# Patient Record
Sex: Male | Born: 1995 | Race: Black or African American | Hispanic: No | Marital: Single | Smoking: Former smoker
Health system: Southern US, Community
[De-identification: ages and names within clinical notes are randomized; demographics above are authoritative.]

## PROBLEM LIST (undated history)

## (undated) DIAGNOSIS — F29 Unspecified psychosis not due to a substance or known physiological condition: Secondary | ICD-10-CM

## (undated) DIAGNOSIS — R569 Unspecified convulsions: Secondary | ICD-10-CM

## (undated) DIAGNOSIS — G43909 Migraine, unspecified, not intractable, without status migrainosus: Secondary | ICD-10-CM

## (undated) DIAGNOSIS — F209 Schizophrenia, unspecified: Secondary | ICD-10-CM

## (undated) HISTORY — PX: OTHER SURGICAL HISTORY: SHX169

## (undated) HISTORY — PX: NO PAST SURGERIES: SHX2092

---

## 2014-07-10 ENCOUNTER — Emergency Department (HOSPITAL_COMMUNITY)
Admission: EM | Admit: 2014-07-10 | Discharge: 2014-07-11 | Disposition: A | Discharge status: Home / Self Care | Payer: Medicaid Other | Attending: Emergency Medicine | Admitting: Emergency Medicine

## 2014-07-10 ENCOUNTER — Encounter (HOSPITAL_COMMUNITY): Payer: Self-pay | Admitting: Emergency Medicine

## 2014-07-10 DIAGNOSIS — R0600 Dyspnea, unspecified: Secondary | ICD-10-CM | POA: Insufficient documentation

## 2014-07-10 DIAGNOSIS — R0602 Shortness of breath: Secondary | ICD-10-CM | POA: Diagnosis present

## 2014-07-10 NOTE — ED Notes (Signed)
Pt states he was smoking weed and now feels anxious. States he has smoked marijuana multiple times before but has never felt this way before. Alert and oriented.

## 2014-07-10 NOTE — ED Notes (Signed)
Bed: [id number] Expected date: 07/10/14 Expected time: 11:06 PM Means of arrival: Ambulance Comments: Feels weird after smoking marijuana

## 2014-07-11 ENCOUNTER — Emergency Department (HOSPITAL_COMMUNITY): Payer: Medicaid Other

## 2014-07-11 NOTE — Discharge Instructions (Signed)
YOUR EXAM AND CHEST X-RAY ARE NORMAL. YOU CAN BE DISCHARGED HOME AND CAN FOLLOW UP WITH YOUR DOCTOR FOR FURTHER OUTPATIENT EVALUATION IF SYMPTOMS PERSIST.

## 2014-07-11 NOTE — ED Provider Notes (Signed)
CSN: [id number]     Arrival date & time 07/10/14  2317 History   First MD Initiated Contact with Patient 07/11/14 0108     Chief Complaint  Patient presents with  . Marijuana Reaction      (Consider location/radiation/quality/duration/timing/severity/associated sxs/prior Treatment) Patient is a 18 y.o. male presenting with shortness of breath. The history is provided by the patient. No language interpreter was used.  Shortness of Breath Associated symptoms: no fever   Associated symptoms comment:  He was smoking marijuana around 6:00 pm and reports feeling SOB afterward. No chest pain, cough or fever. He states marijuana has not caused these symptoms in the past. No vomiting.    History reviewed. No pertinent past medical history. History reviewed. No pertinent past surgical history. No family history on file. History  Substance Use Topics  . Smoking status: Not on file  . Smokeless tobacco: Not on file  . Alcohol Use: Not on file    Review of Systems  Constitutional: Negative for fever and chills.  HENT: Negative.   Respiratory: Positive for shortness of breath.   Cardiovascular: Negative.   Gastrointestinal: Negative.   Musculoskeletal: Negative.   Skin: Negative.   Neurological: Negative.       Allergies  Review of patient's allergies indicates no known allergies.  Home Medications   Prior to Admission medications   Not on File   BP 106/54 mmHg  Pulse 76  Temp(Src) 97.8 F (36.6 C) (Oral)  Resp 18  Ht 5\' 11"  (1.803 m)  Wt 136 lb (61.689 kg)  BMI 18.98 kg/m2  SpO2 100% Physical Exam  Constitutional: He is oriented to person, place, and time. He appears well-developed and well-nourished.  HENT:  Head: Normocephalic.  Neck: Normal range of motion. Neck supple.  Cardiovascular: Normal rate and regular rhythm.   Pulmonary/Chest: Effort normal and breath sounds normal.  Abdominal: Soft. Bowel sounds are normal. There is no tenderness. There is no rebound  and no guarding.  Musculoskeletal: Normal range of motion.  Neurological: He is alert and oriented to person, place, and time.  Skin: Skin is warm and dry. No rash noted.  Psychiatric: He has a normal mood and affect.    ED Course  Procedures (including critical care time) Labs Review Labs Reviewed - No data to display  Imaging Review Dg Chest 2 View  07/11/2014   CLINICAL DATA:  Short of breath after smoking marijuana  EXAM: CHEST  2 VIEW  COMPARISON:  None.  FINDINGS: The heart size and mediastinal contours are within normal limits. Both lungs are clear. The visualized skeletal structures are unremarkable.  IMPRESSION: No active cardiopulmonary disease.   Electronically Signed   By: Signa Kellaylor  Stroud M.D.   On: 07/11/2014 01:57     EKG Interpretation None      MDM   Final diagnoses:  None    1. Dyspnea  The patient is seen approximately 6 hours after onset of symptoms. He reports symptoms have remained the same and are unchanged. The patient had to be woken up to obtain history. His VS are normal, no tachycardia, tachypnea or hypoxia. He is appropriate for discharge.     Arnoldo HookerShari A Sabree Nuon, PA-C 07/11/14 [phone]  Tomasita CrumbleAdeleke Oni, MD 07/11/14 1200

## 2014-07-11 NOTE — ED Notes (Signed)
Patient transported to X-ray 

## 2015-03-06 ENCOUNTER — Emergency Department (HOSPITAL_COMMUNITY)
Admission: EM | Admit: 2015-03-06 | Discharge: 2015-03-06 | Disposition: A | Discharge status: Home / Self Care | Payer: Medicaid Other | Attending: Emergency Medicine | Admitting: Emergency Medicine

## 2015-03-06 ENCOUNTER — Emergency Department (HOSPITAL_COMMUNITY): Admission: EM | Admit: 2015-03-06 | Discharge: 2015-03-06 | Discharge status: Absent without leave | Payer: Self-pay

## 2015-03-06 ENCOUNTER — Encounter (HOSPITAL_COMMUNITY): Payer: Self-pay | Admitting: *Deleted

## 2015-03-06 ENCOUNTER — Emergency Department (HOSPITAL_COMMUNITY): Payer: Medicaid Other

## 2015-03-06 DIAGNOSIS — M79671 Pain in right foot: Secondary | ICD-10-CM | POA: Insufficient documentation

## 2015-03-06 MED ORDER — KETOROLAC TROMETHAMINE 30 MG/ML IJ SOLN
30.0000 mg | Freq: Once | INTRAMUSCULAR | Status: AC
  Administered 2015-03-06: 30 mg via INTRAMUSCULAR
  Filled 2015-03-06: qty 1

## 2015-03-06 MED ORDER — HYDROCODONE-ACETAMINOPHEN 5-325 MG PO TABS: 1.0000 | ORAL_TABLET | Freq: Four times a day (QID) | ORAL | Status: DC | PRN

## 2015-03-06 NOTE — ED Notes (Signed)
Pt reports running into a "jagged" curb with his R foot earlier today.  Reports pain on top of his R foot.  Swelling noted.

## 2015-03-06 NOTE — ED Notes (Signed)
Bed: WTR7 Expected date:  Expected time:  Means of arrival:  Comments: EMS foot pain

## 2015-03-06 NOTE — ED Provider Notes (Signed)
CSN: [id number]     Arrival date & time 03/06/15  0011 History   First MD Initiated Contact with Patient 03/06/15 0015     Chief Complaint  Patient presents with  . Foot Injury     (Consider location/radiation/quality/duration/timing/severity/associated sxs/prior Treatment) HPI Comments: Pt reports running into a "jagged" curb with his R foot earlier today. Reports pain on top of his R foot.he denies falling, hitting his head, loss of consciousness or vomiting. He states his pain is worsened with ambulating or palpation. He has not tried any medications prior to arrival her symptoms. No modifying factors identified. Denies any history of previous injuries or surgeries.   Patient is a 19 y.o. male presenting with lower extremity pain. The history is provided by the patient.  Foot Pain This is a new problem. The current episode started today. The problem occurs constantly. The problem has been unchanged. Associated symptoms include joint swelling and myalgias. Pertinent negatives include no fever. The symptoms are aggravated by walking. He has tried nothing for the symptoms. The treatment provided no relief.    History reviewed. No pertinent past medical history. History reviewed. No pertinent past surgical history. No family history on file. History  Substance Use Topics  . Smoking status: Never Smoker   . Smokeless tobacco: Not on file  . Alcohol Use: No    Review of Systems  Constitutional: Negative for fever.  Musculoskeletal: Positive for myalgias and joint swelling.       + R foot pain  All other systems reviewed and are negative.     Allergies  Review of patient's allergies indicates no known allergies.  Home Medications   Prior to Admission medications   Medication Sig Start Date End Date Taking? Authorizing Provider  HYDROcodone-acetaminophen (NORCO/VICODIN) 5-325 MG per tablet Take 1-2 tablets by mouth every 6 (six) hours as needed. 03/06/15   Jamear Carbonneau,  PA-C   BP 119/64 mmHg  Pulse 68  Temp(Src) 98.7 F (37.1 C) (Oral)  Resp 16  SpO2 100% Physical Exam  Constitutional: He is oriented to person, place, and time. He appears well-developed and well-nourished. No distress.  HENT:  Head: Normocephalic and atraumatic.  Right Ear: External ear normal.  Left Ear: External ear normal.  Nose: Nose normal.  Mouth/Throat: Oropharynx is clear and moist.  Eyes: Conjunctivae are normal.  Neck: Normal range of motion. Neck supple.  No nuchal rigidity.   Cardiovascular: Normal rate, regular rhythm, normal heart sounds and intact distal pulses.   Pulmonary/Chest: Effort normal.  Abdominal: Soft. There is no tenderness.  Musculoskeletal:       Right ankle: Tenderness.       Feet:  + R foot and ankle TTP and swelling. No erythema or warmth. No laceration or deformity noted. Neurovascularly intact distal to injury. Sensation grossly intact.  Neurological: He is alert and oriented to person, place, and time.  Skin: Skin is warm and dry. He is not diaphoretic.  Psychiatric: He has a normal mood and affect.  Nursing note and vitals reviewed.   ED Course  Procedures (including critical care time) Medications  ketorolac (TORADOL) 30 MG/ML injection 30 mg (30 mg Intramuscular Given 03/06/15 0114)    Labs Review Labs Reviewed - No data to display  Imaging Review Dg Foot Complete Right  03/06/2015   CLINICAL DATA:  Lateral pain and swelling, struck foot on concrete beam today.  EXAM: RIGHT FOOT COMPLETE - 3+ VIEW  COMPARISON:  None.  FINDINGS: There is no evidence  of fracture or dislocation. There is no evidence of arthropathy or other focal bone abnormality. Dorsal foot soft tissue swelling without subcutaneous gas or radiopaque foreign bodies.  IMPRESSION: Soft tissue swelling without acute osseous process.   Electronically Signed   By: Awilda Metroourtnay  Bloomer M.D.   On: 03/06/2015 01:22     EKG Interpretation None      MDM   Final diagnoses:    Right foot pain    Filed Vitals:   03/06/15 0013  BP: 119/64  Pulse: 68  Temp: 98.7 F (37.1 C)  Resp: 16   Patient X-Ray  negative for obvious fracture or dislocation. I personally reviewed the imaging and agree with the radiologist. Neurovascularly intact. Normal sensation. No evidence of compartment syndrome. Pain managed in ED. Pt advised to follow up with PCP if symptoms persist for possibility of missed fracture diagnosis. Patient given ace wrap and crutches while in ED, conservative therapy recommended and discussed. Patient will be dc home &  is agreeable with above plan.      Francee PiccoloJennifer Eliot Popper, PA-C 03/06/15 0303  Marisa Severinlga Otter, MD 03/06/15 [phone]

## 2015-03-06 NOTE — Discharge Instructions (Signed)
Please follow up with your primary care physician in 1-2 days. If you do not have one please call the [facility] number listed above. Please take pain medication and/or muscle relaxants as prescribed and as needed for pain. Please do not drive on narcotic pain medication or on muscle relaxants. Please read all discharge instructions and return precautions.  ° ° °Ankle Pain °Ankle pain is a common symptom. The bones, cartilage, tendons, and muscles of the ankle joint perform a lot of work each day. The ankle joint holds your body weight and allows you to move around. Ankle pain can occur on either side or back of 1 or both ankles. Ankle pain may be sharp and burning or dull and aching. There may be tenderness, stiffness, redness, or warmth around the ankle. The pain occurs more often when a person walks or puts pressure on the ankle. °CAUSES  °There are many reasons ankle pain can develop. It is important to work with your caregiver to identify the cause since many conditions can impact the bones, cartilage, muscles, and tendons. Causes for ankle pain include: °· Injury, including a break (fracture), sprain, or strain often due to a fall, sports, or a high-impact activity. °· Swelling (inflammation) of a tendon (tendonitis). °· Achilles tendon rupture. °· Ankle instability after repeated sprains and strains. °· Poor foot alignment. °· Pressure on a nerve (tarsal tunnel syndrome). °· Arthritis in the ankle or the lining of the ankle. °· Crystal formation in the ankle (gout or pseudogout). °DIAGNOSIS  °A diagnosis is based on your medical history, your symptoms, results of your physical exam, and results of diagnostic tests. Diagnostic tests may include X-ray exams or a computerized magnetic scan (magnetic resonance imaging, MRI). °TREATMENT  °Treatment will depend on the cause of your ankle pain and may include: °· Keeping pressure off the ankle and limiting activities. °· Using crutches or other  walking support (a cane or brace). °· Using rest, ice, compression, and elevation. °· Participating in physical therapy or home exercises. °· Wearing shoe inserts or special shoes. °· Losing weight. °· Taking medications to reduce pain or swelling or receiving an injection. °· Undergoing surgery. °HOME CARE INSTRUCTIONS  °· Only take over-the-counter or prescription medicines for pain, discomfort, or fever as directed by your caregiver. °· Put ice on the injured area. °¨ Put ice in a plastic bag. °¨ Place a towel between your skin and the bag. °¨ Leave the ice on for 15-20 minutes at a time, 03-04 times a day. °· Keep your leg raised (elevated) when possible to lessen swelling. °· Avoid activities that cause ankle pain. °· Follow specific exercises as directed by your caregiver. °· Record how often you have ankle pain, the location of the pain, and what it feels like. This information may be helpful to you and your caregiver. °· Ask your caregiver about returning to work or sports and whether you should drive. °· Follow up with your caregiver for further examination, therapy, or testing as directed. °SEEK MEDICAL CARE IF:  °· Pain or swelling continues or worsens beyond 1 week. °· You have an oral temperature above 102° F (38.9° C). °· You are feeling unwell or have chills. °· You are having an increasingly difficult time with walking. °· You have loss of sensation or other new symptoms. °· You have questions or concerns. °MAKE SURE YOU:  °· Understand these instructions. °· Will watch your condition. °· Will get help right away if you   are not doing well or get worse. °Document Released: 01/22/2010 Document Revised: 10/27/2011 Document Reviewed: 01/22/2010 °ExitCare® Patient Information ©2015 [name]. This information is not intended to replace advice given to you by your health care provider. Make sure you discuss any questions you have with your health care provider. ° ° ° °

## 2015-04-10 ENCOUNTER — Emergency Department (HOSPITAL_COMMUNITY)
Admission: EM | Admit: 2015-04-10 | Discharge: 2015-04-10 | Disposition: A | Discharge status: Home / Self Care | Payer: Medicaid Other | Attending: Emergency Medicine | Admitting: Emergency Medicine

## 2015-04-10 ENCOUNTER — Emergency Department (HOSPITAL_COMMUNITY): Payer: Medicaid Other

## 2015-04-10 ENCOUNTER — Encounter (HOSPITAL_COMMUNITY): Payer: Self-pay | Admitting: Emergency Medicine

## 2015-04-10 DIAGNOSIS — F458 Other somatoform disorders: Secondary | ICD-10-CM | POA: Insufficient documentation

## 2015-04-10 HISTORY — DX: Unspecified convulsions: R56.9

## 2015-04-10 MED ORDER — DIAZEPAM 5 MG PO TABS
5.0000 mg | ORAL_TABLET | Freq: Once | ORAL | Status: AC
  Administered 2015-04-10: 5 mg via ORAL
  Filled 2015-04-10: qty 1

## 2015-04-10 MED ORDER — LIDOCAINE VISCOUS 2 % MT SOLN
5.0000 mL | Freq: Once | OROMUCOSAL | Status: AC
  Administered 2015-04-10: 5 mL via OROMUCOSAL
  Filled 2015-04-10: qty 15

## 2015-04-10 NOTE — Discharge Instructions (Signed)
Globus Syndrome  Globus Syndrome is a feeling of a lump or a sensation of something caught in your throat. Eating food or drinking fluids does not seem to get rid of it. Yet it is not noticeable during the actual act of swallowing food or liquids. Usually there is nothing physically wrong. It is troublesome because it is an unpleasant sensation which is sometimes difficult to ignore and at times may seem to worsen. The syndrome is quite common. It is estimated 45% of the population experiences features of the condition at some stage during their lives. The symptoms are usually temporary. The largest group of people who feel the need to seek medical treatment is females between the ages of 30 to 60.   CAUSES   Globus Syndrome appears to be triggered by or aggravated by stress, anxiety and depression.  · Tension related to stress could product abnormal muscle spasms in the esophagus which would account for the sensation of a lump or ball in your throat.  · Frequent swallowing or drying of the throat caused by anxiety or other strong emotions can also produce this uncomfortable sensation in your throat.  · Fear and sadness can be expressed by the body in many ways. For instance, if you had a relative with throat cancer you might become overly concerned about your own health and develop uncomfortable sensations in your throat.  · The reaction to a crisis or a trauma event in your life can take the form of a lump in your throat. It is as if you are indirectly saying you can not handle or "swallow" one more thing.  DIAGNOSIS   Usually your caregiver will know what is wrong by talking to you and examining you.  If the condition persists for several days, more testing may be done to make sure there is not another problem present. This is usually not the case.  TREATMENT   · Reassurance is often the best treatment available. Usually the problem leaves without treatment over several days.  · Sometimes anti-anxiety medications  may be prescribed.  · Counseling or talk therapy can also help with strong underlying emotions.  · Note that in most cases this is not something that keeps coming back and you should not be concerned or worried.  Document Released: 10/25/2003 Document Revised: 10/27/2011 Document Reviewed: 03/23/2008  ExitCare® Patient Information ©2015 [name]. This information is not intended to replace advice given to you by your health care provider. Make sure you discuss any questions you have with your health care provider.

## 2015-04-10 NOTE — ED Notes (Signed)
Bed: WLPT2 Expected date:  Expected time:  Means of arrival:  Comments: 12M Throat pain
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
## 2015-09-23 ENCOUNTER — Encounter (HOSPITAL_COMMUNITY): Payer: Self-pay | Admitting: Emergency Medicine

## 2015-09-23 ENCOUNTER — Emergency Department (HOSPITAL_COMMUNITY): Payer: Self-pay

## 2015-09-23 ENCOUNTER — Emergency Department (HOSPITAL_COMMUNITY)
Admission: EM | Admit: 2015-09-23 | Discharge: 2015-09-25 | Disposition: A | Discharge status: Other Acute Inpatient Hospital | Payer: Federal, State, Local not specified - Other

## 2015-09-23 DIAGNOSIS — R410 Disorientation, unspecified: Secondary | ICD-10-CM | POA: Insufficient documentation

## 2015-09-23 DIAGNOSIS — F22 Delusional disorders: Secondary | ICD-10-CM | POA: Insufficient documentation

## 2015-09-23 DIAGNOSIS — F209 Schizophrenia, unspecified: Secondary | ICD-10-CM

## 2015-09-23 DIAGNOSIS — F29 Unspecified psychosis not due to a substance or known physiological condition: Secondary | ICD-10-CM | POA: Insufficient documentation

## 2015-09-23 LAB — COMPREHENSIVE METABOLIC PANEL
ALT: 18 U/L (ref 17–63)
AST: 30 U/L (ref 15–41)
Albumin: 4.4 g/dL (ref 3.5–5.0)
Alkaline Phosphatase: 77 U/L (ref 38–126)
Anion gap: 9 (ref 5–15)
BUN: 32 mg/dL — AB (ref 6–20)
CHLORIDE: 108 mmol/L (ref 101–111)
CO2: 25 mmol/L (ref 22–32)
CREATININE: 0.94 mg/dL (ref 0.61–1.24)
Calcium: 9.6 mg/dL (ref 8.9–10.3)
GFR calc Af Amer: >60 mL/min (ref >60)
Glucose, Bld: 78 mg/dL (ref 65–99)
POTASSIUM: 4 mmol/L (ref 3.5–5.1)
SODIUM: 142 mmol/L (ref 135–145)
Total Bilirubin: 0.8 mg/dL (ref 0.3–1.2)
Total Protein: 7.8 g/dL (ref 6.5–8.1)

## 2015-09-23 LAB — CBC
HCT: 40.5 % (ref 39.0–52.0)
HEMOGLOBIN: 13.9 g/dL (ref 13.0–17.0)
MCH: 28.8 pg (ref 26.0–34.0)
MCHC: 34.3 g/dL (ref 30.0–36.0)
MCV: 83.9 fL (ref 78.0–100.0)
PLATELETS: 178 10*3/uL (ref 150–400)
RBC: 4.83 MIL/uL (ref 4.22–5.81)
RDW: 13.4 % (ref 11.5–15.5)
WBC: 5.2 10*3/uL (ref 4.0–10.5)

## 2015-09-23 LAB — RAPID URINE DRUG SCREEN, HOSP PERFORMED
AMPHETAMINES: NOT DETECTED
BENZODIAZEPINES: NOT DETECTED
Barbiturates: NOT DETECTED
Cocaine: NOT DETECTED
OPIATES: NOT DETECTED
TETRAHYDROCANNABINOL: NOT DETECTED

## 2015-09-23 LAB — ETHANOL

## 2015-09-23 LAB — ACETAMINOPHEN LEVEL: Acetaminophen (Tylenol), Serum: <10 ug/mL — ABNORMAL LOW (ref 10–30)

## 2015-09-23 LAB — SALICYLATE LEVEL
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
## 2015-09-23 NOTE — ED Notes (Signed)
Pt not able to follow directions.
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
## 2015-09-23 NOTE — ED Notes (Signed)
Pt acting abnormally. Right hand is edematous and erythematous. Pt is disoriented, poor attention span, obsessed with his belt and swollen hand, tells RN that his family member owns record companies. Denies SI/HI/AVH. Denies use of drugs. Repeatedly states "what the heck did they do to me?" Appears fascinated by edematous hand.

## 2015-09-23 NOTE — ED Notes (Signed)
TTS recommendation is inpatient treatment. [name] does not currently have beds, so TTS will seek placement elsewhere for pt

## 2015-09-23 NOTE — ED Notes (Signed)
TTS consult in process. 

## 2015-09-24 DIAGNOSIS — F209 Schizophrenia, unspecified: Secondary | ICD-10-CM | POA: Diagnosis not present

## 2015-09-24 MED ORDER — HALOPERIDOL 5 MG PO TABS
5.0000 mg | ORAL_TABLET | Freq: Two times a day (BID) | ORAL | Status: DC
  Administered 2015-09-24 – 2015-09-25 (×3): 5 mg via ORAL
  Filled 2015-09-24 (×3): qty 1

## 2015-09-24 MED ORDER — BENZTROPINE MESYLATE 1 MG PO TABS
1.0000 mg | ORAL_TABLET | Freq: Two times a day (BID) | ORAL | Status: DC
  Administered 2015-09-24 – 2015-09-25 (×3): 1 mg via ORAL
  Filled 2015-09-24 (×3): qty 1

## 2015-09-24 MED ORDER — OXCARBAZEPINE 300 MG PO TABS
300.0000 mg | ORAL_TABLET | Freq: Two times a day (BID) | ORAL | Status: DC
  Administered 2015-09-24 – 2015-09-25 (×3): 300 mg via ORAL
  Filled 2015-09-24 (×3): qty 1

## 2015-09-24 NOTE — Progress Notes (Signed)
CM spoke with pt who confirms uninsured [employer] resident with no pcp.  CM discussed and provided written information for uninsured accepting pcps, discussed the importance of pcp vs EDP services for f/u care, www.needymeds.org, www.goodrx.com, discounted pharmacies and other [employer] such as [employer] , [employer], affordable care act, financial assistance, uninsured dental services, [place] med assist, DSS and  health department  Reviewed resources for [employer] uninsured accepting pcps like Risk analyst, family medicine at [employer] [address] street, [facility], [facility], local urgent care centers, [facility], [facility] family practice, general medical clinics, family services of the [place], [facility] urgent care plus others, medication resources, [facility] out patient pharmacies and housing Pt voiced understanding and appreciation of resources provided   Provided [employer] contact information Encouraged use of IRC/urban ministries  Pt had not eaten his lunch Cm encouraged him to eat so his tray would not be taken away (limit of time food can remain sitting out) Pt sat up in bed to get his food to eat

## 2015-09-24 NOTE — Progress Notes (Signed)
Entered in d/c instructions Please use the resources provided to you in emergency room by case manager to assist with doctor for follow up These [place] uninsured resources provide possible primary care providers, resources for discounted medications, housing, dental resources, affordable care act information, plus other resources for [employer]  [name]) [address] [place], [place] [address] [phone] by 2 pm Monday-Friday & sign in at the front desk. Come if you need showers, laundry, computer access, job counseling, resume help, referrals for food/clothing, Mental Health assist, housing

## 2015-09-24 NOTE — ED Notes (Signed)
Pt awake, alert & responsive, no distress noted.  Eating dinner at present.  Monitoring for safety, Q 15 min checks in effect.

## 2015-09-24 NOTE — Progress Notes (Signed)
D Pt. Denies SI and HI, with a shake of his head,  Pt. Also denies pain or discomfort. Pt. Appeared puzzled when writer ask if he was hearing any voices or seeing anything. Pt. Did not respond to these questions even when explained and repeated. Pt. Only responded verbally to wanting food.  A Writer offered support and encouragement.  R Pt. Put the food in bed with him, he did not want staff to assist with opening it.  Pt. Has a strong smell of feces on his clothing that we locked up.  Pt.  Went to sleep soon after moving onto the unit.  Will continue to monitor to ensure pt. Safety.
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
## 2015-09-24 NOTE — BH Assessment (Signed)
[name] Assessment Progress Note  The following facilities have been contacted to seek placement for this pt, with results as noted:  Beds available, information sent, decision pending:  [facility]   At capacity:  [facility]   Sintia Mckissic, MA Triage Specialist [phone]     

## 2015-09-25 ENCOUNTER — Encounter (HOSPITAL_COMMUNITY): Payer: Self-pay

## 2015-09-25 ENCOUNTER — Inpatient Hospital Stay (HOSPITAL_COMMUNITY)
Admission: AD | Admit: 2015-09-25 | Discharge: 2015-10-02 | DRG: 885 | Disposition: A | Discharge status: Home / Self Care | Payer: Federal, State, Local not specified - Other | Source: Intra-hospital | Attending: Psychiatry | Admitting: Psychiatry

## 2015-09-25 DIAGNOSIS — R4183 Borderline intellectual functioning: Secondary | ICD-10-CM

## 2015-09-25 DIAGNOSIS — F200 Paranoid schizophrenia: Secondary | ICD-10-CM | POA: Diagnosis present

## 2015-09-25 DIAGNOSIS — F209 Schizophrenia, unspecified: Principal | ICD-10-CM | POA: Diagnosis present

## 2015-09-25 DIAGNOSIS — F411 Generalized anxiety disorder: Secondary | ICD-10-CM | POA: Diagnosis present

## 2015-09-25 DIAGNOSIS — F29 Unspecified psychosis not due to a substance or known physiological condition: Secondary | ICD-10-CM | POA: Diagnosis present

## 2015-09-25 DIAGNOSIS — Z8659 Personal history of other mental and behavioral disorders: Secondary | ICD-10-CM

## 2015-09-25 DIAGNOSIS — G4700 Insomnia, unspecified: Secondary | ICD-10-CM | POA: Diagnosis present

## 2015-09-25 DIAGNOSIS — Z590 Homelessness: Secondary | ICD-10-CM

## 2015-09-25 DIAGNOSIS — F1221 Cannabis dependence, in remission: Secondary | ICD-10-CM | POA: Clinically undetermined

## 2015-09-25 DIAGNOSIS — F4310 Post-traumatic stress disorder, unspecified: Secondary | ICD-10-CM | POA: Diagnosis present

## 2015-09-25 MED ORDER — MAGNESIUM HYDROXIDE 400 MG/5ML PO SUSP: 30.0000 mL | Freq: Every day | ORAL | Status: DC | PRN

## 2015-09-25 MED ORDER — BENZTROPINE MESYLATE 1 MG PO TABS
1.0000 mg | ORAL_TABLET | Freq: Two times a day (BID) | ORAL | Status: DC
  Administered 2015-09-25 – 2015-09-29 (×8): 1 mg via ORAL
  Filled 2015-09-25 (×14): qty 1

## 2015-09-25 MED ORDER — HALOPERIDOL 5 MG PO TABS
5.0000 mg | ORAL_TABLET | Freq: Two times a day (BID) | ORAL | Status: DC
  Administered 2015-09-25 – 2015-09-29 (×8): 5 mg via ORAL
  Filled 2015-09-25 (×14): qty 1

## 2015-09-25 MED ORDER — HYDROXYZINE HCL 25 MG PO TABS: 25.0000 mg | ORAL_TABLET | Freq: Three times a day (TID) | ORAL | Status: DC | PRN

## 2015-09-25 MED ORDER — OXCARBAZEPINE 300 MG PO TABS
300.0000 mg | ORAL_TABLET | Freq: Two times a day (BID) | ORAL | Status: DC
  Administered 2015-09-26 – 2015-10-02 (×13): 300 mg via ORAL
  Filled 2015-09-25 (×8): qty 1
  Filled 2015-09-25 (×2): qty 14
  Filled 2015-09-25 (×9): qty 1

## 2015-09-25 MED ORDER — ACETAMINOPHEN 325 MG PO TABS: 650.0000 mg | ORAL_TABLET | Freq: Four times a day (QID) | ORAL | Status: DC | PRN

## 2015-09-25 MED ORDER — ALUM & MAG HYDROXIDE-SIMETH 200-200-20 MG/5ML PO SUSP
30.0000 mL | ORAL | Status: DC | PRN
Start: 2015-09-25 — End: 2015-10-02

## 2015-09-25 MED ORDER — HYDROXYZINE HCL 25 MG PO TABS
25.0000 mg | ORAL_TABLET | Freq: Three times a day (TID) | ORAL | Status: DC | PRN
  Filled 2015-09-25: qty 10
  Filled 2015-09-25: qty 1

## 2015-09-25 NOTE — ED Notes (Signed)
[name]  Left the unit ambulatory with [employer].  All belongings given to the driver.  Patient is more oriented today and interacting a bit more.  Taking medications as prescribed.  Appetite is good.
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
## 2015-09-25 NOTE — Progress Notes (Signed)
D:Patient in bed on approach.  Patient states he was tired and wanted to sleep.  Patient denies SI/HI and denies AVH during assessment.  Patient states he is feeling a little better than he felt when he came in.  Patient did not engage in conversation with Clinical research associate. A: Staff to monitor Q 15 mins for safety.  Encouragement and support offered. No scheduled medications administered per orders. R: Patient remains safe on the unit.  Patient did not attend group tonight.  Patient not visible on the unit tonight.  Patient had not medications to be administered.

## 2015-09-25 NOTE — BH Assessment (Signed)
[name] Assessment Progress Note  Per Thedore Mins, MD, this pt requires psychiatric hospitalization at this time.  Rosey Bath, RN, [facility] has assigned pt to [facility] Rm 502-1.  Pt has signed Voluntary Admission and Consent for Treatment, as well as Consent to Release Information to no one, and signed forms have been faxed to [facility].  Pt's nurse has been notified, and agrees to send original paperwork along with pt via [place], and to call report to [phone].  Doylene Canning, MA Triage Specialist [phone]

## 2015-09-25 NOTE — Progress Notes (Signed)
Did not attend group 

## 2015-09-25 NOTE — Tx Team (Signed)
Initial Interdisciplinary Treatment Plan   PATIENT STRESSORS: Marital or family conflict Medication change or noncompliance homelessness   PATIENT STRENGTHS: Physical Health   PROBLEM LIST: Problem List/Patient Goals Date to be addressed Date deferred Reason deferred Estimated date of resolution  "I hear voices stating we got you now"      Patient has paranoid thoughts.                                                  DISCHARGE CRITERIA:  Improved stabilization in mood, thinking, and/or behavior Motivation to continue treatment in a less acute level of care  PRELIMINARY DISCHARGE PLAN: Placement in alternative living arrangements  PATIENT/FAMIILY INVOLVEMENT: This treatment plan has been presented to and reviewed with the patient, Collin White, and/or family member The patient and family have been given the opportunity to ask questions and make suggestions.  [name] 09/25/2015, 5:28 PM

## 2015-09-26 ENCOUNTER — Encounter (HOSPITAL_COMMUNITY): Payer: Self-pay | Admitting: Psychiatry

## 2015-09-26 DIAGNOSIS — F29 Unspecified psychosis not due to a substance or known physiological condition: Secondary | ICD-10-CM | POA: Diagnosis not present

## 2015-09-26 DIAGNOSIS — R4183 Borderline intellectual functioning: Secondary | ICD-10-CM | POA: Diagnosis not present

## 2015-09-26 DIAGNOSIS — F1290 Cannabis use, unspecified, uncomplicated: Secondary | ICD-10-CM | POA: Diagnosis not present

## 2015-09-26 DIAGNOSIS — Z8659 Personal history of other mental and behavioral disorders: Secondary | ICD-10-CM

## 2015-09-26 DIAGNOSIS — F209 Schizophrenia, unspecified: Secondary | ICD-10-CM | POA: Diagnosis not present

## 2015-09-26 DIAGNOSIS — F1221 Cannabis dependence, in remission: Secondary | ICD-10-CM | POA: Clinically undetermined
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
## 2015-09-26 NOTE — BHH Suicide Risk Assessment (Signed)
[name] Admission Suicide Risk Assessment   Nursing information obtained from:    Demographic factors:    Current Mental Status:    Loss Factors:    Historical Factors:    Risk Reduction Factors:     Total Time spent with patient: 30 minutes Principal Problem: Psychosis Diagnosis:   Patient Active Problem List   Diagnosis Date Noted  . Cannabis use disorder, moderate, in early remission [F12.90] 09/26/2015  . Psychosis [F29] 09/25/2015   Subjective Data: Please see H&P.   Continued Clinical Symptoms:  Alcohol Use Disorder Identification Test Final Score (AUDIT): 0 The "Alcohol Use Disorders Identification Test", Guidelines for Use in Primary Care, Second Edition.  World Science writer [facility] [employer]). Score between 0-7:  no or low risk or alcohol related problems. Score between 8-15:  moderate risk of alcohol related problems. Score between 16-19:  high risk of alcohol related problems. Score 20 or above:  warrants further diagnostic evaluation for alcohol dependence and treatment.   CLINICAL FACTORS:   Alcohol/Substance Abuse/Dependencies Previous Psychiatric Diagnoses and Treatments   Musculoskeletal: Strength & Muscle Tone: within normal limits Gait & Station: normal Patient leans: N/A  Psychiatric Specialty Exam: Review of Systems  Psychiatric/Behavioral: The patient is nervous/anxious.   All other systems reviewed and are negative.   Blood pressure 113/76, pulse 73, temperature 98.3 F (36.8 C), temperature source Oral, resp. rate 20, height 6' (1.829 m), weight 66.225 kg (146 lb), SpO2 100 %.Body mass index is 19.8 kg/(m^2).                      Please see H&P.                                   COGNITIVE FEATURES THAT CONTRIBUTE TO RISK:  Closed-mindedness, Polarized thinking and Thought constriction (tunnel vision)    SUICIDE RISK:   Mild:  Suicidal ideation of limited frequency, intensity, duration, and specificity.  There are no  identifiable plans, no associated intent, mild dysphoria and related symptoms, good self-control (both objective and subjective assessment), few other risk factors, and identifiable protective factors, including available and accessible social support.  PLAN OF CARE: Please see H&P.   I certify that inpatient services furnished can reasonably be expected to improve the patient's condition.   Irine Heminger, MD 09/26/2015, 11:55 AM

## 2015-09-26 NOTE — BHH Suicide Risk Assessment (Signed)
[name] INPATIENT:  Family/Significant Other Suicide Prevention Education  Suicide Prevention Education:  Patient Refusal for Family/Significant Other Suicide Prevention Education: The patient Collin White has refused to provide written consent for family/significant other to be provided Family/Significant Other Suicide Prevention Education during admission and/or prior to discharge.  Physician notified.  Jonathon Jordan 09/26/2015, 4:43 PM

## 2015-09-26 NOTE — Progress Notes (Signed)
Adult Psychoeducational Group Note  Date:  09/26/2015 Time:  9:12 PM  Group Topic/Focus:  Wrap-Up Group:   The focus of this group is to help patients review their daily goal of treatment and discuss progress on daily workbooks.  Participation Level:  Did Not Attend  Participation Quality:  Did not attend  Affect:  Did not attend  Cognitive:  Did not attend  Insight: None  Engagement in Group:  Did not attend  Modes of Intervention:  Did not attend  Additional Comments:  Patient did not attend wrap up group tonight.   Emric Kowalewski L Hildy Nicholl 09/26/2015, 9:12 PM
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
## 2015-09-26 NOTE — Tx Team (Signed)
Interdisciplinary Treatment Plan Update (Adult)  Date:  09/26/2015 Time Reviewed:  4:36 PM  Progress in Treatment: Attending groups: Yes. Participating in groups: Yes. Taking medication as prescribed:  Yes. Tolerating medication:  Yes. Family/Significant othe contact made:  Pt declined to sign release Patient understands diagnosis:  No, limited insight. Discussing patient identified problems/goals with staff:  Yes, see initial care plan. Medical problems stabilized or resolved:  Yes Denies suicidal/homicidal ideation: Yes. Issues/concerns per patient self-inventory: No. Other:  New problem(s) identified:   Discharge Plan or Barriers: See below   Reason for Continuation of Hospitalization: Delusions  Hallucinations Medication stabilization  Comments:  Patient was admitted due to delusional thinking, believing that people were injecting him with feces. Patient endorses positive auditory hallucinations and hears voices that state "we are going to get you" Patient also reports having moments of rage where he can not control himself and stated that those occurred even in childhood. Patient currently denies SI and HI and is able to contract for safety. Patient received and agreed to unit treatment plan agreement and was pleasant and cooperative on assessment. Patient quickly acclimated to unit.       Cogentin, Haldol, Trileptal trial   Estimated length of stay: 4-5 days  New goal(s):  Review of initial/current patient goals per problem list:  1. Goal(s): Patient will participate in aftercare plan  Met: No   Target date: at discharge  As evidenced by: Patient will participate within aftercare plan AEB aftercare provider and housing plan at discharge being identified. 09/26/15: Pt will follow-up outpt with [name] but housing is still unknown.  Declined to sign up for [facility] program when visited today.   4. Goal(s): Patient will demonstrate decreased signs of  psychosis.  Met: No  Target date:at discharge  As evidenced by: Patient will demonstrate decreased signs of psychosis as evidenced by a reduction in AVH, paranoia, and/or delusions.   09/26/15: Pt  Exhibits paranoia and  disorganized and delusional thinking.     Attendees: Patient:  09/26/2015 4:36 PM  Family:   09/26/2015 4:36 PM  Physician:  Dr. Ursula Alert, MD 09/26/2015 4:36 PM  Nursing:   Manuella Ghazi, RN 09/26/2015 4:36 PM  Case Manager:  Roque Lias, LCSW 09/26/2015 4:36 PM  Counselor:  Matthew Saras, MSW Intern 09/26/2015 4:36 PM  Other:   09/26/2015 4:36 PM  Other:   09/26/2015 4:36 PM  Other:   09/26/2015 4:36 PM  Other:  09/26/2015 4:36 PM  Other:    Other:    Other:    Other:    Other:    Other:      Scribe for Treatment Team:   Georga Kaufmann, MSW Intern 09/26/2015 4:36 PM

## 2015-09-26 NOTE — Progress Notes (Signed)
D. Pt presents with depressed mood, affect blunted. Collin White was not forthcoming of thoughts today with writer, giving minimal one word answers but he states his '' mood is ok '' and denies any SI/HI/A/V Hallucinations. He denied any acute concerns for writer to address with treatment team . He has been isolative to room with minimal interaction of staff or peers. A. Medications given as ordered. R. Pt is safe, in no acute distress at this time. Remains on q 15 minutes safety chk as ordered.

## 2015-09-26 NOTE — Progress Notes (Signed)
Patient ID: Collin White, male   DOB: 1995/12/30, 20 y.o.   MRN: [id number] D: Client in bed most of the shift, but up for snacks. Client reports "doing good, everything good" A: Writer provided emotional support, encouraged client to report any concerns and to attend group. Staff will monitor q59min for safety. R: Client is safe on the unit, did not attend group.

## 2015-09-26 NOTE — Plan of Care (Signed)
Problem: Ineffective individual coping Goal: LTG: Patient will report a decrease in negative feelings Outcome: Progressing Pt interacting some on coping , reports mood is improving.  Comments:  Pt denies any thoughts of harming self.

## 2015-09-26 NOTE — [facility] Group Notes (Signed)
[name] LCSW Group Therapy  09/26/2015 1:52 PM  Type of Therapy: Group Therapy  Participation Level: Active  Participation Quality: Attentive  Affect: Flat  Cognitive: Oriented  Insight: Limited  Engagement in Therapy: Engaged  Modes of Intervention: Discussion and Socialization  Summary of Progress/Problems: Onalee Hua from [employer] was here to tell his story of recovery and play his guitar. Pt nodded off to sleep a couple times but stayed for the entire group. Pt's comments were helpful to the toic and appropriate.   Vito Backers. Beverely Pace 09/26/2015 1:52 PM

## 2015-09-27 DIAGNOSIS — F29 Unspecified psychosis not due to a substance or known physiological condition: Secondary | ICD-10-CM | POA: Diagnosis not present

## 2015-09-27 DIAGNOSIS — F1290 Cannabis use, unspecified, uncomplicated: Secondary | ICD-10-CM | POA: Diagnosis not present

## 2015-09-27 DIAGNOSIS — R4183 Borderline intellectual functioning: Secondary | ICD-10-CM | POA: Diagnosis not present

## 2015-09-27 LAB — URINALYSIS, ROUTINE W REFLEX MICROSCOPIC
BILIRUBIN URINE: NEGATIVE
GLUCOSE, UA: NEGATIVE mg/dL
Hgb urine dipstick: NEGATIVE
KETONES UR: NEGATIVE mg/dL
LEUKOCYTES UA: NEGATIVE
NITRITE: NEGATIVE
PH: 5.5 (ref 5.0–8.0)
PROTEIN: NEGATIVE mg/dL
Specific Gravity, Urine: 1.034 — ABNORMAL HIGH (ref 1.005–1.030)

## 2015-09-27 LAB — TSH: TSH: 1.538 u[IU]/mL (ref 0.350–4.500)

## 2015-09-27 LAB — URINE MICROSCOPIC-ADD ON
RBC / HPF: NONE SEEN RBC/hpf (ref 0–5)
WBC, UA: NONE SEEN WBC/hpf (ref 0–5)

## 2015-09-27 LAB — LIPID PANEL
Cholesterol: 162 mg/dL (ref 0–200)
HDL: 60 mg/dL (ref >40)
LDL CALC: 76 mg/dL (ref 0–99)
TRIGLYCERIDES: 132 mg/dL (ref <150)
Total CHOL/HDL Ratio: 2.7 RATIO
VLDL: 26 mg/dL (ref 0–40)

## 2015-09-27 LAB — VITAMIN B12: Vitamin B-12: 532 pg/mL (ref 180–914)

## 2015-09-27 LAB — RPR: RPR: NONREACTIVE

## 2015-09-27 LAB — FOLATE: FOLATE: 6 ng/mL (ref >5.9)
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
## 2015-09-27 NOTE — Progress Notes (Signed)
DAR NOTE: Patient is calm and pleasant on approach.  Affect and mood is bright.  Tangential in speech.  Denies pain, auditory and visual hallucinations.  Rates depression at 0, hopelessness at 0, and anxiety at 0.  Describes energy level as normal and concentration as good.  Maintained on routine safety checks.  Medications given as prescribed.  Support and encouragement offered as needed.  Attended group and participated.   Patient observed socializing with peers in the dayroom.  Offered no complaint.

## 2015-09-27 NOTE — Progress Notes (Signed)
Patient ID: Collin White, male   DOB: Oct 01, 1995, 20 y.o.   MRN: [id number] D: Client visible on the unit, in dayroom watching TV. Client reports "I'm better now, when I first came in I couldn't talk fully, my hand was swollen (point to right hand), but it's now swollen now" "now I'm fine" A:Writer provided emotional support encouraged client to report any concerns to staff. Client refused medications for sleep. Staff will monitor q3min for safety. R: Client is safe on the unit, attended group.

## 2015-09-27 NOTE — [facility] Group Notes (Signed)
[name] Group Notes:  (Nursing/MHT/Case Management/Adjunct)  Date:  09/27/2015    Time:  0930 Type of Therapy:  Nurse Education  Participation Level:  Active  Participation Quality:  Appropriate and Attentive  Affect:  Appropriate  Cognitive:  Alert and Appropriate  Insight:  Appropriate and Good  Engagement in Group:  Engaged  Modes of Intervention:  Activity, Discussion, Education and Exploration  Summary of Progress/Problems: Topic was on leisure and lifestyle changes. Discussed the importance of choosing a healthy leisure activities. Group encouraged to surround themselves with positive and healthy group/support system when changing to a healthy lifestyle. Patient was receptive and contributed.    Mickie Bail 09/27/2015, 12:11 PM

## 2015-09-27 NOTE — Progress Notes (Signed)
Adult Psychoeducational Group Note  Date:  09/27/2015 Time:  8:35 PM  Group Topic/Focus:  Wrap-Up Group:   The focus of this group is to help patients review their daily goal of treatment and discuss progress on daily workbooks.  Participation Level:  Active  Participation Quality:  Appropriate  Affect:  Appropriate  Cognitive:  Alert  Insight: Appropriate  Engagement in Group:  Engaged  Modes of Intervention:  Discussion  Additional Comments:  Patient stated having a good day. Patient stated his hand is not swollen anymore. Patient stated something positive that happened today is "I am balanced now".  Monai Hindes L Avalyn Molino 09/27/2015, 8:35 PM
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
## 2015-09-28 ENCOUNTER — Inpatient Hospital Stay (HOSPITAL_COMMUNITY): Payer: Federal, State, Local not specified - Other

## 2015-09-28 DIAGNOSIS — R4183 Borderline intellectual functioning: Secondary | ICD-10-CM | POA: Diagnosis not present

## 2015-09-28 DIAGNOSIS — F29 Unspecified psychosis not due to a substance or known physiological condition: Secondary | ICD-10-CM | POA: Diagnosis not present

## 2015-09-28 DIAGNOSIS — F1290 Cannabis use, unspecified, uncomplicated: Secondary | ICD-10-CM | POA: Diagnosis not present

## 2015-09-28 LAB — HEMOGLOBIN A1C
Hgb A1c MFr Bld: 5.6 % (ref 4.8–5.6)
Mean Plasma Glucose: 114 mg/dL

## 2015-09-28 LAB — PROLACTIN: PROLACTIN: 32.6 ng/mL — AB (ref 4.0–15.2)

## 2015-09-28 NOTE — [facility] Group Notes (Signed)
[name] LCSW Group Therapy  09/28/2015  1:05 PM  Type of Therapy:  Group therapy  Participation Level:  Active  Participation Quality:  Attentive  Affect:  Flat  Cognitive:  Oriented  Insight:  Limited  Engagement in Therapy:  Limited  Modes of Intervention:  Discussion, Socialization  Summary of Progress/Problems:  Chaplain was here to lead a group on themes of hope and courage."Hope is taking a step with your right foot, and then your left."  Continued to expand on this theme, which was impossible to follow.  When asked what his hope is nbo, he replied that he wants to see his family in [place].  When asked who that was, he said "my lady."  Unable to name any other family members, even when prompted about examples like brothers, sisters, aunts, uncles. Daryel Gerald B 09/28/2015 1:25 PM

## 2015-09-28 NOTE — Progress Notes (Signed)
Adult Psychoeducational Group Note  Date:  09/28/2015 Time:  9:00 PM  Group Topic/Focus:  Wrap-Up Group:   The focus of this group is to help patients review their daily goal of treatment and discuss progress on daily workbooks.  Participation Level:  Active  Participation Quality:  Appropriate  Affect:  Appropriate  Cognitive:  Alert  Insight: Appropriate  Engagement in Group:  Engaged  Modes of Intervention:  Discussion  Additional Comments:  Patient goal for today was to focus on his discharge plan. On a scale between 1-10, (1=worst, 10=best) patient rated his day a 10.   Collin White 09/28/2015, 9:00 PM

## 2015-09-28 NOTE — [facility] Group Notes (Signed)
[facility] LCSW Aftercare Discharge Planning Group Note   09/28/2015 10:03 AM  Participation Quality:  Engaged  Mood/Affect:  Appropriate  Depression Rating:    Anxiety Rating:    Thoughts of Suicide:  No Will you contract for safety?   NA  Current AVH:  NA  Plan for Discharge/Comments:  Denies all symptoms today.  Not agitating for discharge.  Unable to identify any specific discharge plan.  Transportation Means:   Supports:  Daryel Gerald B

## 2015-09-28 NOTE — Progress Notes (Signed)
DAR NOTE: Patient remained calm and pleasant on approach.  Denies pain, auditory and visual hallucinations.  Rates depression at 0 hopelessness at 0, and anxiety at 0.  Describes energy level as normal and concentration as good.  Maintained on routine safety checks.  Medications given as prescribed.  Support and encouragement offered as needed.  Attended group and participated.  Patient observed socializing with peers in the dayroom.  Offered no complaint.  Minimal interaction with staff and peers.
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
## 2015-09-29 DIAGNOSIS — F209 Schizophrenia, unspecified: Principal | ICD-10-CM

## 2015-09-29 DIAGNOSIS — F200 Paranoid schizophrenia: Secondary | ICD-10-CM | POA: Diagnosis present

## 2015-09-29 MED ORDER — HALOPERIDOL 5 MG PO TABS
5.0000 mg | ORAL_TABLET | Freq: Every day | ORAL | Status: DC
  Administered 2015-09-30 – 2015-10-02 (×3): 5 mg via ORAL
  Filled 2015-09-29 (×4): qty 1
  Filled 2015-09-29: qty 21

## 2015-09-29 MED ORDER — BENZTROPINE MESYLATE 1 MG PO TABS
1.0000 mg | ORAL_TABLET | Freq: Every day | ORAL | Status: DC
  Administered 2015-09-29 – 2015-10-01 (×3): 1 mg via ORAL
  Filled 2015-09-29 (×5): qty 1

## 2015-09-29 MED ORDER — BENZTROPINE MESYLATE 1 MG PO TABS
1.0000 mg | ORAL_TABLET | Freq: Every day | ORAL | Status: DC
  Administered 2015-09-30 – 2015-10-02 (×3): 1 mg via ORAL
  Filled 2015-09-29: qty 14
  Filled 2015-09-29 (×4): qty 1

## 2015-09-29 MED ORDER — HALOPERIDOL 5 MG PO TABS
10.0000 mg | ORAL_TABLET | Freq: Every day | ORAL | Status: DC
  Administered 2015-09-29 – 2015-10-01 (×3): 10 mg via ORAL
  Filled 2015-09-29 (×5): qty 2

## 2015-09-29 NOTE — Progress Notes (Signed)
DAR NOTE: Patient remained isolative to his room.  Mood and affect is pleasant.  Denies pain, auditory and visual hallucinations.  Rates depression at 0, hopelessness at 0, and anxiety at 0.  Maintained on routine safety checks.  Medications given as prescribed.  Support and encouragement offered as needed.  Attended group and participated.   Offered no complaint.

## 2015-09-29 NOTE — Progress Notes (Signed)
   D: Pt informed the writer that he's "ready to be discharged". When asked about his plans after discharge pt stated, "to go on vacation". When asked where he plans to live after vacation, pt stated, "my family will join me while I'm on vacation".  Pt sometimes smiled inappropriately during the conversation. Pt has no questions or concerns.    A:  Support and encouragement was offered. 15 min checks continued for safety.  R: Pt remains safe.

## 2015-09-29 NOTE — [facility] Group Notes (Signed)
[name] Group Notes:  (Nursing/MHT/Case Management/Adjunct)  Date:  09/29/2015  Time:  11:12 AM  Type of Therapy:  Nurse Education  Participation Level:  Active  Participation Quality:  Appropriate and Attentive  Affect:  Appropriate  Cognitive:  Alert and Appropriate  Insight:  Appropriate, Good and Improving  Engagement in Group:  Engaged and Improving  Modes of Intervention:  Activity, Discussion, Education and Support  Summary of Progress/Problems: Topic was on healthy coping skills. Discussed the importance of surrounding oneself with a good support system. Support systems are there to motivate, encourage, and assist with learning new coping skills that leads to a healthy lifestyle. Patient was attentive and receptive.    Mickie Bail 09/29/2015, 11:12 AM

## 2015-09-29 NOTE — Progress Notes (Signed)
Writer spoke with patient 1:1 after he woke up. He sat up briefly in the dayroom and watched tv and ate snack but returned to his room after taking his medications. He voiced no complaints and denies si/hi/a/v hallucinations. Support given and safety maintained on unit with 15 min checks.
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
## 2015-09-29 NOTE — [facility] Group Notes (Signed)
[name] Group Notes:  (Clinical Social Work)  09/29/2015  11:15-12:00PM  Summary of Progress/Problems:   Today's process group involved patients discussing their feelings related to being hospitalized, as well as how they can use their present feelings to create a plan for out how to stay out of the hospital in the future.  A variety of coping skills were discussed in more depth, including use of a pillbox and following up with a psychiatrist and therapist. The patient expressed his primary feeling about being hospitalized is that he needs recovery.  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  He participated little, and hummed in a falsetto voice through much of group, stating he was writing a song.  Type of Therapy:  Group Therapy - Process  Participation Level:  Minimal  Participation Quality:  Intrusive, Sharing and Distracting  Affect:  Flat  Cognitive:  Disorganized and Delusional  Insight:  Poor  Engagement in Therapy:  Poor  Modes of Intervention:  Exploration, Discussion  Ambrose Mantle, LCSW 09/29/2015, 12:44 PM

## 2015-09-30 DIAGNOSIS — F209 Schizophrenia, unspecified: Secondary | ICD-10-CM | POA: Diagnosis not present

## 2015-09-30 NOTE — Progress Notes (Signed)
Adult Psychoeducational Group Note  Date:  09/29/2015 Time:  7:41 PM  Group Topic/Focus:  Wrap-Up Group:   The focus of this group is to help patients review their daily goal of treatment and discuss progress on daily workbooks.  Participation Level:  Did Not Attend  Additional Comments:  Pt was asleep at the time of group.  Addy Mcmannis C 09/30/2015, 7:41 PM 

## 2015-09-30 NOTE — Plan of Care (Signed)
Problem: Ineffective individual coping Goal: STG: Patient will remain free from self harm Outcome: Progressing Patient has remained free from self harm.     

## 2015-09-30 NOTE — [facility] Group Notes (Signed)
[name] Group Notes:  (Clinical Social Work)  09/30/2015  [name] Group Notes:  (Clinical Social Work)  09/30/2015  11:00AM-12:00PM  Summary of Progress/Problems:  The main focus of today's process group was to listen to a variety of genres of music and to identify that different types of music provoke different responses.  The patient then was able to identify personally what was soothing for them, as well as energizing.  The patient expressed understanding of concepts, as well as knowledge of how each type of music affected him and how this can be used at home as a wellness/recovery tool.  He enjoyed the group, felt good before group and felt good at the end, so it did not improve his mood, but he did enjoy it.  Type of Therapy:  Music Therapy   Participation Level:  Active  Participation Quality:  Attentive and Sharing  Affect:  Blunted  Cognitive:  Oriented  Insight:  Engaged  Engagement in Therapy:  Engaged  Modes of Intervention:   Activity, Exploration  Ambrose Mantle, LCSW 09/30/2015
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
## 2015-09-30 NOTE — Progress Notes (Signed)
Adult Psychoeducational Group Note  Date:  09/30/2015 Time:  8:53 PM  Group Topic/Focus:  Wrap-Up Group:   The focus of this group is to help patients review their daily goal of treatment and discuss progress on daily workbooks.  Participation Level:  Active  Participation Quality:  Attentive  Affect:  Appropriate  Cognitive:  Appropriate  Insight: Appropriate  Engagement in Group:  Engaged  Modes of Intervention:  Discussion  Additional Comments:  Pt stated his goal for today was to relax because that is what he does on Sundays. Pt stated his goal for tomorrow is to leave.  Caswell Corwin 09/30/2015, 8:53 PM

## 2015-09-30 NOTE — Progress Notes (Signed)
D) Pt has been attending the groups. When groups or meals are over Pt goes to his room and lies back on his bed and sleeps. Affect is pleasant, but does not start conversations with others. Is withdrawn and seclusive. States "I feel better" and is not verbalizing delusional thinking. Pt rates his depression, hopelessness and anxiety all at a 0. Denies SI and HI A) Pt given support and encouraged to stay out in the milieu with his peers. Provided with a brief 1:1. R) Pt denies SI and HI.

## 2015-10-01 DIAGNOSIS — F200 Paranoid schizophrenia: Secondary | ICD-10-CM | POA: Diagnosis not present
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
## 2015-10-01 NOTE — Progress Notes (Signed)
Patient has been up and active on the unit, attended group this evening and has voiced no complaints. He has been up in the dayroom more but not interacting with peers. He reports that he is hoping to discharge on tomorrow.  Patient currently denies having pain, -si/hi/a/v hall. Support and encouragement offered, safety maintained on unit, will continue to monitor.

## 2015-10-01 NOTE — Tx Team (Signed)
Interdisciplinary Treatment Plan Update (Adult)  Date:  10/01/2015 Time Reviewed:  8:50 AM  Progress in Treatment: Attending groups: Yes. Participating in groups: Yes. Taking medication as prescribed:  Yes. Tolerating medication:  Yes. Family/Significant othe contact made:  Pt declined to sign release Patient understands diagnosis:  No, limited insight. Discussing patient identified problems/goals with staff:  Yes, see initial care plan. Medical problems stabilized or resolved:  Yes Denies suicidal/homicidal ideation: Yes. Issues/concerns per patient self-inventory: No. Other:  New problem(s) identified:   Discharge Plan or Barriers: See below   Reason for Continuation of Hospitalization: Delusions  Hallucinations Medication stabilization  Comments:  Patient was admitted due to delusional thinking, believing that people were injecting him with feces. Patient endorses positive auditory hallucinations and hears voices that state "we are going to get you" Patient also reports having moments of rage where he can not control himself and stated that those occurred even in childhood. Patient currently denies SI and HI and is able to contract for safety. Patient received and agreed to unit treatment plan agreement and was pleasant and cooperative on assessment. Patient quickly acclimated to unit.       Cogentin, Haldol, Trileptal trial   Estimated length of stay: Likely d/c tomorrow  New goal(s):  Review of initial/current patient goals per problem list:  1. Goal(s): Patient will participate in aftercare plan  Met: Yes  Target date: at discharge  As evidenced by: Patient will participate within aftercare plan AEB aftercare provider and housing plan at discharge being identified. 09/26/15: Pt will follow-up outpt with [name] but housing is still unknown.  Declined to sign up for [facility] program when visited today. 10/01/15:  States he will stay with a friend, follow up  [name]   4. Goal(s): Patient will demonstrate decreased signs of psychosis.  Met: No  Target date:at discharge  As evidenced by: Patient will demonstrate decreased signs of psychosis as evidenced by a reduction in AVH, paranoia, and/or delusions.   09/26/15: Pt  Exhibits paranoia and  disorganized and delusional thinking. 10/01/15:  No signs nor symptoms of psychosis today     Attendees: Patient:  10/01/2015 8:50 AM  Family:   10/01/2015 8:50 AM  Physician:  Dr. Ursula Alert, MD 10/01/2015 8:50 AM  Nursing:   Manuella Ghazi, RN 10/01/2015 8:50 AM  Case Manager:  Roque Lias, LCSW 10/01/2015 8:50 AM  Counselor:  Matthew Saras, MSW Intern 10/01/2015 8:50 AM  Other:   10/01/2015 8:50 AM  Other:   10/01/2015 8:50 AM  Other:   10/01/2015 8:50 AM  Other:  10/01/2015 8:50 AM  Other:    Other:    Other:    Other:    Other:    Other:      Scribe for Treatment Team:   Georga Kaufmann, MSW Intern 10/01/2015 8:50 AM

## 2015-10-01 NOTE — [facility] Group Notes (Signed)
[name] LCSW Aftercare Discharge Planning Group Note   10/01/2015 9:49 AM  Participation Quality:  Minimal  Mood/Affect:  Appropriate  Depression Rating:  0  Anxiety Rating:  0  Thoughts of Suicide:  No Will you contract for safety?   NA  Current AVH:  denies  Plan for Discharge/Comments:  "I was hoping to go today, but I'm OK with waiting until tomorrow.  Can I get a bus pass when I go?"  Transportation Means:   Supports:  [place], [place] B

## 2015-10-01 NOTE — Progress Notes (Signed)
DAR Note: Collin White has been visible on the unit.  Attending groups.  Minimal interaction with peers.  He denies any SI/HI or A/V hallucinations.  He denies any pain or discomfort and appears to be in no physical distress.  He states that he feels like he is ready to leave and he will stay a few days with a friend.  He completed his self inventory and reports that his depression, hopelessness and anxiety are 0/10.   He did not state what his goal is today and was unable to verbalize as well.  Encouraged continued participation in group and unit activities.  Q 15 minute checks maintained for safety.  We will continue to monitor the progress towards his goals.

## 2015-10-01 NOTE — Progress Notes (Signed)
Patient ID: Collin White, male   DOB: 02/23/1996, 20 y.o.   MRN: [id number] D: Patient in room sleeping on approach. Pt mood and affect appeared depressed and sad. Pt denies SI/HI/AVH and pain. Cooperative with assessment, denies any needs or concern.  A: Met with pt 1:1. Medications administered as prescribed. Support and encouragement provided to attend groups and engage in milieu. Pt encouraged to discuss feelings and come to staff with any question..  R: Patient remains safe and complaint with medications.

## 2015-10-01 NOTE — [facility] Group Notes (Signed)
[name] LCSW Group Therapy  10/01/2015 1:15 pm  Type of Therapy: Process Group Therapy  Participation Level:  Active  Participation Quality:  Appropriate  Affect:  Flat  Cognitive:  Oriented  Insight:  Improving  Engagement in Group:  Limited  Engagement in Therapy:  Limited  Modes of Intervention:  Activity, Clarification, Education, Problem-solving and Support  Summary of Progress/Problems: Today's group addressed the issue of overcoming obstacles.  Patients were asked to identify their biggest obstacle post d/c that stands in the way of their on-going success, and then problem solve as to how to manage this.  Stayed the entire time, asleep for some of it.  When asked, stated his biggest obstacle is his decision making process.  Unable to give any examples.  Also talked about how he is learning patience, that was one his mother's admonitions to him, and sometimes "God holds my tongue," and later he realizes it was a good thing.  Vague.  Ida Rogue 10/01/2015   3:14 PM

## 2015-10-01 NOTE — Plan of Care (Signed)
Problem: Alteration in thought process Goal: LTG-Patient has not harmed self or others in at least 2 days Outcome: Progressing Patient has not harmed self or others in 2 days.     

## 2015-10-02 ENCOUNTER — Encounter (HOSPITAL_COMMUNITY): Payer: Self-pay | Admitting: Emergency Medicine

## 2015-10-02 DIAGNOSIS — F1290 Cannabis use, unspecified, uncomplicated: Secondary | ICD-10-CM | POA: Diagnosis not present

## 2015-10-02 DIAGNOSIS — Z8659 Personal history of other mental and behavioral disorders: Secondary | ICD-10-CM

## 2015-10-02 DIAGNOSIS — F29 Unspecified psychosis not due to a substance or known physiological condition: Secondary | ICD-10-CM | POA: Insufficient documentation

## 2015-10-02 DIAGNOSIS — F200 Paranoid schizophrenia: Secondary | ICD-10-CM | POA: Diagnosis not present

## 2015-10-02 DIAGNOSIS — F209 Schizophrenia, unspecified: Secondary | ICD-10-CM | POA: Diagnosis not present

## 2015-10-02 DIAGNOSIS — R4183 Borderline intellectual functioning: Secondary | ICD-10-CM | POA: Diagnosis not present

## 2015-10-02 MED ORDER — HYDROXYZINE HCL 25 MG PO TABS: 25.0000 mg | ORAL_TABLET | Freq: Three times a day (TID) | ORAL | Status: DC | PRN

## 2015-10-02 MED ORDER — HALOPERIDOL 5 MG PO TABS: ORAL_TABLET | ORAL | Status: DC

## 2015-10-02 MED ORDER — BENZTROPINE MESYLATE 1 MG PO TABS: 1.0000 mg | ORAL_TABLET | Freq: Two times a day (BID) | ORAL | Status: DC

## 2015-10-02 MED ORDER — OXCARBAZEPINE 300 MG PO TABS: 300.0000 mg | ORAL_TABLET | Freq: Two times a day (BID) | ORAL | Status: DC

## 2015-10-02 NOTE — Progress Notes (Signed)
Collin White has been pleasant and cooperative.  Attending groups.  Interacting with staff.  He took his medication without difficulty.  HE completed his self inventory and reports that his depression, hopelessness and anxiety are 0/10.  Pt. D/C from the unit to lobby with bus ticket.  Pleasant and cooperative.  He voiced no SI/HI or A/V halluciations.  Pt. Denies any pain or discomfort.  D/C instructions and medications reviewed with pt.  Pt. verbalized understanding of medications and d/c instructions.   All belongings (from locker 51- 3-hoodies, phone charger, belt, black shoes and camo coat ) returned to pt. Q 15 min checks maintained until discharge.  Pt. Left the unit in no apparent distress.

## 2015-10-02 NOTE — BHH Suicide Risk Assessment (Signed)
[facility] Discharge Suicide Risk Assessment   Principal Problem: Schizophrenia [name]) Discharge Diagnoses:  Patient Active Problem List   Diagnosis Date Noted  . 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 Schizophrenia (HCC) [F20.9] 09/29/2015  . Cannabis use disorder, moderate, in early remission [F12.90] 09/26/2015  . History of posttraumatic stress disorder (PTSD) [Z86.59] 09/26/2015    Total Time spent with patient: 30 minutes  Musculoskeletal: Strength & Muscle Tone: within normal limits Gait & Station: normal Patient leans: N/A  Psychiatric Specialty Exam: Review of Systems  Psychiatric/Behavioral: Negative for depression, suicidal ideas and hallucinations. The patient is not nervous/anxious.   All other systems reviewed and are negative.   Blood pressure 109/61, pulse 99, temperature 98.7 F (37.1 C), temperature source Oral, resp. rate 16, height 6' (1.829 m), weight 66.225 kg (146 lb), SpO2 100 %.Body mass index is 19.8 kg/(m^2).  General Appearance: Casual  Eye Contact::  Fair  Speech:  Clear and Coherent409  Volume:  Normal  Mood:  Euthymic  Affect:  Appropriate  Thought Process:  Coherent  Orientation:  Full (Time, Place, and Person)  Thought Content:  WDL  Suicidal Thoughts:  No  Homicidal Thoughts:  No  Memory:  Immediate;   Fair Recent;   Fair Remote;   Fair  Judgement:  Fair  Insight:  Fair  Psychomotor Activity:  Normal  Concentration:  Fair  Recall:  [employer] of Knowledge:Fair  Language: Fair  Akathisia:  No  Handed:  Right  AIMS (if indicated):     Assets:  Desire for Improvement  Sleep:  Number of Hours: 6.5  Cognition: WNL  ADL's:  Intact   Mental Status Per Nursing Assessment::   On Admission:     Demographic Factors:  Male  Loss Factors: NA  Historical Factors: Impulsivity  Risk Reduction Factors:   Positive coping skills or problem solving skills  Continued Clinical Symptoms:  Previous Psychiatric Diagnoses and  Treatments  Cognitive Features That Contribute To Risk:  None    Suicide Risk:  Minimal: No identifiable suicidal ideation.  Patients presenting with no risk factors but with morbid ruminations; may be classified as minimal risk based on the severity of the depressive symptoms  Follow-up Information    Go to [facility].   Specialty:  Behavioral Health   Why:  Walk in apt Mon-Fri 8a-3p. Please arrive as early as possible to make sure that you are able to be seen.   Contact information:   [address] [place] [address] [phone]       Plan Of Care/Follow-up recommendations:  Activity:  no restrictions Diet:  regular Tests:  as needed Other:  follow up with aftercare  Jamiel Goncalves, MD 10/02/2015, 9:38 AM

## 2015-10-02 NOTE — Plan of Care (Signed)
Problem: Alteration in thought process Goal: STG-Patient does not respond to command hallucinations Outcome: Progressing Pt denies AVH and command hallucination

## 2015-10-02 NOTE — BHH Group Notes (Signed)
[name] Group Notes:  (Nursing/MHT/Case Management/Adjunct)  Date:  10/02/2015  Time:  9:30am  Type of Therapy:  Nurse Education  Participation Level:  Minimal  Participation Quality:  Attentive  Affect:  Blunted  Cognitive:  Alert and Lacking  Insight:  Limited  Engagement in Group:  Developing/Improving  Modes of Intervention:  Discussion and Education  Summary of Progress/Problems:  Group topic was Recovery.  Discussed what recovery means to group members and the importance of goal setting.  Discussed sleep hygiene.  He was attentive and answered one question during group time.  Later in the group he was noted to be dozing off.    [name] 10/02/2015, 10:27 AM

## 2015-10-02 NOTE — Discharge Summary (Signed)
Physician Discharge Summary Note  Patient:  Collin White is an 20 y.o., male MRN:  [id number] DOB:  01-31-1996 Patient phone:  [phone] (home)  Patient address:   [place] [address],  Total Time spent with patient: 30 minutes  Date of Admission:  09/25/2015 Date of Discharge: 10/02/2015  Reason for Admission:  Delusions, paranoia  Principal Problem: Schizophrenia [facility]) Discharge Diagnoses: Patient Active Problem List   Diagnosis Date Noted  . 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 Psychoses [F29]   . Schizophrenia (HCC) [F20.9] 09/29/2015  . Cannabis use disorder, moderate, in early remission [F12.90] 09/26/2015  . History of posttraumatic stress disorder (PTSD) [Z86.59] 09/26/2015    Past Psychiatric History:  See above noted  Past Medical History:  Past Medical History  Diagnosis Date  . Seizures [facility])     Past Surgical History  Procedure Laterality Date  . No past surgeries  patient stated he had facial reconstruction surgery as a child    Family History:  Family History  Problem Relation Age of Onset  . Mental illness Neg Hx    Family Psychiatric  History:  Denied Social History:  History  Alcohol Use No     History  Drug Use No    Social History   Social History  . Marital Status: Single    Spouse Name: N/A  . Number of Children: N/A  . Years of Education: N/A   Social History Main Topics  . Smoking status: Never Smoker   . Smokeless tobacco: None  . Alcohol Use: No  . Drug Use: No  . Sexual Activity: Yes    Birth Control/ Protection: Condom   Other Topics Concern  . None   Social History Narrative    Hospital Course:  Collin White is a 20 y.o. AA male , unemployed , single , homeless , who has a hx of PTSD , who presented unaccompanied to [facility] ED reporting an injured finger.   Collin White was admitted for Schizophrenia [facility]) and crisis management.  He was treated with the following medications:   Haldol 5 mg po daily and 10 mg po qhs for psychosis, Cogentin 1 mg po bid for EPS, Trileptal 300 mg po bid for mood sx.   Collin White was discharged with current medication and was instructed on how to take medications as prescribed; (details listed below under Medication List).  Medical problems were identified and treated as needed.  Home medications were restarted as appropriate. His labs were reviewed.  Prolactin level was slightly elevated and discussed with patient that this needed to be monitored by outpatient provider.    Improvement was monitored by observation and Collin White daily report of symptom reduction.  Emotional and mental status was monitored by daily self-inventory reports completed by Collin White and clinical staff.         Collin White was evaluated by the treatment team for stability and plans for continued recovery upon discharge.  Collin White motivation was an integral factor for scheduling further treatment.  Employment, transportation, bed availability, health status, family support, and any pending legal issues were also considered during his hospital stay.  He was offered further treatment options upon discharge including but not limited to Residential, Intensive Outpatient, and Outpatient treatment.  Collin White will follow up with the services as listed below under Follow Up Information.     Upon completion of this admission the [facility] was both mentally and medically stable for discharge denying suicidal/homicidal ideation,  auditory/visual/tactile hallucinations, delusional thoughts and paranoia.     Physical Findings: AIMS: Facial and Oral Movements Muscles of Facial Expression: None, normal Lips and Perioral Area: None, normal Jaw: None, normal Tongue: None, normal,Extremity Movements Upper (arms, wrists, hands, fingers): None, normal Lower (legs, knees, ankles, toes): None, normal, Trunk Movements Neck, shoulders, hips: None, normal, Overall  Severity Severity of abnormal movements (highest score from questions above): None, normal Incapacitation due to abnormal movements: None, normal Patient's awareness of abnormal movements (rate only patient's report): No Awareness, Dental Status Current problems with teeth and/or dentures?: No Does patient usually wear dentures?: No  CIWA:    COWS:     Musculoskeletal: Strength & Muscle Tone: within normal limits Gait & Station: normal Patient leans: N/A  Psychiatric Specialty Exam:  SEE MD SRA Review of Systems  All other systems reviewed and are negative.   Blood pressure 109/61, pulse 99, temperature 98.7 F (37.1 C), temperature source Oral, resp. rate 16, height 6' (1.829 m), weight 66.225 kg (146 lb), SpO2 100 %.Body mass index is 19.8 kg/(m^2).  Have you used any form of tobacco in the last 30 days? (Cigarettes, Smokeless Tobacco, Cigars, and/or Pipes): No  Has this patient used any form of tobacco in the last 30 days? (Cigarettes, Smokeless Tobacco, Cigars, and/or Pipes) Yes, NA  Metabolic Disorder Labs:  Lab Results  Component Value Date   HGBA1C 5.6 09/27/2015   MPG 114 09/27/2015   Lab Results  Component Value Date   PROLACTIN 32.6* 09/27/2015   Lab Results  Component Value Date   CHOL 162 09/27/2015   TRIG 132 09/27/2015   HDL 60 09/27/2015   CHOLHDL 2.7 09/27/2015   VLDL 26 09/27/2015   LDLCALC 76 09/27/2015    See Psychiatric Specialty Exam and Suicide Risk Assessment completed by Attending Physician prior to discharge.  Discharge destination:  Home  Is patient on multiple antipsychotic therapies at discharge:  No   Has Patient had three or more failed trials of antipsychotic monotherapy by history:  No  Recommended Plan for Multiple Antipsychotic Therapies: NA     Medication List    TAKE these medications      Indication   benztropine 1 MG tablet  Commonly known as:  COGENTIN  Take 1 tablet (1 mg total) by mouth 2 (two) times daily.    Indication:  Extrapyramidal Reaction caused by Medications     haloperidol 5 MG tablet  Commonly known as:  HALDOL  Take on tablet (5 mg) in the morning.  Take 2 tabs (10 mg) at bedtime.   Indication:  Psychosis     hydrOXYzine 25 MG tablet  Commonly known as:  ATARAX/VISTARIL  Take 1 tablet (25 mg total) by mouth 3 (three) times daily as needed for anxiety.   Indication:  Anxiety Neurosis     Oxcarbazepine 300 MG tablet  Commonly known as:  TRILEPTAL  Take 1 tablet (300 mg total) by mouth 2 (two) times daily.   Indication:  mood stabilization           Follow-up Information    Go to [name].   Specialty:  Behavioral Health   Why:  Walk in apt Mon-Fri 8a-3p. Please arrive as early as possible to make sure that you are able to be seen.   [address] [place] [address] [phone]       Follow-up recommendations:  Activity:  as tol Diet:  as tol  Comments:  1.  Take all your medications as prescribed.              2.  Report any adverse side effects to outpatient provider.                       3.  Patient instructed to not use alcohol or illegal drugs while on prescription medicines.            4.  In the event of worsening symptoms, instructed patient to call 911, the crisis hotline or go to nearest emergency room for evaluation of symptoms.  Signed: Lindwood Qua, NP [facility] 10/02/2015, 9:57 AM

## 2015-10-02 NOTE — Progress Notes (Signed)
  [facility] Adult Case Management Discharge Plan :  Will you be returning to the same living situation after discharge:  Yes,  friend's house At discharge, do you have transportation home?: Yes,  bus pass Do you have the ability to pay for your medications: Yes,  mental health  Release of information consent forms completed and in the chart;  Patient's signature needed at discharge.  Patient to Follow up at: Follow-up Information    Go to [facility].   Specialty:  Behavioral Health   Why:  Walk in apt Mon-Fri 8a-3p. Please arrive as early as possible to make sure that you are able to be seen.   Contact information:   [address] [place] [address] [phone]       Next level of care provider has access to [name] Link:no  Safety Planning and Suicide Prevention discussed: Yes,  yes  Have you used any form of tobacco in the last 30 days? (Cigarettes, Smokeless Tobacco, Cigars, and/or Pipes): No  Has patient been referred to the Quitline?: N/A patient is not a smoker  Patient has been referred for addiction treatment: N/A  Ida Rogue 10/02/2015, 9:16 AM

## 2015-12-10 ENCOUNTER — Emergency Department (HOSPITAL_COMMUNITY)
Admission: EM | Admit: 2015-12-10 | Discharge: 2015-12-11 | Disposition: A | Discharge status: Home / Self Care | Payer: Self-pay | Attending: Emergency Medicine | Admitting: Emergency Medicine

## 2015-12-10 ENCOUNTER — Encounter (HOSPITAL_COMMUNITY): Payer: Self-pay | Admitting: Emergency Medicine

## 2015-12-10 DIAGNOSIS — F17290 Nicotine dependence, other tobacco product, uncomplicated: Secondary | ICD-10-CM | POA: Insufficient documentation

## 2015-12-10 DIAGNOSIS — G40909 Epilepsy, unspecified, not intractable, without status epilepticus: Secondary | ICD-10-CM | POA: Insufficient documentation

## 2015-12-10 DIAGNOSIS — Z79899 Other long term (current) drug therapy: Secondary | ICD-10-CM | POA: Insufficient documentation

## 2015-12-10 DIAGNOSIS — K649 Unspecified hemorrhoids: Secondary | ICD-10-CM

## 2015-12-10 DIAGNOSIS — K644 Residual hemorrhoidal skin tags: Secondary | ICD-10-CM | POA: Insufficient documentation

## 2015-12-10 LAB — BASIC METABOLIC PANEL
Anion gap: 8 (ref 5–15)
BUN: 22 mg/dL — AB (ref 6–20)
CALCIUM: 9.8 mg/dL (ref 8.9–10.3)
CHLORIDE: 108 mmol/L (ref 101–111)
CO2: 29 mmol/L (ref 22–32)
CREATININE: 1.08 mg/dL (ref 0.61–1.24)
GFR calc non Af Amer: >60 mL/min (ref >60)
Glucose, Bld: 126 mg/dL — ABNORMAL HIGH (ref 65–99)
Potassium: 4 mmol/L (ref 3.5–5.1)
SODIUM: 145 mmol/L (ref 135–145)

## 2015-12-10 LAB — POC OCCULT BLOOD, ED: FECAL OCCULT BLD: POSITIVE — AB

## 2015-12-10 LAB — CBC WITH DIFFERENTIAL/PLATELET
BASOS PCT: 0 %
Basophils Absolute: 0 10*3/uL (ref 0.0–0.1)
EOS ABS: 0.1 10*3/uL (ref 0.0–0.7)
EOS PCT: 1 %
HCT: 40.4 % (ref 39.0–52.0)
HEMOGLOBIN: 13.6 g/dL (ref 13.0–17.0)
LYMPHS ABS: 2.1 10*3/uL (ref 0.7–4.0)
Lymphocytes Relative: 32 %
MCH: 28.5 pg (ref 26.0–34.0)
MCHC: 33.7 g/dL (ref 30.0–36.0)
MCV: 84.7 fL (ref 78.0–100.0)
MONO ABS: 0.6 10*3/uL (ref 0.1–1.0)
MONOS PCT: 8 %
NEUTROS PCT: 59 %
Neutro Abs: 3.9 10*3/uL (ref 1.7–7.7)
PLATELETS: 182 10*3/uL (ref 150–400)
RBC: 4.77 MIL/uL (ref 4.22–5.81)
RDW: 13 % (ref 11.5–15.5)
WBC: 6.7 10*3/uL (ref 4.0–10.5)

## 2015-12-10 LAB — URINALYSIS, ROUTINE W REFLEX MICROSCOPIC
BILIRUBIN URINE: NEGATIVE
Glucose, UA: NEGATIVE mg/dL
HGB URINE DIPSTICK: NEGATIVE
KETONES UR: NEGATIVE mg/dL
Leukocytes, UA: NEGATIVE
NITRITE: NEGATIVE
Protein, ur: NEGATIVE mg/dL
SPECIFIC GRAVITY, URINE: 1.03 (ref 1.005–1.030)
pH: 7.5 (ref 5.0–8.0)
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
## 2015-12-11 MED ORDER — DOCUSATE SODIUM 100 MG PO CAPS
100.0000 mg | ORAL_CAPSULE | Freq: Two times a day (BID) | ORAL | Status: DC
Start: 2015-12-11 — End: 2015-12-13

## 2015-12-11 NOTE — ED Notes (Signed)
Pt refused to leave until the minor abrasions on his feet were wrapped. RN instructed pt that he must wear socks with his shoes in order to prevent them from blistering especially considering moisture from rain introduced. RN placed a dry dressing to pts feet bilaterally and provided pt w/ socks. Pt verbally indicated he understands.
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
## 2015-12-11 NOTE — Discharge Instructions (Signed)
Hemorrhoids °Hemorrhoids are swollen veins around the rectum or anus. There are two types of hemorrhoids:  °1. Internal hemorrhoids. These occur in the veins just inside the rectum. They may poke through to the outside and become irritated and painful. °2. External hemorrhoids. These occur in the veins outside the anus and can be felt as a painful swelling or hard lump near the anus. °CAUSES °· Pregnancy.   °· Obesity.   °· Constipation or diarrhea.   °· Straining to have a bowel movement.   °· Sitting for long periods on the toilet. °· Heavy lifting or other activity that caused you to strain. °· Anal intercourse. °SYMPTOMS  °· Pain.   °· Anal itching or irritation.   °· Rectal bleeding.   °· Fecal leakage.   °· Anal swelling.   °· One or more lumps around the anus.   °DIAGNOSIS  °Your caregiver may be able to diagnose hemorrhoids by visual examination. Other examinations or tests that may be performed include:  °· Examination of the rectal area with a gloved hand (digital rectal exam).   °· Examination of anal canal using a small tube (scope).   °· A blood test if you have lost a significant amount of blood. °· A test to look inside the colon (sigmoidoscopy or colonoscopy). °TREATMENT °Most hemorrhoids can be treated at home. However, if symptoms do not seem to be getting better or if you have a lot of rectal bleeding, your caregiver may perform a procedure to help make the hemorrhoids get smaller or remove them completely. Possible treatments include:  °· Placing a rubber band at the base of the hemorrhoid to cut off the circulation (rubber band ligation).   °· Injecting a chemical to shrink the hemorrhoid (sclerotherapy).   °· Using a tool to burn the hemorrhoid (infrared light therapy).   °· Surgically removing the hemorrhoid (hemorrhoidectomy).   °· Stapling the hemorrhoid to block blood flow to the tissue (hemorrhoid stapling).   °HOME CARE INSTRUCTIONS  °· Eat foods with fiber, such as whole grains, beans,  nuts, fruits, and vegetables. Ask your doctor about taking products with added fiber in them (fiber supplements). °· Increase fluid intake. Drink enough water and fluids to keep your urine clear or pale yellow.   °· Exercise regularly.   °· Go to the bathroom when you have the urge to have a bowel movement. Do not wait.   °· Avoid straining to have bowel movements.   °· Keep the anal area dry and clean. Use wet toilet paper or moist towelettes after a bowel movement.   °· Medicated creams and suppositories may be used or applied as directed.   °· Only take over-the-counter or prescription medicines as directed by your caregiver.   °· Take warm sitz baths for 15-20 minutes, 3-4 times a day to ease pain and discomfort.   °· Place ice packs on the hemorrhoids if they are tender and swollen. Using ice packs between sitz baths may be helpful.   °¨ Put ice in a plastic bag.   °¨ Place a towel between your skin and the bag.   °¨ Leave the ice on for 15-20 minutes, 3-4 times a day.   °· Do not use a donut-shaped pillow or sit on the toilet for long periods. This increases blood pooling and pain.   °SEEK MEDICAL CARE IF: °· You have increasing pain and swelling that is not controlled by treatment or medicine. °· You have uncontrolled bleeding. °· You have difficulty or you are unable to have a bowel movement. °· You have pain or inflammation outside the area of the hemorrhoids. °MAKE SURE YOU: °· Understand these instructions. °·   Will watch your condition.  Will get help right away if you are not doing well or get worse.   This information is not intended to replace advice given to you by your health care provider. Make sure you discuss any questions you have with your health care provider.   Document Released: 08/01/2000 Document Revised: 07/21/2012 Document Reviewed: 06/08/2012 Elsevier Interactive Patient Education 2016 Elsevier Inc.  High-Fiber Diet Fiber, also called dietary fiber, is a type of carbohydrate  found in fruits, vegetables, whole grains, and beans. A high-fiber diet can have many health benefits. Your health care provider may recommend a high-fiber diet to help: 3. Prevent constipation. Fiber can make your bowel movements more regular. 4. Lower your cholesterol. 5. Relieve hemorrhoids, uncomplicated diverticulosis, or irritable bowel syndrome. 6. Prevent overeating as part of a weight-loss plan. 7. Prevent heart disease, type 2 diabetes, and certain cancers. WHAT IS MY PLAN? The recommended daily intake of fiber includes:  38 grams for men under age 22.  30 grams for men over age 22.  25 grams for women under age 22.  21 grams for women over age 22. You can get the recommended daily intake of dietary fiber by eating a variety of fruits, vegetables, grains, and beans. Your health care provider may also recommend a fiber supplement if it is not possible to get enough fiber through your diet. WHAT DO I NEED TO KNOW ABOUT A HIGH-FIBER DIET?  Fiber supplements have not been widely studied for their effectiveness, so it is better to get fiber through food sources.  Always check the fiber content on thenutrition facts label of any prepackaged food. Look for foods that contain at least 5 grams of fiber per serving.  Ask your dietitian if you have questions about specific foods that are related to your condition, especially if those foods are not listed in the following section.  Increase your daily fiber consumption gradually. Increasing your intake of dietary fiber too quickly may cause bloating, cramping, or gas.  Drink plenty of water. Water helps you to digest fiber. WHAT FOODS CAN I EAT? Grains Whole-grain breads. Multigrain cereal. Oats and oatmeal. Brown rice. Barley. Bulgur wheat. Millet. Bran muffins. Popcorn. Rye wafer crackers. Vegetables Sweet potatoes. Spinach. Kale. Artichokes. Cabbage. Broccoli. Green peas. Carrots. Squash. Fruits Berries. Pears. Apples. Oranges.  Avocados. Prunes and raisins. Dried figs. Meats and Other Protein Sources Navy, kidney, pinto, and soy beans. Split peas. Lentils. Nuts and seeds. Dairy Fiber-fortified yogurt. Beverages Fiber-fortified soy milk. Fiber-fortified orange juice. Other Fiber bars. The items listed above may not be a complete list of recommended foods or beverages. Contact your dietitian for more options. WHAT FOODS ARE NOT RECOMMENDED? Grains White bread. Pasta made with refined flour. White rice. Vegetables Fried potatoes. Canned vegetables. Well-cooked vegetables.  Fruits Fruit juice. Cooked, strained fruit. Meats and Other Protein Sources Fatty cuts of meat. Fried Environmental education officerpoultry or fried fish. Dairy Milk. Yogurt. Cream cheese. Sour cream. Beverages Soft drinks. Other Cakes and pastries. Butter and oils. The items listed above may not be a complete list of foods and beverages to avoid. Contact your dietitian for more information. WHAT ARE SOME TIPS FOR INCLUDING HIGH-FIBER FOODS IN MY DIET?  Eat a wide variety of high-fiber foods.  Make sure that half of all grains consumed each day are whole grains.  Replace breads and cereals made from refined flour or white flour with whole-grain breads and cereals.  Replace white rice with brown rice, bulgur wheat, or millet.  Start  the day with a breakfast that is high in fiber, such as a cereal that contains at least 5 grams of fiber per serving.  Use beans in place of meat in soups, salads, or pasta.  Eat high-fiber snacks, such as berries, raw vegetables, nuts, or popcorn.   This information is not intended to replace advice given to you by your health care provider. Make sure you discuss any questions you have with your health care provider.   Document Released: 08/04/2005 Document Revised: 08/25/2014 Document Reviewed: 01/17/2014 Elsevier Interactive Patient Education 2016 Elsevier Inc.  Disposable Sitz Bath A disposable sitz bath is a plastic basin  that fits over the toilet. A bag is hung above the toilet and is connected to a tube that opens into the disposable sitz bath. The bag is filled with warm water that can flow into the basin through the tube.  HOW TO USE A DISPOSABLE SITZ BATH 8. Close the clamp on the tubing before filling the bag with water. This is to prevent leakage. 9. Fill the sitz bath basin and the plastic bag with warm water. 10. Place the filled basin on the toilet with the seat raised. Make sure the overflow opening is facing toward the back of the toilet. 11. Hang the filled plastic bag overhead on a hook or towel rack close to the toilet. When the bag is unclamped, a steady stream of water will flow from the bag, through the tubing, and into the basin. 12. Attach the tubing to the opening on the basin. 13. Sit on the basin positioned on the toilet seat and release the clamp. This will allow warm water to flush the area around your genitals and anus (perineum). 14. Remain sitting on the basin for approximately 15 to 20 minutes. 15. Stand up and pat the perineum area dry. If needed, apply clean bandages (dressings) to the affected area. 16. Tip the basin into the toilet to remove any remaining water and flush the toilet. 17. Wash the basin with warm water and soap. Let it dry in the sink. 18. Store the basin and tubing in a clean, dry area. 19. Wash your hands with soap and water. SEEK MEDICAL CARE IF: You get worse instead of better. Stop the sitz baths if you get worse. MAKE SURE YOU:  Understand these instructions.  Will watch your condition.  Will get help right away if you are not doing well or get worse.   This information is not intended to replace advice given to you by your health care provider. Make sure you discuss any questions you have with your health care provider.   Document Released: 02/03/2012 Document Revised: 04/28/2012 Document Reviewed: 02/03/2012 Elsevier Interactive Patient Education [id number]  Elsevier Inc.

## 2015-12-13 ENCOUNTER — Emergency Department (HOSPITAL_COMMUNITY)
Admission: EM | Admit: 2015-12-13 | Discharge: 2015-12-13 | Disposition: A | Discharge status: Home / Self Care | Payer: Self-pay | Attending: Emergency Medicine | Admitting: Emergency Medicine

## 2015-12-13 ENCOUNTER — Encounter (HOSPITAL_COMMUNITY): Payer: Self-pay | Admitting: Emergency Medicine

## 2015-12-13 ENCOUNTER — Encounter (HOSPITAL_COMMUNITY): Payer: Self-pay

## 2015-12-13 ENCOUNTER — Emergency Department (HOSPITAL_COMMUNITY)
Admission: EM | Admit: 2015-12-13 | Discharge: 2015-12-14 | Disposition: A | Discharge status: Home / Self Care | Payer: Self-pay | Attending: Emergency Medicine | Admitting: Emergency Medicine

## 2015-12-13 DIAGNOSIS — F17210 Nicotine dependence, cigarettes, uncomplicated: Secondary | ICD-10-CM | POA: Insufficient documentation

## 2015-12-13 DIAGNOSIS — M5410 Radiculopathy, site unspecified: Secondary | ICD-10-CM | POA: Insufficient documentation

## 2015-12-13 DIAGNOSIS — R1012 Left upper quadrant pain: Secondary | ICD-10-CM | POA: Insufficient documentation

## 2015-12-13 DIAGNOSIS — K6289 Other specified diseases of anus and rectum: Secondary | ICD-10-CM | POA: Insufficient documentation

## 2015-12-13 DIAGNOSIS — R1032 Left lower quadrant pain: Secondary | ICD-10-CM | POA: Insufficient documentation

## 2015-12-13 DIAGNOSIS — Z79899 Other long term (current) drug therapy: Secondary | ICD-10-CM | POA: Insufficient documentation

## 2015-12-13 DIAGNOSIS — F17290 Nicotine dependence, other tobacco product, uncomplicated: Secondary | ICD-10-CM | POA: Insufficient documentation

## 2015-12-13 DIAGNOSIS — Z791 Long term (current) use of non-steroidal anti-inflammatories (NSAID): Secondary | ICD-10-CM | POA: Insufficient documentation

## 2015-12-13 DIAGNOSIS — G40909 Epilepsy, unspecified, not intractable, without status epilepticus: Secondary | ICD-10-CM | POA: Insufficient documentation

## 2015-12-13 MED ORDER — LIDOCAINE 5 % EX OINT: 1.0000 "application " | TOPICAL_OINTMENT | CUTANEOUS | Status: DC | PRN

## 2015-12-13 MED ORDER — DOCUSATE SODIUM 100 MG PO CAPS: 100.0000 mg | ORAL_CAPSULE | Freq: Two times a day (BID) | ORAL | Status: DC

## 2015-12-13 NOTE — ED Provider Notes (Signed)
CSN: [id number]     Arrival date & time 12/13/15  2051 History  By signing my name below, I, [facility], attest that this documentation has been prepared under the direction and in the presence of Derwood Kaplan, MD. Electronically Signed: Randell White, ED Scribe. 12/14/2015. 12:51 AM.   Chief Complaint  White presents with  . Generalized Body Aches   The history is provided by the White. No language interpreter was used.   HPI Comments: Collin White is a 20 y.o. male who presents to the Emergency Department complaining of constant, mild left leg numbness onset 5 hours ago. He reports left hip pain, left buttock, and lower back pain since this evening, and LUQ abdominal pain since arrival. Pain worse with ambulation. Denies hx of chronic conditions. Per White, he was seen last night for rectal pain and 3 days ago where he had "fluid replacement". Denies illicit drug use. Denies testicular pain, bladder/bowel incontinence. Pt has associated numbness and shooting pain in his LLE, but he denies any focal weakness, urinary incontinence, urinary retention, bowel incontinence, saddle anesthesia.    Past Medical History  Diagnosis Date  . Seizures [facility])    Past Surgical History  Procedure Laterality Date  . 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 Facial reconstructive surgery    . Abd surgery s/p traumatic event     No family history on file. Social History  Substance Use Topics  . Smoking status: Current Every Day Smoker    Types: Cigars  . Smokeless tobacco: None  . Alcohol Use: No    Review of Systems A complete 10 system review of systems was obtained and all systems are negative except as noted in the HPI and PMH.   Allergies  Review of White's allergies indicates no known allergies.  Home Medications   Prior to Admission medications   Medication Sig Start Date End Date Taking? Authorizing Provider  docusate sodium (COLACE) 100 MG capsule Take 1 capsule (100  mg total) by mouth every 12 (twelve) hours. 12/13/15   Derwood Kaplan, MD  lidocaine (XYLOCAINE) 5 % ointment Apply 1 application topically as needed. 12/13/15   Tadeo Besecker, MD   BP 107/81 mmHg  Pulse 71  Temp(Src) 97.5 F (36.4 C) (Oral)  Resp 16  SpO2 100% Physical Exam  Constitutional: He is oriented to person, place, and time. He appears well-developed and well-nourished.  HENT:  Head: Normocephalic and atraumatic.  Eyes: EOM are normal.  Neck: Normal range of motion.  Cardiovascular: Normal rate, regular rhythm, normal heart sounds and intact distal pulses.   Pulmonary/Chest: Effort normal and breath sounds normal. No respiratory distress.  Abdominal: Soft. He exhibits no distension. There is tenderness.  LLQ tenderness.  Genitourinary:  Rectal exam performed yesterday.  Musculoskeletal: Normal range of motion.  No step-offs. No TTP of the spine except for the sacral region. Pt also has pain extending down to coccyx. Pt able to ambulate but complains of pain to left lower abdomen. Positive straight leg raise on the right side. Positive straight leg raise on the left side.  Neurological: He is alert and oriented to person, place, and time.  2+ patellar reflexes bilaterally. Pt able to discriminate between sharp and dull. However he has subjective numbness on the LLE.  Skin: Skin is warm and dry.  Psychiatric: He has a normal mood and affect. Judgment normal.  Nursing note and vitals reviewed.   ED Course  Procedures   DIAGNOSTIC STUDIES: Oxygen Saturation is  100% on RA, normal by my interpretation.    COORDINATION OF CARE: 11:20 PM Will order labs and lower back x-ray. Discussed treatment plan with pt at bedside and pt agreed to plan.   Labs Review Labs Reviewed  CBC WITH DIFFERENTIAL/PLATELET  COMPREHENSIVE METABOLIC PANEL  URINALYSIS, ROUTINE W REFLEX MICROSCOPIC (NOT AT [facility])    Imaging Review Dg Lumbar Spine Complete  12/14/2015  CLINICAL DATA:  Sciatica  like symptoms with focal lower spine tenderness. No injury. EXAM: LUMBAR SPINE - COMPLETE 4+ VIEW COMPARISON:  None. FINDINGS: There is no evidence of lumbar spine fracture. Alignment is normal. Intervertebral disc spaces are maintained. IMPRESSION: Negative. Electronically Signed   By: Marnee SpringJonathon  Watts M.D.   On: 12/14/2015 00:29   I have personally reviewed and evaluated these images and lab results as part of my medical decision-making.   MDM   Final diagnoses:  Radicular pain    I personally performed the services described in this documentation, which was scribed in my presence. The recorded information has been reviewed and is accurate.   Pt comes in w/ back pain, generalized aches and weakness. VSS and WNL. He was seen yday, and few nights ago for rectal pain. No hemorrhoids seen by me yday, and pt did have an intact rectal tone. Today he is coming in with a new complaint - LLE numbness and pain - shooting down from the L hip/gluteal region. Pt was not the greatest of historian yday, or today, but he has no red flags for spinal cord compression or conus. Pt ambulated, reflexes checked and sensory exam done  -with the latter revealing some subjective numbness on the L side. No tick bites, no recent diarrhea, no ascending weakness/paralysis, no fevers, no uti like symptoms. Pt also on exam had some LLQ tenderness, but the tenderness was not peritoneal, no imaging indicated.  I also think there are some underlying social issues. Pt informed me that he lives with a friend, but also reports that he is hungry and hasnt eaten since 2 pm to RN. We will get basic labs, Xrays for screening purposes. I can't imagine young male of his mody habitus with sciatica this early, so xrays to ensure there is no structural issues.    Derwood KaplanAnkit Juniper Snyders, MD 12/14/15 [phone]

## 2015-12-13 NOTE — ED Provider Notes (Signed)
CSN: [id number]     Arrival date & time 12/13/15  0015 History  By signing my name below, I, Bethel Born, attest that this documentation has been prepared under the direction and in the presence of Derwood Kaplan, MD. Electronically Signed: Bethel Born, ED Scribe. 12/13/2015. 2:03 AM   Chief Complaint  Patient presents with  . Rectal Pain   The history is provided by the patient. No language interpreter was used.   Collin White is a 20 y.o. male who presents to the Emergency Department complaining of constant, 10/10 in severity, rectal pain and discharge with onset approximately 5 hours ago while walking. Pt states that he can feel and smell fluid leaking from his rectum.  He denies any rectal trauma or history of hemorrhoids. He has had no rectal penetration. Pt denies bloody stools and diarrhea.   Past Medical History  Diagnosis Date  . Seizures [facility])    Past Surgical History  Procedure Laterality Date  . 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 Facial reconstructive surgery    . Abd surgery s/p traumatic event     History reviewed. No pertinent family history. Social History  Substance Use Topics  . Smoking status: Current Every Day Smoker    Types: Cigars  . Smokeless tobacco: None  . Alcohol Use: No    Review of Systems  10 Systems reviewed and all are negative for acute change except as noted in the HPI.  Allergies  Review of patient's allergies indicates no known allergies.  Home Medications   Prior to Admission medications   Medication Sig Start Date End Date Taking? Authorizing Provider  docusate sodium (COLACE) 100 MG capsule Take 1 capsule (100 mg total) by mouth every 12 (twelve) hours. 12/13/15   Derwood Kaplan, MD  lidocaine (XYLOCAINE) 5 % ointment Apply 1 application topically as needed. 12/13/15   Derwood Kaplan, MD  naproxen (NAPROSYN) 500 MG tablet Take 1 tablet (500 mg total) by mouth 2 (two) times daily. 12/14/15   Goldman Birchall, MD   BP 104/54 mmHg   Pulse 63  Temp(Src) 98.4 F (36.9 C) (Oral)  Resp 19  SpO2 100% Physical Exam  Constitutional: He is oriented to person, place, and time. He appears well-developed and well-nourished.  HENT:  Head: Normocephalic.  Eyes: EOM are normal.  Neck: Normal range of motion.  Pulmonary/Chest: Effort normal.  Abdominal: He exhibits no distension.  Genitourinary:  External exam: no anal fissure. No hemmorhoids. no signs of trauma  Normal prostate size. No signs of abscess.   Musculoskeletal: Normal range of motion.  Neurological: He is alert and oriented to person, place, and time.  Psychiatric: He has a normal mood and affect.  Nursing note and vitals reviewed.   ED Course  Procedures (including critical care time) DIAGNOSTIC STUDIES: Oxygen Saturation is 100% on RA,  normal by my interpretation.    COORDINATION OF CARE: 1:45 AM Discussed treatment plan with pt at bedside and pt agreed to plan.  Labs Review Labs Reviewed - No data to display  Imaging Review Dg Lumbar Spine Complete  12/14/2015  CLINICAL DATA:  Sciatica like symptoms with focal lower spine tenderness. No injury. EXAM: LUMBAR SPINE - COMPLETE 4+ VIEW COMPARISON:  None. FINDINGS: There is no evidence of lumbar spine fracture. Alignment is normal. Intervertebral disc spaces are maintained. IMPRESSION: Negative. Electronically Signed   By: Marnee Spring M.D.   On: 12/14/2015 00:29      EKG Interpretation None  MDM   Final diagnoses:  Anal or rectal pain    Rectal pain - recently diagnosed as hemorrhoid. No clear evidence of hemorrhoids - and prostate exam + rectal exam reveals no concerns for prostatitis or abscess. Will give topical lido.   Derwood KaplanAnkit Christyl Osentoski, MD 12/14/15 1930

## 2015-12-13 NOTE — Discharge Instructions (Signed)
Your rectal exam is normal. Apply the ointment to see if you can get relief. Stool softeners prescribed.

## 2015-12-13 NOTE — ED Notes (Signed)
Patient c/o generalized body aches and pains.  Patient states that was in the hospital on Monday due to loss of blood from the rectum.  Patient states that was admitted on Monday and had fluid replacement.  Patient states that since he has been home has had body aches and pains "all over"  And he does not "feel right".  Denies bleeding from rectum since his D/C.  Patient states he feels "week and shaky".  Breathing even and unlabored, ambulatory to triage room.  NAD at this time.

## 2015-12-13 NOTE — ED Notes (Signed)
Per EMS pt is c/o rectal pain and that he has some "fluid" leaking from his rectal area  Denies any rectal bleeding  Pt states he is constipated and has not had a BM since Monday  Pt was seen here on Monday for same  Pt also states he has not eaten today since 2pm and is hungry

## 2015-12-14 ENCOUNTER — Emergency Department (HOSPITAL_COMMUNITY): Payer: Self-pay

## 2015-12-14 LAB — COMPREHENSIVE METABOLIC PANEL
ALBUMIN: 4.2 g/dL (ref 3.5–5.0)
ALT: 17 U/L (ref 17–63)
ANION GAP: 8 (ref 5–15)
AST: 26 U/L (ref 15–41)
Alkaline Phosphatase: 83 U/L (ref 38–126)
BILIRUBIN TOTAL: 0.7 mg/dL (ref 0.3–1.2)
BUN: 27 mg/dL — AB (ref 6–20)
CHLORIDE: 108 mmol/L (ref 101–111)
CO2: 27 mmol/L (ref 22–32)
Calcium: 9.4 mg/dL (ref 8.9–10.3)
Creatinine, Ser: 1.02 mg/dL (ref 0.61–1.24)
GFR calc Af Amer: >60 mL/min (ref >60)
GFR calc non Af Amer: >60 mL/min (ref >60)
GLUCOSE: 82 mg/dL (ref 65–99)
POTASSIUM: 3.9 mmol/L (ref 3.5–5.1)
SODIUM: 143 mmol/L (ref 135–145)
Total Protein: 7.6 g/dL (ref 6.5–8.1)

## 2015-12-14 LAB — CBC WITH DIFFERENTIAL/PLATELET
BASOS ABS: 0 10*3/uL (ref 0.0–0.1)
BASOS PCT: 0 %
EOS ABS: 0.1 10*3/uL (ref 0.0–0.7)
Eosinophils Relative: 2 %
HEMATOCRIT: 39.8 % (ref 39.0–52.0)
Hemoglobin: 13.6 g/dL (ref 13.0–17.0)
Lymphocytes Relative: 37 %
Lymphs Abs: 2.4 10*3/uL (ref 0.7–4.0)
MCH: 28.3 pg (ref 26.0–34.0)
MCHC: 34.2 g/dL (ref 30.0–36.0)
MCV: 82.9 fL (ref 78.0–100.0)
MONO ABS: 0.5 10*3/uL (ref 0.1–1.0)
Monocytes Relative: 8 %
NEUTROS ABS: 3.5 10*3/uL (ref 1.7–7.7)
Neutrophils Relative %: 53 %
PLATELETS: 193 10*3/uL (ref 150–400)
RBC: 4.8 MIL/uL (ref 4.22–5.81)
RDW: 12.9 % (ref 11.5–15.5)
WBC: 6.6 10*3/uL (ref 4.0–10.5)

## 2015-12-14 LAB — URINALYSIS, ROUTINE W REFLEX MICROSCOPIC
GLUCOSE, UA: NEGATIVE mg/dL
HGB URINE DIPSTICK: NEGATIVE
Ketones, ur: NEGATIVE mg/dL
LEUKOCYTES UA: NEGATIVE
Nitrite: NEGATIVE
PH: 5.5 (ref 5.0–8.0)
PROTEIN: NEGATIVE mg/dL
SPECIFIC GRAVITY, URINE: 1.036 — AB (ref 1.005–1.030)

## 2015-12-14 MED ORDER — NAPROXEN 500 MG PO TABS: 500.0000 mg | ORAL_TABLET | Freq: Two times a day (BID) | ORAL | Status: DC

## 2015-12-14 MED ORDER — NAPROXEN 500 MG PO TABS
500.0000 mg | ORAL_TABLET | Freq: Once | ORAL | Status: AC
  Administered 2015-12-14: 500 mg via ORAL
  Filled 2015-12-14: qty 1

## 2015-12-14 NOTE — Discharge Instructions (Signed)
All the results in the ER are normal, labs and imaging. We are not sure what is causing your symptoms. Appears to be radicular pain. The workup in the ER is not complete, and is limited to screening for life threatening and emergent conditions only, so please see a primary care doctor for further evaluation.   Lumbosacral Radiculopathy Lumbosacral radiculopathy is a condition that involves the spinal nerves and nerve roots in the low back and bottom of the spine. The condition develops when these nerves and nerve roots move out of place or become inflamed and cause symptoms. CAUSES This condition may be caused by:  Pressure from a disk that bulges out of place (herniated disk). A disk is a plate of cartilage that separates bones in the spine.  Disk degeneration.  A narrowing of the bones of the lower back (spinal stenosis).  A tumor.  An infection.  An injury that places sudden pressure on the disks that cushion the bones of your lower spine. RISK FACTORS This condition is more likely to develop in:  Males aged 30-50 years.  Females aged 50-60 years.  People who lift improperly.  People who are overweight or live a sedentary lifestyle.  People who smoke.  People who perform repetitive activities that strain the spine. SYMPTOMS Symptoms of this condition include:  Pain that goes down from the back into the legs (sciatica). This is the most common symptom. The pain may be worse with sitting, coughing, or sneezing.  Pain and numbness in the arms and legs.  Muscle weakness.  Tingling.  Loss of bladder control or bowel control. DIAGNOSIS This condition is diagnosed with a physical exam and medical history. If the pain is lasting, you may have tests, such as:  MRI scan.  X-ray.  CT scan.  Myelogram.  Nerve conduction study. TREATMENT This condition is often treated with:  Hot packs and ice applied to affected areas.  Stretches to improve  flexibility.  Exercises to strengthen back muscles.  Physical therapy.  Pain medicine.  A steroid injection in the spine. In some cases, no treatment is needed. If the condition is long-lasting (chronic), or if symptoms are severe, treatment may involve surgery or lifestyle changes, such as following a weight loss plan. HOME CARE INSTRUCTIONS Medicines  Take medicines only as directed by your health care provider.  Do not drive or operate heavy machinery while taking pain medicine. Injury Care  Apply a heat pack to the injured area as directed by your health care provider.  Apply ice to the affected area:  Put ice in a plastic bag.  Place a towel between your skin and the bag.  Leave the ice on for 20-30 minutes, every 2 hours while you are awake or as needed. Or, leave the ice on for as long as directed by your health care provider. Other Instructions  If you were shown how to do any exercises or stretches, do them as directed by your health care provider.  If your health care provider prescribed a diet or exercise program, follow it as directed.  Keep all follow-up visits as directed by your health care provider. This is important. SEEK MEDICAL CARE IF:  Your pain does not improve over time even when taking pain medicines. SEEK IMMEDIATE MEDICAL CARE IF:  Your develop severe pain.  Your pain suddenly gets worse.  You develop increasing weakness in your legs.  You lose the ability to control your bladder or bowel.  You have difficulty walking or  balancing.  You have a fever.   This information is not intended to replace advice given to you by your health care provider. Make sure you discuss any questions you have with your health care provider.   Document Released: 08/04/2005 Document Revised: 12/19/2014 Document Reviewed: 07/31/2014 Elsevier Interactive Patient Education [employer]2016 Elsevier Inc.

## 2016-01-18 ENCOUNTER — Encounter (HOSPITAL_COMMUNITY): Payer: Self-pay | Admitting: *Deleted

## 2016-01-18 ENCOUNTER — Emergency Department (HOSPITAL_COMMUNITY)
Admission: EM | Admit: 2016-01-18 | Discharge: 2016-01-18 | Disposition: A | Discharge status: Home / Self Care | Payer: Self-pay | Attending: Emergency Medicine | Admitting: Emergency Medicine

## 2016-01-18 DIAGNOSIS — F17210 Nicotine dependence, cigarettes, uncomplicated: Secondary | ICD-10-CM | POA: Insufficient documentation

## 2016-01-18 DIAGNOSIS — M25512 Pain in left shoulder: Secondary | ICD-10-CM | POA: Insufficient documentation

## 2016-01-18 DIAGNOSIS — M25522 Pain in left elbow: Secondary | ICD-10-CM | POA: Insufficient documentation

## 2016-01-18 DIAGNOSIS — M79602 Pain in left arm: Secondary | ICD-10-CM

## 2016-01-18 DIAGNOSIS — Z791 Long term (current) use of non-steroidal anti-inflammatories (NSAID): Secondary | ICD-10-CM | POA: Insufficient documentation

## 2016-01-18 DIAGNOSIS — Z79899 Other long term (current) drug therapy: Secondary | ICD-10-CM | POA: Insufficient documentation

## 2016-01-18 DIAGNOSIS — M79662 Pain in left lower leg: Secondary | ICD-10-CM | POA: Insufficient documentation

## 2016-01-18 NOTE — ED Notes (Signed)
Bed: WA12 Expected date:  Expected time:  Means of arrival:  Comments: EMS 

## 2016-01-18 NOTE — ED Notes (Signed)
Per [facility], pt was in a stairwell of some apts., security called for EMS.  Pt c/o left arm & left leg pain.
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
## 2016-01-24 ENCOUNTER — Encounter (HOSPITAL_COMMUNITY): Payer: Self-pay | Admitting: Emergency Medicine

## 2016-01-24 ENCOUNTER — Emergency Department (HOSPITAL_COMMUNITY)
Admission: EM | Admit: 2016-01-24 | Discharge: 2016-01-25 | Disposition: A | Discharge status: Home / Self Care | Payer: Medicaid Other | Attending: Emergency Medicine | Admitting: Emergency Medicine

## 2016-01-24 DIAGNOSIS — Z79899 Other long term (current) drug therapy: Secondary | ICD-10-CM | POA: Insufficient documentation

## 2016-01-24 DIAGNOSIS — M79604 Pain in right leg: Secondary | ICD-10-CM | POA: Insufficient documentation

## 2016-01-24 DIAGNOSIS — M79605 Pain in left leg: Secondary | ICD-10-CM | POA: Insufficient documentation

## 2016-01-24 LAB — CBC WITH DIFFERENTIAL/PLATELET
BASOS ABS: 0 10*3/uL (ref 0.0–0.1)
Basophils Relative: 0 %
EOS PCT: 1 %
Eosinophils Absolute: 0.1 10*3/uL (ref 0.0–0.7)
HCT: 38.9 % — ABNORMAL LOW (ref 39.0–52.0)
Hemoglobin: 13.6 g/dL (ref 13.0–17.0)
LYMPHS PCT: 31 %
Lymphs Abs: 2 10*3/uL (ref 0.7–4.0)
MCH: 28.8 pg (ref 26.0–34.0)
MCHC: 35 g/dL (ref 30.0–36.0)
MCV: 82.2 fL (ref 78.0–100.0)
MONO ABS: 0.5 10*3/uL (ref 0.1–1.0)
MONOS PCT: 8 %
Neutro Abs: 3.9 10*3/uL (ref 1.7–7.7)
Neutrophils Relative %: 60 %
Platelets: 175 10*3/uL (ref 150–400)
RBC: 4.73 MIL/uL (ref 4.22–5.81)
RDW: 13.1 % (ref 11.5–15.5)
WBC: 6.4 10*3/uL (ref 4.0–10.5)

## 2016-01-24 MED ORDER — SODIUM CHLORIDE 0.9 % IV BOLUS (SEPSIS)
1000.0000 mL | Freq: Once | INTRAVENOUS | Status: AC
  Administered 2016-01-24: 1000 mL via INTRAVENOUS

## 2016-01-24 NOTE — ED Notes (Signed)
Per EMS, pt from home, c/o bilateral leg pain. [facility], pt normally seen in this state there. R foot appears to be bandaged, pt does a lot of walking. Pt sts pain feels like leg cramps. Pt sts he has not been drinking much water or staying hydrated. A&Ox4 and ambulatory with pain.

## 2016-01-24 NOTE — ED Notes (Signed)
Pt c/o sudden onset bilateral leg pain x 1 hour. Pt has hx of leg pain. Pt has hx of homelessness and does a lot of walking. Pt also c/o dehydration and sts he hasn't had any water to drink in "awhile." Pt c/o numbness but then c/o 13/10 pain while sitting, worsening to palpation and movement. A&Ox4. Pt seems to be a poor historian.

## 2016-01-24 NOTE — ED Provider Notes (Signed)
CSN: [id number]     Arrival date & time 01/24/16  1858 History   By signing my name below, I, Suzan SlickAshley N. Elon SpannerLeger, attest that this documentation has been prepared under the direction and in the presence of Laurence Spatesachel Morgan Sandro Burgo, MD.  Electronically Signed: Suzan SlickAshley N. Elon SpannerLeger, ED Scribe. 01/24/2016. 11:44 PM.   Chief Complaint  Patient presents with  . Leg Pain   The history is provided by the patient. No language interpreter was used.    HPI Comments: Collin White is a 20 y.o. male without any pertinent past medical history who presents to the Emergency Department complaining of constant, sudden onset bilateral leg pain that radiates up to thighs onset 1 hour prior to arrival. Pt states he is currently homeless and does a lot of walking.  No prior history or FH of blood clots. He admits he has not been drinking as much and feels he is may be dehydrated. No recent fever, chills, nausea, or vomiting. No other complaints other than blister on his left heel.  PCP: No PCP Per Patient    Past Medical History  Diagnosis Date  . Seizures [facility](HCC)    Past Surgical History  Procedure Laterality Date  . No past surgeries  patient stated he had facial reconstruction surgery as a child    Family History  Problem Relation Age of Onset  . Mental illness Neg Hx    Social History  Substance Use Topics  . Smoking status: Never Smoker   . Smokeless tobacco: None  . Alcohol Use: No    Review of Systems   A complete 10 system review of systems was obtained and all systems are negative except as noted in the HPI and PMH.     Allergies  Review of patient's allergies indicates no known allergies.  Home Medications   Prior to Admission medications   Medication Sig Start Date End Date Taking? Authorizing Provider  benztropine (COGENTIN) 1 MG tablet Take 1 tablet (1 mg total) by mouth 2 (two) times daily. Patient not taking: Reported on 01/24/2016 10/02/15   Adonis BrookSheila Agustin, NP  haloperidol (HALDOL) 5 MG  tablet Take on tablet (5 mg) in the morning.  Take 2 tabs (10 mg) at bedtime. Patient not taking: Reported on 01/24/2016 10/02/15   Adonis BrookSheila Agustin, NP  hydrOXYzine (ATARAX/VISTARIL) 25 MG tablet Take 1 tablet (25 mg total) by mouth 3 (three) times daily as needed for anxiety. Patient not taking: Reported on 01/24/2016 10/02/15   Adonis BrookSheila Agustin, NP  Oxcarbazepine (TRILEPTAL) 300 MG tablet Take 1 tablet (300 mg total) by mouth 2 (two) times daily. Patient not taking: Reported on 01/24/2016 10/02/15   Adonis BrookSheila Agustin, NP   Triage Vitals: BP 94/58 mmHg  Pulse 68  Temp(Src) 98.4 F (36.9 C) (Oral)  Resp 15  SpO2 96%    Physical Exam  Constitutional: He is oriented to person, place, and time. He appears well-developed and well-nourished. No distress.  Shivering but NAD  HENT:  Head: Normocephalic and atraumatic.  Moist mucous membranes  Eyes: Pupils are equal, round, and reactive to light.  conjunctival injection   Neck: Neck supple.  Cardiovascular: Normal rate, regular rhythm, normal heart sounds and intact distal pulses.   No murmur heard. Pulmonary/Chest: Effort normal and breath sounds normal.  Abdominal: Soft. Bowel sounds are normal. He exhibits no distension. There is no tenderness.  Musculoskeletal: He exhibits no edema or tenderness.  No focal swelling or skin changes on bilateral legs  Neurological: He is alert  and oriented to person, place, and time.  Fluent speech Normal sensation BLE  Skin: Skin is warm and dry.  Small ruptured blister on R heel  Psychiatric:  Disheveled  Avoids eye contact   Nursing note and vitals reviewed.   ED Course  Procedures (including critical care time)  DIAGNOSTIC STUDIES: Oxygen Saturation is 100% on RA, Normal by my interpretation.    COORDINATION OF CARE: 11:39 PM- Will order blood work. Will give fluids. Discussed treatment plan with pt at bedside and pt agreed to plan.     Labs Review Labs Reviewed  COMPREHENSIVE METABOLIC PANEL -  Abnormal; Notable for the following:    ALT 13 (*)    All other components within normal limits  CBC WITH DIFFERENTIAL/PLATELET - Abnormal; Notable for the following:    HCT 38.9 (*)    All other components within normal limits  ETHANOL    Imaging Review No results found. I have personally reviewed and evaluated these lab results as part of my medical decision-making.   EKG Interpretation None     Medications  sodium chloride 0.9 % bolus 1,000 mL (0 mLs Intravenous Stopped 01/25/16 0056)  ketorolac (TORADOL) 30 MG/ML injection 30 mg (30 mg Intravenous Given 01/25/16 0015)    MDM   Final diagnoses:  Bilateral leg pain   Patient with bilateral leg pain that feels like cramps. Patient admits to not drinking water and frequent ambulation as he is homeless. On exam, he was disheveled but no acute distress. Vital signs unremarkable. No skin changes, swelling, joint tenderness, or weakness of extremities on exam. Normal sensation bilaterally. Patient ambulatory in the ED. Obtained above lab work to evaluate for dehydration or evidence of infection. Labwork unremarkable. Gave the patient an IV fluid bolus and Toradol. On reexamination, he was resting comfortably. I see no evidence of infection, no back pain or leg weakness to suggest cauda equina. Instructed on supportive care and return precautions. patient discharged in satisfactory condition.  I personally performed the services described in this documentation, which was scribed in my presence. The recorded information has been reviewed and is accurate.   Laurence Spates, MD 01/25/16 [phone]

## 2016-01-25 LAB — COMPREHENSIVE METABOLIC PANEL
ALBUMIN: 4.2 g/dL (ref 3.5–5.0)
ALT: 13 U/L — ABNORMAL LOW (ref 17–63)
AST: 23 U/L (ref 15–41)
Alkaline Phosphatase: 78 U/L (ref 38–126)
Anion gap: 6 (ref 5–15)
BUN: 18 mg/dL (ref 6–20)
CO2: 27 mmol/L (ref 22–32)
Calcium: 9.3 mg/dL (ref 8.9–10.3)
Chloride: 109 mmol/L (ref 101–111)
Creatinine, Ser: 1.08 mg/dL (ref 0.61–1.24)
GFR calc non Af Amer: >60 mL/min (ref >60)
GLUCOSE: 88 mg/dL (ref 65–99)
POTASSIUM: 3.7 mmol/L (ref 3.5–5.1)
SODIUM: 142 mmol/L (ref 135–145)
TOTAL PROTEIN: 7.2 g/dL (ref 6.5–8.1)
Total Bilirubin: 0.9 mg/dL (ref 0.3–1.2)

## 2016-01-25 LAB — ETHANOL: Alcohol, Ethyl (B): <5 mg/dL (ref <5)

## 2016-01-25 MED ORDER — KETOROLAC TROMETHAMINE 30 MG/ML IJ SOLN
30.0000 mg | Freq: Once | INTRAMUSCULAR | Status: AC
  Administered 2016-01-25: 30 mg via INTRAVENOUS
  Filled 2016-01-25: qty 1

## 2016-01-25 NOTE — ED Notes (Signed)
Pt ambulated with shuffling gait and pain reported

## 2016-01-25 NOTE — Discharge Instructions (Signed)

## 2016-02-15 ENCOUNTER — Encounter (HOSPITAL_COMMUNITY): Payer: Self-pay | Admitting: Emergency Medicine

## 2016-02-15 ENCOUNTER — Emergency Department (HOSPITAL_COMMUNITY)
Admission: EM | Admit: 2016-02-15 | Discharge: 2016-02-15 | Disposition: A | Discharge status: Home / Self Care | Payer: Medicaid Other | Attending: Emergency Medicine | Admitting: Emergency Medicine

## 2016-02-15 DIAGNOSIS — G243 Spasmodic torticollis: Secondary | ICD-10-CM | POA: Insufficient documentation

## 2016-02-15 MED ORDER — NAPROXEN 375 MG PO TABS: 375.0000 mg | ORAL_TABLET | Freq: Two times a day (BID) | ORAL | Status: DC

## 2016-02-15 MED ORDER — CYCLOBENZAPRINE HCL 5 MG PO TABS
5.0000 mg | ORAL_TABLET | Freq: Two times a day (BID) | ORAL | Status: DC | PRN
Start: 2016-02-15 — End: 2017-04-27

## 2016-02-15 NOTE — ED Notes (Signed)
Patient found lying supine at apartment complex, c/o chronic nack pain and left lateral hand pain. A&O x4, ambulatory with steady gait.

## 2016-02-15 NOTE — ED Notes (Signed)
Bed: WTR5 Expected date:  Expected time:  Means of arrival:  Comments: Pt in room 

## 2016-02-15 NOTE — Discharge Instructions (Signed)
Acute Torticollis Torticollis is a condition in which the muscles of the neck tighten (contract) abnormally. It may be difficult to turn your neck. CAUSES This condition may be caused by:  Sleeping in an awkward position (common).  Extending or twisting the neck muscles beyond their normal position.  Infection. In some cases, the cause may not be known. SYMPTOMS Symptoms of this condition include:  An unnatural position of the head.  Neck pain.  A limited ability to move the neck.  Twisting of the neck to one side. DIAGNOSIS This condition is diagnosed with a physical exam.  TREATMENT Treatment for this condition involves trying to relax the neck muscles. It may include:  Medicines or shots.  Physical therapy.  Surgery. This may be done in severe cases. HOME CARE INSTRUCTIONS  Take medicines only as directed by your health care provider.  Do stretching exercises and massage your neck as directed by your health care provider.  Keep all follow-up visits as directed by your health care provider. This is important. SEEK MEDICAL CARE IF:  You develop a fever. SEEK IMMEDIATE MEDICAL CARE IF:  You develop difficulty breathing.  You develop noisy breathing (stridor).  You start drooling.  You have trouble swallowing or have pain with swallowing.  You develop numbness or weakness in your hands or feet.  You have changes in your speech, understanding, or vision.  Your pain gets worse.   This information is not intended to replace advice given to you by your health care provider. Make sure you discuss any questions you have with your health care provider.   Document Released: 08/01/2000 Document Revised: 12/19/2014 Document Reviewed: 07/31/2014 Elsevier Interactive Patient Education [employer]2016 Elsevier Inc.
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
## 2016-02-18 ENCOUNTER — Encounter (HOSPITAL_COMMUNITY): Payer: Self-pay | Admitting: Emergency Medicine

## 2016-02-27 ENCOUNTER — Emergency Department (HOSPITAL_COMMUNITY): Payer: Self-pay

## 2016-02-27 ENCOUNTER — Emergency Department (HOSPITAL_COMMUNITY)
Admission: EM | Admit: 2016-02-27 | Discharge: 2016-02-27 | Disposition: A | Discharge status: Home / Self Care | Payer: Self-pay | Attending: Emergency Medicine | Admitting: Emergency Medicine

## 2016-02-27 ENCOUNTER — Encounter (HOSPITAL_COMMUNITY): Payer: Self-pay | Admitting: *Deleted

## 2016-02-27 DIAGNOSIS — Y999 Unspecified external cause status: Secondary | ICD-10-CM | POA: Insufficient documentation

## 2016-02-27 DIAGNOSIS — Y9301 Activity, walking, marching and hiking: Secondary | ICD-10-CM | POA: Insufficient documentation

## 2016-02-27 DIAGNOSIS — M25521 Pain in right elbow: Secondary | ICD-10-CM | POA: Insufficient documentation

## 2016-02-27 DIAGNOSIS — Y929 Unspecified place or not applicable: Secondary | ICD-10-CM | POA: Insufficient documentation

## 2016-02-27 DIAGNOSIS — W19XXXA Unspecified fall, initial encounter: Secondary | ICD-10-CM

## 2016-02-27 DIAGNOSIS — Z79899 Other long term (current) drug therapy: Secondary | ICD-10-CM | POA: Insufficient documentation

## 2016-02-27 DIAGNOSIS — Z8669 Personal history of other diseases of the nervous system and sense organs: Secondary | ICD-10-CM | POA: Insufficient documentation

## 2016-02-27 DIAGNOSIS — W108XXA Fall (on) (from) other stairs and steps, initial encounter: Secondary | ICD-10-CM | POA: Insufficient documentation

## 2016-02-27 NOTE — Discharge Instructions (Signed)
May take tylenol or motrin as needed for pain. [facility]-- this is a free clinic. Return here for new concerns.

## 2016-02-27 NOTE — ED Notes (Signed)
Bed: IO96WA26 Expected date:  Expected time:  Means of arrival:  Comments: EMS 20 yo M elbow pain

## 2016-02-27 NOTE — ED Notes (Addendum)
Pt s/p a fall this am and landed on his right elbow,.Positive radial pulse. No deformity noted. Pt stated he was hungry and thirst. Pt give na sandwich and a gatorade to drink.
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
## 2016-03-06 ENCOUNTER — Encounter (HOSPITAL_COMMUNITY): Payer: Self-pay | Admitting: *Deleted

## 2016-03-06 ENCOUNTER — Emergency Department (HOSPITAL_COMMUNITY): Payer: Self-pay

## 2016-03-06 ENCOUNTER — Emergency Department (HOSPITAL_COMMUNITY)
Admission: EM | Admit: 2016-03-06 | Discharge: 2016-03-06 | Disposition: A | Discharge status: Home / Self Care | Payer: Self-pay | Attending: Emergency Medicine | Admitting: Emergency Medicine

## 2016-03-06 DIAGNOSIS — J0110 Acute frontal sinusitis, unspecified: Secondary | ICD-10-CM | POA: Insufficient documentation

## 2016-03-06 MED ORDER — FLUORESCEIN SODIUM 1 MG OP STRP
1.0000 | ORAL_STRIP | Freq: Once | OPHTHALMIC | Status: AC
  Administered 2016-03-06: 1 via OPHTHALMIC
  Filled 2016-03-06: qty 1

## 2016-03-06 MED ORDER — IBUPROFEN 600 MG PO TABS: 600.0000 mg | ORAL_TABLET | Freq: Four times a day (QID) | ORAL | Status: DC | PRN

## 2016-03-06 MED ORDER — TETRACAINE HCL 0.5 % OP SOLN
2.0000 [drp] | Freq: Once | OPHTHALMIC | Status: AC
  Administered 2016-03-06: 2 [drp] via OPHTHALMIC
  Filled 2016-03-06: qty 4

## 2016-03-06 MED ORDER — AMOXICILLIN 500 MG PO CAPS: 500.0000 mg | ORAL_CAPSULE | Freq: Three times a day (TID) | ORAL | Status: DC

## 2016-03-06 MED ORDER — KETOROLAC TROMETHAMINE 60 MG/2ML IM SOLN
60.0000 mg | Freq: Once | INTRAMUSCULAR | Status: AC
  Administered 2016-03-06: 60 mg via INTRAMUSCULAR
  Filled 2016-03-06: qty 2

## 2016-03-06 NOTE — ED Notes (Signed)
Pt was given new tennis shoes as the ones he came with were worn through and had 2 large holes in the bottom
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
## 2016-03-06 NOTE — Discharge Instructions (Signed)
Ibuprofen for pain. Amoxil for infection. Take until all gone. Follow up with family doctor. Return if worsening.    Sinusitis, Adult Sinusitis is redness, soreness, and inflammation of the paranasal sinuses. Paranasal sinuses are air pockets within the bones of your face. They are located beneath your eyes, in the middle of your forehead, and above your eyes. In healthy paranasal sinuses, mucus is able to drain out, and air is able to circulate through them by way of your nose. However, when your paranasal sinuses are inflamed, mucus and air can become trapped. This can allow bacteria and other germs to grow and cause infection. Sinusitis can develop quickly and last only a short time (acute) or continue over a long period (chronic). Sinusitis that lasts for more than 12 weeks is considered chronic. CAUSES Causes of sinusitis include:  Allergies.  Structural abnormalities, such as displacement of the cartilage that separates your nostrils (deviated septum), which can decrease the air flow through your nose and sinuses and affect sinus drainage.  Functional abnormalities, such as when the small hairs (cilia) that line your sinuses and help remove mucus do not work properly or are not present. SIGNS AND SYMPTOMS Symptoms of acute and chronic sinusitis are the same. The primary symptoms are pain and pressure around the affected sinuses. Other symptoms include:  Upper toothache.  Earache.  Headache.  Bad breath.  Decreased sense of smell and taste.  A cough, which worsens when you are lying flat.  Fatigue.  Fever.  Thick drainage from your nose, which often is green and may contain pus (purulent).  Swelling and warmth over the affected sinuses. DIAGNOSIS Your health care provider will perform a physical exam. During your exam, your health care provider may perform any of the following to help determine if you have acute sinusitis or chronic sinusitis:  Look in your nose for signs  of abnormal growths in your nostrils (nasal polyps).  Tap over the affected sinus to check for signs of infection.  View the inside of your sinuses using an imaging device that has a light attached (endoscope). If your health care provider suspects that you have chronic sinusitis, one or more of the following tests may be recommended:  Allergy tests.  Nasal culture. A sample of mucus is taken from your nose, sent to a lab, and screened for bacteria.  Nasal cytology. A sample of mucus is taken from your nose and examined by your health care provider to determine if your sinusitis is related to an allergy. TREATMENT Most cases of acute sinusitis are related to a viral infection and will resolve on their own within 10 days. Sometimes, medicines are prescribed to help relieve symptoms of both acute and chronic sinusitis. These may include pain medicines, decongestants, nasal steroid sprays, or saline sprays. However, for sinusitis related to a bacterial infection, your health care provider will prescribe antibiotic medicines. These are medicines that will help kill the bacteria causing the infection. Rarely, sinusitis is caused by a fungal infection. In these cases, your health care provider will prescribe antifungal medicine. For some cases of chronic sinusitis, surgery is needed. Generally, these are cases in which sinusitis recurs more than 3 times per year, despite other treatments. HOME CARE INSTRUCTIONS  Drink plenty of water. Water helps thin the mucus so your sinuses can drain more easily.  Use a humidifier.  Inhale steam 3-4 times a day (for example, sit in the bathroom with the shower running).  Apply a warm, moist washcloth to  your face 3-4 times a day, or as directed by your health care provider.  Use saline nasal sprays to help moisten and clean your sinuses.  Take medicines only as directed by your health care provider.  If you were prescribed either an antibiotic or  antifungal medicine, finish it all even if you start to feel better. SEEK IMMEDIATE MEDICAL CARE IF:  You have increasing pain or severe headaches.  You have nausea, vomiting, or drowsiness.  You have swelling around your face.  You have vision problems.  You have a stiff neck.  You have difficulty breathing.   This information is not intended to replace advice given to you by your health care provider. Make sure you discuss any questions you have with your health care provider.   Document Released: 08/04/2005 Document Revised: 08/25/2014 Document Reviewed: 08/19/2011 Elsevier Interactive Patient Education [employer]2016 Elsevier Inc.

## 2016-03-06 NOTE — Progress Notes (Signed)
ED CM consulted by ED RN with concern for pt needing shoes  Cm assisted with getting contact numbers for [facility] chaplin services  CM spoke with pt who [place].  CM discussed and provided written information to assist pt with determining choice for uninsured accepting pcps, discussed the importance of pcp vs EDP services for f/u care, www.needymeds.org, www.goodrx.com, discounted pharmacies and other [place] resources such as [facility] , [facility], affordable care act, financial assistance, uninsured dental services, [place] med assist, DSS and  health department  Reviewed resources for [place] uninsured accepting pcps like [facility], [facility] medicine [facility] [address], [facility], [facility], local urgent care centers, [facility], [facility], general medical clinics, family services of the [place], [facility] urgent care plus others, medication resources, [facility] out patient pharmacies and housing Pt voiced understanding and appreciation of resources provided   Provided [facility] contact information Pt agreed to a referral Cm completed referral Pt to be contact by [facility] clinical liason

## 2016-03-06 NOTE — ED Provider Notes (Signed)
CSN: [id number]     Arrival date & time 03/06/16  1340 History   First MD Initiated Contact with Patient 03/06/16 1357     Chief Complaint  Patient presents with  . Headache     (Consider location/radiation/quality/duration/timing/severity/associated sxs/prior Treatment) HPI Collin White is a 20 y.o. male with history of seizures and facial reconstruction, presents to emergency department complaining of right-sided headache. Pain around right eye, right temple, reports associated rhinorrhea and watering of the eye. Denies eye injury. Denies blurred vision. No photophobi.  Denies head injuries. He has not tried any medications. No history of similar pain in the past. Reports history of facial surgeries, feels like may be related.   Past Medical History  Diagnosis Date  . Seizures [facility])    Past Surgical History  Procedure Laterality Date  . 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 Facial reconstructive surgery    . Abd surgery s/p traumatic event    . No past surgeries  patient stated he had facial reconstruction surgery as a child    Family History  Problem Relation Age of Onset  . Mental illness Neg Hx    Social History  Substance Use Topics  . Smoking status: Never Smoker   . Smokeless tobacco: None  . Alcohol Use: No    Review of Systems  Constitutional: Negative for fever and chills.  HENT: Positive for congestion and sinus pressure.   Eyes: Positive for photophobia, pain and redness.  Respiratory: Negative for cough, chest tightness and shortness of breath.   Cardiovascular: Negative for chest pain, palpitations and leg swelling.  Genitourinary: Negative for dysuria, urgency, frequency and hematuria.  Musculoskeletal: Negative for myalgias, arthralgias, neck pain and neck stiffness.  Skin: Negative for rash.  Allergic/Immunologic: Negative for immunocompromised state.  Neurological: Positive for headaches. Negative for dizziness, weakness, light-headedness and numbness.       Allergies  Review of patient's allergies indicates no known allergies.  Home Medications   Prior to Admission medications   Medication Sig Start Date End Date Taking? Authorizing Provider  benztropine (COGENTIN) 1 MG tablet Take 1 tablet (1 mg total) by mouth 2 (two) times daily. Patient not taking: Reported on 01/24/2016 10/02/15   Adonis Brook, NP  cyclobenzaprine (FLEXERIL) 5 MG tablet Take 1 tablet (5 mg total) by mouth 2 (two) times daily as needed for muscle spasms. Patient not taking: Reported on 03/06/2016 02/15/16   Arthor Captain, PA-C  docusate sodium (COLACE) 100 MG capsule Take 1 capsule (100 mg total) by mouth every 12 (twelve) hours. Patient not taking: Reported on 03/06/2016 12/13/15   Derwood Kaplan, MD  haloperidol (HALDOL) 5 MG tablet Take on tablet (5 mg) in the morning.  Take 2 tabs (10 mg) at bedtime. Patient not taking: Reported on 01/24/2016 10/02/15   Adonis Brook, NP  hydrOXYzine (ATARAX/VISTARIL) 25 MG tablet Take 1 tablet (25 mg total) by mouth 3 (three) times daily as needed for anxiety. Patient not taking: Reported on 01/24/2016 10/02/15   Adonis Brook, NP  lidocaine (XYLOCAINE) 5 % ointment Apply 1 application topically as needed. Patient not taking: Reported on 03/06/2016 12/13/15   Derwood Kaplan, MD  naproxen (NAPROSYN) 375 MG tablet Take 1 tablet (375 mg total) by mouth 2 (two) times daily. Patient not taking: Reported on 03/06/2016 02/15/16   Arthor Captain, PA-C  naproxen (NAPROSYN) 500 MG tablet Take 1 tablet (500 mg total) by mouth 2 (two) times daily. Patient not taking: Reported on 03/06/2016 12/14/15  Derwood KaplanAnkit Nanavati, MD  Oxcarbazepine (TRILEPTAL) 300 MG tablet Take 1 tablet (300 mg total) by mouth 2 (two) times daily. Patient not taking: Reported on 01/24/2016 10/02/15   Adonis BrookSheila Agustin, NP   BP 110/66 mmHg  Pulse 60  Temp(Src) 99 F (37.2 C) (Oral)  Resp 16  SpO2 98% Physical Exam  Constitutional: He is oriented to person, place, and time. He  appears well-developed and well-nourished. No distress.  Deshelved.   HENT:  Head: Normocephalic and atraumatic.  Right Ear: Tympanic membrane, external ear and ear canal normal.  Left Ear: Tympanic membrane, external ear and ear canal normal.  Nose: Right sinus exhibits maxillary sinus tenderness and frontal sinus tenderness. Left sinus exhibits no maxillary sinus tenderness and no frontal sinus tenderness.  Eyes: EOM are normal. Pupils are equal, round, and reactive to light. Right eye exhibits discharge. Left eye exhibits no discharge.  Mild swelling to the right eyelid with no tenderness. Conjunctivae injected. Tearing. Bilateral nasal mucosal edema with rhinorrhea.  Neck: Normal range of motion. Neck supple.  Cardiovascular: Normal rate, regular rhythm and normal heart sounds.   Pulmonary/Chest: Effort normal and breath sounds normal. No respiratory distress. He has no wheezes. He has no rales.  Abdominal: Soft. Bowel sounds are normal. He exhibits no distension. There is no tenderness. There is no rebound.  Musculoskeletal: He exhibits no edema.  Neurological: He is alert and oriented to person, place, and time.  5/5 and equal upper and lower extremity strength bilaterally. Equal grip strength bilaterally. Normal finger to nose and heel to shin. No pronator drift. Patellar reflexes 2+   Skin: Skin is warm and dry.  Nursing note and vitals reviewed.   ED Course  Procedures (including critical care time) Labs Review Labs Reviewed - No data to display  Imaging Review Ct Head Wo Contrast  03/06/2016  CLINICAL DATA:  Persistent headaches over the past few days. EXAM: CT HEAD WITHOUT CONTRAST TECHNIQUE: Contiguous axial images were obtained from the base of the skull through the vertex without intravenous contrast. COMPARISON:  None. FINDINGS: All The ventricles are normal in size and configuration. No extra-axial fluid collections are identified. The gray-white differentiation is normal.  No CT findings for acute intracranial process such as hemorrhage or infarction. No mass lesions. The brainstem and cerebellum are grossly normal. The bony structures are intact. The paranasal sinuses and mastoid air cells are clear except for mucoperiosteal thickening involving the right frontal sinus. The globes are intact. IMPRESSION: 1. No acute intracranial findings or mass lesion. 2. Mucoperiosteal thickening noted in the right frontal sinus. Electronically Signed   By: Rudie MeyerP.  Gallerani M.D.   On: 03/06/2016 15:58   I have personally reviewed and evaluated these images and lab results as part of my medical decision-making.   EKG Interpretation None      MDM   Final diagnoses:  Acute frontal sinusitis, recurrence not specified   Patient emergency department with right facial and headache pain. Patient does have some conjunctival injection and tenderness over sinuses. Initially considered cluster headache given rhinorrhea, lacrimation, right conjunctival injection and right-sided headache. Placed on high flow oxygen and given Toradol for pain. Patient actually states he felt much better. CT of the head obtained and showed right frontal sinus mucosal and periosteal thickening. This is most consistent with sinusitis. I will place him on antibiotic. Will have him follow-up with a family doctor. At this time normal vital signs, no acute distress, was able to eat and drink and department.  Filed Vitals:   03/06/16 1348  BP: 110/66  Pulse: 60  Temp: 99 F (37.2 C)  TempSrc: Oral  Resp: 16  SpO2: 98%       Jaynie Crumble, PA-C 03/06/16 1632   Jacalyn Lefevre, MD 03/11/16 1429

## 2017-03-02 ENCOUNTER — Encounter (HOSPITAL_COMMUNITY): Payer: Self-pay

## 2017-03-02 ENCOUNTER — Emergency Department (HOSPITAL_COMMUNITY)
Admission: EM | Admit: 2017-03-02 | Discharge: 2017-03-03 | Disposition: A | Discharge status: Home / Self Care | Payer: Medicaid Other | Attending: Emergency Medicine | Admitting: Emergency Medicine

## 2017-03-02 DIAGNOSIS — R109 Unspecified abdominal pain: Secondary | ICD-10-CM | POA: Insufficient documentation

## 2017-03-02 DIAGNOSIS — R197 Diarrhea, unspecified: Secondary | ICD-10-CM | POA: Insufficient documentation

## 2017-03-02 DIAGNOSIS — R0789 Other chest pain: Secondary | ICD-10-CM | POA: Insufficient documentation

## 2017-03-02 DIAGNOSIS — R5383 Other fatigue: Secondary | ICD-10-CM | POA: Insufficient documentation

## 2017-03-02 DIAGNOSIS — R110 Nausea: Secondary | ICD-10-CM | POA: Insufficient documentation

## 2017-03-02 DIAGNOSIS — F79 Unspecified intellectual disabilities: Secondary | ICD-10-CM | POA: Insufficient documentation

## 2017-03-02 LAB — COMPREHENSIVE METABOLIC PANEL
ALK PHOS: 125 U/L (ref 38–126)
ALT: 15 U/L — ABNORMAL LOW (ref 17–63)
ANION GAP: 9 (ref 5–15)
AST: 28 U/L (ref 15–41)
Albumin: 4.8 g/dL (ref 3.5–5.0)
BUN: 16 mg/dL (ref 6–20)
CALCIUM: 9.8 mg/dL (ref 8.9–10.3)
CO2: 26 mmol/L (ref 22–32)
Chloride: 105 mmol/L (ref 101–111)
Creatinine, Ser: 1.07 mg/dL (ref 0.61–1.24)
GFR calc Af Amer: >60 mL/min (ref >60)
GFR calc non Af Amer: >60 mL/min (ref >60)
Glucose, Bld: 91 mg/dL (ref 65–99)
POTASSIUM: 4.4 mmol/L (ref 3.5–5.1)
SODIUM: 140 mmol/L (ref 135–145)
Total Bilirubin: 0.6 mg/dL (ref 0.3–1.2)
Total Protein: 8.6 g/dL — ABNORMAL HIGH (ref 6.5–8.1)

## 2017-03-02 LAB — CBC WITH DIFFERENTIAL/PLATELET
BASOS PCT: 0 %
Basophils Absolute: 0 10*3/uL (ref 0.0–0.1)
EOS ABS: 0.1 10*3/uL (ref 0.0–0.7)
EOS PCT: 1 %
HCT: 44.8 % (ref 39.0–52.0)
Hemoglobin: 15.6 g/dL (ref 13.0–17.0)
LYMPHS ABS: 2.6 10*3/uL (ref 0.7–4.0)
Lymphocytes Relative: 35 %
MCH: 29.2 pg (ref 26.0–34.0)
MCHC: 34.8 g/dL (ref 30.0–36.0)
MCV: 83.7 fL (ref 78.0–100.0)
MONO ABS: 0.4 10*3/uL (ref 0.1–1.0)
Monocytes Relative: 6 %
Neutro Abs: 4.4 10*3/uL (ref 1.7–7.7)
Neutrophils Relative %: 58 %
Platelets: 206 10*3/uL (ref 150–400)
RBC: 5.35 MIL/uL (ref 4.22–5.81)
RDW: 13.1 % (ref 11.5–15.5)
WBC: 7.5 10*3/uL (ref 4.0–10.5)

## 2017-03-02 NOTE — ED Provider Notes (Signed)
[facility]-EMERGENCY DEPT Provider Note   CSN: [id number] Arrival date & time: 03/02/17  1731     History   Chief Complaint Chief Complaint  Patient presents with  . Fatigue    HPI Collin White is a 21 y.o. male who presents with feeling nauseous, abdominal pain, and diarrhea for over one week.  He reports intermittent chest and abdominal pain.  He reports that he was walking down town and wasn't feeling well so he went to a gas station, bought some peanuts but still wasn't feeling well so he called [employer].  He reports that his right eye is twitching, but Reports that that is normal for him and unchanged.  He initially reports that he is having multiple "healthy" poops and denies constipation. When asked to clarify what healthy poops were he says that they have been soft and loose, he is unsure for how long or how many episodes he has had. Patient is a poor historian.  He also reports that sometimes when he is talking he feels like he gets air stuck in his throat and has to stop talking.  He reports the right side of his abdomen felt nauseous but the left side feels ok. He denies nausea currently.  He reports that he lives with his cousin. He reports that he does not take any medications anymore. When asked to clarify he says that he used to take medications, but was unsure what they were for. Review shows that he has diagnoses of borderline intellectual functioning, psychoses, and schizophrenia, and seizures. Chart review shows that last reported seizure was in 2007.  HPI  Past Medical History:  Diagnosis Date  . Seizures [facility])     Patient Active Problem List   Diagnosis Date Noted  . 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 Psychoses   . Schizophrenia (HCC) 09/29/2015  . Cannabis use disorder, moderate, in early remission (HCC) 09/26/2015  . History of posttraumatic stress disorder (PTSD) 09/26/2015    Past Surgical History:  Procedure Laterality Date  . abd surgery s/p  traumatic event    . facial reconstructive surgery    . NO PAST SURGERIES  patient stated he had facial reconstruction surgery as a child   . tumor removed from head          Home Medications    Prior to Admission medications   Medication Sig Start Date End Date Taking? Authorizing Provider  amoxicillin (AMOXIL) 500 MG capsule Take 1 capsule (500 mg total) by mouth 3 (three) times daily. 03/06/16   Kirichenko, Tatyana, PA-C  benztropine (COGENTIN) 1 MG tablet Take 1 tablet (1 mg total) by mouth 2 (two) times daily. Patient not taking: Reported on 01/24/2016 10/02/15   Adonis Brook, NP  cyclobenzaprine (FLEXERIL) 5 MG tablet Take 1 tablet (5 mg total) by mouth 2 (two) times daily as needed for muscle spasms. Patient not taking: Reported on 03/06/2016 02/15/16   Arthor Captain, PA-C  docusate sodium (COLACE) 100 MG capsule Take 1 capsule (100 mg total) by mouth every 12 (twelve) hours. Patient not taking: Reported on 03/06/2016 12/13/15   Derwood Kaplan, MD  haloperidol (HALDOL) 5 MG tablet Take on tablet (5 mg) in the morning.  Take 2 tabs (10 mg) at bedtime. Patient not taking: Reported on 01/24/2016 10/02/15   Adonis Brook, NP  hydrOXYzine (ATARAX/VISTARIL) 25 MG tablet Take 1 tablet (25 mg total) by mouth 3 (three) times daily as needed for anxiety. Patient not taking: Reported on 01/24/2016 10/02/15   Dominga Ferry,  Velna Hatchet, NP  ibuprofen (ADVIL,MOTRIN) 600 MG tablet Take 1 tablet (600 mg total) by mouth every 6 (six) hours as needed. 03/06/16   Kirichenko, Tatyana, PA-C  lidocaine (XYLOCAINE) 5 % ointment Apply 1 application topically as needed. Patient not taking: Reported on 03/06/2016 12/13/15   Derwood Kaplan, MD  naproxen (NAPROSYN) 375 MG tablet Take 1 tablet (375 mg total) by mouth 2 (two) times daily. Patient not taking: Reported on 03/06/2016 02/15/16   Arthor Captain, PA-C  naproxen (NAPROSYN) 500 MG tablet Take 1 tablet (500 mg total) by mouth 2 (two) times daily. Patient not taking:  Reported on 03/06/2016 12/14/15   Derwood Kaplan, MD  Oxcarbazepine (TRILEPTAL) 300 MG tablet Take 1 tablet (300 mg total) by mouth 2 (two) times daily. Patient not taking: Reported on 01/24/2016 10/02/15   Adonis Brook, NP    Family History Family History  Problem Relation Age of Onset  . Mental illness Neg Hx     Social History Social History  Substance Use Topics  . Smoking status: Never Smoker  . Smokeless tobacco: Not on file  . Alcohol use No     Allergies   Patient has no known allergies.   Review of Systems Review of Systems  Constitutional: Positive for fatigue. Negative for chills and fever.  HENT: Negative for ear pain and sore throat ("When I talk to much I get air trapped in my throat").   Eyes: Negative for pain and visual disturbance.  Respiratory: Negative for cough and shortness of breath.   Cardiovascular: Negative for chest pain and palpitations.  Gastrointestinal: Positive for diarrhea. Negative for abdominal distention, abdominal pain, nausea and vomiting.  Genitourinary: Negative for dysuria.  Musculoskeletal: Negative for arthralgias and back pain.  Skin: Negative for color change and rash.  Neurological: Negative for dizziness, seizures, syncope, light-headedness and headaches.  All other systems reviewed and are negative.   Physical Exam Updated Vital Signs BP 123/86 (BP Location: Right Arm)   Pulse (!) 55   Temp 98.2 F (36.8 C) (Oral)   Resp 16   SpO2 98%   Physical Exam  Constitutional: He is oriented to person, place, and time. Vital signs are normal. He appears well-developed and well-nourished. He does not have a sickly appearance.  HENT:  Head: Atraumatic.  Right Ear: Tympanic membrane, external ear and ear canal normal.  Left Ear: Tympanic membrane, external ear and ear canal normal.  Nose: Nose normal.  Mouth/Throat: Uvula is midline, oropharynx is clear and moist and mucous membranes are normal. Mucous membranes are not dry. No  uvula swelling. No oropharyngeal exudate.  Eyes: Pupils are equal, round, and reactive to light. Conjunctivae and EOM are normal. No scleral icterus. Right eye exhibits no nystagmus. Left eye exhibits no nystagmus.  No eye twitching observed to bilateral eyes.  Neck: Normal range of motion. Neck supple.  Cardiovascular: Normal rate and regular rhythm.   No murmur heard. Pulmonary/Chest: Effort normal and breath sounds normal. No stridor. No respiratory distress.  Abdominal: Soft. He exhibits no distension. There is no tenderness. There is no guarding.  Lymphadenopathy:    He has no cervical adenopathy.  Neurological: He is alert and oriented to person, place, and time. He has normal strength. No sensory deficit. He exhibits normal muscle tone. Coordination and gait normal. GCS eye subscore is 4. GCS verbal subscore is 5. GCS motor subscore is 6.  Skin: Skin is warm and dry. He is not diaphoretic.  Psychiatric: His mood appears not anxious. His  affect is blunt. His affect is not inappropriate. His speech is delayed. He is slowed and withdrawn. He is not actively hallucinating. He expresses no homicidal and no suicidal ideation.  Nursing note and vitals reviewed.    ED Treatments / Results  Labs (all labs ordered are listed, but only abnormal results are displayed) Labs Reviewed  COMPREHENSIVE METABOLIC PANEL - Abnormal; Notable for the following:       Result Value   Total Protein 8.6 (*)    ALT 15 (*)    All other components within normal limits  CBC WITH DIFFERENTIAL/PLATELET    EKG  EKG Interpretation None       Radiology No results found.  Procedures Procedures (including critical care time)  Medications Ordered in ED Medications - No data to display   Initial Impression / Assessment and Plan / ED Course  I have reviewed the triage vital signs and the nursing notes.  Pertinent labs & imaging results that were available during my care of the patient were reviewed by  me and considered in my medical decision making (see chart for details).    Kerrin MoMalcolm Reichenberger presents with multiple vague complaints, however he primarily reports that while he was walking around downtown this afternoon he felt tired. Baseline labs were obtained based on reported diarrhea. Electrolytes within normal limits, no elevated white counts.  Abdomen is soft, nontender, nondistended without focal findings.  Physical exam does not show any obvious weakness or neuro deficits. 5 out of 5 strength in all 4 extremities.  Based on patient's unclear history, patient was discussed with Dr. Criss AlvineGoldston who agreed with checking electrolytes and providing instructions on oral rehydration.    Patient denies SI or HI, is not interacting with internal stimuli, is not floridly psychotic. All laboratory findings were explained to the patient, who was given the option to ask questions, all of which were answered to the best of my abilities. Patient was given strict return precautions and stated his understanding and was able to repeat them back to me.     At this time there does not appear to be any evidence of an acute emergency medical condition and the patient appears stable for discharge with appropriate outpatient follow up.Diagnosis was discussed with patient who verbalizes understanding and is agreeable to discharge. Pt case discussed with Dr. Criss AlvineGoldston who agrees with my plan.    Final Clinical Impressions(s) / ED Diagnoses   Final diagnoses:  Fatigue, unspecified type    New Prescriptions Discharge Medication List as of 03/02/2017 11:57 PM       Cristina GongHammond, Graci Hulce W, PA-C 03/03/17 [id number]    Pricilla LovelessGoldston, Scott, MD 03/03/17 [id number]

## 2017-03-02 NOTE — ED Triage Notes (Addendum)
Pt was BIB [facility]. He was walking downtown and is reporting feeling tired and nauseous since 2 pm. Denies pain or vomiting. Also states that his R eye is twitching. A&Ox4. Ambulatory.

## 2017-03-02 NOTE — Discharge Instructions (Signed)
Please obtain a primary care provider and follow up with them regarding your symptoms.

## 2017-03-10 ENCOUNTER — Emergency Department (HOSPITAL_COMMUNITY)
Admission: EM | Admit: 2017-03-10 | Discharge: 2017-03-11 | Disposition: A | Discharge status: Home / Self Care | Payer: Medicaid Other | Attending: Emergency Medicine | Admitting: Emergency Medicine

## 2017-03-10 ENCOUNTER — Encounter (HOSPITAL_COMMUNITY): Payer: Self-pay

## 2017-03-10 DIAGNOSIS — K2900 Acute gastritis without bleeding: Secondary | ICD-10-CM

## 2017-03-10 DIAGNOSIS — R4183 Borderline intellectual functioning: Secondary | ICD-10-CM | POA: Insufficient documentation

## 2017-03-10 DIAGNOSIS — Z79899 Other long term (current) drug therapy: Secondary | ICD-10-CM | POA: Insufficient documentation

## 2017-03-10 NOTE — ED Triage Notes (Signed)
Pt complains of abdominal pain, diarrhea and feeling like he's starving himself for over one week

## 2017-03-11 LAB — URINALYSIS, ROUTINE W REFLEX MICROSCOPIC
Glucose, UA: NEGATIVE mg/dL
HGB URINE DIPSTICK: NEGATIVE
Ketones, ur: 5 mg/dL — AB
Leukocytes, UA: NEGATIVE
NITRITE: NEGATIVE
Protein, ur: NEGATIVE mg/dL
SPECIFIC GRAVITY, URINE: 1.035 — AB (ref 1.005–1.030)
pH: 5 (ref 5.0–8.0)

## 2017-03-11 LAB — CBC
HCT: 40.1 % (ref 39.0–52.0)
Hemoglobin: 14 g/dL (ref 13.0–17.0)
MCH: 28.9 pg (ref 26.0–34.0)
MCHC: 34.9 g/dL (ref 30.0–36.0)
MCV: 82.7 fL (ref 78.0–100.0)
Platelets: 178 10*3/uL (ref 150–400)
RBC: 4.85 MIL/uL (ref 4.22–5.81)
RDW: 13.1 % (ref 11.5–15.5)
WBC: 6.4 10*3/uL (ref 4.0–10.5)

## 2017-03-11 LAB — COMPREHENSIVE METABOLIC PANEL
ALT: 12 U/L — AB (ref 17–63)
AST: 22 U/L (ref 15–41)
Albumin: 4.2 g/dL (ref 3.5–5.0)
Alkaline Phosphatase: 108 U/L (ref 38–126)
Anion gap: 6 (ref 5–15)
BUN: 19 mg/dL (ref 6–20)
CHLORIDE: 109 mmol/L (ref 101–111)
CO2: 26 mmol/L (ref 22–32)
CREATININE: 1.24 mg/dL (ref 0.61–1.24)
Calcium: 9.3 mg/dL (ref 8.9–10.3)
GFR calc non Af Amer: >60 mL/min (ref >60)
Glucose, Bld: 95 mg/dL (ref 65–99)
Potassium: 4.1 mmol/L (ref 3.5–5.1)
SODIUM: 141 mmol/L (ref 135–145)
Total Bilirubin: 0.6 mg/dL (ref 0.3–1.2)
Total Protein: 7.7 g/dL (ref 6.5–8.1)

## 2017-03-11 LAB — LIPASE, BLOOD: Lipase: 41 U/L (ref 11–51)

## 2017-03-11 MED ORDER — SODIUM CHLORIDE 0.9 % IV BOLUS (SEPSIS)
1000.0000 mL | Freq: Once | INTRAVENOUS | Status: AC
  Administered 2017-03-11: 1000 mL via INTRAVENOUS

## 2017-03-11 MED ORDER — KETOROLAC TROMETHAMINE 30 MG/ML IJ SOLN
30.0000 mg | Freq: Once | INTRAMUSCULAR | Status: AC
  Administered 2017-03-11: 30 mg via INTRAVENOUS
  Filled 2017-03-11: qty 1

## 2017-03-11 MED ORDER — ONDANSETRON HCL 4 MG PO TABS: 4.0000 mg | ORAL_TABLET | Freq: Four times a day (QID) | ORAL | 0 refills | Status: DC

## 2017-03-11 MED ORDER — GI COCKTAIL ~~LOC~~
30.0000 mL | Freq: Once | ORAL | Status: AC
  Administered 2017-03-11: 30 mL via ORAL
  Filled 2017-03-11: qty 30

## 2017-03-11 MED ORDER — DICYCLOMINE HCL 20 MG PO TABS
20.0000 mg | ORAL_TABLET | Freq: Two times a day (BID) | ORAL | 0 refills | Status: DC
Start: 2017-03-11 — End: 2017-04-27

## 2017-03-11 NOTE — ED Notes (Signed)
Bed: WA19 Expected date:  Expected time:  Means of arrival:  Comments: 1-8 

## 2017-03-11 NOTE — Discharge Instructions (Signed)
Please read and follow all provided instructions.  Your diagnoses today include:  1. Acute gastritis without hemorrhage, unspecified gastritis type     Tests performed today include: Blood counts and electrolytes Blood tests to check liver and kidney function Blood tests to check pancreas function Urine test to look for infection and pregnancy (in women) Vital signs. See below for your results today.   Medications prescribed:   Take any prescribed medications only as directed.  Home care instructions:  Follow any educational materials contained in this packet.  Follow-up instructions: Please follow-up with your primary care provider in the next 2 days for further evaluation of your symptoms.    Return instructions:  SEEK IMMEDIATE MEDICAL ATTENTION IF: The pain does not go away or becomes severe  A temperature above 101F develops  Repeated vomiting occurs (multiple episodes)  The pain becomes localized to portions of the abdomen. The right side could possibly be appendicitis. In an adult, the left lower portion of the abdomen could be colitis or diverticulitis.  Blood is being passed in stools or vomit (bright red or black tarry stools)  You develop chest pain, difficulty breathing, dizziness or fainting, or become confused, poorly responsive, or inconsolable (young children) If you have any other emergent concerns regarding your health  Additional Information: Abdominal (belly) pain can be caused by many things. Your caregiver performed an examination and possibly ordered blood/urine tests and imaging (CT scan, x-rays, ultrasound). Many cases can be observed and treated at home after initial evaluation in the emergency department. Even though you are being discharged home, abdominal pain can be unpredictable. Therefore, you need a repeated exam if your pain does not resolve, returns, or worsens. Most patients with abdominal pain don't have to be admitted to the hospital or have  surgery, but serious problems like appendicitis and gallbladder attacks can start out as nonspecific pain. Many abdominal conditions cannot be diagnosed in one visit, so follow-up evaluations are very important.  Your vital signs today were: BP 130/82 (BP Location: Left Arm)    Pulse 63    Temp 97.8 F (36.6 C) (Oral)    Resp 18    SpO2 100%  If your blood pressure (bp) was elevated above 135/85 this visit, please have this repeated by your doctor within one month. --------------

## 2017-03-11 NOTE — ED Provider Notes (Signed)
[facility]-EMERGENCY DEPT Provider Note   CSN: [id number] Arrival date & time: 03/10/17  2005     History   Chief Complaint Chief Complaint  Patient presents with  . Abdominal Pain    HPI Kahne Helfand is a 21 y.o. male.  HPI  21 y.o. male, presents to the Emergency Department today due to abdominal pain x 1 week. Notes intermittent pain at first in LUQ. Notes pain worsened this morning and feels like cramping sensation. Rates pain 8/10. Able to tolerate PO. Notes nausea without emesis. Notes diarrhea x 1 today. No CP/SOB. Notes fatigue. No sick contacts. No fevers. No headaches.Noted hx same x 1 week ago and seen in ED with unremarkable lab work. Denies dysuria.  No other symptoms noted.   Past Medical History:  Diagnosis Date  . Seizures [facility])     Patient Active Problem List   Diagnosis Date Noted  . 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 Psychoses   . Schizophrenia (HCC) 09/29/2015  . Cannabis use disorder, moderate, in early remission (HCC) 09/26/2015  . History of posttraumatic stress disorder (PTSD) 09/26/2015    Past Surgical History:  Procedure Laterality Date  . abd surgery s/p traumatic event    . facial reconstructive surgery    . NO PAST SURGERIES  patient stated he had facial reconstruction surgery as a child   . tumor removed from head          Home Medications    Prior to Admission medications   Medication Sig Start Date End Date Taking? Authorizing Provider  amoxicillin (AMOXIL) 500 MG capsule Take 1 capsule (500 mg total) by mouth 3 (three) times daily. Patient not taking: Reported on 03/11/2017 03/06/16   Jaynie Crumble, PA-C  benztropine (COGENTIN) 1 MG tablet Take 1 tablet (1 mg total) by mouth 2 (two) times daily. Patient not taking: Reported on 01/24/2016 10/02/15   Adonis Brook, NP  cyclobenzaprine (FLEXERIL) 5 MG tablet Take 1 tablet (5 mg total) by mouth 2 (two) times daily as needed for muscle spasms. Patient not taking:  Reported on 03/06/2016 02/15/16   Arthor Captain, PA-C  docusate sodium (COLACE) 100 MG capsule Take 1 capsule (100 mg total) by mouth every 12 (twelve) hours. Patient not taking: Reported on 03/06/2016 12/13/15   Derwood Kaplan, MD  haloperidol (HALDOL) 5 MG tablet Take on tablet (5 mg) in the morning.  Take 2 tabs (10 mg) at bedtime. Patient not taking: Reported on 01/24/2016 10/02/15   Adonis Brook, NP  hydrOXYzine (ATARAX/VISTARIL) 25 MG tablet Take 1 tablet (25 mg total) by mouth 3 (three) times daily as needed for anxiety. Patient not taking: Reported on 01/24/2016 10/02/15   Adonis Brook, NP  ibuprofen (ADVIL,MOTRIN) 600 MG tablet Take 1 tablet (600 mg total) by mouth every 6 (six) hours as needed. Patient not taking: Reported on 03/11/2017 03/06/16   Jaynie Crumble, PA-C  lidocaine (XYLOCAINE) 5 % ointment Apply 1 application topically as needed. Patient not taking: Reported on 03/06/2016 12/13/15   Derwood Kaplan, MD  naproxen (NAPROSYN) 375 MG tablet Take 1 tablet (375 mg total) by mouth 2 (two) times daily. Patient not taking: Reported on 03/06/2016 02/15/16   Arthor Captain, PA-C  Oxcarbazepine (TRILEPTAL) 300 MG tablet Take 1 tablet (300 mg total) by mouth 2 (two) times daily. Patient not taking: Reported on 01/24/2016 10/02/15   Adonis Brook, NP    Family History Family History  Problem Relation Age of Onset  . Mental illness Neg Hx  Social History Social History  Substance Use Topics  . Smoking status: Never Smoker  . Smokeless tobacco: Never Used  . Alcohol use No     Allergies   Patient has no known allergies.   Review of Systems Review of Systems ROS reviewed and all are negative for acute change except as noted in the HPI.  Physical Exam Updated Vital Signs BP 137/78 (BP Location: Left Arm)   Pulse 67   Temp 97.8 F (36.6 C) (Oral)   Resp 18   SpO2 100%   Physical Exam  Constitutional: He is oriented to person, place, and time. Vital signs are  normal. He appears well-developed and well-nourished.  NAD  HENT:  Head: Normocephalic and atraumatic.  Right Ear: Hearing normal.  Left Ear: Hearing normal.  Eyes: Pupils are equal, round, and reactive to light. Conjunctivae and EOM are normal.  Neck: Normal range of motion. Neck supple.  Cardiovascular: Normal rate, regular rhythm, normal heart sounds and intact distal pulses.   Pulmonary/Chest: Effort normal and breath sounds normal.  Abdominal: Soft. Normal appearance and bowel sounds are normal. There is tenderness in the left upper quadrant. There is no rigidity, no rebound, no guarding, no CVA tenderness, no tenderness at McBurney's point and negative Murphy's sign.  Musculoskeletal: Normal range of motion.  Neurological: He is alert and oriented to person, place, and time.  Skin: Skin is warm and dry.  Psychiatric: He has a normal mood and affect. His speech is normal and behavior is normal. Thought content normal.  Nursing note and vitals reviewed.  ED Treatments / Results  Labs (all labs ordered are listed, but only abnormal results are displayed) Labs Reviewed  COMPREHENSIVE METABOLIC PANEL - Abnormal; Notable for the following:       Result Value   ALT 12 (*)    All other components within normal limits  URINALYSIS, ROUTINE W REFLEX MICROSCOPIC - Abnormal; Notable for the following:    Specific Gravity, Urine 1.035 (*)    Bilirubin Urine SMALL (*)    Ketones, ur 5 (*)    All other components within normal limits  CBC  LIPASE, BLOOD    EKG  EKG Interpretation None       Radiology No results found.  Procedures Procedures (including critical care time)  Medications Ordered in ED Medications - No data to display   Initial Impression / Assessment and Plan / ED Course  I have reviewed the triage vital signs and the nursing notes.  Pertinent labs & imaging results that were available during my care of the patient were reviewed by me and considered in my  medical decision making (see chart for details).  Final Clinical Impressions(s) / ED Diagnoses  {I have reviewed and evaluated the relevant laboratory values.   {I have reviewed the relevant previous healthcare records.  {I obtained HPI from historian.   ED Course:  Assessment: Patient is a 21 y.o. male presents to the Emergency Department today due to abdominal pain x 1 week. Notes intermittent pain at first in LUQ. Notes pain worsened this morning and feels like cramping sensation. Rates pain 8/10. Able to tolerate PO. Notes nausea without emesis. Notes diarrhea x 1 today. No CP/SOB. Notes fatigue. No sick contacts. No fevers. No headaches.Noted hx same x 1 week ago and seen in ED with unremarkable lab work. Denies dysuria. On exam, nontoxic, nonseptic appearing, in no apparent distress. Patient's pain and other symptoms adequately managed in emergency department.  Fluid bolus given.  Labs and vitals reviewed.  Given GI cocktail with improvement. Patient does not meet the SIRS or Sepsis criteria.  On repeat exam patient does not have a surgical abdomen and there are no peritoneal signs.  No indication of appendicitis, bowel obstruction, bowel perforation, cholecystitis, diverticulitis. Patient discharged home with symptomatic treatment and given strict instructions for follow-up with their primary care physician.  I have also discussed reasons to return immediately to the ER.  Patient expresses understanding and agrees with plan  Disposition/Plan:  DC Home Additional Verbal discharge instructions given and discussed with patient.  Pt Instructed to f/u with PCP in the next week for evaluation and treatment of symptoms. Return precautions given Pt acknowledges and agrees with plan  Supervising Physician Palumbo, April, MD  Final diagnoses:  Acute gastritis without hemorrhage, unspecified gastritis type    New Prescriptions New Prescriptions   No medications on file     Audry PiliMohr, Wheeler Incorvaia,  Cordelia Poche-C 03/11/17 0415    Palumbo, April, MD 03/11/17 [phone]

## 2017-03-11 NOTE — ED Notes (Signed)
Gave urinal to patient to encourage urine sample.

## 2017-03-19 ENCOUNTER — Emergency Department (HOSPITAL_COMMUNITY)
Admission: EM | Admit: 2017-03-19 | Discharge: 2017-03-19 | Disposition: A | Discharge status: Home / Self Care | Payer: Medicaid Other | Attending: Physician Assistant | Admitting: Physician Assistant

## 2017-03-19 ENCOUNTER — Encounter (HOSPITAL_COMMUNITY): Payer: Self-pay | Admitting: Emergency Medicine

## 2017-03-19 DIAGNOSIS — M79601 Pain in right arm: Secondary | ICD-10-CM

## 2017-03-19 DIAGNOSIS — M79605 Pain in left leg: Secondary | ICD-10-CM | POA: Insufficient documentation

## 2017-03-19 DIAGNOSIS — F17210 Nicotine dependence, cigarettes, uncomplicated: Secondary | ICD-10-CM | POA: Insufficient documentation

## 2017-03-19 DIAGNOSIS — M79604 Pain in right leg: Secondary | ICD-10-CM

## 2017-03-19 LAB — I-STAT CHEM 8, ED
BUN: 20 mg/dL (ref 6–20)
CALCIUM ION: 1.19 mmol/L (ref 1.15–1.40)
CREATININE: 1.1 mg/dL (ref 0.61–1.24)
Chloride: 104 mmol/L (ref 101–111)
Glucose, Bld: 111 mg/dL — ABNORMAL HIGH (ref 65–99)
HEMATOCRIT: 43 % (ref 39.0–52.0)
HEMOGLOBIN: 14.6 g/dL (ref 13.0–17.0)
Potassium: 3.9 mmol/L (ref 3.5–5.1)
SODIUM: 144 mmol/L (ref 135–145)
TCO2: 27 mmol/L (ref 0–100)

## 2017-03-19 MED ORDER — ACETAMINOPHEN 325 MG PO TABS
650.0000 mg | ORAL_TABLET | Freq: Once | ORAL | Status: AC
  Administered 2017-03-19: 650 mg via ORAL
  Filled 2017-03-19: qty 2

## 2017-03-19 MED ORDER — ACETAMINOPHEN 500 MG PO TABS: 500.0000 mg | ORAL_TABLET | Freq: Four times a day (QID) | ORAL | 0 refills | Status: DC | PRN

## 2017-03-19 NOTE — ED Provider Notes (Signed)
[facility]-EMERGENCY DEPT Provider Note   CSN: [id number] Arrival date & time: 03/19/17  1709  By signing my name below, I, Deland Pretty, attest that this documentation has been prepared under the direction and in the presence of Everlene Farrier, PA-C. Electronically Signed: Deland Pretty, ED Scribe. 03/19/17. 5:59 PM.  History   Chief Complaint Chief Complaint  Patient presents with  . Arm Pain  . Leg Pain  . Rectal Pain   The history is provided by the patient. No language interpreter was used.   HPI Comments: Collin White is a 21 y.o. male with intellectual disability who presents to the Emergency Department complaining of "sharp, needle pain" right arm, and right leg pain that began today. He also complains of "throbbing" right buttock pain that also began today. Prior to the onset of his symptoms the pt was walking. The pt reports that pain is exacerbated with movement. He denies recent fall and injury. His last BM occurred yesterday and was normal.  He denies injury, falls, trouble walking, rashes, chest pain, difficulty urinating, dysuria, abdominal pain, nausea, vomiting, and fever.  Past Medical History:  Diagnosis Date  . Seizures [name])     Patient Active Problem List   Diagnosis Date Noted  . 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 Psychoses   . Schizophrenia (HCC) 09/29/2015  . Cannabis use disorder, moderate, in early remission (HCC) 09/26/2015  . History of posttraumatic stress disorder (PTSD) 09/26/2015    Past Surgical History:  Procedure Laterality Date  . abd surgery s/p traumatic event    . facial reconstructive surgery    . NO PAST SURGERIES  patient stated he had facial reconstruction surgery as a child   . tumor removed from head          Home Medications    Prior to Admission medications   Medication Sig Start Date End Date Taking? Authorizing Provider  acetaminophen (TYLENOL) 500 MG tablet Take 1 tablet (500 mg total) by mouth every  6 (six) hours as needed. 03/19/17   Everlene Farrier, PA-C  amoxicillin (AMOXIL) 500 MG capsule Take 1 capsule (500 mg total) by mouth 3 (three) times daily. Patient not taking: Reported on 03/11/2017 03/06/16   Jaynie Crumble, PA-C  benztropine (COGENTIN) 1 MG tablet Take 1 tablet (1 mg total) by mouth 2 (two) times daily. Patient not taking: Reported on 01/24/2016 10/02/15   Adonis Brook, NP  cyclobenzaprine (FLEXERIL) 5 MG tablet Take 1 tablet (5 mg total) by mouth 2 (two) times daily as needed for muscle spasms. Patient not taking: Reported on 03/06/2016 02/15/16   Arthor Captain, PA-C  dicyclomine (BENTYL) 20 MG tablet Take 1 tablet (20 mg total) by mouth 2 (two) times daily. 03/11/17   Audry Pili, PA-C  docusate sodium (COLACE) 100 MG capsule Take 1 capsule (100 mg total) by mouth every 12 (twelve) hours. Patient not taking: Reported on 03/06/2016 12/13/15   Derwood Kaplan, MD  haloperidol (HALDOL) 5 MG tablet Take on tablet (5 mg) in the morning.  Take 2 tabs (10 mg) at bedtime. Patient not taking: Reported on 01/24/2016 10/02/15   Adonis Brook, NP  hydrOXYzine (ATARAX/VISTARIL) 25 MG tablet Take 1 tablet (25 mg total) by mouth 3 (three) times daily as needed for anxiety. Patient not taking: Reported on 01/24/2016 10/02/15   Adonis Brook, NP  lidocaine (XYLOCAINE) 5 % ointment Apply 1 application topically as needed. Patient not taking: Reported on 03/06/2016 12/13/15   Derwood Kaplan, MD  ondansetron (ZOFRAN) 4 MG tablet  Take 1 tablet (4 mg total) by mouth every 6 (six) hours. 03/11/17   Audry PiliMohr, Tyler, PA-C  Oxcarbazepine (TRILEPTAL) 300 MG tablet Take 1 tablet (300 mg total) by mouth 2 (two) times daily. Patient not taking: Reported on 01/24/2016 10/02/15   Adonis BrookAgustin, Sheila, NP    Family History Family History  Problem Relation Age of Onset  . Mental illness Neg Hx     Social History Social History  Substance Use Topics  . Smoking status: Current Every Day Smoker    Types: Cigarettes    . Smokeless tobacco: Never Used  . Alcohol use No     Allergies   Patient has no known allergies.   Review of Systems Review of Systems  Constitutional: Negative for chills and fever.  HENT: Negative for congestion and sore throat.   Eyes: Negative for visual disturbance.  Respiratory: Negative for cough and shortness of breath.   Cardiovascular: Negative for chest pain.  Gastrointestinal: Negative for abdominal pain, blood in stool, constipation, diarrhea, nausea, rectal pain and vomiting.  Genitourinary: Negative for difficulty urinating, dysuria, frequency, hematuria and urgency.  Musculoskeletal: Positive for arthralgias and myalgias. Negative for back pain and neck pain.  Skin: Negative for rash.  Neurological: Negative for dizziness, syncope, weakness, light-headedness, numbness and headaches.     Physical Exam Updated Vital Signs BP 123/86 (BP Location: Left Arm)   Pulse 74   Temp 98.2 F (36.8 C) (Oral)   Resp 18   Ht 6' (1.829 m)   Wt 64.9 kg (143 lb)   SpO2 99%   BMI 19.39 kg/m   Physical Exam  Constitutional: He is oriented to person, place, and time. He appears well-developed and well-nourished. No distress.  Nontoxic appearing.  HENT:  Head: Normocephalic and atraumatic.  Right Ear: External ear normal.  Left Ear: External ear normal.  Mouth/Throat: Oropharynx is clear and moist.  Eyes: Pupils are equal, round, and reactive to light. Conjunctivae are normal. Right eye exhibits no discharge. Left eye exhibits no discharge.  Neck: Normal range of motion. Neck supple.  Cardiovascular: Normal rate, regular rhythm, normal heart sounds and intact distal pulses.   Pulmonary/Chest: Effort normal and breath sounds normal. No respiratory distress. He has no wheezes. He has no rales.  Abdominal: Soft. Bowel sounds are normal. He exhibits no mass. There is no tenderness. There is no guarding.  Genitourinary:  Genitourinary Comments: GU exam is unremarkable.  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  Musculoskeletal: Normal range of motion. He exhibits no edema, tenderness or deformity.  No extremity edema or tenderness to palpation. He has good range of motion of his extremities without difficulty. Note tenderness noted. No overlying skin changes. No rashes. No joint edema or warmth. Ambulates with normal gait.  Lymphadenopathy:    He has no cervical adenopathy.  Neurological: He is alert and oriented to person, place, and time. No sensory deficit. He exhibits normal muscle tone. Coordination normal.  Normal gait.  Skin: Skin is warm and dry. Capillary refill takes less than 2 seconds. No rash noted. He is not diaphoretic. No erythema. No pallor.  Psychiatric: He has a normal mood and affect. His behavior is normal.  Nursing note and vitals reviewed.    ED Treatments / Results   DIAGNOSTIC STUDIES: Oxygen Saturation is 99% on RA, normal by my interpretation.   COORDINATION OF CARE: 5:32 PM-Discussed next steps with pt. Pt verbalized understanding and is agreeable with the plan.   Labs (all labs ordered are listed, but  only abnormal results are displayed) Labs Reviewed  I-STAT CHEM 8, ED - Abnormal; Notable for the following:       Result Value   Glucose, Bld 111 (*)    All other components within normal limits    EKG  EKG Interpretation None       Radiology No results found.  Procedures Procedures (including critical care time)  Medications Ordered in ED Medications  acetaminophen (TYLENOL) tablet 650 mg (650 mg Oral Given 03/19/17 1745)     Initial Impression / Assessment and Plan / ED Course  I have reviewed the triage vital signs and the nursing notes.  Pertinent labs & imaging results that were available during my care of the patient were reviewed by me and considered in my medical decision making (see chart for details).     This is a 21 y.o. male with intellectual disability who presents to the Emergency Department  complaining of "sharp, needle pain" right arm, and right leg pain that began today. He also complains of "throbbing" right buttock pain that also began today. Prior to the onset of his symptoms the pt was walking. The pt reports that pain is exacerbated with movement. He denies recent fall and injury. His last BM occurred yesterday and was normal.  He denies injury, falls, trouble walking, rashes, chest pain, difficulty urinating, dysuria, abdominal pain, nausea, vomiting, and fever. On exam the patient is afebrile nontoxic appearing. His mucous membranes are moist. His abdomen is soft and nontender to palpation. No tenderness noted to his extremities. No rashes or overlying skin changes. He ambulance with normal gait. I-STAT Chem-8 was obtained which showed unremarkable labs. Normal kidney function. Patient denies any cramping like pain.  We'll discharge with close follow-up by primary care. Return precautions discussed. I advised the patient to follow-up with their primary care provider this week. I advised the patient to return to the emergency department with new or worsening symptoms or new concerns. The patient verbalized understanding and agreement with plan.     Final Clinical Impressions(s) / ED Diagnoses   Final diagnoses:  Pain of right upper extremity  Pain of right lower extremity    New Prescriptions New Prescriptions   ACETAMINOPHEN (TYLENOL) 500 MG TABLET    Take 1 tablet (500 mg total) by mouth every 6 (six) hours as needed.   I personally performed the services described in this documentation, which was scribed in my presence. The recorded information has been reviewed and is accurate.       Everlene FarrierDansie, Brain Honeycutt, PA-C 03/19/17 1801    [place]ackuen, Cindee Saltourteney Lyn, MD 03/20/17 0000

## 2017-03-19 NOTE — ED Triage Notes (Addendum)
Patient c/o right buttock, right arm and leg pain that started today while walking in the rain. Patient reports that pain is worse with movement. Patient denies any fall or injury to cause the pain. Patient has full ROM of all extremities.

## 2017-04-02 ENCOUNTER — Encounter (HOSPITAL_COMMUNITY): Payer: Self-pay

## 2017-04-02 DIAGNOSIS — R05 Cough: Secondary | ICD-10-CM | POA: Insufficient documentation

## 2017-04-02 DIAGNOSIS — F17210 Nicotine dependence, cigarettes, uncomplicated: Secondary | ICD-10-CM | POA: Insufficient documentation

## 2017-04-02 DIAGNOSIS — B9789 Other viral agents as the cause of diseases classified elsewhere: Secondary | ICD-10-CM | POA: Insufficient documentation

## 2017-04-02 DIAGNOSIS — Z79899 Other long term (current) drug therapy: Secondary | ICD-10-CM | POA: Insufficient documentation

## 2017-04-02 DIAGNOSIS — J029 Acute pharyngitis, unspecified: Secondary | ICD-10-CM | POA: Insufficient documentation

## 2017-04-02 LAB — RAPID STREP SCREEN (MED CTR MEBANE ONLY): Streptococcus, Group A Screen (Direct): NEGATIVE

## 2017-04-02 NOTE — ED Triage Notes (Signed)
Pt c/o 10/10 sore throat pain x2 weeks. Pt denies cough or nasal drainage. Pt in no acute respiratory distress speaking in complete sentences.

## 2017-04-03 ENCOUNTER — Emergency Department (HOSPITAL_COMMUNITY)
Admission: EM | Admit: 2017-04-03 | Discharge: 2017-04-03 | Disposition: A | Discharge status: Home / Self Care | Payer: Medicaid Other | Attending: Emergency Medicine | Admitting: Emergency Medicine

## 2017-04-03 DIAGNOSIS — J029 Acute pharyngitis, unspecified: Secondary | ICD-10-CM

## 2017-04-03 NOTE — Discharge Instructions (Signed)
Take tylenol 2 pills 4 times a day and motrin 4 pills 3 times a day.  Drink plenty of fluids.  Return for worsening shortness of breath, headache, confusion. Follow up with your family doctor.   
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
## 2017-04-05 LAB — CULTURE, GROUP A STREP (THRC)

## 2017-04-11 ENCOUNTER — Emergency Department (HOSPITAL_COMMUNITY)
Admission: EM | Admit: 2017-04-11 | Discharge: 2017-04-11 | Discharge status: Against Medical Advice | Payer: Medicaid Other

## 2017-04-11 NOTE — ED Notes (Signed)
Called for triage x2

## 2017-04-12 ENCOUNTER — Encounter (HOSPITAL_COMMUNITY): Payer: Self-pay | Admitting: Emergency Medicine

## 2017-04-12 ENCOUNTER — Emergency Department (HOSPITAL_COMMUNITY)
Admission: EM | Admit: 2017-04-12 | Discharge: 2017-04-12 | Disposition: A | Discharge status: Home / Self Care | Payer: Medicaid Other | Attending: Emergency Medicine | Admitting: Emergency Medicine

## 2017-04-12 DIAGNOSIS — M79672 Pain in left foot: Secondary | ICD-10-CM | POA: Insufficient documentation

## 2017-04-12 DIAGNOSIS — F17210 Nicotine dependence, cigarettes, uncomplicated: Secondary | ICD-10-CM | POA: Insufficient documentation

## 2017-04-12 DIAGNOSIS — M7742 Metatarsalgia, left foot: Secondary | ICD-10-CM | POA: Insufficient documentation

## 2017-04-12 DIAGNOSIS — G8929 Other chronic pain: Secondary | ICD-10-CM | POA: Insufficient documentation

## 2017-04-12 DIAGNOSIS — Z79899 Other long term (current) drug therapy: Secondary | ICD-10-CM | POA: Insufficient documentation

## 2017-04-12 MED ORDER — ACETAMINOPHEN 325 MG PO TABS
650.0000 mg | ORAL_TABLET | Freq: Once | ORAL | Status: AC
  Administered 2017-04-12: 650 mg via ORAL
  Filled 2017-04-12: qty 2

## 2017-04-12 NOTE — ED Notes (Signed)
Bed: WTR7 Expected date:  Expected time:  Means of arrival:  Comments: 
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
## 2017-04-12 NOTE — ED Triage Notes (Addendum)
Patient c/o neck pain that started while walking x4 days ago. Reports pain worsens with movement. Ambulatory to triage. Seen for same today.

## 2017-04-12 NOTE — Discharge Instructions (Signed)
Try to get more supportive shoes that aren't worn down. Soak your feet in a hot tub about 2-3 times per day. Alternate between tylenol and motrin pain relief. Follow up with the [facility] in 1 week for recheck of symptoms and to establish medical care. Return to the ER for changes or worsening symptoms.
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
## 2017-04-13 ENCOUNTER — Emergency Department (HOSPITAL_COMMUNITY)
Admission: EM | Admit: 2017-04-13 | Discharge: 2017-04-13 | Discharge status: Against Medical Advice | Payer: Medicaid Other

## 2017-04-14 ENCOUNTER — Encounter (HOSPITAL_COMMUNITY): Payer: Self-pay | Admitting: Emergency Medicine

## 2017-04-14 DIAGNOSIS — J029 Acute pharyngitis, unspecified: Secondary | ICD-10-CM | POA: Insufficient documentation

## 2017-04-14 DIAGNOSIS — R112 Nausea with vomiting, unspecified: Secondary | ICD-10-CM | POA: Insufficient documentation

## 2017-04-14 DIAGNOSIS — F17210 Nicotine dependence, cigarettes, uncomplicated: Secondary | ICD-10-CM | POA: Insufficient documentation

## 2017-04-14 DIAGNOSIS — R51 Headache: Secondary | ICD-10-CM | POA: Insufficient documentation

## 2017-04-14 DIAGNOSIS — R300 Dysuria: Secondary | ICD-10-CM | POA: Insufficient documentation

## 2017-04-14 DIAGNOSIS — R440 Auditory hallucinations: Secondary | ICD-10-CM | POA: Insufficient documentation
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
## 2017-04-15 ENCOUNTER — Emergency Department (HOSPITAL_COMMUNITY)
Admission: EM | Admit: 2017-04-15 | Discharge: 2017-04-15 | Disposition: A | Discharge status: Home / Self Care | Payer: Medicaid Other | Attending: Emergency Medicine | Admitting: Emergency Medicine

## 2017-04-15 DIAGNOSIS — R519 Headache, unspecified: Secondary | ICD-10-CM

## 2017-04-15 DIAGNOSIS — R51 Headache: Secondary | ICD-10-CM

## 2017-04-15 DIAGNOSIS — R300 Dysuria: Secondary | ICD-10-CM

## 2017-04-15 DIAGNOSIS — J029 Acute pharyngitis, unspecified: Secondary | ICD-10-CM

## 2017-04-15 LAB — URINALYSIS, ROUTINE W REFLEX MICROSCOPIC
Bilirubin Urine: NEGATIVE
GLUCOSE, UA: NEGATIVE mg/dL
HGB URINE DIPSTICK: NEGATIVE
KETONES UR: NEGATIVE mg/dL
Leukocytes, UA: NEGATIVE
NITRITE: NEGATIVE
PH: 5 (ref 5.0–8.0)
Protein, ur: NEGATIVE mg/dL
SPECIFIC GRAVITY, URINE: 1.03 (ref 1.005–1.030)

## 2017-04-15 MED ORDER — ONDANSETRON 4 MG PO TBDP
4.0000 mg | ORAL_TABLET | Freq: Once | ORAL | Status: AC
  Administered 2017-04-15: 4 mg via ORAL
  Filled 2017-04-15: qty 1

## 2017-04-15 MED ORDER — ACETAMINOPHEN 500 MG PO TABS
1000.0000 mg | ORAL_TABLET | Freq: Once | ORAL | Status: AC
  Administered 2017-04-15: 1000 mg via ORAL
  Filled 2017-04-15: qty 2

## 2017-04-15 NOTE — ED Provider Notes (Signed)
TIME SEEN: 4:17 AM  CHIEF COMPLAINT: Multiple complaints  HPI: Patient is a 21 year old male with history of seizures, possible developmental delay, homelessness who presents to the emergency department with multiple complaints. He states he feels like his head is "swollen" since eating a smoker's bar today. He also states he has felt nauseated since he somewhat also vomited. He has had sore throat which she has complained about during several emergency department visits and has had recent negative strep testing. Also reported a cough with triage but does not report this to me. He complains of lower abdominal pain which is where he states his "kidneys are". He complains of dysuria and penile pain but no penile discharge, testicular pain or swelling. He states currently he is not homeless. He reports he has been drinking alcohol. No drug use. No SI, HI. He reports having auditory hallucinations and hearing someone "knocking". He denies any command hallucinations.  ROS: See HPI Constitutional: no fever  Eyes: no drainage  ENT: no runny nose   Cardiovascular:  no chest pain  Resp: no SOB  GI: no vomiting GU: no dysuria Integumentary: no rash  Allergy: no hives  Musculoskeletal: no leg swelling  Neurological: no slurred speech ROS otherwise negative  PAST MEDICAL HISTORY/PAST SURGICAL HISTORY:  Past Medical History:  Diagnosis Date  . Seizures (HCC)     MEDICATIONS:  Prior to Admission medications   Medication Sig Start Date End Date Taking? Authorizing Provider  acetaminophen (TYLENOL) 500 MG tablet Take 1 tablet (500 mg total) by mouth every 6 (six) hours as needed. 03/19/17   Everlene Farrier, PA-C  amoxicillin (AMOXIL) 500 MG capsule Take 1 capsule (500 mg total) by mouth 3 (three) times daily. Patient not taking: Reported on 03/11/2017 03/06/16   Jaynie Crumble, PA-C  benztropine (COGENTIN) 1 MG tablet Take 1 tablet (1 mg total) by mouth 2 (two) times daily. Patient not taking:  Reported on 01/24/2016 10/02/15   Adonis Brook, NP  cyclobenzaprine (FLEXERIL) 5 MG tablet Take 1 tablet (5 mg total) by mouth 2 (two) times daily as needed for muscle spasms. Patient not taking: Reported on 03/06/2016 02/15/16   Arthor Captain, PA-C  dicyclomine (BENTYL) 20 MG tablet Take 1 tablet (20 mg total) by mouth 2 (two) times daily. 03/11/17   Audry Pili, PA-C  docusate sodium (COLACE) 100 MG capsule Take 1 capsule (100 mg total) by mouth every 12 (twelve) hours. Patient not taking: Reported on 03/06/2016 12/13/15   Derwood Kaplan, MD  haloperidol (HALDOL) 5 MG tablet Take on tablet (5 mg) in the morning.  Take 2 tabs (10 mg) at bedtime. Patient not taking: Reported on 01/24/2016 10/02/15   Adonis Brook, NP  hydrOXYzine (ATARAX/VISTARIL) 25 MG tablet Take 1 tablet (25 mg total) by mouth 3 (three) times daily as needed for anxiety. Patient not taking: Reported on 01/24/2016 10/02/15   Adonis Brook, NP  lidocaine (XYLOCAINE) 5 % ointment Apply 1 application topically as needed. Patient not taking: Reported on 03/06/2016 12/13/15   Derwood Kaplan, MD  ondansetron (ZOFRAN) 4 MG tablet Take 1 tablet (4 mg total) by mouth every 6 (six) hours. 03/11/17   Audry Pili, PA-C  Oxcarbazepine (TRILEPTAL) 300 MG tablet Take 1 tablet (300 mg total) by mouth 2 (two) times daily. Patient not taking: Reported on 01/24/2016 10/02/15   Adonis Brook, NP    ALLERGIES:  No Known Allergies  SOCIAL HISTORY:  Social History  Substance Use Topics  . Smoking status: Current Every Day Smoker  Types: Cigarettes  . Smokeless tobacco: Never Used  . Alcohol use Yes     Comment: social    FAMILY HISTORY: Family History  Problem Relation Age of Onset  . Hypertension Other   . Mental illness Neg Hx     EXAM: BP 114/80 (BP Location: Left Arm)   Pulse 72   Temp 97.9 F (36.6 C) (Oral)   Resp 14   SpO2 100%  CONSTITUTIONAL: Alert and oriented and responds appropriately to questions. Chronically  ill-appearing, afebrile, nontoxic HEAD: Normocephalic EYES: Conjunctivae clear, pupils appear equal, EOMI ENT: normal nose; moist mucous membranes; No pharyngeal erythema or petechiae, no tonsillar hypertrophy or exudate, no uvular deviation, no unilateral swelling, no trismus or drooling, no muffled voice, normal phonation, no stridor, no dental caries present, no drainable dental abscess noted, no Ludwig's angina, tongue sits flat in the bottom of the mouth, no angioedema, no facial erythema or warmth, no facial swelling; no pain with movement of the neck. NECK: Supple, no meningismus, no nuchal rigidity, no LAD  CARD: RRR; S1 and S2 appreciated; no murmurs, no clicks, no rubs, no gallops RESP: Normal chest excursion without splinting or tachypnea; breath sounds clear and equal bilaterally; no wheezes, no rhonchi, no rales, no hypoxia or respiratory distress, speaking full sentences ABD/GI: Normal bowel sounds; non-distended; soft, non-tender, no rebound, no guarding, no peritoneal signs, no hepatosplenomegaly GU:  Normal external genitalia, circumcised male, normal penile shaft, no blood or discharge at the urethral meatus, no testicular masses or tenderness on exam, no scrotal masses or swelling, no hernias appreciated, 2+ femoral pulses bilaterally; no perineal erythema, warmth, subcutaneous air or crepitus; no high riding testicle, normal bilateral cremasteric reflex.  Chaperone present for exam. BACK:  The back appears normal and is non-tender to palpation, there is no CVA tenderness EXT: Normal ROM in all joints; non-tender to palpation; no edema; normal capillary refill; no cyanosis, no calf tenderness or swelling    SKIN: Normal color for age and race; warm; no rash NEURO: Moves all extremities equally, reports normal sensation diffusely, has a normal gait, normal speech PSYCH: Patient has a bizarre affect to report auditory hallucinations but denies SI, HI or command hallucinations. He  appears disheveled and has poor hygiene.  MEDICAL DECISION MAKING: Patient here with multiple complaints. His exam is extremely benign. I suspect a lot of this is secondary to his borderline intellectual functioning. I do not see any sign of pharyngitis, peritonsillar abscess, deep space neck infection. Doubt pneumonia or meningitis. Abdominal exam is benign. Doubt kidney stone, kidney infection, appendicitis, colitis, diverticulitis, bowel obstruction, pancreatitis, cholecystitis. He has had recent normal labs. His urine shows no sign of infection and we have sent a urine gonorrhea and chlamydia but denies any recent sexual activity. He has no current psychiatric emergent issues present. He states he is not homeless at this time and is staying with friends. He does not appear clinically intoxicated. I do not feel he needs further emergent workup. I feel he is safe to be discharged home.  At this time, I do not feel there is any life-threatening condition present. I have reviewed and discussed all results (EKG, imaging, lab, urine as appropriate) and exam findings with patient/family. I have reviewed nursing notes and appropriate previous records.  I feel the patient is safe to be discharged home without further emergent workup and can continue workup as an outpatient as needed. Discussed usual and customary return precautions. Patient/family verbalize understanding and are comfortable with this plan.  Outpatient follow-up has been provided if needed. All questions have been answered.      Ward, Layla Maw, DO 04/15/17 [phone]

## 2017-04-15 NOTE — ED Notes (Signed)
Called  No response from lobby 

## 2017-04-15 NOTE — Discharge Instructions (Signed)
You may alternate Tylenol 1000 mg every 6 hours as needed for pain and Ibuprofen 800 mg every 8 hours as needed for pain.  Please take Ibuprofen with food. ° ° ° °To find a primary care or specialty doctor please call [phone] or [phone] to access "[facility] Find a Doctor Service." ° °You may also go on the [facility] website at [email or web address] ° °There are also multiple [facility], [facility],  and [facility] practices throughout the [place] that are frequently accepting new patients. You may find a clinic that is close to your home and contact them. ° °[facility] -  °[address] °[place] [address] °[phone] ° ° °[facility] -  °[address] ° [place] [address] °[phone] ° ° °[facility] - °[address]  °[place]  [address] °[phone] ° ° °

## 2017-04-16 LAB — GC/CHLAMYDIA PROBE AMP (~~LOC~~) NOT AT ARMC
CHLAMYDIA, DNA PROBE: NEGATIVE
Neisseria Gonorrhea: NEGATIVE

## 2017-04-19 ENCOUNTER — Emergency Department (HOSPITAL_COMMUNITY)
Admission: EM | Admit: 2017-04-19 | Discharge: 2017-04-19 | Disposition: A | Discharge status: Home / Self Care | Payer: Self-pay | Attending: Emergency Medicine | Admitting: Emergency Medicine

## 2017-04-19 ENCOUNTER — Encounter (HOSPITAL_COMMUNITY): Payer: Self-pay | Admitting: Nurse Practitioner

## 2017-04-19 DIAGNOSIS — Z79899 Other long term (current) drug therapy: Secondary | ICD-10-CM | POA: Insufficient documentation

## 2017-04-19 DIAGNOSIS — R5381 Other malaise: Secondary | ICD-10-CM | POA: Insufficient documentation

## 2017-04-19 DIAGNOSIS — F17210 Nicotine dependence, cigarettes, uncomplicated: Secondary | ICD-10-CM | POA: Insufficient documentation

## 2017-04-19 NOTE — Discharge Instructions (Signed)
Make sure you are getting plenty of rest, and drinking a lot of fluids.  Follow-up with a primary care doctor for ongoing management of any concerns which you might have.  You can use the resource guide to help you find a doctor.

## 2017-04-19 NOTE — ED Triage Notes (Signed)
Pt is c/o back spasm, tremors, nausea, feeling sick and sleepy.

## 2017-04-19 NOTE — ED Provider Notes (Signed)
[facility]-EMERGENCY DEPT Provider Note   CSN: [id number] Arrival date & time: 04/19/17  0115     History   Chief Complaint Chief Complaint  Patient presents with  . Multiple Complains    HPI Collin White is a 21 y.o. male.  He is here for evaluation of "spasms," and trouble breathing.  He is unsure when these problems started.  He has had multiple recent ED evaluations for multiple problems.  No significant pathology has been found on the recent visits.  He is variably stated to be homeless, and having a place to live.  He denies other concurrent problems.  There are no other known modifying factors.   HPI  Past Medical History:  Diagnosis Date  . Seizures [name](HCC)     Patient Active Problem List   Diagnosis Date Noted  . 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 Psychoses   . Schizophrenia (HCC) 09/29/2015  . Cannabis use disorder, moderate, in early remission (HCC) 09/26/2015  . History of posttraumatic stress disorder (PTSD) 09/26/2015    Past Surgical History:  Procedure Laterality Date  . abd surgery s/p traumatic event    . facial reconstructive surgery    . NO PAST SURGERIES  patient stated he had facial reconstruction surgery as a child   . tumor removed from head          Home Medications    Prior to Admission medications   Medication Sig Start Date End Date Taking? Authorizing Provider  acetaminophen (TYLENOL) 500 MG tablet Take 1 tablet (500 mg total) by mouth every 6 (six) hours as needed. 03/19/17   Everlene Farrieransie, William, PA-C  amoxicillin (AMOXIL) 500 MG capsule Take 1 capsule (500 mg total) by mouth 3 (three) times daily. Patient not taking: Reported on 03/11/2017 03/06/16   Jaynie CrumbleKirichenko, Tatyana, PA-C  benztropine (COGENTIN) 1 MG tablet Take 1 tablet (1 mg total) by mouth 2 (two) times daily. Patient not taking: Reported on 01/24/2016 10/02/15   Adonis BrookAgustin, Sheila, NP  cyclobenzaprine (FLEXERIL) 5 MG tablet Take 1 tablet (5 mg total) by mouth 2 (two) times  daily as needed for muscle spasms. Patient not taking: Reported on 03/06/2016 02/15/16   Arthor CaptainHarris, Abigail, PA-C  dicyclomine (BENTYL) 20 MG tablet Take 1 tablet (20 mg total) by mouth 2 (two) times daily. 03/11/17   Audry PiliMohr, Tyler, PA-C  docusate sodium (COLACE) 100 MG capsule Take 1 capsule (100 mg total) by mouth every 12 (twelve) hours. Patient not taking: Reported on 03/06/2016 12/13/15   Derwood KaplanNanavati, Ankit, MD  haloperidol (HALDOL) 5 MG tablet Take on tablet (5 mg) in the morning.  Take 2 tabs (10 mg) at bedtime. Patient not taking: Reported on 01/24/2016 10/02/15   Adonis BrookAgustin, Sheila, NP  hydrOXYzine (ATARAX/VISTARIL) 25 MG tablet Take 1 tablet (25 mg total) by mouth 3 (three) times daily as needed for anxiety. Patient not taking: Reported on 01/24/2016 10/02/15   Adonis BrookAgustin, Sheila, NP  lidocaine (XYLOCAINE) 5 % ointment Apply 1 application topically as needed. Patient not taking: Reported on 03/06/2016 12/13/15   Derwood KaplanNanavati, Ankit, MD  ondansetron (ZOFRAN) 4 MG tablet Take 1 tablet (4 mg total) by mouth every 6 (six) hours. 03/11/17   Audry PiliMohr, Tyler, PA-C  Oxcarbazepine (TRILEPTAL) 300 MG tablet Take 1 tablet (300 mg total) by mouth 2 (two) times daily. Patient not taking: Reported on 01/24/2016 10/02/15   Adonis BrookAgustin, Sheila, NP    Family History Family History  Problem Relation Age of Onset  . Hypertension Other   . Mental illness Neg  Hx     Social History Social History  Substance Use Topics  . Smoking status: Current Every Day Smoker    Types: Cigarettes  . Smokeless tobacco: Never Used  . Alcohol use Yes     Comment: social     Allergies   Patient has no known allergies.   Review of Systems Review of Systems  All other systems reviewed and are negative.    Physical Exam Updated Vital Signs BP (!) 133/91 (BP Location: Left Arm)   Pulse 66   Temp 97.9 F (36.6 C) (Oral)   Resp 16   Ht 6' (1.829 m)   Wt 51.3 kg (113 lb)   SpO2 100%   BMI 15.33 kg/m   Physical Exam  Constitutional: He is  oriented to person, place, and time. He appears well-developed.  Disheveled, undernourished  HENT:  Head: Normocephalic and atraumatic.  Right Ear: External ear normal.  Left Ear: External ear normal.  Patient demonstrates his "spasms," is a forward movement of his neck and head, every 1-2 seconds.  This movement started after I walked into the room, following visualizing him through the window on the door before I entered.  Eyes: Pupils are equal, round, and reactive to light. Conjunctivae and EOM are normal.  Neck: Normal range of motion and phonation normal. Neck supple.  Cardiovascular: Normal rate, regular rhythm and normal heart sounds.   Pulmonary/Chest: Effort normal and breath sounds normal. He exhibits no bony tenderness.  Abdominal: Soft. There is no tenderness.  Musculoskeletal: Normal range of motion.  Neurological: He is alert and oriented to person, place, and time. No cranial nerve deficit or sensory deficit. He exhibits normal muscle tone. Coordination normal.  Skin: Skin is warm, dry and intact.  Psychiatric:  Anxious.  He has rhythmic motor activity, stops when he is distracted with request to do assist with examination.  Nursing note and vitals reviewed.    ED Treatments / Results  Labs (all labs ordered are listed, but only abnormal results are displayed) Labs Reviewed - No data to display  EKG  EKG Interpretation None       Radiology No results found.  Procedures Procedures (including critical care time)  Medications Ordered in ED Medications - No data to display   Initial Impression / Assessment and Plan / ED Course  I have reviewed the triage vital signs and the nursing notes.  Pertinent labs & imaging results that were available during my care of the patient were reviewed by me and considered in my medical decision making (see chart for details).      Patient Vitals for the past 24 hrs:  BP Temp Temp src Pulse Resp SpO2 Height Weight    04/19/17 0515 (!) 133/91 - - 66 16 100 % - -  04/19/17 0418 129/84 97.9 F (36.6 C) Oral 60 18 96 % - -  04/19/17 0129 (!) 130/95 97.6 F (36.4 C) Oral 60 18 100 % 6' (1.829 m) 51.3 kg (113 lb)    5:44 AM Reevaluation with update and discussion. After initial assessment and treatment, an updated evaluation reveals no change in clinical status.  Findings discussed with the patient and all questions answered. Tianna Baus L      Final Clinical Impressions(s) / ED Diagnoses   Final diagnoses:  Malaise   Nonspecific complaints, with reassuring evaluation, clinically.  Recent multiple evaluations including lab work, without significant abnormal findings.  There is no indication today for acute unstable medical conditions.  Nursing  Notes Reviewed/ Care Coordinated Applicable Imaging Reviewed Interpretation of Laboratory Data incorporated into ED treatment  The patient appears reasonably screened and/or stabilized for discharge and I doubt any other medical condition or other [facility] requiring further screening, evaluation, or treatment in the ED at this time prior to discharge.  Plan: Home Medications-OTC, as needed; Home Treatments-rest; return here if the recommended treatment, does not improve the symptoms; Recommended follow up-PCP, as needed   New Prescriptions New Prescriptions   No medications on file     Mancel Bale, MD 04/19/17 [phone]

## 2017-04-21 ENCOUNTER — Emergency Department (HOSPITAL_COMMUNITY): Payer: Self-pay

## 2017-04-21 ENCOUNTER — Encounter (HOSPITAL_COMMUNITY): Payer: Self-pay | Admitting: Emergency Medicine

## 2017-04-21 ENCOUNTER — Emergency Department (HOSPITAL_COMMUNITY)
Admission: EM | Admit: 2017-04-21 | Discharge: 2017-04-21 | Disposition: A | Discharge status: Home / Self Care | Payer: Self-pay | Attending: Emergency Medicine | Admitting: Emergency Medicine

## 2017-04-21 DIAGNOSIS — F17210 Nicotine dependence, cigarettes, uncomplicated: Secondary | ICD-10-CM | POA: Insufficient documentation

## 2017-04-21 DIAGNOSIS — Y929 Unspecified place or not applicable: Secondary | ICD-10-CM | POA: Insufficient documentation

## 2017-04-21 DIAGNOSIS — S62101A Fracture of unspecified carpal bone, right wrist, initial encounter for closed fracture: Secondary | ICD-10-CM | POA: Insufficient documentation

## 2017-04-21 DIAGNOSIS — Z79899 Other long term (current) drug therapy: Secondary | ICD-10-CM | POA: Insufficient documentation

## 2017-04-21 DIAGNOSIS — Y939 Activity, unspecified: Secondary | ICD-10-CM | POA: Insufficient documentation

## 2017-04-21 DIAGNOSIS — Y999 Unspecified external cause status: Secondary | ICD-10-CM | POA: Insufficient documentation

## 2017-04-21 DIAGNOSIS — W2209XA Striking against other stationary object, initial encounter: Secondary | ICD-10-CM | POA: Insufficient documentation

## 2017-04-21 MED ORDER — IBUPROFEN 800 MG PO TABS
800.0000 mg | ORAL_TABLET | Freq: Once | ORAL | Status: AC
  Administered 2017-04-21: 800 mg via ORAL
  Filled 2017-04-21: qty 1

## 2017-04-21 MED ORDER — IBUPROFEN 800 MG PO TABS: 800.0000 mg | ORAL_TABLET | Freq: Three times a day (TID) | ORAL | 0 refills | Status: DC

## 2017-04-21 NOTE — ED Notes (Signed)
Secretary reports paging ortho tech 4 times, without answer. [facility] and [facility] ortho, no response.

## 2017-04-21 NOTE — Progress Notes (Signed)
Orthopedic Tech Progress Note Patient Details:  Collin White 07/10/1994 [id number]  Ortho Devices Type of Ortho Device: Arm sling, Volar splint Ortho Device/Splint Location: rue Ortho Device/Splint Interventions: Ordered, Application, Adjustment   Trinna PostMartinez, Shavone Nevers J 04/21/2017, 4:55 AM
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
## 2017-04-21 NOTE — Discharge Instructions (Signed)
As discussed, follow the rice protocol provided and take ibuprofen as needed for pain and swelling. Follow up with Dr. Merlyn LotKuzma in Office.  Return if swelling worsens, redness, numbness tightness in your arm or other new concerning symptoms in the meantime.

## 2017-04-21 NOTE — ED Notes (Signed)
Secretary notified to page ortho tech 
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
## 2017-04-24 ENCOUNTER — Emergency Department (HOSPITAL_COMMUNITY)
Admission: EM | Admit: 2017-04-24 | Discharge: 2017-04-24 | Disposition: A | Discharge status: Home / Self Care | Payer: Self-pay | Attending: Emergency Medicine | Admitting: Emergency Medicine

## 2017-04-24 DIAGNOSIS — Z5321 Procedure and treatment not carried out due to patient leaving prior to being seen by health care provider: Secondary | ICD-10-CM | POA: Insufficient documentation
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
## 2017-04-24 NOTE — ED Notes (Signed)
Pt has been sleeping in lobby all day, when pt woke up he decided to check in to be seen.

## 2017-04-26 DIAGNOSIS — Y929 Unspecified place or not applicable: Secondary | ICD-10-CM | POA: Insufficient documentation

## 2017-04-26 DIAGNOSIS — F17210 Nicotine dependence, cigarettes, uncomplicated: Secondary | ICD-10-CM | POA: Insufficient documentation

## 2017-04-26 DIAGNOSIS — W19XXXA Unspecified fall, initial encounter: Secondary | ICD-10-CM | POA: Insufficient documentation

## 2017-04-26 DIAGNOSIS — Y999 Unspecified external cause status: Secondary | ICD-10-CM | POA: Insufficient documentation

## 2017-04-26 DIAGNOSIS — S60211A Contusion of right wrist, initial encounter: Secondary | ICD-10-CM | POA: Insufficient documentation

## 2017-04-26 DIAGNOSIS — Y9301 Activity, walking, marching and hiking: Secondary | ICD-10-CM | POA: Insufficient documentation

## 2017-04-27 ENCOUNTER — Emergency Department (HOSPITAL_COMMUNITY)
Admission: EM | Admit: 2017-04-27 | Discharge: 2017-04-27 | Disposition: A | Discharge status: Home / Self Care | Payer: Self-pay | Attending: Emergency Medicine | Admitting: Emergency Medicine

## 2017-04-27 ENCOUNTER — Encounter (HOSPITAL_COMMUNITY): Payer: Self-pay | Admitting: *Deleted

## 2017-04-27 DIAGNOSIS — W19XXXA Unspecified fall, initial encounter: Secondary | ICD-10-CM | POA: Insufficient documentation

## 2017-04-27 DIAGNOSIS — R0602 Shortness of breath: Secondary | ICD-10-CM | POA: Insufficient documentation

## 2017-04-27 DIAGNOSIS — Z590 Homelessness: Secondary | ICD-10-CM | POA: Insufficient documentation

## 2017-04-27 DIAGNOSIS — J029 Acute pharyngitis, unspecified: Secondary | ICD-10-CM | POA: Insufficient documentation

## 2017-04-27 DIAGNOSIS — F17210 Nicotine dependence, cigarettes, uncomplicated: Secondary | ICD-10-CM | POA: Insufficient documentation

## 2017-04-27 DIAGNOSIS — Y939 Activity, unspecified: Secondary | ICD-10-CM | POA: Insufficient documentation

## 2017-04-27 DIAGNOSIS — Y929 Unspecified place or not applicable: Secondary | ICD-10-CM | POA: Insufficient documentation

## 2017-04-27 DIAGNOSIS — S60211A Contusion of right wrist, initial encounter: Secondary | ICD-10-CM

## 2017-04-27 DIAGNOSIS — S62144A Nondisplaced fracture of body of hamate [unciform] bone, right wrist, initial encounter for closed fracture: Secondary | ICD-10-CM | POA: Insufficient documentation

## 2017-04-27 DIAGNOSIS — Y999 Unspecified external cause status: Secondary | ICD-10-CM | POA: Insufficient documentation

## 2017-04-27 NOTE — Discharge Instructions (Signed)
Please see the hand surgeon as requested.
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
## 2017-04-27 NOTE — Progress Notes (Signed)
Orthopedic Tech Progress Note Patient Details:  Collin White 07/10/1994 [id number]  Ortho Devices Type of Ortho Device: Velcro wrist splint Ortho Device/Splint Location: rue Ortho Device/Splint Interventions: Ordered, Adjustment, Application   Trinna PostMartinez, Tyreesha Maharaj J 04/27/2017, 6:22 AM
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
## 2017-04-28 ENCOUNTER — Emergency Department (HOSPITAL_COMMUNITY)
Admission: EM | Admit: 2017-04-28 | Discharge: 2017-04-28 | Disposition: A | Discharge status: Home / Self Care | Payer: Self-pay | Attending: Emergency Medicine | Admitting: Emergency Medicine

## 2017-04-28 ENCOUNTER — Encounter (HOSPITAL_COMMUNITY): Payer: Self-pay

## 2017-04-28 ENCOUNTER — Other Ambulatory Visit: Payer: Self-pay

## 2017-04-28 DIAGNOSIS — S62144A Nondisplaced fracture of body of hamate [unciform] bone, right wrist, initial encounter for closed fracture: Secondary | ICD-10-CM

## 2017-04-28 DIAGNOSIS — J029 Acute pharyngitis, unspecified: Secondary | ICD-10-CM

## 2017-04-28 MED ORDER — ACETAMINOPHEN 500 MG PO TABS
1000.0000 mg | ORAL_TABLET | Freq: Once | ORAL | Status: AC
  Administered 2017-04-28: 1000 mg via ORAL
  Filled 2017-04-28: qty 2

## 2017-04-28 NOTE — Progress Notes (Signed)
   04/28/17 0900  Clinical Encounter Type  Visited With Health care provider;Patient not available  Visit Type Initial;Psychological support;Spiritual support;ED  Referral From Nurse  Consult/Referral To Chaplain  Spiritual Encounters  Spiritual Needs Other (Comment) (Clothing )  Stress Factors  Patient Stress Factors Other (Comment) (Clothing )   Patient was in shower upon my visit. I provided the patient with clothing for his discharge.   Please, contact Spiritual Care for further assistance.   Chaplain Clint BolderBrittany Dorrell Mitcheltree M.Div.

## 2017-04-28 NOTE — ED Triage Notes (Signed)
Pt states that he has R arm pain. He states that he fell and hurt it yesterday. When asked if he tripped or passed out, he said "both." Pt reports that he is homeless and has very poor hygiene. He has a sling around his neck, but neither arm is in it. He is able to move and use both arms appropriately. He also states, "my tonsils hurt and my arm is making it hard for me to talk and breathe." Pt respirations are even and unlabored. No facial droop or unilateral weakness. Ambulated with a steady gait and A&Ox4.

## 2017-04-28 NOTE — ED Notes (Signed)
Patient taking a shower and clean clothes provided by chaplin. Will do EKG once patient returns to room
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
## 2017-05-02 ENCOUNTER — Other Ambulatory Visit: Payer: Self-pay

## 2017-05-02 ENCOUNTER — Emergency Department (HOSPITAL_COMMUNITY)
Admission: EM | Admit: 2017-05-02 | Discharge: 2017-05-03 | Disposition: A | Discharge status: Home / Self Care | Payer: Self-pay | Attending: Emergency Medicine | Admitting: Emergency Medicine

## 2017-05-02 ENCOUNTER — Emergency Department (HOSPITAL_COMMUNITY): Payer: Self-pay

## 2017-05-02 ENCOUNTER — Encounter (HOSPITAL_COMMUNITY): Payer: Self-pay | Admitting: Emergency Medicine

## 2017-05-02 DIAGNOSIS — R4183 Borderline intellectual functioning: Secondary | ICD-10-CM | POA: Insufficient documentation

## 2017-05-02 DIAGNOSIS — R072 Precordial pain: Secondary | ICD-10-CM

## 2017-05-02 DIAGNOSIS — F17210 Nicotine dependence, cigarettes, uncomplicated: Secondary | ICD-10-CM | POA: Insufficient documentation

## 2017-05-02 DIAGNOSIS — Z79899 Other long term (current) drug therapy: Secondary | ICD-10-CM | POA: Insufficient documentation

## 2017-05-02 DIAGNOSIS — N5082 Scrotal pain: Secondary | ICD-10-CM | POA: Insufficient documentation

## 2017-05-02 LAB — BASIC METABOLIC PANEL
Anion gap: 7 (ref 5–15)
BUN: 20 mg/dL (ref 6–20)
CALCIUM: 9.6 mg/dL (ref 8.9–10.3)
CHLORIDE: 107 mmol/L (ref 101–111)
CO2: 27 mmol/L (ref 22–32)
Creatinine, Ser: 1.05 mg/dL (ref 0.61–1.24)
GFR calc non Af Amer: >60 mL/min (ref >60)
Glucose, Bld: 93 mg/dL (ref 65–99)
Potassium: 4 mmol/L (ref 3.5–5.1)
SODIUM: 141 mmol/L (ref 135–145)

## 2017-05-02 LAB — CBC
HCT: 41.7 % (ref 39.0–52.0)
Hemoglobin: 14.1 g/dL (ref 13.0–17.0)
MCH: 28.5 pg (ref 26.0–34.0)
MCHC: 33.8 g/dL (ref 30.0–36.0)
MCV: 84.2 fL (ref 78.0–100.0)
Platelets: 199 10*3/uL (ref 150–400)
RBC: 4.95 MIL/uL (ref 4.22–5.81)
RDW: 14.2 % (ref 11.5–15.5)
WBC: 7.9 10*3/uL (ref 4.0–10.5)

## 2017-05-02 LAB — POCT I-STAT TROPONIN I: TROPONIN I, POC: 0.01 ng/mL (ref 0.00–0.08)
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
## 2017-05-03 ENCOUNTER — Encounter (HOSPITAL_COMMUNITY): Payer: Self-pay | Admitting: Emergency Medicine

## 2017-05-03 ENCOUNTER — Emergency Department (HOSPITAL_COMMUNITY): Payer: Self-pay

## 2017-05-03 LAB — URINALYSIS, ROUTINE W REFLEX MICROSCOPIC
BACTERIA UA: NONE SEEN
Bilirubin Urine: NEGATIVE
Glucose, UA: NEGATIVE mg/dL
Hgb urine dipstick: NEGATIVE
Ketones, ur: NEGATIVE mg/dL
Nitrite: NEGATIVE
Protein, ur: NEGATIVE mg/dL
RBC / HPF: NONE SEEN RBC/hpf (ref 0–5)
SPECIFIC GRAVITY, URINE: 1.028 (ref 1.005–1.030)
SQUAMOUS EPITHELIAL / LPF: NONE SEEN
pH: 7 (ref 5.0–8.0)

## 2017-05-03 MED ORDER — ACETAMINOPHEN 500 MG PO TABS
1000.0000 mg | ORAL_TABLET | Freq: Once | ORAL | Status: AC
Start: 2017-05-03 — End: 2017-05-03
  Administered 2017-05-03: 1000 mg via ORAL
  Filled 2017-05-03: qty 2
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
## 2017-05-04 DIAGNOSIS — F17210 Nicotine dependence, cigarettes, uncomplicated: Secondary | ICD-10-CM | POA: Insufficient documentation

## 2017-05-04 DIAGNOSIS — I861 Scrotal varices: Secondary | ICD-10-CM | POA: Insufficient documentation

## 2017-05-05 ENCOUNTER — Encounter (HOSPITAL_COMMUNITY): Payer: Self-pay | Admitting: Emergency Medicine

## 2017-05-05 ENCOUNTER — Emergency Department (HOSPITAL_COMMUNITY)
Admission: EM | Admit: 2017-05-05 | Discharge: 2017-05-05 | Disposition: A | Discharge status: Home / Self Care | Payer: Self-pay | Attending: Emergency Medicine | Admitting: Emergency Medicine

## 2017-05-05 DIAGNOSIS — Z5321 Procedure and treatment not carried out due to patient leaving prior to being seen by health care provider: Secondary | ICD-10-CM | POA: Insufficient documentation

## 2017-05-05 DIAGNOSIS — I861 Scrotal varices: Secondary | ICD-10-CM

## 2017-05-05 NOTE — ED Triage Notes (Signed)
Pt states "my balls hurt and it is pushing up making my organs and chest hurt"

## 2017-05-05 NOTE — ED Triage Notes (Addendum)
Pt from home with complaints of testicle pain, right foot pain, and right wrist pain. Pt states pain has been ongoing x 3 weeks. Pt is ambulatory without distress. Pt has brace on wrist that he states has been present x 2 weeks. Pt denies trauma. Pt denies discharge from penis or urinary symptoms.
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
## 2017-05-06 ENCOUNTER — Emergency Department (HOSPITAL_COMMUNITY)
Admission: EM | Admit: 2017-05-06 | Discharge: 2017-05-06 | Discharge status: Against Medical Advice | Payer: Self-pay | Attending: Emergency Medicine | Admitting: Emergency Medicine

## 2017-05-06 NOTE — ED Notes (Signed)
Bed: [facility]23 Expected date:  Expected time:  Means of arrival:  Comments: 

## 2017-05-06 NOTE — ED Notes (Signed)
No answer when called for vitals. 

## 2017-05-06 NOTE — ED Notes (Signed)
No answer when called for room 

## 2017-05-07 ENCOUNTER — Emergency Department (HOSPITAL_COMMUNITY): Admission: EM | Admit: 2017-05-07 | Discharge: 2017-05-07 | Discharge status: Against Medical Advice | Payer: Self-pay

## 2017-05-07 ENCOUNTER — Emergency Department (HOSPITAL_COMMUNITY)
Admission: EM | Admit: 2017-05-07 | Discharge: 2017-05-07 | Disposition: A | Discharge status: Home / Self Care | Payer: Self-pay | Attending: Emergency Medicine | Admitting: Emergency Medicine

## 2017-05-07 ENCOUNTER — Encounter (HOSPITAL_COMMUNITY): Payer: Self-pay | Admitting: Emergency Medicine

## 2017-05-07 DIAGNOSIS — R110 Nausea: Secondary | ICD-10-CM | POA: Insufficient documentation

## 2017-05-07 DIAGNOSIS — Z590 Homelessness: Secondary | ICD-10-CM | POA: Insufficient documentation

## 2017-05-07 DIAGNOSIS — F17210 Nicotine dependence, cigarettes, uncomplicated: Secondary | ICD-10-CM | POA: Insufficient documentation

## 2017-05-07 DIAGNOSIS — Z79899 Other long term (current) drug therapy: Secondary | ICD-10-CM | POA: Insufficient documentation

## 2017-05-07 DIAGNOSIS — R4183 Borderline intellectual functioning: Secondary | ICD-10-CM | POA: Insufficient documentation

## 2017-05-07 DIAGNOSIS — R1084 Generalized abdominal pain: Secondary | ICD-10-CM | POA: Insufficient documentation

## 2017-05-07 MED ORDER — ACETAMINOPHEN 325 MG PO TABS
650.0000 mg | ORAL_TABLET | Freq: Once | ORAL | Status: AC
  Administered 2017-05-07: 650 mg via ORAL
  Filled 2017-05-07: qty 2

## 2017-05-07 MED ORDER — ONDANSETRON 4 MG PO TBDP: 4.0000 mg | ORAL_TABLET | Freq: Three times a day (TID) | ORAL | 0 refills | Status: DC | PRN

## 2017-05-07 MED ORDER — ONDANSETRON 4 MG PO TBDP
4.0000 mg | ORAL_TABLET | Freq: Once | ORAL | Status: AC
  Administered 2017-05-07: 4 mg via ORAL
  Filled 2017-05-07: qty 1

## 2017-05-07 NOTE — Discharge Instructions (Signed)
Return if any problems.

## 2017-05-07 NOTE — ED Triage Notes (Signed)
Pt not in the lobby 

## 2017-05-07 NOTE — ED Triage Notes (Signed)
Pt complaint of nausea onset 2 days ago; noted nausea when eating frappe this morning and couldn't finish chips and salsa this afternoon. Pt denies v/d.

## 2017-05-07 NOTE — ED Provider Notes (Signed)
[facility]-EMERGENCY DEPT Provider Note   CSN: [id number] Arrival date & time: 05/07/17  1622     History   Chief Complaint Chief Complaint  Patient presents with  . Nausea    HPI Collin White is a 21 y.o. male.  The history is provided by the patient. No language interpreter was used.  Abdominal Pain   This is a new problem. The current episode started 6 to 12 hours ago. The problem occurs constantly. The problem has not changed since onset.The pain is moderate. Pertinent negatives include anorexia and fever. Nothing aggravates the symptoms. Nothing relieves the symptoms.  Pt reports he had nausea after drinking a coffee drink.    Past Medical History:  Diagnosis Date  . Seizures [facility])     Patient Active Problem List   Diagnosis Date Noted  . 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 Psychoses   . Schizophrenia (HCC) 09/29/2015  . Cannabis use disorder, moderate, in early remission (HCC) 09/26/2015  . History of posttraumatic stress disorder (PTSD) 09/26/2015    Past Surgical History:  Procedure Laterality Date  . abd surgery s/p traumatic event    . facial reconstructive surgery    . NO PAST SURGERIES  patient stated he had facial reconstruction surgery as a child   . tumor removed from head          Home Medications    Prior to Admission medications   Medication Sig Start Date End Date Taking? Authorizing Provider  benztropine (COGENTIN) 1 MG tablet Take 1 tablet (1 mg total) by mouth 2 (two) times daily. Patient not taking: Reported on 05/05/2017 10/02/15   Adonis Brook, NP  haloperidol (HALDOL) 5 MG tablet Take on tablet (5 mg) in the morning.  Take 2 tabs (10 mg) at bedtime. Patient not taking: Reported on 01/24/2016 10/02/15   Adonis Brook, NP  hydrOXYzine (ATARAX/VISTARIL) 25 MG tablet Take 1 tablet (25 mg total) by mouth 3 (three) times daily as needed for anxiety. Patient not taking: Reported on 01/24/2016 10/02/15   Adonis Brook, NP    ondansetron (ZOFRAN ODT) 4 MG disintegrating tablet Take 1 tablet (4 mg total) by mouth every 8 (eight) hours as needed for nausea or vomiting. 05/07/17   Elson Areas, PA-C  Oxcarbazepine (TRILEPTAL) 300 MG tablet Take 1 tablet (300 mg total) by mouth 2 (two) times daily. Patient not taking: Reported on 01/24/2016 10/02/15   Adonis Brook, NP    Family History Family History  Problem Relation Age of Onset  . Hypertension Other   . Mental illness Neg Hx     Social History Social History  Substance Use Topics  . Smoking status: Current Every Day Smoker    Types: Cigarettes  . Smokeless tobacco: Never Used  . Alcohol use No     Allergies   Patient has no known allergies.   Review of Systems Review of Systems  Constitutional: Negative for fever.  Gastrointestinal: Positive for abdominal pain. Negative for anorexia.  All other systems reviewed and are negative.    Physical Exam Updated Vital Signs BP 121/73 (BP Location: Left Arm)   Pulse 66   Temp 98 F (36.7 C) (Oral)   Resp 18   SpO2 100%   Physical Exam  Constitutional: He appears well-developed and well-nourished.  Eyes: Pupils are equal, round, and reactive to light.  Neck: Normal range of motion.  Cardiovascular: Normal rate.   Pulmonary/Chest: Effort normal.  Abdominal: Soft. There is no tenderness.  Musculoskeletal: Normal range  of motion.  Neurological: He is alert.  Skin: Skin is warm.  Psychiatric: He has a normal mood and affect.  Nursing note and vitals reviewed.    ED Treatments / Results  Labs (all labs ordered are listed, but only abnormal results are displayed) Labs Reviewed - No data to display  EKG  EKG Interpretation None       Radiology No results found.  Procedures Procedures (including critical care time)  Medications Ordered in ED Medications  acetaminophen (TYLENOL) tablet 650 mg (650 mg Oral Given 05/07/17 2008)  ondansetron (ZOFRAN-ODT) disintegrating tablet 4 mg  (4 mg Oral Given 05/07/17 2008)     Initial Impression / Assessment and Plan / ED Course  I have reviewed the triage vital signs and the nursing notes.  Pertinent labs & imaging results that were available during my care of the patient were reviewed by me and considered in my medical decision making (see chart for details).     Pt has normal exam.  Pt given tylenol and zofran.  Pt able to tolerate fluids.  Pt has had recent labs,  No diabetes symptoms,  Normal exam.   Final Clinical Impressions(s) / ED Diagnoses   Final diagnoses:  Nausea  Generalized abdominal pain    New Prescriptions Discharge Medication List as of 05/07/2017  8:05 PM    START taking these medications   Details  ondansetron (ZOFRAN ODT) 4 MG disintegrating tablet Take 1 tablet (4 mg total) by mouth every 8 (eight) hours as needed for nausea or vomiting., Starting Thu 05/07/2017, Print      An After Visit Summary was printed and given to the patient.    Elson Areas, PA-C 05/07/17 2243    Lavera Guise, MD 05/08/17 [phone]

## 2017-05-08 ENCOUNTER — Encounter (HOSPITAL_COMMUNITY): Payer: Self-pay

## 2017-05-08 ENCOUNTER — Emergency Department (HOSPITAL_COMMUNITY)
Admission: EM | Admit: 2017-05-08 | Discharge: 2017-05-08 | Disposition: A | Discharge status: Home / Self Care | Payer: Self-pay | Attending: Emergency Medicine | Admitting: Emergency Medicine

## 2017-05-08 DIAGNOSIS — R110 Nausea: Secondary | ICD-10-CM

## 2017-05-08 LAB — I-STAT CHEM 8, ED
BUN: 18 mg/dL (ref 6–20)
Calcium, Ion: 1.25 mmol/L (ref 1.15–1.40)
Chloride: 105 mmol/L (ref 101–111)
Creatinine, Ser: 1.1 mg/dL (ref 0.61–1.24)
Glucose, Bld: 80 mg/dL (ref 65–99)
HEMATOCRIT: 46 % (ref 39.0–52.0)
HEMOGLOBIN: 15.6 g/dL (ref 13.0–17.0)
Potassium: 3.6 mmol/L (ref 3.5–5.1)
Sodium: 143 mmol/L (ref 135–145)
TCO2: 28 mmol/L (ref 22–32)

## 2017-05-08 MED ORDER — GI COCKTAIL ~~LOC~~
30.0000 mL | Freq: Once | ORAL | Status: AC
  Administered 2017-05-08: 30 mL via ORAL
  Filled 2017-05-08: qty 30

## 2017-05-08 MED ORDER — PROMETHAZINE HCL 25 MG PO TABS: 25.0000 mg | ORAL_TABLET | Freq: Four times a day (QID) | ORAL | 0 refills | Status: DC | PRN

## 2017-05-08 MED ORDER — ONDANSETRON 8 MG PO TBDP
8.0000 mg | ORAL_TABLET | Freq: Once | ORAL | Status: AC
  Administered 2017-05-08: 8 mg via ORAL
  Filled 2017-05-08: qty 1

## 2017-05-08 NOTE — ED Triage Notes (Signed)
Pt states that he's been vomiting all day He has been here several times this week and never leaves the parking lot

## 2017-05-08 NOTE — ED Provider Notes (Signed)
[facility] Provider Note   CSN: [id number] Arrival date & time: 05/07/17  2243     History   Chief Complaint Chief Complaint  Patient presents with  . Emesis    HPI Collin White is a 21 y.o. male.  21 y/o male with hx of seizures and homelessness presents to the ED for evaluation of nausea. Patient seen yesterday for same. He states that he is "throwing up inside my body" because he can "feel it in my throat". No medications taken PTA for symptoms. No modifying factors. No fevers. Patient with hx of 17 ED visits in the last 6 months, 5 of which have been in the past week.      Past Medical History:  Diagnosis Date  . Seizures [facility])     Patient Active Problem List   Diagnosis Date Noted  . 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 Psychoses   . Schizophrenia (HCC) 09/29/2015  . Cannabis use disorder, moderate, in early remission (HCC) 09/26/2015  . History of posttraumatic stress disorder (PTSD) 09/26/2015    Past Surgical History:  Procedure Laterality Date  . abd surgery s/p traumatic event    . facial reconstructive surgery    . NO PAST SURGERIES  patient stated he had facial reconstruction surgery as a child   . tumor removed from head          Home Medications    Prior to Admission medications   Medication Sig Start Date End Date Taking? Authorizing Provider  benztropine (COGENTIN) 1 MG tablet Take 1 tablet (1 mg total) by mouth 2 (two) times daily. Patient not taking: Reported on 05/05/2017 10/02/15   Adonis Brook, NP  haloperidol (HALDOL) 5 MG tablet Take on tablet (5 mg) in the morning.  Take 2 tabs (10 mg) at bedtime. Patient not taking: Reported on 01/24/2016 10/02/15   Adonis Brook, NP  hydrOXYzine (ATARAX/VISTARIL) 25 MG tablet Take 1 tablet (25 mg total) by mouth 3 (three) times daily as needed for anxiety. Patient not taking: Reported on 01/24/2016 10/02/15   Adonis Brook, NP  Oxcarbazepine (TRILEPTAL) 300 MG tablet Take 1  tablet (300 mg total) by mouth 2 (two) times daily. Patient not taking: Reported on 01/24/2016 10/02/15   Adonis Brook, NP  promethazine (PHENERGAN) 25 MG tablet Take 1 tablet (25 mg total) by mouth every 6 (six) hours as needed for nausea or vomiting. 05/08/17   Antony Madura, PA-C    Family History Family History  Problem Relation Age of Onset  . Hypertension Other   . Mental illness Neg Hx     Social History Social History  Substance Use Topics  . Smoking status: Current Every Day Smoker    Types: Cigarettes  . Smokeless tobacco: Never Used  . Alcohol use No     Allergies   Patient has no known allergies.   Review of Systems Review of Systems Ten systems reviewed and are negative for acute change, except as noted in the HPI.    Physical Exam Updated Vital Signs BP (!) 129/97 (BP Location: Left Arm)   Pulse 69   Temp 97.7 F (36.5 C) (Oral)   Resp 18   SpO2 100%   Physical Exam  Constitutional: He is oriented to person, place, and time. He appears well-developed and well-nourished. No distress.  Disheveled and smelly. Nontoxic and in NAD  HENT:  Head: Normocephalic and atraumatic.  Eyes: Conjunctivae and EOM are normal. No scleral icterus.  Neck: Normal range of motion.  Cardiovascular: Normal rate, regular rhythm and intact distal pulses.   Pulmonary/Chest: Effort normal. No respiratory distress.  Respirations even and unlabored  Abdominal: Soft. He exhibits no distension. There is no guarding.  Soft, nondistended abdomen. No peritoneal signs or masses.  Musculoskeletal: Normal range of motion.  Neurological: He is alert and oriented to person, place, and time. He exhibits normal muscle tone. Coordination normal.  Skin: Skin is warm and dry. No rash noted. He is not diaphoretic. No erythema. No pallor.  Psychiatric: He has a normal mood and affect. His behavior is normal.  Nursing note and vitals reviewed.    ED Treatments / Results  Labs (all labs  ordered are listed, but only abnormal results are displayed) Labs Reviewed  I-STAT CHEM 8, ED    EKG  EKG Interpretation None       Radiology No results found.  Procedures Procedures (including critical care time)  Medications Ordered in ED Medications  ondansetron (ZOFRAN-ODT) disintegrating tablet 8 mg (8 mg Oral Given 05/08/17 0436)  gi cocktail (Maalox,Lidocaine,Donnatal) (30 mLs Oral Given 05/08/17 0527)     Initial Impression / Assessment and Plan / ED Course  I have reviewed the triage vital signs and the nursing notes.  Pertinent labs & imaging results that were available during my care of the patient were reviewed by me and considered in my medical decision making (see chart for details).     21 year old male presents to the emergency department for nausea. He presented yesterday for the same complaints. He has been seen 5 times in the last week for various symptoms. He is disheveled and foul smelling, obviously homeless. Patient is nontoxic and sleeping during my initial assessment. Despite his complaints of nausea, he has not had any visible emesis since ED arrival. He has no evidence of acute surgical abdomen on exam. Chemistry panel is reassuring. Plan to discharge on Phenergan. I do not believe further emergent workup is indicated. Return precautions discussed and provided. Patient discharged in stable with referral to [facility].   Final Clinical Impressions(s) / ED Diagnoses   Final diagnoses:  Nausea    New Prescriptions Discharge Medication List as of 05/08/2017  5:20 AM    START taking these medications   Details  promethazine (PHENERGAN) 25 MG tablet Take 1 tablet (25 mg total) by mouth every 6 (six) hours as needed for nausea or vomiting., Starting Fri 05/08/2017, Print         Antony Madura, PA-C 05/08/17 0600    Palumbo, April, MD 05/08/17 [phone]

## 2017-05-10 DIAGNOSIS — Y999 Unspecified external cause status: Secondary | ICD-10-CM | POA: Insufficient documentation

## 2017-05-10 DIAGNOSIS — X58XXXA Exposure to other specified factors, initial encounter: Secondary | ICD-10-CM | POA: Insufficient documentation

## 2017-05-10 DIAGNOSIS — Z79899 Other long term (current) drug therapy: Secondary | ICD-10-CM | POA: Insufficient documentation

## 2017-05-10 DIAGNOSIS — Y939 Activity, unspecified: Secondary | ICD-10-CM | POA: Insufficient documentation

## 2017-05-10 DIAGNOSIS — F17210 Nicotine dependence, cigarettes, uncomplicated: Secondary | ICD-10-CM | POA: Insufficient documentation

## 2017-05-10 DIAGNOSIS — S46912A Strain of unspecified muscle, fascia and tendon at shoulder and upper arm level, left arm, initial encounter: Secondary | ICD-10-CM | POA: Insufficient documentation

## 2017-05-10 DIAGNOSIS — R1033 Periumbilical pain: Secondary | ICD-10-CM | POA: Insufficient documentation

## 2017-05-10 DIAGNOSIS — Y929 Unspecified place or not applicable: Secondary | ICD-10-CM | POA: Insufficient documentation

## 2017-05-11 ENCOUNTER — Encounter (HOSPITAL_COMMUNITY): Payer: Self-pay | Admitting: *Deleted

## 2017-05-11 ENCOUNTER — Emergency Department (HOSPITAL_COMMUNITY)
Admission: EM | Admit: 2017-05-11 | Discharge: 2017-05-11 | Disposition: A | Discharge status: Home / Self Care | Payer: Self-pay | Attending: Emergency Medicine | Admitting: Emergency Medicine

## 2017-05-11 DIAGNOSIS — R1033 Periumbilical pain: Secondary | ICD-10-CM

## 2017-05-11 DIAGNOSIS — T148XXA Other injury of unspecified body region, initial encounter: Secondary | ICD-10-CM

## 2017-05-11 DIAGNOSIS — M25512 Pain in left shoulder: Secondary | ICD-10-CM

## 2017-05-11 LAB — CBC WITH DIFFERENTIAL/PLATELET
BASOS PCT: 0 %
Basophils Absolute: 0 10*3/uL (ref 0.0–0.1)
EOS PCT: 1 %
Eosinophils Absolute: 0 10*3/uL (ref 0.0–0.7)
HCT: 42.9 % (ref 39.0–52.0)
Hemoglobin: 14.5 g/dL (ref 13.0–17.0)
LYMPHS PCT: 25 %
Lymphs Abs: 1.3 10*3/uL (ref 0.7–4.0)
MCH: 28.5 pg (ref 26.0–34.0)
MCHC: 33.8 g/dL (ref 30.0–36.0)
MCV: 84.3 fL (ref 78.0–100.0)
Monocytes Absolute: 0.4 10*3/uL (ref 0.1–1.0)
Monocytes Relative: 7 %
NEUTROS ABS: 3.4 10*3/uL (ref 1.7–7.7)
Neutrophils Relative %: 67 %
PLATELETS: 193 10*3/uL (ref 150–400)
RBC: 5.09 MIL/uL (ref 4.22–5.81)
RDW: 14 % (ref 11.5–15.5)
WBC: 5.1 10*3/uL (ref 4.0–10.5)

## 2017-05-11 LAB — COMPREHENSIVE METABOLIC PANEL
ALBUMIN: 4.5 g/dL (ref 3.5–5.0)
ALT: 13 U/L — AB (ref 17–63)
AST: 21 U/L (ref 15–41)
Alkaline Phosphatase: 99 U/L (ref 38–126)
Anion gap: 5 (ref 5–15)
BUN: 16 mg/dL (ref 6–20)
CHLORIDE: 107 mmol/L (ref 101–111)
CO2: 28 mmol/L (ref 22–32)
CREATININE: 1.03 mg/dL (ref 0.61–1.24)
Calcium: 9.7 mg/dL (ref 8.9–10.3)
GFR calc Af Amer: >60 mL/min (ref >60)
GLUCOSE: 91 mg/dL (ref 65–99)
POTASSIUM: 4 mmol/L (ref 3.5–5.1)
Sodium: 140 mmol/L (ref 135–145)
Total Bilirubin: 0.7 mg/dL (ref 0.3–1.2)
Total Protein: 7.9 g/dL (ref 6.5–8.1)

## 2017-05-11 LAB — LIPASE, BLOOD: LIPASE: 32 U/L (ref 11–51)

## 2017-05-11 NOTE — ED Provider Notes (Addendum)
[facility] DEPT Provider Note   CSN: [id number] Arrival date & time: 05/10/17  2152     History   Chief Complaint Chief Complaint  Patient presents with  . Shoulder Pain    left  . Abdominal Pain    HPI Collin White is a 21 y.o. male.  The history is provided by the patient.  Shoulder Pain   This is a new problem. The current episode started less than 1 hour ago. The problem occurs constantly. The problem has not changed since onset.The pain is present in the left shoulder. Quality: cramping. Pertinent negatives include no numbness, full range of motion, no stiffness, no tingling and no itching. There has been no history of extremity trauma (felt the pain after stretching.).  Abdominal Pain   This is a recurrent problem. The current episode started 6 to 12 hours ago. Episode frequency: intermittent. The problem has not changed since onset.Associated with: notices pain more with walking. Pain location: right sided. The quality of the pain is cramping. The pain is moderate. Pertinent negatives include anorexia, fever, diarrhea, hematochezia, melena, nausea, vomiting and constipation. Nothing aggravates the symptoms. Nothing relieves the symptoms.   Also complains of scrotal burning which he notices with walking.  Past Medical History:  Diagnosis Date  . Seizures [name])     Patient Active Problem List   Diagnosis Date Noted  . 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 Psychoses   . Schizophrenia (HCC) 09/29/2015  . Cannabis use disorder, moderate, in early remission (HCC) 09/26/2015  . History of posttraumatic stress disorder (PTSD) 09/26/2015    Past Surgical History:  Procedure Laterality Date  . abd surgery s/p traumatic event    . facial reconstructive surgery    . NO PAST SURGERIES  patient stated he had facial reconstruction surgery as a child   . tumor removed from head          Home Medications    Prior to Admission medications   Medication  Sig Start Date End Date Taking? Authorizing Provider  ibuprofen (ADVIL,MOTRIN) 200 MG tablet Take 400 mg by mouth every 6 (six) hours as needed for moderate pain.   Yes [provider]  benztropine (COGENTIN) 1 MG tablet Take 1 tablet (1 mg total) by mouth 2 (two) times daily. Patient not taking: Reported on 05/05/2017 10/02/15   Adonis Brook, NP  haloperidol (HALDOL) 5 MG tablet Take on tablet (5 mg) in the morning.  Take 2 tabs (10 mg) at bedtime. Patient not taking: Reported on 01/24/2016 10/02/15   Adonis Brook, NP  hydrOXYzine (ATARAX/VISTARIL) 25 MG tablet Take 1 tablet (25 mg total) by mouth 3 (three) times daily as needed for anxiety. Patient not taking: Reported on 01/24/2016 10/02/15   Adonis Brook, NP  Oxcarbazepine (TRILEPTAL) 300 MG tablet Take 1 tablet (300 mg total) by mouth 2 (two) times daily. Patient not taking: Reported on 01/24/2016 10/02/15   Adonis Brook, NP  promethazine (PHENERGAN) 25 MG tablet Take 1 tablet (25 mg total) by mouth every 6 (six) hours as needed for nausea or vomiting. Patient not taking: Reported on 05/11/2017 05/08/17   Antony Madura, PA-C    Family History Family History  Problem Relation Age of Onset  . Hypertension Other   . Mental illness Neg Hx     Social History Social History  Substance Use Topics  . Smoking status: Current Every Day Smoker    Types: Cigarettes  . Smokeless tobacco: Never Used  . Alcohol use No  Allergies   Patient has no known allergies.   Review of Systems Review of Systems  Constitutional: Negative for fever.  Gastrointestinal: Positive for abdominal pain. Negative for anorexia, constipation, diarrhea, hematochezia, melena, nausea and vomiting.  Musculoskeletal: Negative for stiffness.  Skin: Negative for itching.  Neurological: Negative for tingling and numbness.  All other systems are reviewed and are negative for acute change except as noted in the HPI    Physical Exam Updated Vital  Signs BP (!) 125/99   Pulse 65   Temp 97.7 F (36.5 C) (Oral)   Resp 16   SpO2 100%   Physical Exam  Constitutional: He is oriented to person, place, and time. He appears well-developed and well-nourished. No distress.  Disheveled. Malodorous.  HENT:  Head: Normocephalic and atraumatic.  Nose: Nose normal.  Eyes: Pupils are equal, round, and reactive to light. Conjunctivae and EOM are normal. Right eye exhibits no discharge. Left eye exhibits no discharge. No scleral icterus.  Neck: Normal range of motion. Neck supple.  Cardiovascular: Normal rate and regular rhythm.  Exam reveals no gallop and no friction rub.   No murmur heard. Pulmonary/Chest: Effort normal and breath sounds normal. No stridor. No respiratory distress. He has no rales.  Abdominal: Soft. He exhibits no distension. There is tenderness (mild discomfort) in the periumbilical area. There is no rigidity, no rebound, no guarding and negative Murphy's sign. Hernia confirmed negative in the right inguinal area and confirmed negative in the left inguinal area.  Genitourinary: Testes normal. Cremasteric reflex is present. Right testis shows no mass, no swelling and no tenderness. Cremasteric reflex is not absent on the right side. Left testis shows no mass, no swelling and no tenderness. Cremasteric reflex is not absent on the left side. Circumcised. No phimosis, paraphimosis or penile erythema. No discharge found.  Musculoskeletal: He exhibits no edema.       Cervical back: He exhibits tenderness. He exhibits normal range of motion and no bony tenderness.       Back:  Neurological: He is alert and oriented to person, place, and time.  Skin: Skin is warm and dry. No rash noted. He is not diaphoretic. No erythema.  Psychiatric: He has a normal mood and affect.  Vitals reviewed.    ED Treatments / Results  Labs (all labs ordered are listed, but only abnormal results are displayed) Labs Reviewed  COMPREHENSIVE METABOLIC  PANEL - Abnormal; Notable for the following:       Result Value   ALT 13 (*)    All other components within normal limits  CBC WITH DIFFERENTIAL/PLATELET  LIPASE, BLOOD    EKG  EKG Interpretation None       Radiology No results found.  Procedures Procedures (including critical care time)  Medications Ordered in ED Medications - No data to display   Initial Impression / Assessment and Plan / ED Course  I have reviewed the triage vital signs and the nursing notes.  Pertinent labs & imaging results that were available during my care of the patient were reviewed by me and considered in my medical decision making (see chart for details).     1. Left shoulder pain Consistent with muscle strain. No recent trauma suggesting fracture or dislocation. Doubt cardiopulmonary etiology. No imaging required.  2. Recurrent abdominal pain Patient with periumbilical discomfort. No evidence of peritonitis. Labs grossly reassuring without evidence of leukocytosis, pancreatitis, biliary obstruction. Low suspicion for serious intra-abdominal inflammatory/infectious process. No indication for this imaging at this time.  3. Scrotal burning Seems to be cutaneous in nature, likely secondary to poor hygiene. No evidence of torsion, cellulitis, abscess.  The patient is safe for discharge with strict return precautions.   Final Clinical Impressions(s) / ED Diagnoses   Final diagnoses:  Periumbilical abdominal pain  Acute pain of left shoulder  Muscle strain   Disposition: Discharge  Condition: Good  I have discussed the results, Dx and Tx plan with the patient who expressed understanding and agree(s) with the plan. Discharge instructions discussed at great length. The patient was given strict return precautions who verbalized understanding of the instructions. No further questions at time of discharge.    New Prescriptions   No medications on file    Follow Up: primary care  provider  Schedule an appointment as soon as possible for a visit  As needed      Mileigh Tilley, Amadeo Garnet, MD 05/11/17 1103

## 2017-05-11 NOTE — Discharge Instructions (Signed)

## 2017-05-11 NOTE — ED Notes (Signed)
Pt refuses to get up to come to room from waiting room.

## 2017-05-11 NOTE — ED Notes (Signed)
Pt called x1. No answer from lobby,

## 2017-05-11 NOTE — ED Notes (Signed)
Pt states he is out of all his medications

## 2017-05-11 NOTE — ED Triage Notes (Signed)
Pt reports left shoulder pain, abd pain, right arm pain, and burning in his scrotum area. Pt also reports a headache.  Pt has been seen multiple time recently. Pt hasn't been able to obtain his medications.

## 2017-05-21 ENCOUNTER — Emergency Department (HOSPITAL_COMMUNITY)
Admission: EM | Admit: 2017-05-21 | Discharge: 2017-05-21 | Disposition: A | Discharge status: Home / Self Care | Payer: Self-pay | Attending: Emergency Medicine | Admitting: Emergency Medicine

## 2017-05-21 ENCOUNTER — Encounter (HOSPITAL_COMMUNITY): Payer: Self-pay | Admitting: *Deleted

## 2017-05-21 ENCOUNTER — Encounter (HOSPITAL_COMMUNITY): Payer: Self-pay

## 2017-05-21 DIAGNOSIS — Y929 Unspecified place or not applicable: Secondary | ICD-10-CM | POA: Insufficient documentation

## 2017-05-21 DIAGNOSIS — M79605 Pain in left leg: Secondary | ICD-10-CM | POA: Insufficient documentation

## 2017-05-21 DIAGNOSIS — Z79899 Other long term (current) drug therapy: Secondary | ICD-10-CM | POA: Insufficient documentation

## 2017-05-21 DIAGNOSIS — F17210 Nicotine dependence, cigarettes, uncomplicated: Secondary | ICD-10-CM | POA: Insufficient documentation

## 2017-05-21 DIAGNOSIS — S39012A Strain of muscle, fascia and tendon of lower back, initial encounter: Secondary | ICD-10-CM | POA: Insufficient documentation

## 2017-05-21 DIAGNOSIS — M25511 Pain in right shoulder: Secondary | ICD-10-CM | POA: Insufficient documentation

## 2017-05-21 DIAGNOSIS — Y999 Unspecified external cause status: Secondary | ICD-10-CM | POA: Insufficient documentation

## 2017-05-21 DIAGNOSIS — M542 Cervicalgia: Secondary | ICD-10-CM | POA: Insufficient documentation

## 2017-05-21 DIAGNOSIS — Z590 Homelessness: Secondary | ICD-10-CM | POA: Insufficient documentation

## 2017-05-21 DIAGNOSIS — Y939 Activity, unspecified: Secondary | ICD-10-CM | POA: Insufficient documentation

## 2017-05-21 DIAGNOSIS — W1840XA Slipping, tripping and stumbling without falling, unspecified, initial encounter: Secondary | ICD-10-CM | POA: Insufficient documentation

## 2017-05-21 DIAGNOSIS — M25519 Pain in unspecified shoulder: Secondary | ICD-10-CM

## 2017-05-21 NOTE — ED Triage Notes (Signed)
Left leg pain and right foot burning x 1 day. Denies injury. Ambulatory.

## 2017-05-21 NOTE — ED Triage Notes (Signed)
Pt presents with c/o neck pain, reports that his neck is broken, denies any recent injuries since an injury in July. Pt also c/o right arm pain, has a brace on that arm at this time.

## 2017-05-21 NOTE — ED Provider Notes (Signed)
[facility] DEPT Provider Note   CSN: [id number] Arrival date & time: 05/21/17  0212     History   Chief Complaint Chief Complaint  Patient presents with  . Neck Pain  . Arm Pain    HPI Papa Piercefield is a 21 y.o. male.  HPI Patient with history of seizures, fracture of the hand, right side comes in with chief complaint of neck pain and arm pain. Patient reports that both his neck and arm are broken back in the summer time any headache gets intermittent pain to his right side. Patient has no new numbness, tingling, weakness. Patient has been applying a wrist brace to his right upper extremity and is wondering if he can get a sling for his persistent pain. Patient is taking no medications for his pain.  Past Medical History:  Diagnosis Date  . Seizures [facility])     Patient Active Problem List   Diagnosis Date Noted  . 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 Psychoses (HCC)   . Schizophrenia (HCC) 09/29/2015  . Cannabis use disorder, moderate, in early remission (HCC) 09/26/2015  . History of posttraumatic stress disorder (PTSD) 09/26/2015    Past Surgical History:  Procedure Laterality Date  . abd surgery s/p traumatic event    . facial reconstructive surgery    . NO PAST SURGERIES  patient stated he had facial reconstruction surgery as a child   . tumor removed from head          Home Medications    Prior to Admission medications   Medication Sig Start Date End Date Taking? Authorizing Provider  benztropine (COGENTIN) 1 MG tablet Take 1 tablet (1 mg total) by mouth 2 (two) times daily. Patient not taking: Reported on 05/05/2017 10/02/15   Adonis Brook, NP  haloperidol (HALDOL) 5 MG tablet Take on tablet (5 mg) in the morning.  Take 2 tabs (10 mg) at bedtime. Patient not taking: Reported on 01/24/2016 10/02/15   Adonis Brook, NP  hydrOXYzine (ATARAX/VISTARIL) 25 MG tablet Take 1 tablet (25 mg total) by mouth 3 (three) times daily as needed for  anxiety. Patient not taking: Reported on 01/24/2016 10/02/15   Adonis Brook, NP  ibuprofen (ADVIL,MOTRIN) 200 MG tablet Take 400 mg by mouth every 6 (six) hours as needed for moderate pain.    [provider]  Oxcarbazepine (TRILEPTAL) 300 MG tablet Take 1 tablet (300 mg total) by mouth 2 (two) times daily. Patient not taking: Reported on 01/24/2016 10/02/15   Adonis Brook, NP  promethazine (PHENERGAN) 25 MG tablet Take 1 tablet (25 mg total) by mouth every 6 (six) hours as needed for nausea or vomiting. Patient not taking: Reported on 05/11/2017 05/08/17   Antony Madura, PA-C    Family History Family History  Problem Relation Age of Onset  . Hypertension Other   . Mental illness Neg Hx     Social History Social History  Substance Use Topics  . Smoking status: Current Every Day Smoker    Types: Cigarettes  . Smokeless tobacco: Never Used  . Alcohol use No     Allergies   Patient has no known allergies.   Review of Systems Review of Systems  Constitutional: Negative for activity change.  Musculoskeletal: Positive for arthralgias and myalgias.     Physical Exam Updated Vital Signs BP 118/78   Pulse 76   Temp 98.1 F (36.7 C) (Oral)   Resp 18   SpO2 99%   Physical Exam  Constitutional: He is oriented to  person, place, and time. He appears well-developed.  HENT:  Head: Atraumatic.  Neck: Neck supple.  No midline c-spine tenderness, pt able to turn head to 45 degrees bilaterally without any pain and able to flex neck to the chest and extend without any pain or neurologic symptoms.   Cardiovascular: Normal rate.   Pulmonary/Chest: Effort normal.  Musculoskeletal: He exhibits no edema or tenderness.  Neurological: He is alert and oriented to person, place, and time.  Skin: Skin is warm.  Nursing note and vitals reviewed.    ED Treatments / Results  Labs (all labs ordered are listed, but only abnormal results are displayed) Labs Reviewed - No data to  display  EKG  EKG Interpretation None       Radiology No results found.  Procedures Procedures (including critical care time)  Medications Ordered in ED Medications - No data to display   Initial Impression / Assessment and Plan / ED Course  I have reviewed the triage vital signs and the nursing notes.  Pertinent labs & imaging results that were available during my care of the patient were reviewed by me and considered in my medical decision making (see chart for details).     Patient comes in with nonspecific neck and right arm pain. He is history of fracture of the right hamate. I see no indication for any acute imaging given there is no evidence of edema, deformity, neurologic symptoms.This is patient's 19th visit in the last 6 months for mostly right upper extremity complaint, and previous x-rays have been reviewed.  Final Clinical Impressions(s) / ED Diagnoses   Final diagnoses:  Shoulder pain, unspecified chronicity, unspecified laterality    New Prescriptions Discharge Medication List as of 05/21/2017  5:50 AM       Derwood Kaplan, MD 05/21/17 [id number]

## 2017-05-22 ENCOUNTER — Emergency Department (HOSPITAL_COMMUNITY)
Admission: EM | Admit: 2017-05-22 | Discharge: 2017-05-22 | Disposition: A | Discharge status: Home / Self Care | Payer: Self-pay | Attending: Emergency Medicine | Admitting: Emergency Medicine

## 2017-05-22 DIAGNOSIS — S39012A Strain of muscle, fascia and tendon of lower back, initial encounter: Secondary | ICD-10-CM

## 2017-05-22 MED ORDER — IBUPROFEN 200 MG PO TABS
600.0000 mg | ORAL_TABLET | Freq: Once | ORAL | Status: AC
  Administered 2017-05-22: 600 mg via ORAL
  Filled 2017-05-22: qty 3

## 2017-05-22 NOTE — ED Notes (Signed)
Pt into shower. 

## 2017-05-22 NOTE — ED Notes (Signed)
Pt remains in shower.

## 2017-05-22 NOTE — ED Notes (Signed)
Pt denied needing meds, stated he lived in a boarding house, would walk home after d/c'd because his house was close to the hospital, and his doctor told him he didn't need to take any meds.

## 2017-05-22 NOTE — ED Notes (Signed)
Pt stated "I'm just waiting to get my luxury car then I'm going to be with my family in [place].  I can't have no insurance because I have disability but it's not like disability like everybody else.  It's called DisAbility they put money on a card for someone that has a major disorder, only to age 21, like mainly bone disorders, they can run play, like whatever, they're going to have so many different forms of problems, with this bone here and this bone there that will go away after age 33."  Pt with flight of ideas, tangential.

## 2017-05-22 NOTE — ED Notes (Signed)
Pt stated "I can't have those clothes.  I only wear women's clothes.  I wear women's panties.  Don't you see what's in my bag?"  Pt refused clothes provided by previous RN.

## 2017-05-22 NOTE — ED Notes (Signed)
Patient is not saying clear sentences. Patient has not eaten in several days. Patient has not had his medications in several days. Patient is soiled.

## 2017-05-22 NOTE — ED Provider Notes (Signed)
[facility] Provider Note   CSN: [id number] Arrival date & time: 05/21/17  2217     History   Chief Complaint Chief Complaint  Patient presents with  . Leg Pain  . Medical Clearance    HPI Collin White is a 21 y.o. male.  HPI  21 year old male with a history of schizophrenia, homelessness presents with left-sided back and leg pain. States he was walking less than 24 hours ago and his left leg slipped and stretched out laterally. Since then his been having back pain and pain rating down his leg. He has a hard time walking. He states this happens to him often whenever he injures his back. No weakness/numbness. He has not taken anything for the pain. He and related into the ER. While he was being assessed by the nurse, she thought he was mumbling and was concerned about a psychiatric illness and ordered psychiatric workup. The patient tells me he has not had any hallucinations, suicidal thoughts, or homicidal thoughts. He is not taking his medicines but states he was taken off of this by his psychiatrist.  Past Medical History:  Diagnosis Date  . Seizures [facility])     Patient Active Problem List   Diagnosis Date Noted  . 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 Psychoses (HCC)   . Schizophrenia (HCC) 09/29/2015  . Cannabis use disorder, moderate, in early remission (HCC) 09/26/2015  . History of posttraumatic stress disorder (PTSD) 09/26/2015    Past Surgical History:  Procedure Laterality Date  . abd surgery s/p traumatic event    . facial reconstructive surgery    . NO PAST SURGERIES  patient stated he had facial reconstruction surgery as a child   . tumor removed from head          Home Medications    Prior to Admission medications   Medication Sig Start Date End Date Taking? Authorizing Provider  ibuprofen (ADVIL,MOTRIN) 200 MG tablet Take 400 mg by mouth every 6 (six) hours as needed for moderate pain.   Yes [provider]  benztropine  (COGENTIN) 1 MG tablet Take 1 tablet (1 mg total) by mouth 2 (two) times daily. Patient not taking: Reported on 05/05/2017 10/02/15   Adonis Brook, NP  haloperidol (HALDOL) 5 MG tablet Take on tablet (5 mg) in the morning.  Take 2 tabs (10 mg) at bedtime. Patient not taking: Reported on 01/24/2016 10/02/15   Adonis Brook, NP  hydrOXYzine (ATARAX/VISTARIL) 25 MG tablet Take 1 tablet (25 mg total) by mouth 3 (three) times daily as needed for anxiety. Patient not taking: Reported on 01/24/2016 10/02/15   Adonis Brook, NP  Oxcarbazepine (TRILEPTAL) 300 MG tablet Take 1 tablet (300 mg total) by mouth 2 (two) times daily. Patient not taking: Reported on 01/24/2016 10/02/15   Adonis Brook, NP  promethazine (PHENERGAN) 25 MG tablet Take 1 tablet (25 mg total) by mouth every 6 (six) hours as needed for nausea or vomiting. Patient not taking: Reported on 05/11/2017 05/08/17   Antony Madura, PA-C    Family History Family History  Problem Relation Age of Onset  . Hypertension Other   . Mental illness Neg Hx     Social History Social History  Substance Use Topics  . Smoking status: Current Every Day Smoker    Types: Cigarettes  . Smokeless tobacco: Never Used  . Alcohol use No     Allergies   Patient has no known allergies.   Review of Systems Review of Systems  Musculoskeletal: Positive  for back pain.  Neurological: Negative for weakness and numbness.  Psychiatric/Behavioral: Negative for hallucinations, self-injury and suicidal ideas.  All other systems reviewed and are negative.    Physical Exam Updated Vital Signs BP 131/85 (BP Location: Left Arm)   Pulse 63   Temp 98.2 F (36.8 C) (Oral)   Resp 18   Ht 6' (1.829 m)   Wt 66.2 kg (146 lb)   SpO2 100%   BMI 19.80 kg/m   Physical Exam  Constitutional: He is oriented to person, place, and time. He appears well-developed and well-nourished.  HENT:  Head: Normocephalic and atraumatic.  Right Ear: External ear normal.  Left  Ear: External ear normal.  Nose: Nose normal.  Eyes: Right eye exhibits no discharge. Left eye exhibits no discharge.  Neck: Neck supple.  Cardiovascular: Normal rate and regular rhythm.   Pulses:      Dorsalis pedis pulses are 2+ on the left side.  Pulmonary/Chest: Effort normal.  Abdominal: Soft. There is no tenderness.  Musculoskeletal: He exhibits no edema.       Left hip: He exhibits normal range of motion and no tenderness.       Left knee: He exhibits normal range of motion. No tenderness found.       Left ankle: He exhibits normal range of motion. No tenderness.       Lumbar back: He exhibits tenderness (left lateral). He exhibits no bony tenderness.       Left upper leg: He exhibits no tenderness.       Left lower leg: He exhibits no tenderness.       Left foot: There is normal range of motion and no tenderness.  Neurological: He is alert and oriented to person, place, and time.  5/5 strength in BLE. Normal gross sensation. Ambulates with slight left sided limp.  Skin: Skin is warm and dry.  Psychiatric: His speech is not rapid and/or pressured and not slurred. He is not agitated and not actively hallucinating. He expresses no homicidal and no suicidal ideation. He is attentive.  Nursing note and vitals reviewed.    ED Treatments / Results  Labs (all labs ordered are listed, but only abnormal results are displayed) Labs Reviewed - No data to display  EKG  EKG Interpretation None       Radiology No results found.  Procedures Procedures (including critical care time)  Medications Ordered in ED Medications  ibuprofen (ADVIL,MOTRIN) tablet 600 mg (not administered)     Initial Impression / Assessment and Plan / ED Course  I have reviewed the triage vital signs and the nursing notes.  Pertinent labs & imaging results that were available during my care of the patient were reviewed by me and considered in my medical decision making (see chart for details).      Patient's presentation is c/w acute muscle strain. No acute neuro deficits or focal leg trauma/swelling. Able to ambulate. Treat with nsaids, heat. Nursing staff ordered SW consult based on him being homeless but he states he's currently staying in a shelter. He has chronic psychiatric disease but when I talk to him he answers questions appropriately and does not appear acutely psychotic. No SI/HI. No distress. D/c home, return precautions.  Final Clinical Impressions(s) / ED Diagnoses   Final diagnoses:  Back strain, initial encounter    New Prescriptions New Prescriptions   No medications on file     Pricilla Loveless, MD 05/22/17 [phone]

## 2017-05-22 NOTE — ED Notes (Signed)
Bed: [facility] Expected date:  Expected time:  Means of arrival:  Comments: 

## 2017-05-27 ENCOUNTER — Encounter (HOSPITAL_COMMUNITY): Payer: Self-pay | Admitting: Emergency Medicine

## 2017-05-27 ENCOUNTER — Emergency Department (HOSPITAL_COMMUNITY): Admission: EM | Admit: 2017-05-27 | Discharge: 2017-05-27 | Discharge status: Against Medical Advice | Payer: Self-pay

## 2017-05-27 ENCOUNTER — Emergency Department (HOSPITAL_COMMUNITY)
Admission: EM | Admit: 2017-05-27 | Discharge: 2017-05-27 | Disposition: A | Discharge status: Home / Self Care | Payer: Self-pay | Attending: Emergency Medicine | Admitting: Emergency Medicine

## 2017-05-27 DIAGNOSIS — Y999 Unspecified external cause status: Secondary | ICD-10-CM | POA: Insufficient documentation

## 2017-05-27 DIAGNOSIS — Y929 Unspecified place or not applicable: Secondary | ICD-10-CM | POA: Insufficient documentation

## 2017-05-27 DIAGNOSIS — X58XXXA Exposure to other specified factors, initial encounter: Secondary | ICD-10-CM | POA: Insufficient documentation

## 2017-05-27 DIAGNOSIS — S90421A Blister (nonthermal), right great toe, initial encounter: Secondary | ICD-10-CM | POA: Insufficient documentation

## 2017-05-27 DIAGNOSIS — Y939 Activity, unspecified: Secondary | ICD-10-CM | POA: Insufficient documentation

## 2017-05-27 DIAGNOSIS — S90821A Blister (nonthermal), right foot, initial encounter: Secondary | ICD-10-CM

## 2017-05-27 DIAGNOSIS — F17210 Nicotine dependence, cigarettes, uncomplicated: Secondary | ICD-10-CM | POA: Insufficient documentation

## 2017-05-27 NOTE — Discharge Instructions (Signed)
To find a primary care or specialty doctor please call [phone] or [phone] to access "[facility] Find a Doctor Service." ° °You may also go on the [facility] website at [email or web address] ° °There are also multiple [facility], [facility] practices throughout [place] that are frequently accepting new patients. You may find a clinic that is close to your home and contact them. ° °[facility] -  °[address] °[place] [address] °[phone] ° ° °[facility] -  °[address] °[place] [address] [phone] ° ° °[facility] - °[address]  °[place] [address] °[phone] ° ° °

## 2017-05-27 NOTE — ED Notes (Signed)
Bed: [facility] Expected date:  Expected time:  Means of arrival:  Comments: 

## 2017-05-27 NOTE — ED Notes (Signed)
Patient called x 3 and no answer. 

## 2017-05-27 NOTE — ED Notes (Signed)
Bed: W[facility]6 Expected date:  Expected time:  Means of arrival:  Comments: 

## 2017-05-27 NOTE — ED Provider Notes (Signed)
TIME SEEN: 5:59 AM  CHIEF COMPLAINT: right foot pain  HPI: Patient is a 21 year old male with history of seizures who presents emergency department with a blister to the top of his right foot. He states it hurts when he walks because of this blister. No redness or warmth. No drainage. No fever.  Patient has a bizarre affect. He denies psychiatric history. He denies drug or alcohol use. He denies SI, HI, hallucinations.  ROS: See HPI Constitutional: no fever  Eyes: no drainage  ENT: no runny nose   Cardiovascular:  no chest pain  Resp: no SOB  GI: no vomiting GU: no dysuria Integumentary: no rash  Allergy: no hives  Musculoskeletal: no leg swelling  Neurological: no slurred speech ROS otherwise negative  PAST MEDICAL HISTORY/PAST SURGICAL HISTORY:  Past Medical History:  Diagnosis Date  . Seizures (HCC)     MEDICATIONS:  Prior to Admission medications   Medication Sig Start Date End Date Taking? Authorizing Provider  ibuprofen (ADVIL,MOTRIN) 200 MG tablet Take 400 mg by mouth every 6 (six) hours as needed for moderate pain.   Yes [provider]  benztropine (COGENTIN) 1 MG tablet Take 1 tablet (1 mg total) by mouth 2 (two) times daily. Patient not taking: Reported on 05/05/2017 10/02/15   Adonis Brook, NP  haloperidol (HALDOL) 5 MG tablet Take on tablet (5 mg) in the morning.  Take 2 tabs (10 mg) at bedtime. Patient not taking: Reported on 01/24/2016 10/02/15   Adonis Brook, NP  hydrOXYzine (ATARAX/VISTARIL) 25 MG tablet Take 1 tablet (25 mg total) by mouth 3 (three) times daily as needed for anxiety. Patient not taking: Reported on 01/24/2016 10/02/15   Adonis Brook, NP  Oxcarbazepine (TRILEPTAL) 300 MG tablet Take 1 tablet (300 mg total) by mouth 2 (two) times daily. Patient not taking: Reported on 01/24/2016 10/02/15   Adonis Brook, NP    ALLERGIES:  No Known Allergies  SOCIAL HISTORY:  Social History  Substance Use Topics  . Smoking status: Current Every  Day Smoker    Types: Cigarettes  . Smokeless tobacco: Never Used  . Alcohol use No    FAMILY HISTORY: Family History  Problem Relation Age of Onset  . Hypertension Other   . Mental illness Neg Hx     EXAM: BP 128/90 (BP Location: Right Arm)   Pulse 65   Temp 97.7 F (36.5 C) (Oral)   Resp 18   Ht 6' (1.829 m)   Wt 66.2 kg (146 lb)   SpO2 100%   BMI 19.80 kg/m  CONSTITUTIONAL: Alert and oriented and responds appropriately to questions. Well-appearing; well-nourished HEAD: Normocephalic EYES: Conjunctivae clear, pupils appear equal, EOMI ENT: normal nose; moist mucous membranes NECK: Supple, no meningismus, no nuchal rigidity, no LAD  CARD: RRR; S1 and S2 appreciated; no murmurs, no clicks, no rubs, no gallops RESP: Normal chest excursion without splinting or tachypnea; breath sounds clear and equal bilaterally; no wheezes, no rhonchi, no rales, no hypoxia or respiratory distress, speaking full sentences ABD/GI: Normal bowel sounds; non-distended; soft, non-tender, no rebound, no guarding, no peritoneal signs, no hepatosplenomegaly BACK:  The back appears normal and is non-tender to palpation, there is no CVA tenderness EXT: Normal ROM in all joints; non-tender to palpation; no edema; normal capillary refill; no cyanosis, no calf tenderness or swelling, no bony tenderness or bony deformity, 2+ DP pulses bilaterally    SKIN: Normal color for age and race; warm; no rash; patient has a 1 x 1 cm superficial  blister to the top of the right foot without drainage, surrounding erythema or warmth NEURO: Moves all extremities equally, patient able to ambulate without difficulty PSYCH: bizarre affect. Poor grooming. No SI, HI or hallucinations. Does not appear intoxicated.  MEDICAL DECISION MAKING: Pt has a blister to his right foot. No sign of superimposed infection. He is able to ambulate. No sign of bony injury. Suspect is because of the flip-flops that he is wearing. Have placed a  bandage over this blister. No other rash or lesions on exam. He has bizarre affect but no current psychiatric safety concerns. I feel he is safe to be discharged without further emergent workup. Given list of outpatient primary care providers as patient has been here 22 times the past 6 months for various complaints.  At this time, I do not feel there is any life-threatening condition present. I have reviewed and discussed all results (EKG, imaging, lab, urine as appropriate) and exam findings with patient/family. I have reviewed nursing notes and appropriate previous records.  I feel the patient is safe to be discharged home without further emergent workup and can continue workup as an outpatient as needed. Discussed usual and customary return precautions. Patient/family verbalize understanding and are comfortable with this plan.  Outpatient follow-up has been provided if needed. All questions have been answered.      Lichelle Viets, Layla Maw, DO 05/27/17 [phone]

## 2017-05-27 NOTE — ED Notes (Signed)
No answer when called for triage 

## 2017-05-27 NOTE — ED Triage Notes (Signed)
Patient complaining of right foot pain from a blister that is on his right big toe. Patient states it is making it painful for him to walk.

## 2017-05-29 ENCOUNTER — Emergency Department (HOSPITAL_COMMUNITY)
Admission: EM | Admit: 2017-05-29 | Discharge: 2017-05-29 | Disposition: A | Discharge status: Home / Self Care | Payer: Self-pay | Attending: Emergency Medicine | Admitting: Emergency Medicine

## 2017-05-29 ENCOUNTER — Encounter (HOSPITAL_COMMUNITY): Payer: Self-pay

## 2017-05-29 DIAGNOSIS — F17210 Nicotine dependence, cigarettes, uncomplicated: Secondary | ICD-10-CM | POA: Insufficient documentation

## 2017-05-29 DIAGNOSIS — Y929 Unspecified place or not applicable: Secondary | ICD-10-CM | POA: Insufficient documentation

## 2017-05-29 DIAGNOSIS — S90821A Blister (nonthermal), right foot, initial encounter: Secondary | ICD-10-CM | POA: Insufficient documentation

## 2017-05-29 DIAGNOSIS — Y999 Unspecified external cause status: Secondary | ICD-10-CM | POA: Insufficient documentation

## 2017-05-29 DIAGNOSIS — X58XXXA Exposure to other specified factors, initial encounter: Secondary | ICD-10-CM | POA: Insufficient documentation

## 2017-05-29 DIAGNOSIS — Y939 Activity, unspecified: Secondary | ICD-10-CM | POA: Insufficient documentation

## 2017-05-29 MED ORDER — BACITRACIN ZINC 500 UNIT/GM EX OINT: 1.0000 "application " | TOPICAL_OINTMENT | Freq: Two times a day (BID) | CUTANEOUS | 0 refills | Status: DC

## 2017-05-29 MED ORDER — BACITRACIN ZINC 500 UNIT/GM EX OINT
TOPICAL_OINTMENT | CUTANEOUS | Status: AC
  Filled 2017-05-29: qty 0.9

## 2017-05-29 NOTE — ED Triage Notes (Addendum)
Pt complains of right foot pain due to a blister and swelling on his big toe and he states that the pain is radiating to his groin

## 2017-05-29 NOTE — Discharge Instructions (Signed)
Apply bacitracin to your blister and keep it covered to prevent infection. Try to avoid the use of flip flops as this may be worsening your blister. Take tylenol or ibuprofen for pain. Follow up with a primary care doctor

## 2017-05-29 NOTE — ED Provider Notes (Signed)
[facility]-EMERGENCY DEPT Provider Note   CSN: [id number] Arrival date & time: 05/29/17  9562     History   Chief Complaint Chief Complaint  Patient presents with  . Foot Pain    HPI Collin White is a 21 y.o. male.  21 year old male presents to the emergency department for a persistent blister at the base of the dorsal aspect of his right great toe. He was seen for this 48 hours prior. Patient well known to the ED with 23 visits in the past 6 months. He states that his prior bandage fell off. He denies any known fevers.    The history is provided by the patient. No language interpreter was used.  Foot Pain  This is a new problem. The current episode started 2 days ago. The problem occurs constantly. The problem has not changed since onset.The symptoms are aggravated by walking. Nothing relieves the symptoms. He has tried rest for the symptoms. The treatment provided no relief.    Past Medical History:  Diagnosis Date  . Seizures [facility])     Patient Active Problem List   Diagnosis Date Noted  . 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 Psychoses (HCC)   . Schizophrenia (HCC) 09/29/2015  . Cannabis use disorder, moderate, in early remission (HCC) 09/26/2015  . History of posttraumatic stress disorder (PTSD) 09/26/2015    Past Surgical History:  Procedure Laterality Date  . abd surgery s/p traumatic event    . facial reconstructive surgery    . NO PAST SURGERIES  patient stated he had facial reconstruction surgery as a child   . tumor removed from head          Home Medications    Prior to Admission medications   Medication Sig Start Date End Date Taking? Authorizing Provider  bacitracin ointment Apply 1 application topically 2 (two) times daily. 05/29/17   Antony Madura, PA-C  benztropine (COGENTIN) 1 MG tablet Take 1 tablet (1 mg total) by mouth 2 (two) times daily. Patient not taking: Reported on 05/05/2017 10/02/15   Adonis Brook, NP  haloperidol (HALDOL)  5 MG tablet Take on tablet (5 mg) in the morning.  Take 2 tabs (10 mg) at bedtime. Patient not taking: Reported on 01/24/2016 10/02/15   Adonis Brook, NP  hydrOXYzine (ATARAX/VISTARIL) 25 MG tablet Take 1 tablet (25 mg total) by mouth 3 (three) times daily as needed for anxiety. Patient not taking: Reported on 01/24/2016 10/02/15   Adonis Brook, NP  ibuprofen (ADVIL,MOTRIN) 200 MG tablet Take 400 mg by mouth every 6 (six) hours as needed for moderate pain.    [provider]  Oxcarbazepine (TRILEPTAL) 300 MG tablet Take 1 tablet (300 mg total) by mouth 2 (two) times daily. Patient not taking: Reported on 01/24/2016 10/02/15   Adonis Brook, NP    Family History Family History  Problem Relation Age of Onset  . Hypertension Other   . Mental illness Neg Hx     Social History Social History  Substance Use Topics  . Smoking status: Current Every Day Smoker    Types: Cigarettes  . Smokeless tobacco: Never Used  . Alcohol use No     Allergies   Patient has no known allergies.   Review of Systems Review of Systems Ten systems reviewed and are negative for acute change, except as noted in the HPI.    Physical Exam Updated Vital Signs BP (!) 106/91 (BP Location: Left Arm)   Pulse 75   Temp 97.6 F (36.4 C) (  Oral)   Resp 20   SpO2 100%   Physical Exam  Constitutional: He is oriented to person, place, and time. He appears well-developed and well-nourished. No distress.  Disheveled. Tattered clothing.  HENT:  Head: Normocephalic and atraumatic.  Eyes: Conjunctivae and EOM are normal. No scleral icterus.  Neck: Normal range of motion.  Cardiovascular: Normal rate, regular rhythm and intact distal pulses.   DP pulse 2+ in the RLE  Pulmonary/Chest: Effort normal. No respiratory distress.  Respirations even and unlabored  Musculoskeletal: Normal range of motion.  Neurological: He is alert and oriented to person, place, and time.  Skin: Skin is warm and dry. No rash  noted. He is not diaphoretic. No pallor.  Open blister to the base of the right great toe. Mild serous drainage. No purulence or surrounding induration or heat to touch.  Psychiatric: He has a normal mood and affect. He is slowed.  Calm and cooperative  Nursing note and vitals reviewed.    ED Treatments / Results  Labs (all labs ordered are listed, but only abnormal results are displayed) Labs Reviewed - No data to display  EKG  EKG Interpretation None       Radiology No results found.  Procedures Procedures (including critical care time)  Medications Ordered in ED Medications  bacitracin 500 UNIT/GM ointment (not administered)     Initial Impression / Assessment and Plan / ED Course  I have reviewed the triage vital signs and the nursing notes.  Pertinent labs & imaging results that were available during my care of the patient were reviewed by me and considered in my medical decision making (see chart for details).     21 year old male presents to the emergency department for a persistent blister to his right foot. Wound care completed in the ED. Wound dressed after application of bacitracin. No physical exam findings to suggest secondary infection. Will prescribe bacitracin for outpatient use. Patient given information on local shelters. Return precautions provided. Patient discharged in stable condition with no unaddressed concerns.   Final Clinical Impressions(s) / ED Diagnoses   Final diagnoses:  Blister of right foot, initial encounter    New Prescriptions Discharge Medication List as of 05/29/2017  5:03 AM    START taking these medications   Details  bacitracin ointment Apply 1 application topically 2 (two) times daily., Starting Fri 05/29/2017, Print         Antony Madura, PA-C 05/29/17 1610    Tilden Fossa, MD 06/03/17 [phone]

## 2017-05-31 ENCOUNTER — Encounter (HOSPITAL_COMMUNITY): Payer: Self-pay | Admitting: Emergency Medicine

## 2017-05-31 ENCOUNTER — Emergency Department (HOSPITAL_COMMUNITY)
Admission: EM | Admit: 2017-05-31 | Discharge: 2017-05-31 | Disposition: A | Discharge status: Home / Self Care | Payer: Self-pay | Attending: Emergency Medicine | Admitting: Emergency Medicine

## 2017-05-31 DIAGNOSIS — X58XXXD Exposure to other specified factors, subsequent encounter: Secondary | ICD-10-CM | POA: Insufficient documentation

## 2017-05-31 DIAGNOSIS — S90821D Blister (nonthermal), right foot, subsequent encounter: Secondary | ICD-10-CM | POA: Insufficient documentation

## 2017-05-31 DIAGNOSIS — Z4800 Encounter for change or removal of nonsurgical wound dressing: Secondary | ICD-10-CM | POA: Insufficient documentation

## 2017-05-31 NOTE — ED Notes (Signed)
Pt called  No response from lobby  

## 2017-05-31 NOTE — ED Triage Notes (Signed)
Pt states he has a blister on his right foot  Pt states he was told to return if the blister changed color  States it is now brown  Pt states it is more painful

## 2017-05-31 NOTE — ED Provider Notes (Signed)
[facility]-EMERGENCY DEPT Provider Note   CSN: [id number] Arrival date & time: 05/31/17  0136     History   Chief Complaint Chief Complaint  Patient presents with  . Blister    HPI Collin White is a 21 y.o. male.   HPI   21 year old male presenting for reevaluation of blister to his right foot. Multiple ED visits including 2 days ago for the same complaint. Reports of the blister popped and is oozing. Still has the same bandage on from his last ED visit.  Past Medical History:  Diagnosis Date  . Seizures [facility])     Patient Active Problem List   Diagnosis Date Noted  . 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 Psychoses (HCC)   . Schizophrenia (HCC) 09/29/2015  . Cannabis use disorder, moderate, in early remission (HCC) 09/26/2015  . History of posttraumatic stress disorder (PTSD) 09/26/2015    Past Surgical History:  Procedure Laterality Date  . abd surgery s/p traumatic event    . facial reconstructive surgery    . NO PAST SURGERIES  patient stated he had facial reconstruction surgery as a child   . tumor removed from head          Home Medications    Prior to Admission medications   Medication Sig Start Date End Date Taking? Authorizing Provider  bacitracin ointment Apply 1 application topically 2 (two) times daily. 05/29/17   Antony Madura, PA-C  benztropine (COGENTIN) 1 MG tablet Take 1 tablet (1 mg total) by mouth 2 (two) times daily. Patient not taking: Reported on 05/05/2017 10/02/15   Adonis Brook, NP  haloperidol (HALDOL) 5 MG tablet Take on tablet (5 mg) in the morning.  Take 2 tabs (10 mg) at bedtime. Patient not taking: Reported on 01/24/2016 10/02/15   Adonis Brook, NP  hydrOXYzine (ATARAX/VISTARIL) 25 MG tablet Take 1 tablet (25 mg total) by mouth 3 (three) times daily as needed for anxiety. Patient not taking: Reported on 01/24/2016 10/02/15   Adonis Brook, NP  ibuprofen (ADVIL,MOTRIN) 200 MG tablet Take 400 mg by mouth every 6 (six) hours  as needed for moderate pain.    [provider]  Oxcarbazepine (TRILEPTAL) 300 MG tablet Take 1 tablet (300 mg total) by mouth 2 (two) times daily. Patient not taking: Reported on 01/24/2016 10/02/15   Adonis Brook, NP    Family History Family History  Problem Relation Age of Onset  . Hypertension Other   . Mental illness Neg Hx     Social History Social History  Substance Use Topics  . Smoking status: Current Every Day Smoker    Types: Cigarettes  . Smokeless tobacco: Never Used  . Alcohol use No     Allergies   Patient has no known allergies.   Review of Systems Review of Systems  All systems reviewed and negative, other than as noted in HPI.  Physical Exam Updated Vital Signs BP 123/87 (BP Location: Left Arm)   Pulse 83   Temp 97.7 F (36.5 C) (Oral)   Resp 16   SpO2 98%   Physical Exam  Constitutional: He appears well-developed and well-nourished. No distress.  HENT:  Head: Normocephalic and atraumatic.  Eyes: Conjunctivae are normal. Right eye exhibits no discharge. Left eye exhibits no discharge.  Neck: Neck supple.  Cardiovascular: Normal rate, regular rhythm and normal heart sounds.  Exam reveals no gallop and no friction rub.   No murmur heard. Pulmonary/Chest: Effort normal and breath sounds normal. No respiratory distress.  Abdominal: Soft.  He exhibits no distension. There is no tenderness.  Musculoskeletal: He exhibits no edema or tenderness.  Ruptured blister over the dorsum of the right foot in the first MTP joint. No cement drainage. No erythema. Mild tenderness. Able to actively range the first toe without any apparent difficulty.  Neurological: He is alert.  Skin: Skin is warm and dry.  Psychiatric: He has a normal mood and affect. His behavior is normal. Thought content normal.  Nursing note and vitals reviewed.    ED Treatments / Results  Labs (all labs ordered are listed, but only abnormal results are displayed) Labs Reviewed  - No data to display  EKG  EKG Interpretation None       Radiology No results found.  Procedures Procedures (including critical care time)  Medications Ordered in ED Medications - No data to display   Initial Impression / Assessment and Plan / ED Course  I have reviewed the triage vital signs and the nursing notes.  Pertinent labs & imaging results that were available during my care of the patient were reviewed by me and considered in my medical decision making (see chart for details).     22yM with ruptured blister dorsum of R foot. Still has bandage on from previous visit. No drainage, swelling, erythema, etc to suggest infected. Clean bandage applied. Instructed pt on how to change dressing and to do it daily or if gets wet. Additional supplies provided. General wound care and return precautions discussed.   Final Clinical Impressions(s) / ED Diagnoses   Final diagnoses:  Blister of right foot, subsequent encounter    New Prescriptions New Prescriptions   No medications on file     Raeford Razor, MD 06/03/17 1130

## 2017-06-02 ENCOUNTER — Emergency Department (HOSPITAL_COMMUNITY)
Admission: EM | Admit: 2017-06-02 | Discharge: 2017-06-02 | Disposition: A | Discharge status: Home / Self Care | Payer: Self-pay | Attending: Emergency Medicine | Admitting: Emergency Medicine

## 2017-06-02 ENCOUNTER — Encounter (HOSPITAL_COMMUNITY): Payer: Self-pay | Admitting: Emergency Medicine

## 2017-06-02 DIAGNOSIS — M79673 Pain in unspecified foot: Secondary | ICD-10-CM | POA: Insufficient documentation

## 2017-06-02 DIAGNOSIS — Z79899 Other long term (current) drug therapy: Secondary | ICD-10-CM | POA: Insufficient documentation

## 2017-06-02 DIAGNOSIS — G8929 Other chronic pain: Secondary | ICD-10-CM

## 2017-06-02 DIAGNOSIS — F17210 Nicotine dependence, cigarettes, uncomplicated: Secondary | ICD-10-CM | POA: Insufficient documentation

## 2017-06-02 DIAGNOSIS — R4183 Borderline intellectual functioning: Secondary | ICD-10-CM | POA: Insufficient documentation

## 2017-06-02 MED ORDER — ACETAMINOPHEN 500 MG PO TABS
1000.0000 mg | ORAL_TABLET | Freq: Once | ORAL | Status: AC
  Administered 2017-06-02: 1000 mg via ORAL
  Filled 2017-06-02: qty 2

## 2017-06-02 NOTE — ED Provider Notes (Signed)
[facility]-EMERGENCY DEPT Provider Note   CSN: [id number] Arrival date & time: 06/02/17  2249     History   Chief Complaint No chief complaint on file.   HPI Collin White is a 21 y.o. male.  The history is provided by the patient.  Illness  This is a chronic problem. The current episode started more than 1 week ago. The problem occurs constantly. The problem has not changed since onset.Pertinent negatives include no chest pain, no headaches and no shortness of breath. Nothing aggravates the symptoms. Nothing relieves the symptoms. He has tried nothing for the symptoms. The treatment provided no relief.    Past Medical History:  Diagnosis Date  . Seizures [name])     Patient Active Problem List   Diagnosis Date Noted  . 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 Psychoses (HCC)   . Schizophrenia (HCC) 09/29/2015  . Cannabis use disorder, moderate, in early remission (HCC) 09/26/2015  . History of posttraumatic stress disorder (PTSD) 09/26/2015    Past Surgical History:  Procedure Laterality Date  . abd surgery s/p traumatic event    . facial reconstructive surgery    . NO PAST SURGERIES  patient stated he had facial reconstruction surgery as a child   . tumor removed from head          Home Medications    Prior to Admission medications   Medication Sig Start Date End Date Taking? Authorizing Provider  ibuprofen (ADVIL,MOTRIN) 200 MG tablet Take 400 mg by mouth every 6 (six) hours as needed for moderate pain.   Yes [provider]  bacitracin ointment Apply 1 application topically 2 (two) times daily. Patient not taking: Reported on 06/02/2017 05/29/17   Antony Madura, PA-C  benztropine (COGENTIN) 1 MG tablet Take 1 tablet (1 mg total) by mouth 2 (two) times daily. Patient not taking: Reported on 05/05/2017 10/02/15   Adonis Brook, NP  haloperidol (HALDOL) 5 MG tablet Take on tablet (5 mg) in the morning.  Take 2 tabs (10 mg)  at bedtime. Patient not taking: Reported on 01/24/2016 10/02/15   Adonis Brook, NP  hydrOXYzine (ATARAX/VISTARIL) 25 MG tablet Take 1 tablet (25 mg total) by mouth 3 (three) times daily as needed for anxiety. Patient not taking: Reported on 01/24/2016 10/02/15   Adonis Brook, NP  Oxcarbazepine (TRILEPTAL) 300 MG tablet Take 1 tablet (300 mg total) by mouth 2 (two) times daily. Patient not taking: Reported on 01/24/2016 10/02/15   Adonis Brook, NP    Family History Family History  Problem Relation Age of Onset  . Hypertension Other   . Mental illness Neg Hx     Social History Social History  Substance Use Topics  . Smoking status: Current Every Day Smoker    Types: Cigarettes  . Smokeless tobacco: Never Used  . Alcohol use No     Allergies   Patient has no known allergies.   Review of Systems Review of Systems  Constitutional: Negative for appetite change and fever.  Respiratory: Negative for shortness of breath.   Cardiovascular: Negative for chest pain.  Gastrointestinal: Negative for vomiting.  Musculoskeletal: Positive for arthralgias.  Skin: Negative for color change.  Neurological: Negative for headaches.  All other systems reviewed and are negative.    Physical Exam Updated Vital Signs There were no vitals taken for this visit.  Physical Exam  Constitutional: He is oriented to person, place, and time. He appears well-developed and well-nourished. No distress.  HENT:  Head: Normocephalic  and atraumatic.  Mouth/Throat: No oropharyngeal exudate.  Eyes: Pupils are equal, round, and reactive to light. Conjunctivae and EOM are normal.  Neck: Normal range of motion. Neck supple.  Cardiovascular: Normal rate, regular rhythm, normal heart sounds and intact distal pulses.   Pulmonary/Chest: Effort normal and breath sounds normal. He has no wheezes.  Abdominal: Soft. Bowel sounds are normal. He exhibits no mass. There is no tenderness. There is no rebound and no  guarding.  Musculoskeletal: Normal range of motion.  Neurological: He is alert and oriented to person, place, and time.  Skin: Skin is warm and dry. Capillary refill takes less than 2 seconds.  Psychiatric: Thought content normal.     ED Treatments / Results  There were no vitals filed for this visit.   Procedures Procedures (including critical care time)  Medications Ordered in ED Medications  acetaminophen (TYLENOL) tablet 1,000 mg (not administered)     Final Clinical Impressions(s) / ED Diagnoses   Final diagnoses:  Chronic foot pain, unspecified laterality   Strict return precautions given for  Shortness of breath, swelling or the lips or tongue, chest pain, dyspnea on exertion, new weakness or numbness changes in vision or speech,  Inability to tolerate liquids or food, changes in voice cough, altered mental status or any concerns. No signs of systemic illness or infection. The patient is nontoxic-appearing on exam and vital signs are within normal limits.    I have reviewed the triage vital signs and the nursing notes. Pertinent labs &imaging results that were available during my care of the patient were reviewed by me and considered in my medical decision making (see chart for details).  After history, exam, and medical workup I feel the patient has been appropriately medically screened and is safe for discharge home. Pertinent diagnoses were discussed with the patient. Patient was given return precautions.  New Prescriptions New Prescriptions   No medications on file     Miaya Lafontant, MD 06/02/17 2332

## 2017-06-02 NOTE — ED Triage Notes (Signed)
Patient is complaining of right foot pain. Patient states that his foot hurts.

## 2017-06-03 ENCOUNTER — Encounter (HOSPITAL_COMMUNITY): Payer: Self-pay | Admitting: *Deleted

## 2017-06-03 ENCOUNTER — Emergency Department (HOSPITAL_COMMUNITY)
Admission: EM | Admit: 2017-06-03 | Discharge: 2017-06-03 | Disposition: A | Discharge status: Home / Self Care | Payer: Self-pay | Attending: Emergency Medicine | Admitting: Emergency Medicine

## 2017-06-03 ENCOUNTER — Emergency Department (HOSPITAL_COMMUNITY): Payer: Self-pay

## 2017-06-03 DIAGNOSIS — Z79899 Other long term (current) drug therapy: Secondary | ICD-10-CM | POA: Insufficient documentation

## 2017-06-03 DIAGNOSIS — R1084 Generalized abdominal pain: Secondary | ICD-10-CM | POA: Insufficient documentation

## 2017-06-03 LAB — COMPREHENSIVE METABOLIC PANEL
ALBUMIN: 3.9 g/dL (ref 3.5–5.0)
ALK PHOS: 83 U/L (ref 38–126)
ALT: 12 U/L — ABNORMAL LOW (ref 17–63)
ANION GAP: 7 (ref 5–15)
AST: 22 U/L (ref 15–41)
BUN: 11 mg/dL (ref 6–20)
CALCIUM: 9.2 mg/dL (ref 8.9–10.3)
CO2: 28 mmol/L (ref 22–32)
Chloride: 104 mmol/L (ref 101–111)
Creatinine, Ser: 0.95 mg/dL (ref 0.61–1.24)
GFR calc Af Amer: >60 mL/min (ref >60)
GFR calc non Af Amer: >60 mL/min (ref >60)
GLUCOSE: 126 mg/dL — AB (ref 65–99)
POTASSIUM: 4 mmol/L (ref 3.5–5.1)
SODIUM: 139 mmol/L (ref 135–145)
Total Bilirubin: 0.6 mg/dL (ref 0.3–1.2)
Total Protein: 6.6 g/dL (ref 6.5–8.1)

## 2017-06-03 LAB — LIPASE, BLOOD: Lipase: 33 U/L (ref 11–51)

## 2017-06-03 LAB — URINALYSIS, ROUTINE W REFLEX MICROSCOPIC
BILIRUBIN URINE: NEGATIVE
Glucose, UA: NEGATIVE mg/dL
HGB URINE DIPSTICK: NEGATIVE
Ketones, ur: NEGATIVE mg/dL
Leukocytes, UA: NEGATIVE
Nitrite: NEGATIVE
PH: 8 (ref 5.0–8.0)
Protein, ur: NEGATIVE mg/dL
SPECIFIC GRAVITY, URINE: 1.009 (ref 1.005–1.030)

## 2017-06-03 LAB — CBC
HEMATOCRIT: 41.6 % (ref 39.0–52.0)
HEMOGLOBIN: 13.7 g/dL (ref 13.0–17.0)
MCH: 28.1 pg (ref 26.0–34.0)
MCHC: 32.9 g/dL (ref 30.0–36.0)
MCV: 85.4 fL (ref 78.0–100.0)
Platelets: 153 10*3/uL (ref 150–400)
RBC: 4.87 MIL/uL (ref 4.22–5.81)
RDW: 14 % (ref 11.5–15.5)
WBC: 4.5 10*3/uL (ref 4.0–10.5)

## 2017-06-03 MED ORDER — ONDANSETRON HCL 4 MG/2ML IJ SOLN
4.0000 mg | Freq: Once | INTRAMUSCULAR | Status: AC
  Administered 2017-06-03: 4 mg via INTRAVENOUS
  Filled 2017-06-03: qty 2

## 2017-06-03 MED ORDER — SODIUM CHLORIDE 0.9 % IV BOLUS (SEPSIS)
1000.0000 mL | Freq: Once | INTRAVENOUS | Status: AC
  Administered 2017-06-03: 1000 mL via INTRAVENOUS

## 2017-06-03 MED ORDER — IOPAMIDOL (ISOVUE-300) INJECTION 61%
INTRAVENOUS | Status: AC
  Administered 2017-06-03: 100 mL
  Filled 2017-06-03: qty 100

## 2017-06-03 MED ORDER — MORPHINE SULFATE (PF) 4 MG/ML IV SOLN
4.0000 mg | Freq: Once | INTRAVENOUS | Status: AC
  Administered 2017-06-03: 4 mg via INTRAVENOUS
  Filled 2017-06-03: qty 1

## 2017-06-03 NOTE — ED Provider Notes (Signed)
[facility] EMERGENCY DEPARTMENT Provider Note   CSN: [id number] Arrival date & time: 06/03/17  1148     History   Chief Complaint Chief Complaint  Patient presents with  . Abdominal Pain    HPI Collin White is a 21 y.o. male.  HPI   Collin White is a 21 y.o. male, with a history of seizures, borderline intellectual functioning, and schizophrenia, presenting to the ED with abdominal pain beginning around 10AM this morning. Pain is generalized, rated 10/10, cramping, nonradiating. Has not had this problem before. Last BM was last night and normal. Last food intake around 10am as well. Pain started while he was eating and worsened as he ate more. Denies N/V/C/D, fever/chills, urinary complaints, CP, SOB, hematochezia/melena, or any other complaints.   States he has been compliant with his prescribed medications, which are managed by [facility].     Past Medical History:  Diagnosis Date  . Seizures [facility])     Patient Active Problem List   Diagnosis Date Noted  . 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 Psychoses (HCC)   . Schizophrenia (HCC) 09/29/2015  . Cannabis use disorder, moderate, in early remission (HCC) 09/26/2015  . History of posttraumatic stress disorder (PTSD) 09/26/2015    Past Surgical History:  Procedure Laterality Date  . abd surgery s/p traumatic event    . facial reconstructive surgery    . NO PAST SURGERIES  patient stated he had facial reconstruction surgery as a child   . tumor removed from head          Home Medications    Prior to Admission medications   Medication Sig Start Date End Date Taking? Authorizing Provider  ibuprofen (ADVIL,MOTRIN) 200 MG tablet Take 400 mg by mouth every 6 (six) hours as needed for moderate pain.   Yes [provider]  bacitracin ointment Apply 1 application topically 2 (two) times daily. Patient not taking: Reported on 06/02/2017 05/29/17   Antony Madura, PA-C    benztropine (COGENTIN) 1 MG tablet Take 1 tablet (1 mg total) by mouth 2 (two) times daily. Patient not taking: Reported on 05/05/2017 10/02/15   Adonis Brook, NP  haloperidol (HALDOL) 5 MG tablet Take on tablet (5 mg) in the morning.  Take 2 tabs (10 mg) at bedtime. Patient not taking: Reported on 01/24/2016 10/02/15   Adonis Brook, NP  hydrOXYzine (ATARAX/VISTARIL) 25 MG tablet Take 1 tablet (25 mg total) by mouth 3 (three) times daily as needed for anxiety. Patient not taking: Reported on 01/24/2016 10/02/15   Adonis Brook, NP  Oxcarbazepine (TRILEPTAL) 300 MG tablet Take 1 tablet (300 mg total) by mouth 2 (two) times daily. Patient not taking: Reported on 01/24/2016 10/02/15   Adonis Brook, NP    Family History Family History  Problem Relation Age of Onset  . Hypertension Other   . Mental illness Neg Hx     Social History Social History  Substance Use Topics  . Smoking status: Current Every Day Smoker    Types: Cigarettes  . Smokeless tobacco: Never Used  . Alcohol use No     Allergies   Patient has no known allergies.   Review of Systems Review of Systems  Constitutional: Negative for chills, diaphoresis and fever.  Respiratory: Negative for shortness of breath.   Cardiovascular: Negative for chest pain.  Gastrointestinal: Positive for abdominal pain. Negative for constipation, diarrhea, nausea and vomiting.  Genitourinary: Negative for dysuria and hematuria.  All other systems reviewed and are negative.  Physical Exam Updated Vital Signs BP 120/76 (BP Location: Left Arm)   Pulse 65   Temp 98.2 F (36.8 C) (Oral)   Resp 17   SpO2 100%   Physical Exam  Constitutional: He appears well-developed and well-nourished. No distress.  HENT:  Head: Normocephalic and atraumatic.  Eyes: Conjunctivae are normal.  Neck: Neck supple.  Cardiovascular: Normal rate, regular rhythm, normal heart sounds and intact distal pulses.   Pulmonary/Chest: Effort normal and  breath sounds normal. No respiratory distress.  Abdominal: Soft. Normal appearance. Bowel sounds are increased. There is generalized tenderness. There is no guarding and no CVA tenderness.  Patient has varying reactions to palpation of the abdomen. When I was obviously palpating his abdomen he groaned everywhere I touched, however, when I put pressure on the abdomen during ascultation, he had little to no reaction.  Musculoskeletal: He exhibits no edema.  Lymphadenopathy:    He has no cervical adenopathy.  Neurological: He is alert.  Skin: Skin is warm and dry. He is not diaphoretic.  Psychiatric: He has a normal mood and affect. His behavior is normal.  Nursing note and vitals reviewed.    ED Treatments / Results  Labs (all labs ordered are listed, but only abnormal results are displayed) Labs Reviewed  COMPREHENSIVE METABOLIC PANEL - Abnormal; Notable for the following:       Result Value   Glucose, Bld 126 (*)    ALT 12 (*)    All other components within normal limits  URINALYSIS, ROUTINE W REFLEX MICROSCOPIC - Abnormal; Notable for the following:    Color, Urine STRAW (*)    All other components within normal limits  LIPASE, BLOOD  CBC    EKG  EKG Interpretation None      Latest QTc measurement was 368ms on 05/04/17.  Radiology Ct Abdomen Pelvis W Contrast  Result Date: 06/03/2017 CLINICAL DATA:  Upper abdominal pain which started at 10 a.m. History of remote abdominal surgeries secondary to trauma. EXAM: CT ABDOMEN AND PELVIS WITH CONTRAST TECHNIQUE: Multidetector CT imaging of the abdomen and pelvis was performed using the standard protocol following bolus administration of intravenous contrast. CONTRAST:  100mL ISOVUE-300 IOPAMIDOL (ISOVUE-300) INJECTION 61% COMPARISON:  Scrotal ultrasound 05/03/2017. FINDINGS: Lower chest: Subpleural right lower lobe 4 mm nodule is likely a subpleural lymph node, especially given patient age. Normal heart size without pericardial or  pleural effusion. Hepatobiliary: Focal steatosis adjacent the falciform ligament. No suspicious liver lesion. Normal gallbladder, without biliary ductal dilatation. Pancreas: Normal, without mass or ductal dilatation. Spleen: Normal in size, without focal abnormality. Adrenals/Urinary Tract: Normal adrenal glands. Normal kidneys, without hydronephrosis. Normal urinary bladder. Stomach/Bowel: Normal stomach, without wall thickening. There is a paucity of abdominopelvic fat. Little if any enteric contrast opacification. This degrades evaluation. Normal colon and terminal ileum. The appendix is not confidently identified. No convincing evidence of right lower quadrant inflammation. Normal small bowel caliber. Vascular/Lymphatic: Normal caliber of the aorta and branch vessels. No abdominopelvic adenopathy. Reproductive: Normal prostate. Other: No significant free fluid. No free intraperitoneal air. Possible soft tissue fullness about the umbilicus, including image 43/series 3. Musculoskeletal: No acute osseous abnormality. IMPRESSION: 1. Mildly degraded exam secondary to paucity of abdominopelvic fat and little if any enteric contrast opacification. 2. Given this factor, no acute findings in the abdomen or pelvis. 3. Appendix not confidently identified. 4. Possible soft tissue fullness about the umbilicus. Consider physical exam correlation. Electronically Signed   By: Jeronimo GreavesKyle  Talbot M.D.   On: 06/03/2017 15:04  Procedures Procedures (including critical care time)  Medications Ordered in ED Medications  sodium chloride 0.9 % bolus 1,000 mL (0 mLs Intravenous Stopped 06/03/17 1553)  morphine 4 MG/ML injection 4 mg (4 mg Intravenous Given 06/03/17 1324)  ondansetron (ZOFRAN) injection 4 mg (4 mg Intravenous Given 06/03/17 1323)  iopamidol (ISOVUE-300) 61 % injection (100 mLs  Contrast Given 06/03/17 1428)     Initial Impression / Assessment and Plan / ED Course  I have reviewed the triage vital signs and  the nursing notes.  Pertinent labs & imaging results that were available during my care of the patient were reviewed by me and considered in my medical decision making (see chart for details).     Patient presents with abdominal pain. Patient is nontoxic appearing, afebrile, not tachycardic, not tachypneic, not hypotensive, maintains SPO2 of 100% on room air, and is in no apparent distress. Resolved with minimal intervention. Subsequent abdominal exams benign and patient noted to be sleeping and in no apparent distress.  No definite abnormalities noted on CT. Appendix not well visualized, however, no signs of surrounding inflammation. Patient to return in 24 hours should symptoms persist for reexam.  The patient was given instructions for home care as well as return precautions. Patient voices understanding of these instructions, accepts the plan, and is comfortable with discharge.  Findings and plan of care discussed with Alona Bene, MD. Dr. Jacqulyn Bath personally evaluated and examined this patient.  Vitals:   06/03/17 1215 06/03/17 1315 06/03/17 1415 06/03/17 1554  BP: 119/85 112/77 125/86 109/71  Pulse: 70 61 65 62  Resp:    16  Temp:      TempSrc:      SpO2: 100% 100% 100% 100%     Final Clinical Impressions(s) / ED Diagnoses   Final diagnoses:  Generalized abdominal pain    New Prescriptions Discharge Medication List as of 06/03/2017  3:32 PM       Anselm Pancoast, PA-C 06/03/17 1609    Correna Meacham, Hillard Danker, PA-C 06/03/17 1609    Maia Plan, MD 06/04/17 [phone]

## 2017-06-03 NOTE — ED Triage Notes (Signed)
Pt arrived by [facility] for left side abd pain that started today. Reports he feels dizzy when pain occurs. Denies n/v/d or urinary symptoms.

## 2017-06-03 NOTE — Discharge Instructions (Signed)
You have been seen today for abdominal pain. The appendix was not well seen on the CT, but there were no exam-correlated abnormalities noted. If pain is no better in 24 hours, return to the ED for reexam. Return to the ED sooner should symptoms worsen. May use ibuprofen or Tylenol for pain. Be sure to stay well hydrated by drinking plenty of water.

## 2017-06-04 ENCOUNTER — Emergency Department (HOSPITAL_COMMUNITY)
Admission: EM | Admit: 2017-06-04 | Discharge: 2017-06-05 | Disposition: A | Discharge status: Home / Self Care | Payer: Self-pay | Attending: Emergency Medicine | Admitting: Emergency Medicine

## 2017-06-04 ENCOUNTER — Encounter (HOSPITAL_COMMUNITY): Payer: Self-pay

## 2017-06-04 DIAGNOSIS — R51 Headache: Secondary | ICD-10-CM | POA: Insufficient documentation

## 2017-06-04 DIAGNOSIS — Z79899 Other long term (current) drug therapy: Secondary | ICD-10-CM | POA: Insufficient documentation

## 2017-06-04 DIAGNOSIS — R519 Headache, unspecified: Secondary | ICD-10-CM

## 2017-06-04 DIAGNOSIS — R4183 Borderline intellectual functioning: Secondary | ICD-10-CM | POA: Insufficient documentation

## 2017-06-04 DIAGNOSIS — F17210 Nicotine dependence, cigarettes, uncomplicated: Secondary | ICD-10-CM | POA: Insufficient documentation

## 2017-06-04 MED ORDER — IBUPROFEN 800 MG PO TABS
800.0000 mg | ORAL_TABLET | Freq: Once | ORAL | Status: AC
  Administered 2017-06-05: 800 mg via ORAL
  Filled 2017-06-04: qty 1

## 2017-06-04 MED ORDER — ACETAMINOPHEN 325 MG PO TABS
650.0000 mg | ORAL_TABLET | Freq: Once | ORAL | Status: AC
  Administered 2017-06-05: 650 mg via ORAL
  Filled 2017-06-04: qty 2

## 2017-06-04 NOTE — ED Provider Notes (Signed)
[facility]-EMERGENCY DEPT Provider Note   CSN: [id number] Arrival date & time: 06/04/17  1926     History   Chief Complaint Chief Complaint  Patient presents with  . Headache    HPI Collin White is a 21 y.o. male.  HPI   Collin White is a 21 year old male with a history of PTSD, schizophrenia, borderline personality disorder, psychosis, seizure disorder who presents the emergency department with complaint of headache. Patient states that he was seen in the ER yesterday for abdominal pain, but starting today his pain "moved into my head." States that he has 10/10 "squeezing" headache located in his face and bilateral temples. It is worsened with leaning forward. Denies numbness, weakness, fever, neck pain, abdominal pain, nausea/vomiting, photophobia, phonophobia, visual changes. He has not taken any over-the-counter medications for his pain. States that he has had similar headaches in the past which have been improved with ibuprofen. He denies drug use today.   Past Medical History:  Diagnosis Date  . Seizures [name])     Patient Active Problem List   Diagnosis Date Noted  . 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 Psychoses (HCC)   . Schizophrenia (HCC) 09/29/2015  . Cannabis use disorder, moderate, in early remission (HCC) 09/26/2015  . History of posttraumatic stress disorder (PTSD) 09/26/2015    Past Surgical History:  Procedure Laterality Date  . abd surgery s/p traumatic event    . facial reconstructive surgery    . NO PAST SURGERIES  patient stated he had facial reconstruction surgery as a child   . tumor removed from head          Home Medications    Prior to Admission medications   Medication Sig Start Date End Date Taking? Authorizing Provider  ibuprofen (ADVIL,MOTRIN) 200 MG tablet Take 400 mg by mouth every 6 (six) hours as needed for moderate pain.   Yes [provider]  bacitracin ointment Apply 1 application  topically 2 (two) times daily. Patient not taking: Reported on 06/02/2017 05/29/17   Antony Madura, PA-C  benztropine (COGENTIN) 1 MG tablet Take 1 tablet (1 mg total) by mouth 2 (two) times daily. Patient not taking: Reported on 05/05/2017 10/02/15   Adonis Brook, NP  haloperidol (HALDOL) 5 MG tablet Take on tablet (5 mg) in the morning.  Take 2 tabs (10 mg) at bedtime. Patient not taking: Reported on 01/24/2016 10/02/15   Adonis Brook, NP  hydrOXYzine (ATARAX/VISTARIL) 25 MG tablet Take 1 tablet (25 mg total) by mouth 3 (three) times daily as needed for anxiety. Patient not taking: Reported on 01/24/2016 10/02/15   Adonis Brook, NP  ibuprofen (ADVIL,MOTRIN) 800 MG tablet Take 1 tablet (800 mg total) by mouth every 6 (six) hours as needed. 06/05/17   Kellie Shropshire, PA-C  Oxcarbazepine (TRILEPTAL) 300 MG tablet Take 1 tablet (300 mg total) by mouth 2 (two) times daily. Patient not taking: Reported on 01/24/2016 10/02/15   Adonis Brook, NP    Family History Family History  Problem Relation Age of Onset  . Hypertension Other   . Mental illness Neg Hx     Social History Social History  Substance Use Topics  . Smoking status: Current Every Day Smoker    Types: Cigarettes  . Smokeless tobacco: Never Used  . Alcohol use No     Allergies   Patient has no known allergies.   Review of Systems Review of Systems  Constitutional: Negative for chills, fatigue and fever.  HENT: Positive for  sinus pain and sinus pressure. Negative for congestion, dental problem, ear pain, facial swelling, sore throat and trouble swallowing.   Eyes: Negative for photophobia and visual disturbance.  Respiratory: Negative for shortness of breath.   Cardiovascular: Negative for chest pain.  Gastrointestinal: Negative for abdominal pain.  Genitourinary: Negative for difficulty urinating.  Musculoskeletal: Negative for gait problem, neck pain and neck stiffness.  Skin: Negative for rash and wound.    Neurological: Positive for headaches. Negative for dizziness, facial asymmetry, speech difficulty, weakness, light-headedness and numbness.  Psychiatric/Behavioral: Negative for confusion.     Physical Exam Updated Vital Signs BP 124/77 (BP Location: Left Arm)   Pulse 67   Temp 97.8 F (36.6 C) (Oral)   Resp 18   Ht 6' (1.829 m)   Wt 68 kg (150 lb)   SpO2 99%   BMI 20.34 kg/m   Physical Exam  Constitutional: He is oriented to person, place, and time. He appears well-developed and well-nourished. No distress.  HENT:  Head: Normocephalic and atraumatic.  Right Ear: External ear normal.  Left Ear: External ear normal.  Mouth/Throat: Oropharynx is clear and moist. No oropharyngeal exudate.  Tender to palpation over maxillary and frontal sinus. Bilateral TMs with good cone of light.   Eyes: Pupils are equal, round, and reactive to light. Conjunctivae and EOM are normal. Right eye exhibits no discharge. Left eye exhibits no discharge.  Neck: Normal range of motion. Neck supple.  Cardiovascular: Normal rate, regular rhythm and intact distal pulses.  Exam reveals no friction rub.   No murmur heard. Pulmonary/Chest: Effort normal and breath sounds normal. No respiratory distress. He has no wheezes. He has no rales.  Abdominal: Soft. Bowel sounds are normal. There is no tenderness. There is no rebound and no guarding.  Musculoskeletal: Normal range of motion.  Lymphadenopathy:    He has no cervical adenopathy.  Neurological: He is alert and oriented to person, place, and time.  Mental Status:  Alert, oriented, thought content appropriate, able to give a coherent history. Speech fluent without evidence of aphasia. Able to follow 2 step commands without difficulty.  Cranial Nerves:  II:  Peripheral visual fields grossly normal, pupils equal, round, reactive to light III,IV, VI: ptosis not present, extra-ocular motions intact bilaterally  V,VII: smile symmetric, facial light touch  sensation equal VIII: hearing grossly normal to voice  X: uvula elevates symmetrically  XI: bilateral shoulder shrug symmetric and strong XII: midline tongue extension without fassiculations Motor:  Normal tone. 5/5 in upper and lower extremities bilaterally including strong and equal grip strength and dorsiflexion/plantar flexion Sensory: Pinprick and light touch normal in all extremities.  Deep Tendon Reflexes: 2+ and symmetric in the biceps and patella Cerebellar: normal finger-to-nose with bilateral upper extremities Gait: normal gait and balance  Skin: Skin is warm and dry. Capillary refill takes less than 2 seconds. He is not diaphoretic.  Psychiatric: He has a normal mood and affect. His behavior is normal.  Nursing note and vitals reviewed.    ED Treatments / Results  Labs (all labs ordered are listed, but only abnormal results are displayed) Labs Reviewed - No data to display  EKG  EKG Interpretation None       Radiology Ct Abdomen Pelvis W Contrast  Result Date: 06/03/2017 CLINICAL DATA:  Upper abdominal pain which started at 10 a.m. History of remote abdominal surgeries secondary to trauma. EXAM: CT ABDOMEN AND PELVIS WITH CONTRAST TECHNIQUE: Multidetector CT imaging of the abdomen and pelvis was performed using  the standard protocol following bolus administration of intravenous contrast. CONTRAST:  100mL ISOVUE-300 IOPAMIDOL (ISOVUE-300) INJECTION 61% COMPARISON:  Scrotal ultrasound 05/03/2017. FINDINGS: Lower chest: Subpleural right lower lobe 4 mm nodule is likely a subpleural lymph node, especially given patient age. Normal heart size without pericardial or pleural effusion. Hepatobiliary: Focal steatosis adjacent the falciform ligament. No suspicious liver lesion. Normal gallbladder, without biliary ductal dilatation. Pancreas: Normal, without mass or ductal dilatation. Spleen: Normal in size, without focal abnormality. Adrenals/Urinary Tract: Normal adrenal glands.  Normal kidneys, without hydronephrosis. Normal urinary bladder. Stomach/Bowel: Normal stomach, without wall thickening. There is a paucity of abdominopelvic fat. Little if any enteric contrast opacification. This degrades evaluation. Normal colon and terminal ileum. The appendix is not confidently identified. No convincing evidence of right lower quadrant inflammation. Normal small bowel caliber. Vascular/Lymphatic: Normal caliber of the aorta and branch vessels. No abdominopelvic adenopathy. Reproductive: Normal prostate. Other: No significant free fluid. No free intraperitoneal air. Possible soft tissue fullness about the umbilicus, including image 43/series 3. Musculoskeletal: No acute osseous abnormality. IMPRESSION: 1. Mildly degraded exam secondary to paucity of abdominopelvic fat and little if any enteric contrast opacification. 2. Given this factor, no acute findings in the abdomen or pelvis. 3. Appendix not confidently identified. 4. Possible soft tissue fullness about the umbilicus. Consider physical exam correlation. Electronically Signed   By: Jeronimo GreavesKyle  Talbot M.D.   On: 06/03/2017 15:04    Procedures Procedures (including critical care time)  Medications Ordered in ED Medications  ibuprofen (ADVIL,MOTRIN) tablet 800 mg (800 mg Oral Given 06/05/17 0021)  acetaminophen (TYLENOL) tablet 650 mg (650 mg Oral Given 06/05/17 0021)     Initial Impression / Assessment and Plan / ED Course  I have reviewed the triage vital signs and the nursing notes.  Pertinent labs & imaging results that were available during my care of the patient were reviewed by me and considered in my medical decision making (see chart for details).  Clinical Course as of Jun 05 110  Fri Jun 05, 2017  0103 Patient is sleeping on reevaluation. Upon waking he states that his headache is feeling "a little better." Able to tolerate PO fluids at bedside.   [ES]    Clinical Course User Index [ES] Kellie ShropshireShrosbree, Chenee Munns J, PA-C     Pt HA treated and improved while in ED. Presentation is like pts previous HA and non concerning for [facility], ICH, Meningitis, or temporal arteritis. Pt is afebrile with no focal neuro deficits, nuchal rigidity, or change in vision. Patient does not have a primary care provider, have given him information to follow up with [facility] to establish care and for follow up of headache concerns. Have discussed return precautions including development of a fever and neck pain with headache, headache with numbness/weakness, headache with nausea/vomiting that does not stop. Pt verbalizes understanding and is agreeable with plan to dc. Discussed this patient with Dr. Read DriversMolpus who agrees with plan to discharge.   Final Clinical Impressions(s) / ED Diagnoses   Final diagnoses:  Acute nonintractable headache, unspecified headache type    New Prescriptions New Prescriptions   IBUPROFEN (ADVIL,MOTRIN) 800 MG TABLET    Take 1 tablet (800 mg total) by mouth every 6 (six) hours as needed.     Kellie ShropshireShrosbree, Flordia Kassem J, PA-C 06/05/17 0111    Molpus, Jonny RuizJohn, MD 06/05/17 [phone]

## 2017-06-04 NOTE — ED Triage Notes (Signed)
Pt presents with c/o headache. [facility] and was [facility] for his abdominal pain. Pt reports blurred vision when he has a headache. Neurologically intact.

## 2017-06-05 MED ORDER — IBUPROFEN 800 MG PO TABS: 800.0000 mg | ORAL_TABLET | Freq: Four times a day (QID) | ORAL | 0 refills | Status: DC | PRN

## 2017-06-05 NOTE — Discharge Instructions (Signed)
Please take 800 mg ibuprofen every 6 hours as needed for headache. I have given her information below to establish care at [facility]. They accept patients who do not have insurance. Please schedule an appointment to establish care and for follow-up of headache concerns.  Return to the emergency department if you have headache with fever and neck stiffness, headache with nausea/vomiting that does not stop, headache with numbness and weakness in which you cannot walk or any new or worsening symptoms.

## 2017-06-06 ENCOUNTER — Emergency Department (HOSPITAL_COMMUNITY)
Admission: EM | Admit: 2017-06-06 | Discharge: 2017-06-08 | Disposition: A | Discharge status: Other Acute Inpatient Hospital | Payer: Self-pay | Attending: Emergency Medicine | Admitting: Emergency Medicine

## 2017-06-06 ENCOUNTER — Emergency Department (HOSPITAL_COMMUNITY): Payer: Self-pay

## 2017-06-06 ENCOUNTER — Encounter (HOSPITAL_COMMUNITY): Payer: Self-pay | Admitting: Emergency Medicine

## 2017-06-06 DIAGNOSIS — F17210 Nicotine dependence, cigarettes, uncomplicated: Secondary | ICD-10-CM | POA: Insufficient documentation

## 2017-06-06 DIAGNOSIS — F22 Delusional disorders: Secondary | ICD-10-CM

## 2017-06-06 DIAGNOSIS — F200 Paranoid schizophrenia: Secondary | ICD-10-CM | POA: Insufficient documentation

## 2017-06-06 HISTORY — DX: Schizophrenia, unspecified: F20.9

## 2017-06-06 HISTORY — DX: Unspecified psychosis not due to a substance or known physiological condition: F29

## 2017-06-06 LAB — RAPID URINE DRUG SCREEN, HOSP PERFORMED
Amphetamines: NOT DETECTED
BENZODIAZEPINES: NOT DETECTED
Barbiturates: NOT DETECTED
Cocaine: NOT DETECTED
OPIATES: NOT DETECTED
TETRAHYDROCANNABINOL: NOT DETECTED

## 2017-06-06 NOTE — ED Provider Notes (Signed)
Reports to me that he feels like his trachea is been. He is alert. Eating a sandwich. Breathing normally. Oropharynx is normal. He has no pain on manipulation of laryngeal cartilage. No crepitance. Lungs clear auscultation chest nontender. Chest x-ray viewed by me   Doug SouJacubowitz, Sharis Keeran, MD 06/06/17 2303

## 2017-06-06 NOTE — ED Triage Notes (Signed)
[facility] [employer] [facility] with c/o delusions.  Pt reported that "his trachea is breaking".  Hx of schizophrenia and psychoses.

## 2017-06-06 NOTE — BH Assessment (Signed)
[name] Assessment Progress Note  TTS consult ordered for pt who is currently in the hallway [facility]. TTS spoke with Liborio NixonMarquez, Allan C, RN and advised him to contact TTS once pt has been moved to conference room or other private room.  Princess BruinsAquicha Jeris Easterly, MSW, LCSW Therapeutic Triage Specialist  [phone]

## 2017-06-06 NOTE — BH Assessment (Signed)
[name] Assessment Progress Note  TTS completed assessment and consulted with Nira ConnJason Berry, NP who recommends inpt treatment. EDP Cristina GongHammond, Elizabeth W, PA-C and pt's nurse Liborio NixonMarquez, Allan C, RN have been notified of the disposition and are in agreement. TTS to seek placement [facility] RailJoann Glover, RN.  Princess BruinsAquicha Shantanique Hodo, MSW, LCSW Therapeutic Triage Specialist  [phone]

## 2017-06-06 NOTE — BH Assessment (Addendum)
Assessment Note  Collin White is an 21 y.o. male who presents to the ED voluntarily. Pt delusional at present. Pt states he was eating a strawberry pie and his trachea was breaking and bending. Pt asked this writer to call him "key lace love". Pt rambling in incoherent and tangential speech throughout the assessment. Pt has 28 ED visits in the past 6 months c/o various medical complaints. Pt denies SI, HI, AVH at current, however pt presenting with disorganized thought patterns and flight of ideas. Pt was asked to identify his highest level of education and pt began to ramble in a tangent stating "I got to 12th grade and I was about to graduate and they jumped me back to 10th grade to take a special class with the highest honors in math and science and I'm starting college tomorrow and I will be with the brilliant minds of tomorrow." Pt began smiling and stated "I have to do an anatomy project." Pt stated "because of my high excellence, I have sniper rifles and 9 sub-machine guns but they won't let me have it." At the beginning of the assessment, the pt stated he has pain in his left thigh, at the conclusion of the assessment the pt reported to this writer that the pain is in his right thigh and not his left.    Pt appears disheveled during the assessment. Speech is slow and slurred and has continued thought blocking throughout the assessment.   TTS completed assessment and consulted with Nira ConnJason Berry, NP who recommends inpt treatment. EDP Cristina GongHammond, Elizabeth W, PA-C and pt's nurse Liborio NixonMarquez, Allan C, RN have been notified of the disposition and are in agreement. TTS to seek placement [facility] Binnie RailJoann Glover, RN.  Diagnosis: Schizophrenia   Past Medical History:  Past Medical History:  Diagnosis Date  . Psychoses (HCC)   . Schizophrenia (HCC)   . Seizures (HCC)     Past Surgical History:  Procedure Laterality Date  . abd surgery s/p traumatic event    .  facial reconstructive surgery    . NO PAST SURGERIES  patient stated he had facial reconstruction surgery as a child   . tumor removed from head       Family History:  Family History  Problem Relation Age of Onset  . Hypertension Other   . Mental illness Neg Hx     Social History:  reports that he has been smoking Cigarettes.  He has never used smokeless tobacco. He reports that he does not drink alcohol or use drugs.  Additional Social History:  Alcohol / Drug Use Pain Medications: See MAR Prescriptions: See MAR Over the Counter: See MAR History of alcohol / drug use?: No history of alcohol / drug abuse  CIWA: CIWA-Ar BP: (!) 134/93 Pulse Rate: 79 COWS:    Allergies: No Known Allergies  Home Medications:  (Not in a hospital admission)  OB/GYN Status:  No LMP for male patient.  General Assessment Data Location of Assessment: [facility] TTS Assessment: In system Is this a Tele or Face-to-Face Assessment?: Face-to-Face Is this an Initial Assessment or a Re-assessment for this encounter?: Initial Assessment Marital status: Single Is patient pregnant?: No Pregnancy Status: No Living Arrangements: Alone Can pt return to current living arrangement?: Yes Admission Status: Voluntary Is patient capable of signing voluntary admission?: Yes Referral Source: Self/Family/Friend Insurance type: none on file      Crisis Care Plan Living Arrangements: Alone Name of  Psychiatrist: [facility] Name of Therapist: unknown  Education Status Is patient currently in school?: No Highest grade of school patient has completed: unknown, pt began rambling when asked about education   Risk to self with the past 6 months Suicidal Ideation: No Has patient been a risk to self within the past 6 months prior to admission? : No Suicidal Intent: No Has patient had any suicidal intent within the past 6 months prior to admission? : No Is patient at risk for suicide?:  No Suicidal Plan?: No Has patient had any suicidal plan within the past 6 months prior to admission? : No Access to Means: No What has been your use of drugs/alcohol within the last 12 months?: denies use  Previous Attempts/Gestures: No Triggers for Past Attempts: None known Intentional Self Injurious Behavior: None Family Suicide History: No Recent stressful life event(s): Other (Comment) (ongoing AVH) Persecutory voices/beliefs?: No Depression: No Substance abuse history and/or treatment for substance abuse?: No Suicide prevention information given to non-admitted patients: Not applicable  Risk to Others within the past 6 months Homicidal Ideation: No Does patient have any lifetime risk of violence toward others beyond the six months prior to admission? : No Thoughts of Harm to Others: No Current Homicidal Intent: No Current Homicidal Plan: No Access to Homicidal Means: No History of harm to others?: No Assessment of Violence: None Noted Does patient have access to weapons?: No Criminal Charges Pending?: No Does patient have a court date: No Is patient on probation?: No  Psychosis Hallucinations: None noted Delusions: Grandiose  Mental Status Report Appearance/Hygiene: Disheveled, Poor hygiene Eye Contact: Good Motor Activity: Unsteady Speech: Tangential, Soft Level of Consciousness: Alert Mood: Labile Affect: Flat, Inconsistent with thought content Anxiety Level: None Thought Processes: Irrelevant, Flight of Ideas, Tangential Judgement: Impaired Orientation: Person, Place, Time Obsessive Compulsive Thoughts/Behaviors: None  Cognitive Functioning Concentration: Poor Memory: Remote Impaired, Recent Impaired IQ: Average Insight: Poor Impulse Control: Fair Appetite: Good Sleep: No Change Total Hours of Sleep: 8 Vegetative Symptoms: None  ADLScreening [facility]) Patient's cognitive ability adequate to safely complete daily activities?:  Yes Patient able to express need for assistance with ADLs?: Yes Independently performs ADLs?: Yes (appropriate for developmental age)  Prior Inpatient Therapy Prior Inpatient Therapy: No  Prior Outpatient Therapy Prior Outpatient Therapy: Yes Prior Therapy Dates: current  Prior Therapy Facilty/Provider(s): [facility]  Reason for Treatment: Schizophrenia Does patient have an ACCT team?: No Does patient have Intensive In-House Services?  : No Does patient have [name] services? : No Does patient have P4CC services?: No  ADL Screening (condition at time of admission) Patient's cognitive ability adequate to safely complete daily activities?: Yes Is the patient deaf or have difficulty hearing?: No Does the patient have difficulty seeing, even when wearing glasses/contacts?: No Does the patient have difficulty concentrating, remembering, or making decisions?: Yes Patient able to express need for assistance with ADLs?: Yes Does the patient have difficulty dressing or bathing?: No Independently performs ADLs?: Yes (appropriate for developmental age) Does the patient have difficulty walking or climbing stairs?: No Weakness of Legs: None Weakness of Arms/Hands: None  Home Assistive Devices/Equipment Home Assistive Devices/Equipment: None    Abuse/Neglect Assessment (Assessment to be complete while patient is alone) Physical Abuse: Denies Verbal Abuse: Denies Sexual Abuse: Denies Exploitation of patient/patient's resources: Denies Self-Neglect: Denies     Merchant navy officer (For Healthcare) Does Patient Have a Medical Advance Directive?: No Would patient like information on creating a medical  advance directive?: No - Patient declined    Additional Information 1:1 In Past 12 Months?: No CIRT Risk: No Elopement Risk: No Does patient have medical clearance?: Yes     Disposition:  Disposition Initial Assessment Completed for this Encounter:  Yes Disposition of Patient: Inpatient treatment program Type of inpatient treatment program: Adult (per Nira Conn, NP)  On Site Evaluation by:   Reviewed with Physician:    Karolee Ohs 06/07/2017 12:58 AM

## 2017-06-06 NOTE — ED Provider Notes (Signed)
[facility]-EMERGENCY DEPT Provider Note   CSN: [id number] Arrival date & time: 06/06/17  2136     History   Chief Complaint Chief Complaint  Patient presents with  . Delusional    HPI Collin White is a 21 y.o. male with a history of psychosis, schizophrenia, seizures, who presents today for evaluation of feeling like his trachea is breaking.  He denies any recent trauma. He also reports pain from where the EMTs used their stethoscope to obtain his blood pressure. He reports that it was the stethoscope that caused the pain not be blood pressure cuff. He reports that he called 911, and reports his chest is hurting.    HPI  Past Medical History:  Diagnosis Date  . Psychoses (HCC)   . Schizophrenia (HCC)   . Seizures [facility])     Patient Active Problem List   Diagnosis Date Noted  . 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 Psychoses (HCC)   . Schizophrenia (HCC) 09/29/2015  . Cannabis use disorder, moderate, in early remission (HCC) 09/26/2015  . History of posttraumatic stress disorder (PTSD) 09/26/2015    Past Surgical History:  Procedure Laterality Date  . abd surgery s/p traumatic event    . facial reconstructive surgery    . NO PAST SURGERIES  patient stated he had facial reconstruction surgery as a child   . tumor removed from head          Home Medications    Prior to Admission medications   Medication Sig Start Date End Date Taking? Authorizing Provider  ibuprofen (ADVIL,MOTRIN) 200 MG tablet Take 400 mg by mouth every 6 (six) hours as needed for moderate pain.   Yes [provider]  bacitracin ointment Apply 1 application topically 2 (two) times daily. Patient not taking: Reported on 06/02/2017 05/29/17   Antony Madura, PA-C  benztropine (COGENTIN) 1 MG tablet Take 1 tablet (1 mg total) by mouth 2 (two) times daily. Patient not taking: Reported on 05/05/2017 10/02/15   Adonis Brook, NP  haloperidol (HALDOL) 5 MG  tablet Take on tablet (5 mg) in the morning.  Take 2 tabs (10 mg) at bedtime. Patient not taking: Reported on 01/24/2016 10/02/15   Adonis Brook, NP  hydrOXYzine (ATARAX/VISTARIL) 25 MG tablet Take 1 tablet (25 mg total) by mouth 3 (three) times daily as needed for anxiety. Patient not taking: Reported on 01/24/2016 10/02/15   Adonis Brook, NP  ibuprofen (ADVIL,MOTRIN) 800 MG tablet Take 1 tablet (800 mg total) by mouth every 6 (six) hours as needed. 06/05/17   Kellie Shropshire, PA-C  Oxcarbazepine (TRILEPTAL) 300 MG tablet Take 1 tablet (300 mg total) by mouth 2 (two) times daily. Patient not taking: Reported on 01/24/2016 10/02/15   Adonis Brook, NP    Family History Family History  Problem Relation Age of Onset  . Hypertension Other   . Mental illness Neg Hx     Social History Social History  Substance Use Topics  . Smoking status: Current Every Day Smoker    Types: Cigarettes  . Smokeless tobacco: Never Used  . Alcohol use No     Allergies   Patient has no known allergies.   Review of Systems Review of Systems  Unable to perform ROS: Psychiatric disorder     Physical Exam Updated Vital Signs BP (!) 134/93   Pulse 79   Temp (S) 98.3 F (36.8 C) (Oral)   Resp 18   Ht 6' (1.829 m)   Wt 68 kg (  150 lb)   SpO2 99%   BMI 20.34 kg/m   Physical Exam  Constitutional: He appears well-developed and well-nourished.  HENT:  Head: Normocephalic and atraumatic.  Eyes: Conjunctivae are normal.  Neck: Trachea normal, full passive range of motion without pain and phonation normal. Neck supple. Normal carotid pulses and no JVD present. No tracheal tenderness present. No neck rigidity. No tracheal deviation present. No thyromegaly present.  Cardiovascular: Normal rate, regular rhythm, S1 normal, S2 normal, normal heart sounds, intact distal pulses and normal pulses.   No murmur heard. Pulmonary/Chest: Effort normal and breath sounds normal. No respiratory distress.    Abdominal: Soft. There is no tenderness.  Musculoskeletal: He exhibits no edema.  Skin: Skin is warm and dry.  Psychiatric: His affect is blunt. His speech is delayed. He is slowed, withdrawn and actively hallucinating. Thought content is delusional. He expresses no homicidal and no suicidal ideation. He expresses no suicidal plans and no homicidal plans.  Nursing note and vitals reviewed.    ED Treatments / Results  Labs (all labs ordered are listed, but only abnormal results are displayed) Labs Reviewed  COMPREHENSIVE METABOLIC PANEL - Abnormal; Notable for the following:       Result Value   Glucose, Bld 112 (*)    ALT 13 (*)    All other components within normal limits  RAPID URINE DRUG SCREEN, HOSP PERFORMED  ETHANOL  CBC WITH DIFFERENTIAL/PLATELET    EKG  EKG Interpretation  Date/Time:  Saturday June 06 2017 23:07:03 EDT Ventricular Rate:  75 PR Interval:  118 QRS Duration: 96 QT Interval:  362 QTC Calculation: 404 R Axis:   75 Text Interpretation:  Sinus rhythm with marked sinus arrhythmia Minimal voltage criteria for LVH, may be normal variant Nonspecific ST and T wave abnormality Abnormal ECG When compared with ECG of 05/02/2017, No significant change was found Confirmed by Dione BoozeGlick, David ([id number]) on 06/06/2017 11:13:56 PM       Radiology Dg Chest 2 View  Result Date: 06/06/2017 CLINICAL DATA:  Altered mental status.  Chest pain. EXAM: CHEST  2 VIEW COMPARISON:  05/02/2017 FINDINGS: The heart size and mediastinal contours are within normal limits. Both lungs are clear. The visualized skeletal structures are unremarkable. IMPRESSION: No active cardiopulmonary disease. Electronically Signed   By: Burman NievesWilliam  Stevens M.D.   On: 06/06/2017 22:51    Procedures Procedures (including critical care time)  Medications Ordered in ED Medications - No data to display   Initial Impression / Assessment and Plan / ED Course  I have reviewed the triage vital signs and the  nursing notes.  Pertinent labs & imaging results that were available during my care of the patient were reviewed by me and considered in my medical decision making (see chart for details).  Clinical Course as of Jun 08 111  Wynelle LinkSun Jun 07, 2017  [id number] Followed up with RN regarding blood work.   [EH]    Clinical Course User Index [EH] Cristina GongHammond, Ko Bardon W, PA-C    Collin White presents for evaluation of feeling like his trachea is breaking. I previously saw the patient in July of this year for a similar complaint.  He is without respiratory distress, able to eat and drink normally. Normal appearing oropharynx. He has no difficulty speaking.  He was noticed to be responding to internal stimuli and holding conversations with himself.  EKG obtained without acute abnormalities, chest x-ray obtained without acute abnormalities.  Chart review shows that he often receives workups for vague  physical complaints that are negative. I feel like his symptoms are most likely representative of his psychiatric disorder. TTS was consulted, who agreed that patient needs inpatient treatment for stabilization on medications.  The patient was seen and evaluated by Dr. Ethelda Chick who evaluated the patient and agreed with my plan.   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

## 2017-06-06 NOTE — ED Notes (Signed)
Bed: [facility] Expected date:  Expected time:  Means of arrival:  Comments: 

## 2017-06-07 LAB — CBC WITH DIFFERENTIAL/PLATELET
BASOS ABS: 0 10*3/uL (ref 0.0–0.1)
BASOS PCT: 0 %
EOS ABS: 0 10*3/uL (ref 0.0–0.7)
EOS PCT: 0 %
HCT: 42 % (ref 39.0–52.0)
Hemoglobin: 14.6 g/dL (ref 13.0–17.0)
Lymphocytes Relative: 28 %
Lymphs Abs: 1.9 10*3/uL (ref 0.7–4.0)
MCH: 29.5 pg (ref 26.0–34.0)
MCHC: 34.8 g/dL (ref 30.0–36.0)
MCV: 84.8 fL (ref 78.0–100.0)
MONO ABS: 0.7 10*3/uL (ref 0.1–1.0)
MONOS PCT: 10 %
Neutro Abs: 4.2 10*3/uL (ref 1.7–7.7)
Neutrophils Relative %: 62 %
PLATELETS: 199 10*3/uL (ref 150–400)
RBC: 4.95 MIL/uL (ref 4.22–5.81)
RDW: 13.7 % (ref 11.5–15.5)
WBC: 6.8 10*3/uL (ref 4.0–10.5)

## 2017-06-07 LAB — COMPREHENSIVE METABOLIC PANEL
ALBUMIN: 4.2 g/dL (ref 3.5–5.0)
ALK PHOS: 74 U/L (ref 38–126)
ALT: 13 U/L — ABNORMAL LOW (ref 17–63)
AST: 25 U/L (ref 15–41)
Anion gap: 6 (ref 5–15)
BILIRUBIN TOTAL: 0.5 mg/dL (ref 0.3–1.2)
BUN: 14 mg/dL (ref 6–20)
CALCIUM: 9.5 mg/dL (ref 8.9–10.3)
CO2: 28 mmol/L (ref 22–32)
Chloride: 104 mmol/L (ref 101–111)
Creatinine, Ser: 0.95 mg/dL (ref 0.61–1.24)
GFR calc Af Amer: >60 mL/min (ref >60)
GLUCOSE: 112 mg/dL — AB (ref 65–99)
Potassium: 3.8 mmol/L (ref 3.5–5.1)
Sodium: 138 mmol/L (ref 135–145)
TOTAL PROTEIN: 7.7 g/dL (ref 6.5–8.1)

## 2017-06-07 LAB — ETHANOL

## 2017-06-07 MED ORDER — HALOPERIDOL 5 MG PO TABS
5.0000 mg | ORAL_TABLET | Freq: Two times a day (BID) | ORAL | Status: DC
  Administered 2017-06-07 – 2017-06-08 (×3): 5 mg via ORAL
  Filled 2017-06-07 (×3): qty 1

## 2017-06-07 MED ORDER — OXCARBAZEPINE 300 MG PO TABS
300.0000 mg | ORAL_TABLET | Freq: Two times a day (BID) | ORAL | Status: DC
  Administered 2017-06-07 – 2017-06-08 (×3): 300 mg via ORAL
  Filled 2017-06-07 (×3): qty 1

## 2017-06-07 MED ORDER — TRAZODONE HCL 50 MG PO TABS
50.0000 mg | ORAL_TABLET | Freq: Every day | ORAL | Status: DC
  Administered 2017-06-07: 50 mg via ORAL
  Filled 2017-06-07: qty 1

## 2017-06-07 MED ORDER — BENZTROPINE MESYLATE 1 MG PO TABS
1.0000 mg | ORAL_TABLET | Freq: Every day | ORAL | Status: DC
  Administered 2017-06-07 – 2017-06-08 (×2): 1 mg via ORAL
  Filled 2017-06-07 (×2): qty 1

## 2017-06-07 NOTE — ED Notes (Signed)
Per TTS:  Pt met criteria for in-patient treatment. 

## 2017-06-07 NOTE — ED Notes (Signed)
Pt A&O x 3, no distress noted, calm & cooperative.  Pt sleeping at present.  Monitoring for safety, Q 15 min checks in effect.

## 2017-06-07 NOTE — Progress Notes (Signed)
CSW following for disposition needs.   [facility]:  [facility]  [place]  [place]   Bonetta Mostek, [place] Emergency Room Clinical Social Worker [phone]

## 2017-06-07 NOTE — ED Notes (Addendum)
Pt oriented to room and unit.  Pt has extremely bad body odor.  Pt is delusional continuing to believe that his trachea is breaking.  He states he has many other physical problems.  Pt signed his belonging paper very bizarre.  I was unable to read it but it does not match any of the names patient has or goes by.  Patient prefers to be called [name]  15 minute checks and video monitoring in place.  Pt denies S/I, H/I, and AVH.

## 2017-06-07 NOTE — ED Notes (Signed)
Breakfast Tray Given

## 2017-06-08 ENCOUNTER — Encounter (HOSPITAL_COMMUNITY): Payer: Self-pay

## 2017-06-08 ENCOUNTER — Inpatient Hospital Stay (HOSPITAL_COMMUNITY)
Admission: AD | Admit: 2017-06-08 | Discharge: 2017-06-17 | DRG: 885 | Disposition: A | Discharge status: Home / Self Care | Payer: Federal, State, Local not specified - Other | Source: Intra-hospital | Attending: Psychiatry | Admitting: Psychiatry

## 2017-06-08 DIAGNOSIS — F200 Paranoid schizophrenia: Secondary | ICD-10-CM

## 2017-06-08 DIAGNOSIS — F1221 Cannabis dependence, in remission: Secondary | ICD-10-CM | POA: Diagnosis present

## 2017-06-08 DIAGNOSIS — F329 Major depressive disorder, single episode, unspecified: Secondary | ICD-10-CM | POA: Diagnosis present

## 2017-06-08 DIAGNOSIS — F649 Gender identity disorder, unspecified: Secondary | ICD-10-CM | POA: Diagnosis present

## 2017-06-08 DIAGNOSIS — Z87891 Personal history of nicotine dependence: Secondary | ICD-10-CM

## 2017-06-08 DIAGNOSIS — Z79899 Other long term (current) drug therapy: Secondary | ICD-10-CM

## 2017-06-08 DIAGNOSIS — F1211 Cannabis abuse, in remission: Secondary | ICD-10-CM

## 2017-06-08 DIAGNOSIS — G4700 Insomnia, unspecified: Secondary | ICD-10-CM | POA: Diagnosis present

## 2017-06-08 DIAGNOSIS — R4183 Borderline intellectual functioning: Secondary | ICD-10-CM | POA: Diagnosis present

## 2017-06-08 DIAGNOSIS — Z8249 Family history of ischemic heart disease and other diseases of the circulatory system: Secondary | ICD-10-CM

## 2017-06-08 DIAGNOSIS — F4310 Post-traumatic stress disorder, unspecified: Secondary | ICD-10-CM

## 2017-06-08 DIAGNOSIS — F17210 Nicotine dependence, cigarettes, uncomplicated: Secondary | ICD-10-CM

## 2017-06-08 MED ORDER — MAGNESIUM HYDROXIDE 400 MG/5ML PO SUSP: 30.0000 mL | Freq: Every day | ORAL | Status: DC | PRN

## 2017-06-08 MED ORDER — BENZTROPINE MESYLATE 1 MG PO TABS
1.0000 mg | ORAL_TABLET | Freq: Every day | ORAL | Status: DC
  Administered 2017-06-09 – 2017-06-16 (×7): 1 mg via ORAL
  Filled 2017-06-08 (×6): qty 1
  Filled 2017-06-08: qty 7
  Filled 2017-06-08 (×6): qty 1

## 2017-06-08 MED ORDER — HALOPERIDOL 5 MG PO TABS
5.0000 mg | ORAL_TABLET | Freq: Two times a day (BID) | ORAL | Status: DC
  Administered 2017-06-09 – 2017-06-16 (×13): 5 mg via ORAL
  Filled 2017-06-08 (×19): qty 1

## 2017-06-08 MED ORDER — LOPERAMIDE HCL 2 MG PO CAPS: 2.0000 mg | ORAL_CAPSULE | ORAL | Status: DC | PRN

## 2017-06-08 MED ORDER — ASENAPINE MALEATE 5 MG SL SUBL
5.0000 mg | SUBLINGUAL_TABLET | Freq: Two times a day (BID) | SUBLINGUAL | Status: DC | PRN
  Administered 2017-06-08: 5 mg via SUBLINGUAL
  Filled 2017-06-08: qty 1

## 2017-06-08 MED ORDER — ACETAMINOPHEN 325 MG PO TABS
650.0000 mg | ORAL_TABLET | Freq: Four times a day (QID) | ORAL | Status: DC | PRN
  Administered 2017-06-12: 650 mg via ORAL
  Filled 2017-06-08: qty 2

## 2017-06-08 MED ORDER — OXCARBAZEPINE 300 MG PO TABS
300.0000 mg | ORAL_TABLET | Freq: Two times a day (BID) | ORAL | Status: DC
  Administered 2017-06-09 – 2017-06-17 (×16): 300 mg via ORAL
  Filled 2017-06-08 (×7): qty 1
  Filled 2017-06-08: qty 14
  Filled 2017-06-08 (×6): qty 1
  Filled 2017-06-08: qty 14
  Filled 2017-06-08 (×6): qty 1

## 2017-06-08 MED ORDER — ALUM & MAG HYDROXIDE-SIMETH 200-200-20 MG/5ML PO SUSP: 30.0000 mL | ORAL | Status: DC | PRN

## 2017-06-08 MED ORDER — TRAZODONE HCL 50 MG PO TABS
50.0000 mg | ORAL_TABLET | Freq: Every day | ORAL | Status: DC
  Administered 2017-06-09 – 2017-06-14 (×6): 50 mg via ORAL
  Filled 2017-06-08 (×5): qty 1
  Filled 2017-06-08: qty 7
  Filled 2017-06-08 (×5): qty 1

## 2017-06-08 NOTE — Progress Notes (Signed)
06/08/17 1355:  LRT introduced self to pt and offered activities.  Pt stated he was interested.  Pt went to take a shower at the urging of nurse.  Pt remains in bathroom.   Caroll RancherMarjette Tydus Sanmiguel, LRT/CTRS

## 2017-06-08 NOTE — ED Notes (Signed)
REport to RN Marchelle FolksAmanda , [facility], pending [facility] transport.

## 2017-06-08 NOTE — BH Assessment (Signed)
[name] Assessment Progress Note  Per Thedore MinsMojeed Akintayo, MD, this pt requires psychiatric hospitalization at this time.  Malva LimesLinsey Strader, RN, [facility].  Pt has signed Voluntary Admission and Consent for Treatment, as well as Consent to Release Information to [name], and a notification call has been placed.  Please note that pt has signed forms using a different name than the one showing in his registration record.  S[facility].  Pt's nurse has been notified, and agrees to send original paperwork along with pt via [facility], and to call report to [phone].  Doylene Canninghomas Lashondra Vaquerano, MA Triage Specialist [phone]
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
## 2017-06-08 NOTE — ED Notes (Signed)
Pt awake, alert & responsive, no distress noted.  Resting at present.  Monitoring for safety, Q 15 min checks in effect.  [facility].

## 2017-06-08 NOTE — ED Notes (Signed)
This patient has feces all over his bed and he is laying under the covers.  Pt smells very bad.  I instructed patient to shower and use soap and I changed  his bedding.  Pt is very quiet and withdrawn.  He is very soft spoken.  He continues to complain of Trachea problems.

## 2017-06-09 DIAGNOSIS — R413 Other amnesia: Secondary | ICD-10-CM

## 2017-06-09 DIAGNOSIS — F1210 Cannabis abuse, uncomplicated: Secondary | ICD-10-CM

## 2017-06-09 DIAGNOSIS — F4310 Post-traumatic stress disorder, unspecified: Secondary | ICD-10-CM

## 2017-06-09 DIAGNOSIS — F419 Anxiety disorder, unspecified: Secondary | ICD-10-CM

## 2017-06-09 DIAGNOSIS — F200 Paranoid schizophrenia: Principal | ICD-10-CM

## 2017-06-09 DIAGNOSIS — F22 Delusional disorders: Secondary | ICD-10-CM
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
## 2017-06-09 NOTE — BHH Suicide Risk Assessment (Signed)
[facility]   Nursing information obtained from:    Demographic factors:    Current Mental Status:    Loss Factors:    Historical Factors:    Risk Reduction Factors:     Total Time spent with patient: 45 minutes Principal Problem: Psychosis Diagnosis:   Patient Active Problem List   Diagnosis Date Noted  . Schizophrenia, paranoid type (HCC) [F20.0] 06/08/2017  . 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 Paranoid schizophrenia (HCC) [F20.0] 09/29/2015  . 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 History of posttraumatic stress disorder (PTSD) [Z86.59] 09/26/2015   Subjective Data:  - See H&P for full subjective data - Pt is 21 y/o transgender male who presented initially to ED with concern that she had hurt her trachea after eating a pie. She presented with paranoid, grandiose delusions and disorganized thought and speech. She indicated that she had been off of psychotropic medications since August 2018. She was voluntary for admission. She denied SI/HI/AH/VH. She was restarted on haldol 5mg  BID, cogentin 1mg  qhs, trileptal 300mg  BID, and trazodone 50mg  qhs.   Continued Clinical Symptoms:  Alcohol Use Disorder Identification Test Final Score (AUDIT): 0 The "Alcohol Use Disorders Identification Test", Guidelines for Use in Primary Care, Second Edition.  World Science writerHealth Organization [facility](WHO). Score between 0-7:  no or low risk or alcohol related problems. Score between 8-15:  moderate risk of alcohol related problems. Score between 16-19:  high risk of alcohol related problems. Score 20 or above:  warrants further diagnostic evaluation for alcohol dependence and treatment.   CLINICAL FACTORS:   Severe Anxiety and/or Agitation Schizophrenia:   Paranoid or undifferentiated type   Musculoskeletal: Strength & Muscle Tone: within normal limits Gait & Station: normal Patient  leans: N/A  Psychiatric Specialty Exam: Physical Exam  Nursing note and vitals reviewed.   Review of Systems  Constitutional: Negative for chills and fever.  Respiratory: Negative for cough.   Cardiovascular: Negative for chest pain.  Gastrointestinal: Negative for heartburn, nausea and vomiting.    Blood pressure 120/67, pulse 99, temperature 98.3 F (36.8 C), temperature source Oral, resp. rate 16, height 6' (1.829 m), weight 61.7 kg (136 lb).Body mass index is 18.44 kg/m.  General Appearance: Disheveled    Eye Contact:  Fair  Speech:  Slow  Volume:  Decreased  Mood:  Anxious and Depressed  Affect:  Congruent and Flat  Thought Process:  Disorganized  Orientation:  Full (Time, Place, and Person)  Thought Content:  Illogical, Delusions and Ideas of Reference:   Paranoia Delusions  Suicidal Thoughts:  No  Homicidal Thoughts:  No  Memory:  Immediate;   Fair Recent;   Fair Remote;   Fair  Judgement:  Poor  Insight:  Lacking  Psychomotor Activity:  Normal  Concentration:  Concentration: Fair  Recall:  FiservFair  Fund of Knowledge:  Fair  Language:  Fair  Akathisia:  No  Handed:    AIMS (if indicated):     Assets:  Resilience  ADL's:  Intact  Cognition:  WNL  Sleep:  Number of Hours: 4                                                   Sleep:  Number of Hours: 4  COGNITIVE FEATURES THAT CONTRIBUTE TO RISK:  None    SUICIDE RISK:   Minimal: No identifiable suicidal ideation.  Patients presenting with no risk factors but with morbid ruminations; may be classified as minimal risk based on the severity of the depressive symptoms  PLAN OF CARE:  -Admit to inpatient psychiatry unit - Continue current medications as they have been restarted: haldol 5mg  BID, cogentin 1mg  qhs, trileptal 300mg  BID, and trazodone 50mg  qhs - Labs to be ordered: lipid profile, prolactin, TSH - Encourage participation in groups and therapeutic milieu - Discharge  planning will be ongoing   I certify that inpatient services furnished can reasonably be expected to improve the patient's condition.   Micheal Likens, MD 06/09/2017, 6:28 PM

## 2017-06-09 NOTE — Progress Notes (Signed)
Patient ID: Collin White, male   DOB: 07/10/1994, 21 y.o.   MRN: [id number]  Admission Note:  D: [age] yr male who presents VC in no acute distress for the treatment of psychosis/ delusions. Pt appears flat and depressed. Pt was calm and cooperative with admission process. Pt denies SI/ HI/ AVH at this time. Pt presents very delusional and appears to be responding at times, pt has thought blocking some times. Pt presents with flight of ideas and delusional comments at times. Pt stated "I don't have disability, I have 40 million dollars, I can only have medicaid, my family flying in to meet me"  A: Skin was assessed and found to be clear of any abnormal marks apart from a scar on L-temple area. PT searched and no contraband found, POC and unit policies explained and understanding verbalized. Consents obtained. Food and fluids offered, and  Accepted.  R: Pt had no additional questions or concerns.

## 2017-06-09 NOTE — Plan of Care (Signed)
Problem: Safety: Goal: Periods of time without injury will increase Outcome: Progressing No self injurious observed or expressed

## 2017-06-09 NOTE — Progress Notes (Signed)
Recreation Therapy Notes  INPATIENT RECREATION THERAPY ASSESSMENT  Patient Details Name: Collin White MRN: [id number] DOB: 07/10/1994 Today's Date: 06/09/2017  Patient Stressors:  (Pt identified no stressors.)  Pt stated he was here because of his throat and it was hard to swallow.  Coping Skills:   Arguments, Exercise, Art/Dance, Talking, Music, Sports  Pt stated he does martial arts.  Personal Challenges:  (Pt named no persoanl challenges.)  Leisure Interests (2+):  Games - Video games, Individual - Other (Comment) Engineer, site(Shooting pool, roller skating)  Awareness of Community Resources:  Yes  Community Resources:  Library, Public affairs consultantestaurants, [employer]  Current Use: Yes  Patient Strengths:  Depicting drama; ceasing drama  Patient Identified Areas of Improvement:  "I don't know"  Current Recreation Participation:  Everything  Patient Goal for Hospitalization:  "Heal up and leave"  [place]  [place] Residence[place]  Current [place] (including self-harm):  No  Current HI:  No  Consent to Intern Participation: N/A   Caroll RancherMarjette Chanae Gemma, LRT/CTRS  Caroll RancherLindsay, Elizandro Laura A 06/09/2017, 2:54 PM

## 2017-06-09 NOTE — Progress Notes (Signed)
D:Pt has been isolative to his room. He has a flat affect with minimal interaction. As Clinical research associatewriter went by pt's room to check on pt, he was lying in the bed with his hands down his pants. Pt is cautious with interaction and verbally denies hallucinations.  A:Offered support and 15 minute checks. Medications given as ordered. R:Pt denies si and hi. Safety maintained on the unit.

## 2017-06-09 NOTE — Progress Notes (Signed)
Recreation Therapy Notes  Date: 06/09/17 Time: 1000 Location: 500 Hall Dayroom  Group Topic: Decision Making, Communication  Goal Area(s) Addresses:  Patient will identify factors that guided their decision making.  Patient will identify benefit of healthy decision making post d/c.   Intervention:  Choices in a Jar Game  Activity: Choices in a Jar.  LRT read a card from the jar.  Each card gave the patients two options.  Patients would have to tell with scenario they chose and why.   Education: Pharmacist, communityocial Skills, Scientist, physiologicalDecision Making, Discharge Planning   Education Outcome: Acknowledges education  Clinical Observations/Feedback: Pt did not attend group.   Caroll RancherMarjette Shamon Cothran, LRT/CTRS         Caroll RancherLindsay, Selene Peltzer A 06/09/2017 12:15 PM

## 2017-06-09 NOTE — Progress Notes (Signed)
Pt observed walking to the medication window mumbling to himself and laughing out loud while not being engaged with other people.
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
## 2017-06-09 NOTE — Tx Team (Signed)
Initial Treatment Plan 06/09/2017 2:30 AM Collin White ZOX:[id number]    PATIENT STRESSORS: Financial difficulties Medication change or noncompliance   PATIENT STRENGTHS: General fund of knowledge Motivation for treatment/growth   PATIENT IDENTIFIED PROBLEMS: psychosis  delusions  "healing"                 DISCHARGE CRITERIA:  Adequate post-discharge living arrangements Improved stabilization in mood, thinking, and/or behavior Verbal commitment to aftercare and medication compliance  PRELIMINARY DISCHARGE PLAN: Attend aftercare/continuing care group Outpatient therapy  PATIENT/FAMILY INVOLVEMENT: This treatment plan has been presented to and reviewed with the patient, .  The patient and family have been given the opportunity to ask questions and make suggestions.  [name], [name] A, RN 06/09/2017, 2:30 AM

## 2017-06-09 NOTE — BHH Suicide Risk Assessment (Signed)
[name] INPATIENT:  Family/Significant Other Suicide Prevention Education  Suicide Prevention Education:  Patient Refusal for Family/Significant Other Suicide Prevention Education: The patient Collin White has refused to provide written consent for family/significant other to be provided Family/Significant Other Suicide Prevention Education during admission and/or prior to discharge.  Physician notified.  SPE completed with pt, as pt refused to consent to family contact. SPI pamphlet provided to pt and pt was encouraged to share information with support network, ask questions, and talk about any concerns relating to SPE. Pt denies access to guns/firearms and verbalized understanding of information provided. Mobile Crisis information also provided to pt.   Pt did not have any collateral contact information and there are no numbers available in pt chart.   Quintina Hakeem N Smart  LCSW 06/09/2017, 3:08 PM

## 2017-06-09 NOTE — Progress Notes (Signed)
Did not attend group 

## 2017-06-09 NOTE — Tx Team (Signed)
Interdisciplinary Treatment and Diagnostic Plan Update  06/09/2017 Time of Session: 0830AM Collin White MRN: [id number]  Principal Diagnosis: Schizophrenia   Secondary Diagnoses: Active Problems:   Schizophrenia, paranoid type ([place])   Current Medications:  Current Facility-Administered Medications  Medication Dose Route Frequency Provider Last Rate Last Dose  . acetaminophen (TYLENOL) tablet 650 mg  650 mg Oral Q6H PRN Patrecia Pour, NP      . alum & mag hydroxide-simeth (MAALOX/MYLANTA) 200-200-20 MG/5ML suspension 30 mL  30 mL Oral Q4H PRN Patrecia Pour, NP      . benztropine (COGENTIN) tablet 1 mg  1 mg Oral QHS Patrecia Pour, NP      . haloperidol (HALDOL) tablet 5 mg  5 mg Oral BID Patrecia Pour, NP   5 mg at 06/09/17 0809  . magnesium hydroxide (MILK OF MAGNESIA) suspension 30 mL  30 mL Oral Daily PRN Patrecia Pour, NP      . Oxcarbazepine (TRILEPTAL) tablet 300 mg  300 mg Oral BID Patrecia Pour, NP   300 mg at 06/09/17 0809  . traZODone (DESYREL) tablet 50 mg  50 mg Oral QHS Patrecia Pour, NP       PTA Medications: Prescriptions Prior to Admission  Medication Sig Dispense Refill Last Dose  . bacitracin ointment Apply 1 application topically 2 (two) times daily. (Patient not taking: Reported on 06/02/2017) 15 g 0 Not Taking at Unknown time  . benztropine (COGENTIN) 1 MG tablet Take 1 tablet (1 mg total) by mouth 2 (two) times daily. (Patient not taking: Reported on 05/05/2017) 60 tablet 0 Not Taking at Unknown time  . haloperidol (HALDOL) 5 MG tablet Take on tablet (5 mg) in the morning.  Take 2 tabs (10 mg) at bedtime. (Patient not taking: Reported on 01/24/2016) 90 tablet 0 Not Taking at Unknown time  . hydrOXYzine (ATARAX/VISTARIL) 25 MG tablet Take 1 tablet (25 mg total) by mouth 3 (three) times daily as needed for anxiety. (Patient not taking: Reported on 01/24/2016) 30 tablet 0 Not Taking at Unknown time  . ibuprofen (ADVIL,MOTRIN) 200 MG tablet Take 400 mg by  mouth every 6 (six) hours as needed for moderate pain.   Past Month at Unknown time  . ibuprofen (ADVIL,MOTRIN) 800 MG tablet Take 1 tablet (800 mg total) by mouth every 6 (six) hours as needed. 21 tablet 0   . Oxcarbazepine (TRILEPTAL) 300 MG tablet Take 1 tablet (300 mg total) by mouth 2 (two) times daily. (Patient not taking: Reported on 01/24/2016) 60 tablet 0 Not Taking at Unknown time    Patient Stressors: Financial difficulties Medication change or noncompliance  Patient Strengths: Technical sales engineer for treatment/growth  Treatment Modalities: Medication Management, Group therapy, Case management,  1 to 1 session with clinician, Psychoeducation, Recreational therapy.   Physician Treatment Plan for Primary Diagnosis: Schizophrenia   Medication Management: Evaluate patient's response, side effects, and tolerance of medication regimen.  Therapeutic Interventions: 1 to 1 sessions, Unit Group sessions and Medication administration.  Evaluation of Outcomes: Not Met  Physician Treatment Plan for Secondary Diagnosis: Active Problems:   Schizophrenia, paranoid type ([place])   Medication Management: Evaluate patient's response, side effects, and tolerance of medication regimen.  Therapeutic Interventions: 1 to 1 sessions, Unit Group sessions and Medication administration.  Evaluation of Outcomes: Not Met   RN Treatment Plan for Primary Diagnosis: Schizophrenia  Long Term Goal(s): Knowledge of disease and therapeutic regimen to maintain health will improve  Short Term Goals:  Ability to demonstrate self-control, Ability to participate in decision making will improve and Ability to verbalize feelings will improve  Medication Management: RN will administer medications as ordered by provider, will assess and evaluate patient's response and provide education to patient for prescribed medication. RN will report any adverse and/or side effects to prescribing  provider.  Therapeutic Interventions: 1 on 1 counseling sessions, Psychoeducation, Medication administration, Evaluate responses to treatment, Monitor vital signs and CBGs as ordered, Perform/monitor CIWA, COWS, AIMS and Fall Risk screenings as ordered, Perform wound care treatments as ordered.  Evaluation of Outcomes: Not Met   LCSW Treatment Plan for Primary Diagnosis: Schizophrenia  Long Term Goal(s): Safe transition to appropriate next level of care at discharge, Engage patient in therapeutic group addressing interpersonal concerns.  Short Term Goals: Engage patient in aftercare planning with referrals and resources, Increase social support and Increase skills for wellness and recovery  Therapeutic Interventions: Assess for all discharge needs, 1 to 1 time with Social worker, Explore available resources and support systems, Assess for adequacy in community support network, Educate family and significant other(s) on suicide prevention, Complete Psychosocial Assessment, Interpersonal group therapy.  Evaluation of Outcomes: Not Met   Progress in Treatment: Attending groups: No. Pt is isolating in room.  Participating in groups: No. Taking medication as prescribed: Yes. Toleration medication: Yes. Family/Significant other contact made: SPE completed with pt; collateral information would be helpful but pt does not have any collateral contact information, nor are there any contacts in his chart.  Patient understands diagnosis: No. lacking insight; psychotic  Discussing patient identified problems/goals with staff: Yes. Medical problems stabilized or resolved: Yes. Denies suicidal/homicidal ideation: Yes. Issues/concerns per patient self-inventory: No. Other: n/a   New problem(s) identified: Yes, Describe:  poor historian due to psychosis--no collateral contacts. difficult to obtain accurate information.  New Short Term/Long Term Goal(s): medication management for elimination/reduction  of psychotic symptoms; development of comprehensive mental wellness plan.   Discharge Plan or Barriers: Pt plans to resume care on an outpatient basis at [name]. Unsure of living situation--pt may be homeless. States that he owns three homes. CSW continuing to assess.   Reason for Continuation of Hospitalization: Delusions  Hallucinations Medication stabilization Other; describe symptoms of psychosis  Estimated Length of Stay: Monday 06/15/17  Attendees: Patient: 06/09/2017 3:13 PM  Physician: Dr. Nancy Fetter MD 06/09/2017 3:13 PM  Nursing: Roosvelt Maser RN 06/09/2017 3:13 PM  RN Care Manager: Lars Pinks CM 06/09/2017 3:13 PM  Social Worker: Maxie Better, LCSW; Stacy LCSW 06/09/2017 3:13 PM  Recreational Therapist: x 06/09/2017 3:13 PM  Other: Lindell Spar NP; Darnelle Maffucci Money NP 06/09/2017 3:13 PM  Other:  06/09/2017 3:13 PM  Other: 06/09/2017 3:13 PM    Scribe for Treatment Team: [place], LCSW 06/09/2017 3:13 PM

## 2017-06-10 LAB — LIPID PANEL
CHOL/HDL RATIO: 3.4 ratio
CHOLESTEROL: 158 mg/dL (ref 0–200)
HDL: 47 mg/dL (ref >40)
LDL Cholesterol: 61 mg/dL (ref 0–99)
Triglycerides: 252 mg/dL — ABNORMAL HIGH (ref <150)
VLDL: 50 mg/dL — AB (ref 0–40)

## 2017-06-10 LAB — TSH: TSH: 1.526 u[IU]/mL (ref 0.350–4.500)

## 2017-06-10 NOTE — [facility] Group Notes (Signed)
LCSW Group Therapy Note  06/10/2017 1:15pm  Type of Therapy/Topic:  Group Therapy:  Balance in Life  Participation Level:  Active  Description of Group:    This group will address the concept of balance and how it feels and looks when one is unbalanced. Patients will be encouraged to process areas in their lives that are out of balance and identify reasons for remaining unbalanced. Facilitators will guide patients in utilizing problem-solving interventions to address and correct the stressor making their life unbalanced. Understanding and applying boundaries will be explored and addressed for obtaining and maintaining a balanced life. Patients will be encouraged to explore ways to assertively make their unbalanced needs known to significant others in their lives, using other group members and facilitator for support and feedback.  Therapeutic Goals: 1. Patient will identify two or more emotions or situations they have that consume much of in their lives. 2. Patient will identify signs/triggers that life has become out of balance:  3. Patient will identify two ways to set boundaries in order to achieve balance in their lives:  4. Patient will demonstrate ability to communicate their needs through discussion and/or role plays  Summary of Patient Progress:   Collin White attended group and was there the entire time.  When she feels sad she can feel it thoughout her body in her arms, heart and lungs.  She is aware of where she feels emotions in her body.  She often feels like it is hard to breathe.  She uses a meditation technique to help her avoid being sad.  She uses a chant during this meditation to help her focus.  Her mood appeared neutral and calm.  She was able to share her thoughts and emotions clearly.  No signs nor symptoms of psychosis.    Therapeutic Modalities:   Cognitive Behavioral Therapy Solution-Focused Therapy Assertiveness Training  Collin White, Student-Social  Work 06/10/2017 11:45 AM

## 2017-06-10 NOTE — Plan of Care (Signed)
Problem: Nutritional: Goal: Ability to achieve adequate nutritional intake will improve Outcome: Progressing Pt tolerated all meals and fluids well when offered thus far this shift.

## 2017-06-10 NOTE — Progress Notes (Signed)
Patient ID: Collin White, male   DOB: 07/10/1994, 21 y.o.   MRN: [id number]  Pt currently presents with a blunted affect and guarded behavior. During interaction, pt is proccupied with her gender identity. When asked if the patient needed anything from writer, pt states "for you to recognize that I am a woman." Reports that she will be having "[facility]." Pt presents with grandiose thoughts during group tonight, per MHT. Pt reports good sleep. Adheres to medication regimen.   Pt addressed using feminine pronouns and called by the name "Collin White." Pt provided with medications per providers orders. Pt's labs and vitals were monitored throughout the night. Pt given a 1:1 about emotional and mental status. Pt supported and encouraged to express concerns and questions.  Pt's safety ensured with 15 minute and environmental checks. Pt currently denies SI/HI and A/V hallucinations. Pt verbally agrees to seek staff if SI/HI or A/VH occurs and to consult with staff before acting on any harmful thoughts. Will continue POC.

## 2017-06-10 NOTE — Progress Notes (Signed)
[facility]  06/10/2017 1:17 PM Collin White  MRN:  [id number] Subjective:   "I'm doing good."  Pt evaluated and chart reviewed. Collin White reports that overall she is doing well. Her sleep has been good. Her appetite is adequate. She has no physical complaints today including no concerns regarding her trachea, GI tract, or pain complaints. Pt denies SI/HI/AH/VH. She is alert and oriented, and able to give accurate account of her initial presentation to ED with concern that her trachea had been injured after eating. However, now pt does not have any physical complaints. She remains grandiose and delusional, stating, "I've been playing in a lot of movies lately." Pt also states, "I had a lot of moms that had to give birth to me - six moms." Pt denies any concerns about her current symptoms, and she explains that her plan is to possibly stay with family after the hospital or to stay in one of her "houses." She is tolerating her medication regimen without difficulty. We discussed continuing her current treatment regimen without changes and pt was in agreement. She had no further questions, comments, or concerns.   Principal Problem: schizophrenia, paranoid type Diagnosis:   Patient Active Problem List   Diagnosis Date Noted  . Schizophrenia, paranoid type (HCC) [F20.0] 06/08/2017  . 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 Paranoid schizophrenia (HCC) [F20.0] 09/29/2015  . 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 History of posttraumatic stress disorder (PTSD) [Z86.59] 09/26/2015   Total Time spent with patient: 30 minutes  Past Psychiatric History: see H&P  Past Medical History:  Past Medical History:  Diagnosis Date  . Psychoses (HCC)   . Schizophrenia (HCC)   . Seizures (HCC)     Past Surgical History:  Procedure Laterality Date  . abd surgery s/p traumatic event    . facial reconstructive  surgery    . NO PAST SURGERIES  patient stated he had facial reconstruction surgery as a child   . 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 Hypertension Other   . Mental illness Neg Hx    Family Psychiatric  History: see H&P Social History:  History  Alcohol Use No     History  Drug Use No    Social History   Social History  . Marital status: Single    Spouse name: N/A  . Number of children: N/A  . Years of education: N/A   Social History Main Topics  . Smoking status: Former Smoker    Types: Cigarettes  . Smokeless tobacco: Never Used  . Alcohol use No  . Drug use: No  . Sexual activity: Yes    Birth control/ protection: Condom   Other Topics Concern  . None   Social History Narrative   ** Merged History Encounter **       ** Merged History Encounter **       Additional Social History:                         Sleep: Good  Appetite:  Good  Current Medications: Current Facility-Administered Medications  Medication Dose Route Frequency Provider Last Rate Last Dose  . acetaminophen (TYLENOL) tablet 650 mg  650 mg Oral Q6H PRN Charm RingsLord, Jamison Y, NP      . alum & mag hydroxide-simeth (MAALOX/MYLANTA) 200-200-20 MG/5ML  suspension 30 mL  30 mL Oral Q4H PRN Charm Rings, NP      . benztropine (COGENTIN) tablet 1 mg  1 mg Oral QHS Charm Rings, NP   1 mg at 06/09/17 2259  . haloperidol (HALDOL) tablet 5 mg  5 mg Oral BID Charm Rings, NP   5 mg at 06/10/17 4132  . magnesium hydroxide (MILK OF MAGNESIA) suspension 30 mL  30 mL Oral Daily PRN Charm Rings, NP      . Oxcarbazepine (TRILEPTAL) tablet 300 mg  300 mg Oral BID Charm Rings, NP   300 mg at 06/10/17 4401  . traZODone (DESYREL) tablet 50 mg  50 mg Oral QHS Charm Rings, NP   50 mg at 06/09/17 2259    Lab Results:  Results for orders placed or performed during the hospital encounter of 06/08/17 (from the past 48 hour(s))   Lipid panel     Status: Abnormal   Collection Time: 06/10/17  6:34 AM  Result Value Ref Range   Cholesterol 158 0 - 200 mg/dL   Triglycerides 027 (H) <150 mg/dL   HDL 47 >25 mg/dL   Total CHOL/HDL Ratio 3.4 RATIO   VLDL 50 (H) 0 - 40 mg/dL   LDL Cholesterol 61 0 - 99 mg/dL    Comment:        Total Cholesterol/HDL:CHD Risk Coronary Heart Disease Risk Table                     Men   Women  1/2 Average Risk   3.4   3.3  Average Risk       5.0   4.4  2 X Average Risk   9.6   7.1  3 X Average Risk  23.4   11.0        Use the calculated Patient Ratio above and the CHD Risk Table to determine the patient's CHD Risk.        ATP III CLASSIFICATION (LDL):  <100     mg/dL   Optimal  366-440  mg/dL   Near or Above                    Optimal  130-159  mg/dL   Borderline  347-425  mg/dL   High  >956     mg/dL   Very High Performed at [facility], [address] [place], [place] [address]   TSH     Status: None   Collection Time: 06/10/17  6:34 AM  Result Value Ref Range   TSH 1.526 0.350 - 4.500 uIU/mL    Comment: Performed by a 3rd Generation assay with a functional sensitivity of <=0.01 uIU/mL. Performed at [facility], [address] [place], [place] [address]     Blood Alcohol level:  Lab Results  Component Value Date   [facility] <10 06/06/2017   ETH <5 01/24/2016    Metabolic Disorder Labs: Lab Results  Component Value Date   HGBA1C 5.6 09/27/2015   MPG 114 09/27/2015   Lab Results  Component Value Date   PROLACTIN 32.6 (H) 09/27/2015   Lab Results  Component Value Date   CHOL 158 06/10/2017   TRIG 252 (H) 06/10/2017   HDL 47 06/10/2017   CHOLHDL 3.4 06/10/2017   VLDL 50 (H) 06/10/2017   LDLCALC 61 06/10/2017   LDLCALC 76 09/27/2015    Physical Findings: AIMS: Facial and Oral Movements Muscles  of Facial Expression: None, normal Lips and Perioral Area: None, normal Jaw: None, normal Tongue: None, normal,Extremity Movements Upper  (arms, wrists, hands, fingers): None, normal Lower (legs, knees, ankles, toes): None, normal, Trunk Movements Neck, shoulders, hips: None, normal, Overall Severity Severity of abnormal movements (highest score from questions above): None, normal Incapacitation due to abnormal movements: None, normal Patient's awareness of abnormal movements (rate only patient's report): No Awareness, Dental Status Current problems with teeth and/or dentures?: No Does patient usually wear dentures?: No  CIWA:    COWS:     Musculoskeletal: Strength & Muscle Tone: within normal limits Gait & Station: normal Patient leans: N/A  Psychiatric Specialty Exam: Physical Exam  Nursing note and vitals reviewed.   Review of Systems  Constitutional: Negative for chills and fever.  HENT: Negative for sore throat.   Respiratory: Negative for cough, shortness of breath and wheezing.   Cardiovascular: Negative for chest pain.  Gastrointestinal: Negative for abdominal pain, constipation, diarrhea, heartburn, nausea and vomiting.  Psychiatric/Behavioral: Negative for depression, hallucinations and suicidal ideas.    Blood pressure 102/62, pulse 99, temperature 98.2 F (36.8 C), temperature source Oral, resp. rate 16, height 6' (1.829 m), weight 61.7 kg (136 lb).Body mass index is 18.44 kg/m.  General Appearance: Casual and Fairly Groomed  Eye Contact:  Fair  Speech:  Normal Rate  Volume:  Decreased  Mood:  Anxious and Euthymic  Affect:  Congruent, Constricted and Flat  Thought Process:  Goal Directed and Descriptions of Associations: Loose  Orientation:  Full (Time, Place, and Person)  Thought Content:  Delusions  Suicidal Thoughts:  No  Homicidal Thoughts:  No  Memory:  Immediate;   Fair Recent;   Fair Remote;   Fair  Judgement:  Fair  Insight:  Lacking  Psychomotor Activity:  Normal  Concentration:  Concentration: Fair  Recall:  [employer] of Knowledge:  Fair  Language:  Fair  Akathisia:  No   Handed:    AIMS (if indicated):     Assets:  Communication Skills Desire for Improvement Resilience Social Support  ADL's:  Intact  Cognition:  WNL  Sleep:  Number of Hours: 6.75     Treatment Plan Summary: Daily contact with patient to assess and evaluate symptoms and progress in treatment and Medication management. Pt reports improvement of mood and psychotic symptoms. She denies any physical complaints today. She has ongoing grandiose delusions. She has been adherent to her treatment regimen.  - Continue haldol 5mg  po BID - Continue trileptal 300mg  BID - Continue trazodone 50mg  qhs - Continue cogentin 1mg  qhs - Encourage participation in groups and therapeutic milieu - Discharge planning will be ongoing.  Micheal Likens, MD 06/10/2017, 1:17 PM

## 2017-06-10 NOTE — Progress Notes (Signed)
Recreation Therapy Notes  Date: 06/10/17 Time: 1000 Location: 500 Hall Dayroom  Group Topic: Stress Management  Goal Area(s) Addresses:  Patient will verbalize importance of using healthy stress management.  Patient will identify positive emotions associated with healthy stress management.   Intervention: Holiday representativeConstruction paper, markers  Activity :  Personalized Plates.  Patients were to create a personalized plate where they were to highlight the positive things about themselves.  Patients could highlight things such as biggest accomplishment, important dates, favorite foods, etc.  Education:  Stress Management, Discharge Planning.   Education Outcome: Acknowledges edcuation/In group clarification offered/Needs additional education  Clinical Observations/Feedback:  Pt did not attend group.   Caroll RancherMarjette Yeilyn Gent, LRT/CTRS         Caroll RancherLindsay, Arnisha Laffoon A 06/10/2017 12:31 PM

## 2017-06-10 NOTE — Progress Notes (Signed)
Patient attended group and was a little confused. He wish to be called Collin White instead of [name].  He said that his day was a 8.  His coping skills were writing songs and travelling the world.

## 2017-06-10 NOTE — Progress Notes (Signed)
Pt visible in milieu majority of this shift. Presents guarded with flat affect. Observed standing in dayroom, staring at wall and talking to self at intervals. Remains delusional with grandiosity. Continues to believe she does have lots of money and owned properties [place] in [place]. Pt did not attend group this AM as encouraged. A: Scheduled medications administered as prescribed with verbal education and effects monitored.  Encouraged pt to voice concerns and comply with treatment regimen including groups. Routine safety checks maintained without self harm gestures or outburst to note at this time. R: Pt has been receptive to care. Compliant with medications when offered. Denies adverse drug reactions. Off unit for meals and recreational activity with peers, returned to unit without issues. POC remains effective for mood stability and safety.

## 2017-06-10 NOTE — Progress Notes (Signed)
Patient ID: Collin White, male   DOB: 07/10/1994, 21 y.o.   MRN: [id number]  D: Patient has been isolative to his room most of the evening. Pt observed talking to self on approach. Pt mood / affect is guarded and flat.  Pt denies SI/HI and pain. Cooperative with assessment. No acute distressed noted at this time.   A: Medications administered as prescribed. Support and encouragement provided  to attend groups and engage in milieu. Pt encouraged to discuss feelings and come to staff with any question or concerns.   R: Patient remains safe and complaint with medications.

## 2017-06-11 LAB — HEMOGLOBIN A1C
Hgb A1c MFr Bld: 5.2 % (ref 4.8–5.6)
Mean Plasma Glucose: 103 mg/dL

## 2017-06-11 LAB — PROLACTIN: Prolactin: 32 ng/mL — ABNORMAL HIGH (ref 4.0–15.2)

## 2017-06-11 NOTE — Progress Notes (Signed)
  DATA ACTION RESPONSE  Objective- Pt. is visible in the dayroom, seen watching TV quietly.Presents with an anxious affect and mood. Remains guarded/cautious on approach. No further c/o. No abnormal s/s.   Subjective- Denies having any SI/HI/AVH/Pain at this time. Is cooperative and remains safe on the unit.  1:1 interaction in private to establish rapport. Encouragement, education, & support given from staff.  .   Safety maintained with Q 15 checks. Continue with POC.

## 2017-06-11 NOTE — Progress Notes (Signed)
Recreation Therapy Notes  Date: 06/11/17 Time: 1000 Location: 500 Hall Dayroom  Group Topic: Life Goals, Goal Setting  Goal Area(s) Addresses:  Patient will be able to identify at least 3 life goals.  Patient will be able to identify benefit of investing in life goals.  Patient will be able to identify benefit of setting life goals.   Behavioral Response:  None  Intervention: Goal Planning Worksheet  Activity: Goal Planning.  Patients were given a worksheet in which they had to come up with a goal for next week, next month, next year and in five years.  Patients were to then identify the obstacles to reaching those goals, what they need to achieve their goals and what they can begin doing to work towards their goals.  Education:  Discharge Planning, Coping Skills, Life Goals  Education Outcome: Acknowledges Education/In Group Clarification Provided/Needs Additional Education  Clinical Observations:  Pt did not participate.  Pt stated he [place] [address] [place].  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  Pt was delusional and couldn't get on topic.   Caroll RancherMarjette Estelene Carmack, LRT/CTRS       Caroll RancherLindsay, Lezlie Ritchey A 06/11/2017 11:23 AM

## 2017-06-11 NOTE — Plan of Care (Signed)
Problem: Safety: Goal: Ability to remain free from injury will improve Outcome: Progressing Pt remains safe tonight

## 2017-06-11 NOTE — Plan of Care (Signed)
Problem: Safety: Goal: Periods of time without injury will increase Outcome: Progressing Patient is safe and free from injury.  Routine safety checks maintained every 15 minutes.   

## 2017-06-11 NOTE — Progress Notes (Signed)
Patient ID: Collin White, male   DOB: 07/10/1994, 21 y.o.   MRN: [id number] [facility] MD Progress Note  06/11/2017 4:27 PM Caylor Tallarico  MRN:  [id number] Subjective:   "I'm good today."  Pt evaluated and chart reviewed. [name] reports that she is doing well overall in terms of mood and psychotic symptoms. She is sleeping well. Her appetite is good. She continues to have no physical complaints today including no concerns regarding her trachea, GI tract, or pain complaints. Pt denies SI/HI/AH/VH. She is alert and oriented, and able to give accurate account of her initial presentation to ED, but she remains to be a poor historian with regards to her social history in the recent weeks. Pt states that she was staying with a cousin named "Alison Stalling," but she is unsure about how to contact Ardoch. She has some grandiose delusional content that she owns multiple houses which she sometimes stays at as well. Pt is somewhat disorganized stating that she has phone numbers on her phone which is at the mall and then pt provided circumstantial response that her phone is with [employer]. Pt denies any recent illicit substance use. Discussed with patient that she previously had followed up with [facility] after last discharge, but pt was unsure when she stopped going to [place], but she was in agreement for [place] to look into finding an alternative collateral information source from [employer]. Pt reports she is tolerating her medications without difficulty. She had no further questions, comments, or concerns.   Principal Problem: schizophrenia, paranoid type Diagnosis:   Patient Active Problem List   Diagnosis Date Noted  . Schizophrenia, paranoid type (HCC) [F20.0] 06/08/2017  . 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 Paranoid schizophrenia (HCC) [F20.0] 09/29/2015  . 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 History of posttraumatic stress  disorder (PTSD) [Z86.59] 09/26/2015   Total Time spent with patient: 30 minutes  Past Psychiatric History: see H&P  Past Medical History:  Past Medical History:  Diagnosis Date  . Psychoses (HCC)   . Schizophrenia (HCC)   . Seizures (HCC)     Past Surgical History:  Procedure Laterality Date  . abd surgery s/p traumatic event    . facial reconstructive surgery    . NO PAST SURGERIES  patient stated he had facial reconstruction surgery as a child   . 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 Hypertension Other   . Mental illness Neg Hx    Family Psychiatric  History: see H&P Social History:  History  Alcohol Use No     History  Drug Use No    Social History   Social History  . Marital status: Single    Spouse name: N/A  . Number of children: N/A  . Years of education: N/A   Social History Main Topics  . Smoking status: Former Smoker    Types: Cigarettes  . Smokeless tobacco: Never Used  . Alcohol use No  . Drug use: No  . Sexual activity: Yes    Birth control/ protection: Condom   Other Topics Concern  . None   Social History Narrative   ** Merged History Encounter **       ** Merged History Encounter **       Additional Social History:  Sleep: Good  Appetite:  Good  Current Medications: Current Facility-Administered Medications  Medication Dose Route Frequency Provider Last Rate Last Dose  . acetaminophen (TYLENOL) tablet 650 mg  650 mg Oral Q6H PRN Charm Rings, NP      . alum & mag hydroxide-simeth (MAALOX/MYLANTA) 200-200-20 MG/5ML suspension 30 mL  30 mL Oral Q4H PRN Charm Rings, NP      . benztropine (COGENTIN) tablet 1 mg  1 mg Oral QHS Charm Rings, NP   1 mg at 06/10/17 2142  . haloperidol (HALDOL) tablet 5 mg  5 mg Oral BID Charm Rings, NP   5 mg at 06/11/17 1610  . magnesium hydroxide (MILK OF MAGNESIA) suspension 30 mL  30 mL Oral  Daily PRN Charm Rings, NP      . Oxcarbazepine (TRILEPTAL) tablet 300 mg  300 mg Oral BID Charm Rings, NP   300 mg at 06/11/17 9604  . traZODone (DESYREL) tablet 50 mg  50 mg Oral QHS Charm Rings, NP   50 mg at 06/10/17 2142    Lab Results:  Results for orders placed or performed during the hospital encounter of 06/08/17 (from the past 48 hour(s))  Lipid panel     Status: Abnormal   Collection Time: 06/10/17  6:34 AM  Result Value Ref Range   Cholesterol 158 0 - 200 mg/dL   Triglycerides 540 (H) <150 mg/dL   HDL 47 >98 mg/dL   Total CHOL/HDL Ratio 3.4 RATIO   VLDL 50 (H) 0 - 40 mg/dL   LDL Cholesterol 61 0 - 99 mg/dL    Comment:        Total Cholesterol/HDL:CHD Risk Coronary Heart Disease Risk Table                     Men   Women  1/2 Average Risk   3.4   3.3  Average Risk       5.0   4.4  2 X Average Risk   9.6   7.1  3 X Average Risk  23.4   11.0        Use the calculated Patient Ratio above and the CHD Risk Table to determine the patient's CHD Risk.        ATP III CLASSIFICATION (LDL):  <100     mg/dL   Optimal  119-147  mg/dL   Near or Above                    Optimal  130-159  mg/dL   Borderline  829-562  mg/dL   High  >130     mg/dL   Very High Performed at [facility], [address], [place], [place] [address]   Prolactin     Status: Abnormal   Collection Time: 06/10/17  6:34 AM  Result Value Ref Range   Prolactin 32.0 (H) 4.0 - 15.2 ng/mL    Comment: (NOTE) Performed At: [facility] [address] [place], [place] [id number] Mila Homer MD [id number] Performed at [name], [address] [place], [place] [address]   TSH     Status: None   Collection Time: 06/10/17  6:34 AM  Result Value Ref Range   TSH 1.526 0.350 - 4.500 uIU/mL    Comment: Performed by a 3rd Generation assay with a functional sensitivity of <=0.01 uIU/mL. Performed at [facility], [address]  [place],  [place] [id number]   Hemoglobin A1c     Status: None   Collection Time: 06/10/17  6:34 AM  Result Value Ref Range   Hgb A1c MFr Bld 5.2 4.8 - 5.6 %    Comment: (NOTE)         Prediabetes: 5.7 - 6.4         Diabetes: >6.4         Glycemic control for adults with diabetes: <7.0    Mean Plasma Glucose 103 mg/dL    Comment: (NOTE) Performed At: [facility] [address] [place], [place] [id number] Mila HomerHancock William F MD [phone] Performed at [facility], [address] [place], [place] [address]     Blood Alcohol level:  Lab Results  Component Value Date   [facility] <10 06/06/2017   ETH <5 01/24/2016    Metabolic Disorder Labs: Lab Results  Component Value Date   HGBA1C 5.2 06/10/2017   MPG 103 06/10/2017   MPG 114 09/27/2015   Lab Results  Component Value Date   PROLACTIN 32.0 (H) 06/10/2017   PROLACTIN 32.6 (H) 09/27/2015   Lab Results  Component Value Date   CHOL 158 06/10/2017   TRIG 252 (H) 06/10/2017   HDL 47 06/10/2017   CHOLHDL 3.4 06/10/2017   VLDL 50 (H) 06/10/2017   LDLCALC 61 06/10/2017   LDLCALC 76 09/27/2015    Physical Findings: AIMS: Facial and Oral Movements Muscles of Facial Expression: None, normal Lips and Perioral Area: None, normal Jaw: None, normal Tongue: None, normal,Extremity Movements Upper (arms, wrists, hands, fingers): None, normal Lower (legs, knees, ankles, toes): None, normal, Trunk Movements Neck, shoulders, hips: None, normal, Overall Severity Severity of abnormal movements (highest score from questions above): None, normal Incapacitation due to abnormal movements: None, normal Patient's awareness of abnormal movements (rate only patient's report): No Awareness, Dental Status Current problems with teeth and/or dentures?: No Does patient usually wear dentures?: No  CIWA:    COWS:     Musculoskeletal: Strength & Muscle Tone: within normal limits Gait & Station: normal Patient leans:  N/A  Psychiatric Specialty Exam: Physical Exam  Nursing note and vitals reviewed.   Review of Systems  Constitutional: Negative for chills and fever.  HENT: Negative for sore throat.   Respiratory: Negative for cough, shortness of breath and wheezing.   Cardiovascular: Negative for chest pain.  Gastrointestinal: Negative for abdominal pain, constipation, diarrhea, heartburn, nausea and vomiting.  Psychiatric/Behavioral: Negative for depression, hallucinations and suicidal ideas.    Blood pressure 102/62, pulse 99, temperature 98.2 F (36.8 C), temperature source Oral, resp. rate 16, height 6' (1.829 m), weight 61.7 kg (136 lb).Body mass index is 18.44 kg/m.  General Appearance: Casual and Fairly Groomed  Eye Contact:  Fair  Speech:  Normal Rate  Volume:  Decreased  Mood:  Euthymic  Affect:  Congruent and Flat  Thought Process:  Goal Directed and Descriptions of Associations: Circumstantial, grandiose  Orientation:  Full (Time, Place, and Person)  Thought Content:  Delusions  Suicidal Thoughts:  No  Homicidal Thoughts:  No  Memory:  Immediate;   Fair Recent;   Fair Remote;   Fair  Judgement:  Fair  Insight:  Lacking  Psychomotor Activity:  Normal  Concentration:  Concentration: Fair  Recall:  [employer]Fair  Fund of Knowledge:  Fair  Language:  Fair  Akathisia:  No  Handed:    AIMS (if indicated):     Assets:  Communication Skills Desire for Improvement Resilience Social Support  ADL's:  Intact  Cognition:  WNL  Sleep:  Number of Hours: 6.75     Treatment Plan Summary: Daily contact with patient to assess and evaluate symptoms and progress in treatment and Medication management. Pt has continued improvement of mood and psychotic symptoms. She denies any physical complaints today. She has ongoing grandiose delusions. She has been adherent to her treatment regimen. She is a poor historian regarding social history prior to coming to the hospital and we will attempt to obtain  additional information prior to discharge.  - Continue haldol 5mg  po BID - Continue trileptal 300mg  BID - Continue trazodone 50mg  qhs - Continue cogentin 1mg  qhs - Encourage participation in groups and therapeutic milieu - Increase collateral information - Discharge planning will be ongoing.  Micheal Likens, MD 06/11/2017, 4:27 PM

## 2017-06-11 NOTE — [facility] Group Notes (Signed)
LCSW Group Therapy 06/11/2017 1:15pm  Type of Therapy and Topic:  Group Therapy:  Change and Accountability  Participation Level:  Did Not Attend  Description of Group In this group, patients discussed power and accountability for change.  The group identified the challenges related to accountability and the difficulty of accepting the outcomes of negative behaviors.  Patients were encouraged to openly discuss a challenge/change they could take responsibility for.  Patients discussed the use of "change talk" and positive thinking as ways to support achievement of personal goals.  The group discussed ways to give support and empowerment to peers.  Therapeutic Goals: 1. Patients will state the relationship between personal power and accountability in the change process 2. Patients will identify the positive and negative consequences of a personal choice they have made 3. Patients will identify one challenge/choice they will take responsibility for making 4. Patients will discuss the role of "change talk" and the impact of positive thinking as it supports successful personal change 5. Patients will verbalize support and affirmation of change efforts in peers  Summary of Patient Progress:    Therapeutic Modalities Solution Focused Brief Therapy Motivational Interviewing Cognitive Behavioral Therapy  Long Brimage M Lanson Randle, Student-Social Work 06/11/2017 1:14 PM  

## 2017-06-11 NOTE — Progress Notes (Signed)
Patient attended group and said that his day was a 7.  Patient was excited because he had assisted staff during the day to writing coping skills on the board.

## 2017-06-11 NOTE — Progress Notes (Signed)
DAR NOTE: Patient presents with flat affect and depressed mood.  Denies pain, auditory and visual hallucinations.  Described energy level as normal and concentration as good.  Rates depression at 0, hopelessness at 0, and anxiety at 0.  Maintained on routine safety checks.  Medications given as prescribed.  Support and encouragement offered as needed.  States goal for today is "to get out of the hospital."  Patient remained withdrawn and isolative to his room.  Minimal interaction with staff.  Offered no complaint.

## 2017-06-12 NOTE — Progress Notes (Signed)
DAR NOTE: Patient presents with flat affect and depressed mood.  Denies auditory and visual hallucinations.  Described energy level as normal and concentration as good.  Rates depression at 0, hopelessness at 0, and anxiety at 0.  Maintained on routine safety checks.  Medications given as prescribed.  Support and encouragement offered as needed.  States goal for today is "getting out of this hospital."  Patient visible in the dayroom watching TV.  No interaction with staff or peers.  Patient safe on the unit.

## 2017-06-12 NOTE — [facility] Group Notes (Signed)
[name] LCSW Group Therapy  06/12/2017  1:05 PM  Type of Therapy:  Group therapy  Participation Level:  Active  Participation Quality:  Attentive  Affect:  Flat  Cognitive:  Oriented  Insight:  Limited  Engagement in Therapy:  Limited  Modes of Intervention:  Discussion, Socialization  Summary of Progress/Problems:  Chaplain was here to lead a group on themes of hope and courage. Invited.  Chose to not attend.  Daryel Geraldorth, Youlanda Tomassetti B 06/12/2017 2:07 PM

## 2017-06-12 NOTE — Progress Notes (Signed)
Recreation Therapy Notes  Date: 06/12/17 Time: 1000 Location:  500 Hall  Group Topic: Communication  Goal Area(s) Addresses:  Patient will effectively communicate with peers in group.  Patient will verbalize benefit of healthy communication. Patient will verbalize positive effect of healthy communication on post d/c goals.  Patient will identify communication techniques that made activity effective for group.   Behavioral Response: Engaged  Intervention:  Occupational psychologistubber discs  Activity: Traffic Jam.  Patients were given one rubber disc each, plus one extra for the group.  Patients were to use the discs to travel as a group from one end of the hall to the other and back.  If any person stepped off their disc, the group would have to start from the beginning.    Education:Communication, Discharge Planning  Education Outcome: Acknowledges understanding/In group clarification offered/Needs additional education.   Clinical Observations/Feedback: Pt helped the group come up with a technique to complete the activity.  Pt was flat but worked well with his peers.  Pt helped his peers get to the other end of the hall but when they had to come back, pt decided he had enough and left the group.     Caroll RancherMarjette Geanie Pacifico, LRT/CTRS        Caroll RancherLindsay, Angelgabriel Willmore A 06/12/2017 12:21 PM

## 2017-06-12 NOTE — Progress Notes (Signed)
Patient attended group and said that his day was a  7. His coping skill was relaxing.

## 2017-06-12 NOTE — Progress Notes (Signed)
Nursing Progress Note: 7p-7a D: Pt currently presents with a grandiose affect and behavior. Pt states "I got millions of dollars bitch." Interacting minimally with the milieu. Pt reports good sleep during the previous night with current medication regimen. Pt did attend wrap-up group.  A: Pt provided with medications per providers orders. Pt's labs and vitals were monitored throughout the night. Pt supported emotionally and encouraged to express concerns and questions. Pt educated on medications.  R: Pt's safety ensured with 15 minute and environmental checks. Pt currently denies SI, HI, and AVH. Pt verbally contracts to seek staff if SI,HI, or AVH occurs and to consult with staff before acting on any harmful thoughts. Will continue to monitor.

## 2017-06-12 NOTE — Progress Notes (Signed)
[facility] MD Progress Note  06/12/2017 4:25 PM Collin White  MRN:  [id number] Subjective:    - Collin White is a 21 y/o transgender male with preferred name of "Collin White" who was admitted with worsening symptoms of psychosis. Today patient reports that she is doing well overall. She had been sleeping in her room and explains, "I had to sleep because I drank that coffee." Pt is somewhat circumstantial and provides vague responses. Pt shares that she was staying between multiple locations prior to coming to the hospital. She endorses ongoing grandiose delusions that she owns multiple houses but she is unable to provide further details about them other than some descriptions of the buildings. Pt is unable to provide any information about her family or other social supports. She denies SI/HI/AH/VH today. She has been tolerating current treatment regimen without difficulty. Pt reports sleep is good and appetite is good overall. Discussed with patient about continuing the current treatment plan with plan to possibly discharge to shelter early next week and pt verbalized good understanding and was in agreement. Pt had no further questions, comments, or concerns.   Principal Problem: Schizophrenia, paranoid type (HCC) Diagnosis:   Patient Active Problem List   Diagnosis Date Noted  . Schizophrenia, paranoid type (HCC) [F20.0] 06/08/2017  . 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 Paranoid schizophrenia (HCC) [F20.0] 09/29/2015  . 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 History of posttraumatic stress disorder (PTSD) [Z86.59] 09/26/2015   Total Time spent with patient: 30 minutes  Past Psychiatric History: see H&P  Past Medical History:  Past Medical History:  Diagnosis Date  . Psychoses (HCC)   . Schizophrenia (HCC)   . Seizures (HCC)     Past Surgical History:  Procedure Laterality Date  . abd surgery s/p  traumatic event    . facial reconstructive surgery    . NO PAST SURGERIES  patient stated he had facial reconstruction surgery as a child   . 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 Hypertension Other   . Mental illness Neg Hx    Family Psychiatric  History: see H&P Social History:  History  Alcohol Use No     History  Drug Use No    Social History   Social History  . Marital status: Single    Spouse name: N/A  . Number of children: N/A  . Years of education: N/A   Social History Main Topics  . Smoking status: Former Smoker    Types: Cigarettes  . Smokeless tobacco: Never Used  . Alcohol use No  . Drug use: No  . Sexual activity: Yes    Birth control/ protection: Condom   Other Topics Concern  . None   Social History Narrative   ** Merged History Encounter **       ** Merged History Encounter **       Additional Social History:                         Sleep: Good  Appetite:  Good  Current Medications: Current Facility-Administered Medications  Medication Dose Route Frequency Provider Last Rate Last Dose  . acetaminophen (TYLENOL) tablet 650 mg  650 mg Oral Q6H PRN Charm Rings, NP   650 mg at 06/12/17 0855  . alum & mag hydroxide-simeth (MAALOX/MYLANTA) 200-200-20 MG/5ML  suspension 30 mL  30 mL Oral Q4H PRN Charm RingsLord, Jamison Y, NP      . benztropine (COGENTIN) tablet 1 mg  1 mg Oral QHS Charm RingsLord, Jamison Y, NP   1 mg at 06/11/17 2105  . haloperidol (HALDOL) tablet 5 mg  5 mg Oral BID Charm RingsLord, Jamison Y, NP   5 mg at 06/12/17 [id number]  . magnesium hydroxide (MILK OF MAGNESIA) suspension 30 mL  30 mL Oral Daily PRN Charm RingsLord, Jamison Y, NP      . Oxcarbazepine (TRILEPTAL) tablet 300 mg  300 mg Oral BID Charm RingsLord, Jamison Y, NP   300 mg at 06/12/17 [id number]  . traZODone (DESYREL) tablet 50 mg  50 mg Oral QHS Charm RingsLord, Jamison Y, NP   50 mg at 06/11/17 2106    Lab Results: No results found for this or any previous visit  (from the past 48 hour(s)).  Blood Alcohol level:  Lab Results  Component Value Date   ETH <10 06/06/2017   ETH <5 01/24/2016    Metabolic Disorder Labs: Lab Results  Component Value Date   HGBA1C 5.2 06/10/2017   MPG 103 06/10/2017   MPG 114 09/27/2015   Lab Results  Component Value Date   PROLACTIN 32.0 (H) 06/10/2017   PROLACTIN 32.6 (H) 09/27/2015   Lab Results  Component Value Date   CHOL 158 06/10/2017   TRIG 252 (H) 06/10/2017   HDL 47 06/10/2017   CHOLHDL 3.4 06/10/2017   VLDL 50 (H) 06/10/2017   LDLCALC 61 06/10/2017   LDLCALC 76 09/27/2015    Physical Findings: AIMS: Facial and Oral Movements Muscles of Facial Expression: None, normal Lips and Perioral Area: None, normal Jaw: None, normal Tongue: None, normal,Extremity Movements Upper (arms, wrists, hands, fingers): None, normal Lower (legs, knees, ankles, toes): None, normal, Trunk Movements Neck, shoulders, hips: None, normal, Overall Severity Severity of abnormal movements (highest score from questions above): None, normal Incapacitation due to abnormal movements: None, normal Patient's awareness of abnormal movements (rate only patient's report): No Awareness, Dental Status Current problems with teeth and/or dentures?: No Does patient usually wear dentures?: No  CIWA:    COWS:     Musculoskeletal: Strength & Muscle Tone: within normal limits Gait & Station: normal Patient leans: N/A  Psychiatric Specialty Exam: Physical Exam  Nursing note and vitals reviewed.   Review of Systems  Constitutional: Negative for chills and fever.  Respiratory: Negative for cough and shortness of breath.   Cardiovascular: Negative for chest pain.  Skin: Negative for rash.  Psychiatric/Behavioral: Negative for depression, hallucinations and suicidal ideas.    Blood pressure (!) 96/49, pulse (!) 108, temperature 98.3 F (36.8 C), resp. rate 16, height 6' (1.829 m), weight 61.7 kg (136 lb).Body mass index is  18.44 kg/m.  General Appearance: Disheveled  Eye Contact:  Fair  Speech:  Slow  Volume:  Decreased  Mood:  Anxious and Euthymic  Affect:  Congruent and Flat  Thought Process:  Disorganized and Descriptions of Associations: Circumstantial  Orientation:  Full (Time, Place, and Person)  Thought Content:  Ideas of Reference:   Delusions  Suicidal Thoughts:  No  Homicidal Thoughts:  No  Memory:  Immediate;   Fair Recent;   Fair Remote;   Fair  Judgement:  Fair  Insight:  Lacking  Psychomotor Activity:  Normal  Concentration:  Concentration: Good  Recall:  [employer]Fair  Fund of Knowledge:  Fair  Language:  Fair  Akathisia:  No  Handed:    AIMS (if indicated):  Assets:  Resilience Social Support  ADL's:  Intact  Cognition:  WNL  Sleep:  Number of Hours: 6.5     Treatment Plan Summary: Daily contact with patient to assess and evaluate symptoms and progress in treatment and Medication management. Pt continues to be delusional and grandiose, but she denies SI/HI/AH/VH. She has been adherent to her treatment regimen. She has not been able to contact any social supports outside the hospital. She agrees to continue the current treatment plan without changes as we attempt to locate a safe location for discharge.  -Schizophrenia  - Continue haldol 5mg  BID  - Continue trileptal 300mg  BID -Insomnia  - Continue trazodone 1mg  qhs -EPS  - Continue cogentin 1mg  qhs -Encourage participation in groups and the therapeutic milieu - Increase collateral informatino - Discharge planning will be ongoing   Micheal Likens, MD 06/12/2017, 4:25 PM

## 2017-06-13 DIAGNOSIS — Z87891 Personal history of nicotine dependence: Secondary | ICD-10-CM

## 2017-06-13 DIAGNOSIS — F29 Unspecified psychosis not due to a substance or known physiological condition: Secondary | ICD-10-CM

## 2017-06-13 DIAGNOSIS — G4700 Insomnia, unspecified: Secondary | ICD-10-CM

## 2017-06-13 NOTE — Progress Notes (Signed)
Patient ID: [name], male   DOB: 07/10/1994, 21 y.o.   MRN: [id number]    D: Pt has been very isolative on the unit today, he remained in his room most of the day. Pt did not engage in any treatment, he did not attend any groups. Pt refused to respond to staff unless he was called "Keyla". Pt believes in his mind that he is a male. Pt reported that his depression was a 0, his hopelessness was a 0, and his anxiety was a 0. Pt reported that his goal was to leave the hospital. Pt reported being negative SI/HI, no AH/VH noted. A: 15 min checks continued for patient safety. R: Pt safety maintained.

## 2017-06-13 NOTE — Progress Notes (Signed)
Patient has been isolative to his room tonight when Clinical research associatewriter spoke with him 1:1. He reports that everything is fine and he denies having pain, -si/hi/a/v hallucinations. Writer informed him of his scheduled medications and encouraged him to come to the medication window. He was compliant with his medications and requested a snack which he received. He reports that he likes to be alone. Support given and safety maintained on unit.

## 2017-06-13 NOTE — Progress Notes (Signed)
Patient attended group and said that his day was a 9. He was excited because he had a voice change.

## 2017-06-13 NOTE — [facility] Group Notes (Signed)
[name] Group Notes: (Clinical Social Work)   06/13/2017      Type of Therapy:  Group Therapy   Participation Level:  Did Not Attend despite MHT prompting   [name], LCSW 06/13/2017, 4:57 PM

## 2017-06-13 NOTE — Progress Notes (Signed)
[facility]  06/13/2017 11:57 AM Collin MoMalcolm White  MRN:  [id number]  Subjective: [name] Gordy Savers(aka Kelay) reports, "I'm doing pretty good"  Objective- Collin White is a 21 y/o transgender male with preferred name of "Collin White" who was admitted with worsening symptoms of psychosis. Today patient reports that she is doing well overall. She had been sleeping in her room and explains, "I had to sleep because I drank that coffee." Pt is somewhat circumstantial and provides vague responses. Pt shares that she was staying between multiple locations prior to coming to the hospital. She endorses ongoing grandiose delusions that she owns multiple houses but she is unable to provide further details about them other than some descriptions of the buildings. Pt is unable to provide any information about her family or other social supports. She denies SI/HI/AH/VH today. She has been tolerating current treatment regimen without difficulty. Pt reports sleep is good and appetite is good overall. Discussed with patient about continuing the current treatment plan with plan to possibly discharge to shelter early next week and pt verbalized good understanding and was in agreement. Pt had no further questions, comments, or concerns. Staff reports no disruptive behavior on the unit. She denies any new issues.   Principal Problem: Schizophrenia, paranoid type (HCC)  Diagnosis:   Patient Active Problem List   Diagnosis Date Noted  . Schizophrenia, paranoid type (HCC) [F20.0] 06/08/2017  . 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 Paranoid schizophrenia (HCC) [F20.0] 09/29/2015  . 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 History of posttraumatic stress disorder (PTSD) [Z86.59] 09/26/2015   Total Time spent with patient: 15 minutes  Past Psychiatric History: See H&P  Past Medical History:  Past Medical History:  Diagnosis Date  .  Psychoses (HCC)   . Schizophrenia (HCC)   . Seizures (HCC)     Past Surgical History:  Procedure Laterality Date  . abd surgery s/p traumatic event    . facial reconstructive surgery    . NO PAST SURGERIES  patient stated he had facial reconstruction surgery as a child   . 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 Hypertension Other   . Mental illness Neg Hx    Family Psychiatric  History: See H&P Social History:  History  Alcohol Use No     History  Drug Use No    Social History   Social History  . Marital status: Single    Spouse name: N/A  . Number of children: N/A  . Years of education: N/A   Social History Main Topics  . Smoking status: Former Smoker    Types: Cigarettes  . Smokeless tobacco: Never Used  . Alcohol use No  . Drug use: No  . Sexual activity: Yes    Birth control/ protection: Condom   Other Topics Concern  . None   Social History Narrative   ** Merged History Encounter **       ** Merged History Encounter **       Additional Social History:   Sleep: Good  Appetite:  Good  Current Medications: Current Facility-Administered Medications  Medication Dose Route Frequency Provider Last Rate Last Dose  . acetaminophen (TYLENOL) tablet 650 mg  650 mg Oral Q6H PRN Charm RingsLord, Jamison Y, NP   650 mg at 06/12/17 0855  . alum & mag hydroxide-simeth (MAALOX/MYLANTA) 200-200-20 MG/5ML suspension  30 mL  30 mL Oral Q4H PRN Charm Rings, NP      . benztropine (COGENTIN) tablet 1 mg  1 mg Oral QHS Charm Rings, NP   1 mg at 06/12/17 2124  . haloperidol (HALDOL) tablet 5 mg  5 mg Oral BID Charm Rings, NP   5 mg at 06/13/17 2440  . magnesium hydroxide (MILK OF MAGNESIA) suspension 30 mL  30 mL Oral Daily PRN Charm Rings, NP      . Oxcarbazepine (TRILEPTAL) tablet 300 mg  300 mg Oral BID Charm Rings, NP   300 mg at 06/13/17 1027  . traZODone (DESYREL) tablet 50 mg  50 mg Oral QHS Charm Rings, NP   50 mg at 06/12/17 2124    Lab Results: No results found for this or any previous visit (from the past 48 hour(s)).  Blood Alcohol level:  Lab Results  Component Value Date   ETH <10 06/06/2017   ETH <5 01/24/2016    Metabolic Disorder Labs: Lab Results  Component Value Date   HGBA1C 5.2 06/10/2017   MPG 103 06/10/2017   MPG 114 09/27/2015   Lab Results  Component Value Date   PROLACTIN 32.0 (H) 06/10/2017   PROLACTIN 32.6 (H) 09/27/2015   Lab Results  Component Value Date   CHOL 158 06/10/2017   TRIG 252 (H) 06/10/2017   HDL 47 06/10/2017   CHOLHDL 3.4 06/10/2017   VLDL 50 (H) 06/10/2017   LDLCALC 61 06/10/2017   LDLCALC 76 09/27/2015   Physical Findings: AIMS: Facial and Oral Movements Muscles of Facial Expression: None, normal Lips and Perioral Area: None, normal Jaw: None, normal Tongue: None, normal,Extremity Movements Upper (arms, wrists, hands, fingers): None, normal Lower (legs, knees, ankles, toes): None, normal, Trunk Movements Neck, shoulders, hips: None, normal, Overall Severity Severity of abnormal movements (highest score from questions above): None, normal Incapacitation due to abnormal movements: None, normal Patient's awareness of abnormal movements (rate only patient's report): No Awareness, Dental Status Current problems with teeth and/or dentures?: No Does patient usually wear dentures?: No  CIWA:    COWS:     Musculoskeletal: Strength & Muscle Tone: within normal limits Gait & Station: normal Patient leans: N/A  Psychiatric Specialty Exam: Physical Exam  Nursing note and vitals reviewed.   Review of Systems  Constitutional: Negative for chills and fever.  Respiratory: Negative for cough and shortness of breath.   Cardiovascular: Negative for chest pain.  Skin: Negative for rash.  Psychiatric/Behavioral: Negative for depression, hallucinations and suicidal ideas.    Blood pressure (!) 118/59, pulse 81, temperature  98.2 F (36.8 C), temperature source Oral, resp. rate 16, height 6' (1.829 m), weight 61.7 kg (136 lb).Body mass index is 18.44 kg/m.  General Appearance: Disheveled  Eye Contact:  Fair  Speech:  Slow  Volume:  Decreased  Mood:  Anxious  Affect:  Flat  Thought Process:  Disorganized and Descriptions of Associations: Circumstantial  Orientation:  Full (Time, Place, and Person)  Thought Content:  Ideas of Reference:   Delusions  Suicidal Thoughts:  Denies any thoughts, plans or intent  Homicidal Thoughts:  Denies  Memory:  Immediate;   Fair Recent;   Fair Remote;   Fair  Judgement:  Fair  Insight:  Lacking  Psychomotor Activity:  Normal  Concentration:  Concentration: Good  Recall:  [employer] of Knowledge:  Fair  Language:  Fair  Akathisia:  No  Handed:    AIMS (if  indicated):     Assets:  Resilience Social Support  ADL's:  Intact  Cognition:  WNL  Sleep:  Number of Hours: 6.75   Treatment Plan Summary: Daily contact with patient to assess and evaluate symptoms and progress in treatment and Medication management. Pt continues to be delusional and grandiose, but she denies SI/HI/AH/VH. She has been adherent to her treatment regimen. She has not been able to contact any social supports outside the hospital. She agrees to continue the current treatment plan without changes as we attempt to locate a safe location for discharge.  -Schizophrenia  - Continue haldol 5mg  BID  - Continue trileptal 300mg  BID -Insomnia  - Continue trazodone 1mg  qhs -EPS  - Continue cogentin 1mg  qhs -Encourage participation in groups and the therapeutic milieu - Increase collateral informatino - Discharge planning will be ongoing  Sanjuana Kava, NP, PMHNP, FNP-BC. 06/13/2017, 11:57 AMPatient ID: Collin Mo, male   DOB: 07/10/1994, 21 y.o.   MRN: [id number]

## 2017-06-14 NOTE — Progress Notes (Signed)
Writer has observed patient up in the dayroom watching tv but no interaction with peers. He took his scheduled medications. Writer inquired about his singing and he hummed us a few lines to a song. He reports that his family sings. He returned to the dayroom and watched more television. Safety maintained on unit with 15 min checks.

## 2017-06-14 NOTE — [facility] Group Notes (Signed)
[name] LCSW Group Therapy Note  Date/Time:  06/14/2017  11:00AM-12:00PM  Type of Therapy and Topic:  Group Therapy:  Music and Mood  Participation Level:  Minimal   Description of Group: In this process group, members listened to a variety of genres of music and identified that different types of music evoke different responses.  Patients were encouraged to identify music that was soothing for them and music that was energizing for them.  Patients discussed how this knowledge can help with wellness and recovery in various ways including managing depression and anxiety as well as encouraging healthy sleep habits.    Therapeutic Goals: 1. Patients will explore the impact of different varieties of music on mood 2. Patients will verbalize the thoughts they have when listening to different types of music 3. Patients will identify music that is soothing to them as well as music that is energizing to them 4. Patients will discuss how to use this knowledge to assist in maintaining wellness and recovery 5. Patients will explore the use of music as a coping skill  Summary of Patient Progress:  At the beginning of group, patient expressed that he will not respond to the name [name] and needs to be called [name].  He said he felt okay at the beginning and by the end of the music said he felt good.  He talked to himself a great deal throughout group.  Therapeutic Modalities: Solution Focused Brief Therapy Motivational Interviewing Activity   [name], LCSW 06/14/2017 12:43 PM

## 2017-06-14 NOTE — Progress Notes (Signed)
[facility] MD Progress Note  06/14/2017 3:49 PM Collin White  MRN:  [id number]  Subjective: [name] [facility]) reports, "I can't take Haldol no more. That Haldol is for seizure & headaches. It is not for concentration. Haldol is for frontal headaches. It does not cause you to snort. I'm done taking it. I looked it up already. You can't tell nothing about haldol no more. The Trileptal is good"  Objective- Collin White is a 21 y/o transgender male with preferred name of "Collin White" who was admitted with worsening symptoms of psychosis. Today patient reports that she is not going to take Haldol any more because it is for frontal headaches & seizure. She had been a lot of time in her room and does not socialize much. But, today, she she is visible on the unit, spending time in the dayroom with other patients. She attended the morning group sessions. She remains somewhat circumstantial and provides vague responses to questions. Pt shared earlier during the week with the attending psychiatrist that she was staying between multiple locations prior to coming to the hospital. She endorses ongoing grandiose delusions that she owns multiple houses but she is unable to provide further details about them other than some descriptions of the buildings. Pt is unable to provide any information about her family or other social supports. She denies SI/HI/AH/VH today. She has been tolerating current treatment regimen without difficulty. Pt reports sleep is good and appetite is good overall. Discussed with patient about continuing the current treatment plan with plan to possibly discharge to shelter early next week and pt verbalized good understanding and was in agreement.  06-14-17; Pt reports only 1 concern, that she will no longer take Haldol. However, staff reports indicated that she did take all her medications this morning. Staff reports no disruptive behavior on the unit. She denies any new issues.  Principal Problem:  Schizophrenia, paranoid type (HCC)  Diagnosis:   Patient Active Problem List   Diagnosis Date Noted  . Schizophrenia, paranoid type (HCC) [F20.0] 06/08/2017  . 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 Paranoid schizophrenia (HCC) [F20.0] 09/29/2015  . 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 History of posttraumatic stress disorder (PTSD) [Z86.59] 09/26/2015   Total Time spent with patient: 15 minutes  Past Psychiatric History: See H&P  Past Medical History:  Past Medical History:  Diagnosis Date  . Psychoses (HCC)   . Schizophrenia (HCC)   . Seizures (HCC)     Past Surgical History:  Procedure Laterality Date  . abd surgery s/p traumatic event    . facial reconstructive surgery    . NO PAST SURGERIES  patient stated he had facial reconstruction surgery as a child   . 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 Hypertension Other   . Mental illness Neg Hx    Family Psychiatric  History: See H&P Social History:  History  Alcohol Use No     History  Drug Use No    Social History   Social History  . Marital status: Single    Spouse name: N/A  . Number of children: N/A  . Years of education: N/A   Social History Main Topics  . Smoking status: Former Smoker    Types: Cigarettes  . Smokeless tobacco: Never Used  . Alcohol use No  . Drug use: No  . Sexual  activity: Yes    Birth control/ protection: Condom   Other Topics Concern  . None   Social History Narrative   ** Merged History Encounter **       ** Merged History Encounter **       Additional Social History:   Sleep: Good  Appetite:  Good  Current Medications: Current Facility-Administered Medications  Medication Dose Route Frequency Provider Last Rate Last Dose  . acetaminophen (TYLENOL) tablet 650 mg  650 mg Oral Q6H PRN Charm RingsLord, Jamison Y, NP   650 mg at  06/12/17 0855  . alum & mag hydroxide-simeth (MAALOX/MYLANTA) 200-200-20 MG/5ML suspension 30 mL  30 mL Oral Q4H PRN Charm RingsLord, Jamison Y, NP      . benztropine (COGENTIN) tablet 1 mg  1 mg Oral QHS Charm RingsLord, Jamison Y, NP   1 mg at 06/13/17 2158  . haloperidol (HALDOL) tablet 5 mg  5 mg Oral BID Armandina StammerNwoko, Tyreesha Maharaj I, NP   5 mg at 06/13/17 1807  . magnesium hydroxide (MILK OF MAGNESIA) suspension 30 mL  30 mL Oral Daily PRN Charm RingsLord, Jamison Y, NP      . Oxcarbazepine (TRILEPTAL) tablet 300 mg  300 mg Oral BID Charm RingsLord, Jamison Y, NP   300 mg at 06/14/17 [id number]  . traZODone (DESYREL) tablet 50 mg  50 mg Oral QHS Charm RingsLord, Jamison Y, NP   50 mg at 06/13/17 2157    Lab Results: No results found for this or any previous visit (from the past 48 hour(s)).  Blood Alcohol level:  Lab Results  Component Value Date   ETH <10 06/06/2017   ETH <5 01/24/2016    Metabolic Disorder Labs: Lab Results  Component Value Date   HGBA1C 5.2 06/10/2017   MPG 103 06/10/2017   MPG 114 09/27/2015   Lab Results  Component Value Date   PROLACTIN 32.0 (H) 06/10/2017   PROLACTIN 32.6 (H) 09/27/2015   Lab Results  Component Value Date   CHOL 158 06/10/2017   TRIG 252 (H) 06/10/2017   HDL 47 06/10/2017   CHOLHDL 3.4 06/10/2017   VLDL 50 (H) 06/10/2017   LDLCALC 61 06/10/2017   LDLCALC 76 09/27/2015   Physical Findings: AIMS: Facial and Oral Movements Muscles of Facial Expression: None, normal Lips and Perioral Area: None, normal Jaw: None, normal Tongue: None, normal,Extremity Movements Upper (arms, wrists, hands, fingers): None, normal Lower (legs, knees, ankles, toes): None, normal, Trunk Movements Neck, shoulders, hips: None, normal, Overall Severity Severity of abnormal movements (highest score from questions above): None, normal Incapacitation due to abnormal movements: None, normal Patient's awareness of abnormal movements (rate only patient's report): No Awareness, Dental Status Current problems with teeth and/or  dentures?: No Does patient usually wear dentures?: No  CIWA:    COWS:     Musculoskeletal: Strength & Muscle Tone: within normal limits Gait & Station: normal Patient leans: N/A  Psychiatric Specialty Exam: Physical Exam  Nursing note and vitals reviewed.   Review of Systems  Constitutional: Negative for chills and fever.  Respiratory: Negative for cough and shortness of breath.   Cardiovascular: Negative for chest pain.  Skin: Negative for rash.  Psychiatric/Behavioral: Negative for depression, hallucinations and suicidal ideas.    Blood pressure 127/71, pulse 89, temperature 98.5 F (36.9 C), temperature source Oral, resp. rate 16, height 6' (1.829 m), weight 61.7 kg (136 lb).Body mass index is 18.44 kg/m.  General Appearance: Disheveled  Eye Contact:  Fair  Speech:  Slow  Volume:  Decreased  Mood:  Anxious  Affect:  Flat  Thought Process:  Disorganized and Descriptions of Associations: Circumstantial  Orientation:  Full (Time, Place, and Person)  Thought Content:  Ideas of Reference:   Delusions  Suicidal Thoughts:  Denies any thoughts, plans or intent  Homicidal Thoughts:  Denies  Memory:  Immediate;   Fair Recent;   Fair Remote;   Fair  Judgement:  Fair  Insight:  Lacking  Psychomotor Activity:  Normal  Concentration:  Concentration: Good  Recall:  [employer] of Knowledge:  Fair  Language:  Fair  Akathisia:  No  Handed:    AIMS (if indicated):     Assets:  Resilience Social Support  ADL's:  Intact  Cognition:  WNL  Sleep:  Number of Hours: 6.75   Treatment Plan Summary: Daily contact with patient to assess and evaluate symptoms and progress in treatment and Medication management. Pt continues to be delusional and grandiose, but she denies SI/HI/AH/VH. She has been adherent to her treatment regimen, although she says she will no longer take Haldol as it is for seizure & frontal headaches, and not for concentartion. She has not been able to contact any social  supports outside the hospital.   Will continue today 06/14/17 plan as below except where it is noted.  -Schizophrenia  - Continue haldol 5mg  BID  - Continue trileptal 300mg  BID -Insomnia  - Continue trazodone 1mg  qhs -EPS  - Continue cogentin 1mg  qhs -Encourage participation in groups and the therapeutic milieu - Increase collateral informatino - Discharge planning will be ongoing  Sanjuana Kava, NP, PMHNP, FNP-BC. 06/14/2017, 3:49 PMPatient ID: Collin White, male   DOB: 07/10/1994, 21 y.o.   MRN: [id number]

## 2017-06-14 NOTE — Progress Notes (Signed)
Patient ID: Collin White, male   DOB: 07/10/1994, 21 y.o.   MRN: [id number]    D: Pt remains very flat and depressed on the unit. Pt also continues to be very isolative, he stays in his room and does not interact with staff or peers. When interacting with patient you must call him "[name]" as he identifies as a male. Pt does take all of his medications without any problems, no issues or concerns noted. Pt reported that his depression was a 0, his hopelessness was a 0, and his anxiety was a 0. Pt reported that his goal for today was to get out of hospital.  reported being negative SI/HI, no AH/VH noted. A: 15 min checks continued for patient safety. R: Pt safety maintained.

## 2017-06-15 NOTE — [facility] Group Notes (Signed)
LCSW Group Therapy Note   06/15/2017 1:15pm   Type of Therapy and Topic:  Group Therapy:  Overcoming Obstacles   Participation Level:  Did Not Attend   Description of Group:    In this group patients will be encouraged to explore what they see as obstacles to their own wellness and recovery. They will be guided to discuss their thoughts, feelings, and behaviors related to these obstacles. The group will process together ways to cope with barriers, with attention given to specific choices patients can make. Each patient will be challenged to identify changes they are motivated to make in order to overcome their obstacles. This group will be process-oriented, with patients participating in exploration of their own experiences as well as giving and receiving support and challenge from other group members.   Therapeutic Goals: 1. Patient will identify personal and current obstacles as they relate to admission. 2. Patient will identify barriers that currently interfere with their wellness or overcoming obstacles.  3. Patient will identify feelings, thought process and behaviors related to these barriers. 4. Patient will identify two changes they are willing to make to overcome these obstacles:      Summary of Patient Progress      Therapeutic Modalities:   Cognitive Behavioral Therapy Solution Focused Therapy Motivational Interviewing Relapse Prevention Therapy  Ida RogueRodney B Shyann Hefner, LCSW 06/15/2017 4:23 PM

## 2017-06-15 NOTE — Progress Notes (Signed)
Recreation Therapy Notes  Date: 06/15/17 Time: 1000 Location: [address] Dayroom  Group Topic: Coping Skills  Goal Area(s) Addresses:  Patient will be able to identify positive coping skills. Patient will be able to identify benefits of using coping skills post d/c.  Intervention: Worksheet  Activity: Mind map.  Patients were given a worksheet of a blank mind map.  LRT and patients filled in the first eight boxes together with family, depression, anxiety, lack of finances, anger, abandonment issues, stress and housing.   Education: PharmacologistCoping Skills, Building control surveyorDischarge Planning.   Education Outcome: Acknowledges understanding/In group clarification offered/Needs additional education.   Clinical Observations/Feedback: Pt did not attend group.   Caroll RancherMarjette Danne Vasek, LRT/CTRS         Caroll RancherLindsay, Mikalah Skyles A 06/15/2017 12:20 PM

## 2017-06-15 NOTE — Progress Notes (Signed)
Adult Psychoeducational Group Note  Date:  06/15/2017 Time:  2:16 AM  Group Topic/Focus:  Wrap-Up Group:   The focus of this group is to help patients review their daily goal of treatment and discuss progress on daily workbooks.  Participation Level:  Active  Participation Quality:  Appropriate  Affect:  Appropriate  Cognitive:  Appropriate  Insight: Appropriate  Engagement in Group:  Engaged  Modes of Intervention:  Discussion  Additional Comments:  Pt stated his goal for today was to talk with his doctor about his discharge plan. Pt stated he accomplished his goal today. Pt rated his overall day a 7 out of 10. Pt stated he attended all groups held today.  Felipa FurnaceChristopher  Tassie Pollett 06/15/2017, 2:16 AM

## 2017-06-15 NOTE — Tx Team (Signed)
Interdisciplinary Treatment and Diagnostic Plan Update  06/15/2017 Time of Session: 0830AM Collin White MRN: [id number]  Principal Diagnosis: Schizophrenia   Secondary Diagnoses: Principal Problem:   Schizophrenia, paranoid type ([place])   Current Medications:  Current Facility-Administered Medications  Medication Dose Route Frequency Provider Last Rate Last Dose  . acetaminophen (TYLENOL) tablet 650 mg  650 mg Oral Q6H PRN Patrecia Pour, NP   650 mg at 06/12/17 0855  . alum & mag hydroxide-simeth (MAALOX/MYLANTA) 200-200-20 MG/5ML suspension 30 mL  30 mL Oral Q4H PRN Patrecia Pour, NP      . benztropine (COGENTIN) tablet 1 mg  1 mg Oral QHS Patrecia Pour, NP   1 mg at 06/14/17 2137  . haloperidol (HALDOL) tablet 5 mg  5 mg Oral BID Lindell Spar I, NP   5 mg at 06/15/17 9563  . magnesium hydroxide (MILK OF MAGNESIA) suspension 30 mL  30 mL Oral Daily PRN Patrecia Pour, NP      . Oxcarbazepine (TRILEPTAL) tablet 300 mg  300 mg Oral BID Patrecia Pour, NP   300 mg at 06/15/17 8756  . traZODone (DESYREL) tablet 50 mg  50 mg Oral QHS Patrecia Pour, NP   50 mg at 06/14/17 2137   PTA Medications: Prescriptions Prior to Admission  Medication Sig Dispense Refill Last Dose  . bacitracin ointment Apply 1 application topically 2 (two) times daily. (Patient not taking: Reported on 06/02/2017) 15 g 0 Not Taking at Unknown time  . benztropine (COGENTIN) 1 MG tablet Take 1 tablet (1 mg total) by mouth 2 (two) times daily. (Patient not taking: Reported on 05/05/2017) 60 tablet 0 Not Taking at Unknown time  . haloperidol (HALDOL) 5 MG tablet Take on tablet (5 mg) in the morning.  Take 2 tabs (10 mg) at bedtime. (Patient not taking: Reported on 01/24/2016) 90 tablet 0 Not Taking at Unknown time  . hydrOXYzine (ATARAX/VISTARIL) 25 MG tablet Take 1 tablet (25 mg total) by mouth 3 (three) times daily as needed for anxiety. (Patient not taking: Reported on 01/24/2016) 30 tablet 0 Not Taking at Unknown  time  . ibuprofen (ADVIL,MOTRIN) 200 MG tablet Take 400 mg by mouth every 6 (six) hours as needed for moderate pain.   Past Month at Unknown time  . ibuprofen (ADVIL,MOTRIN) 800 MG tablet Take 1 tablet (800 mg total) by mouth every 6 (six) hours as needed. 21 tablet 0   . Oxcarbazepine (TRILEPTAL) 300 MG tablet Take 1 tablet (300 mg total) by mouth 2 (two) times daily. (Patient not taking: Reported on 01/24/2016) 60 tablet 0 Not Taking at Unknown time    Patient Stressors: Financial difficulties Medication change or noncompliance  Patient Strengths: Technical sales engineer for treatment/growth  Treatment Modalities: Medication Management, Group therapy, Case management,  1 to 1 session with clinician, Psychoeducation, Recreational therapy.   Physician Treatment Plan for Primary Diagnosis: Schizophrenia   Medication Management: Evaluate patient's response, side effects, and tolerance of medication regimen.  Therapeutic Interventions: 1 to 1 sessions, Unit Group sessions and Medication administration.  Evaluation of Outcomes: Adequate for Discharge   10/29:  Pt continues to be delusional and grandiose, but she denies SI/HI/AH/VH. She has been adherent to her treatment regimen, although she says she will no longer take Haldol as it is for seizure & frontal headaches, and not for concentartion. She has not been able to contact any social supports outside the hospital.   -Schizophrenia             -  Continue haldol '5mg'$  BID             - Continue trileptal '300mg'$  BID -Insomnia             - Continue trazodone '1mg'$  qhs -EPS             - Continue cogentin '1mg'$  qhs  Physician Treatment Plan for Secondary Diagnosis: Principal Problem:   Schizophrenia, paranoid type ([place])   Medication Management: Evaluate patient's response, side effects, and tolerance of medication regimen.  Therapeutic Interventions: 1 to 1 sessions, Unit Group sessions and Medication  administration.  Evaluation of Outcomes: Adequate for Discharge   RN Treatment Plan for Primary Diagnosis: Schizophrenia  Long Term Goal(s): Knowledge of disease and therapeutic regimen to maintain health will improve  Short Term Goals: Ability to demonstrate self-control, Ability to participate in decision making will improve and Ability to verbalize feelings will improve  Medication Management: RN will administer medications as ordered by provider, will assess and evaluate patient's response and provide education to patient for prescribed medication. RN will report any adverse and/or side effects to prescribing provider.  Therapeutic Interventions: 1 on 1 counseling sessions, Psychoeducation, Medication administration, Evaluate responses to treatment, Monitor vital signs and CBGs as ordered, Perform/monitor CIWA, COWS, AIMS and Fall Risk screenings as ordered, Perform wound care treatments as ordered.  Evaluation of Outcomes: Adequate for Discharge   LCSW Treatment Plan for Primary Diagnosis: Schizophrenia  Long Term Goal(s): Safe transition to appropriate next level of care at discharge, Engage patient in therapeutic group addressing interpersonal concerns.  Short Term Goals: Engage patient in aftercare planning with referrals and resources, Increase social support and Increase skills for wellness and recovery  Therapeutic Interventions: Assess for all discharge needs, 1 to 1 time with Social worker, Explore available resources and support systems, Assess for adequacy in community support network, Educate family and significant other(s) on suicide prevention, Complete Psychosocial Assessment, Interpersonal group therapy.  Evaluation of Outcomes: Met   Progress in Treatment: Attending groups: No. Pt is isolating in room.  Participating in groups: No. Taking medication as prescribed: Yes. Toleration medication: Yes. Family/Significant other contact made: SPE completed with pt;  collateral information would be helpful but pt does not have any collateral contact information, nor are there any contacts in his chart.  Patient understands diagnosis: No. lacking insight; psychotic  Discussing patient identified problems/goals with staff: Yes. Medical problems stabilized or resolved: Yes. Denies suicidal/homicidal ideation: Yes. Issues/concerns per patient self-inventory: No. Other: n/a   New problem(s) identified: Yes, Describe:  poor historian due to psychosis--no collateral contacts. difficult to obtain accurate information.  New Short Term/Long Term Goal(s): medication management for elimination/reduction of psychotic symptoms; development of comprehensive mental wellness plan.   Discharge Plan or Barriers: Return home, follow up [facility]  Reason for Continuation of Hospitalization:  Delusions  Medication stabilization   Estimated Length of Stay: [facility] d/c tomorrow, Wed at the latest  Attendees: Patient: 06/15/2017 11:11 AM  Physician: Dr. Nancy Fetter MD 06/15/2017 11:11 AM  Nursing: Roosvelt Maser RN 06/15/2017 11:11 AM  RN Care Manager: Lars Pinks CM 06/15/2017 11:11 AM  Social Worker: ; Roque Lias LCSW 06/15/2017 11:11 AM  Recreational Therapist:  06/15/2017 11:11 AM  Other: Lindell Spar NP; Darnelle Maffucci Money NP 06/15/2017 11:11 AM  Other:  06/15/2017 11:11 AM  Other: 06/15/2017 11:11 AM    Scribe for Treatment Team: Trish Mage, LCSW 06/15/2017 11:11 AM

## 2017-06-15 NOTE — Progress Notes (Signed)
Nursing Note 06/15/2017 [id number]  Data Reports sleeping good without PRN sleep med.  Rates depression 0/10, hopelessness 0/10, and anxiety 0/10. Affect blunted.  Denies HI, SI, AVH.  Isolative, minimal, went outside for rec time, spent some time in dayroom in afternoon- withdrawn in milieu.  Action Spoke with patient 1:1, nurse offered support to patient throughout shift.  Continues to be monitored on 15 minute checks for safety.  Response Remains safe but withdrawn/isolative on unit.

## 2017-06-15 NOTE — Progress Notes (Signed)
[facility] MD Progress Note  06/15/2017 3:23 PM Collin White  MRN:  [id number]  Subjective: [name] [facility]), "I'm doing fine. I had an action dream last night. I was fighting this military group with my team & we won".  Objective- Collin White is a 21 y/o transgender male with preferred name of "Collin White" who was admitted with worsening symptoms of psychosis. Today patient reports that she is not going to take Haldol any more because it is for frontal headaches & seizure. She had been a lot of time in her room and does not socialize much. But, today, she she is visible on the unit, spending time in the dayroom with other patients. She attended the morning group sessions. She remains somewhat circumstantial and provides vague responses to questions. Pt shared earlier during the week with the attending psychiatrist that she was staying between multiple locations prior to coming to the hospital. She endorses ongoing grandiose delusions that she owns multiple houses but she is unable to provide further details about them other than some descriptions of the buildings. Pt is unable to provide any information about her family or other social supports. She denies SI/HI/AH/VH today. She has been tolerating current treatment regimen without difficulty. Pt reports, sleep is good and appetite is good overall. Discussed with patient about continuing the current treatment plan with plan to possibly discharge to shelter early next week and pt verbalized good understanding and was in agreement.  Principal Problem: Schizophrenia, paranoid type (HCC)  Diagnosis:   Patient Active Problem List   Diagnosis Date Noted  . Schizophrenia, paranoid type (HCC) [F20.0] 06/08/2017  . 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 Paranoid schizophrenia (HCC) [F20.0] 09/29/2015  . 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 History of posttraumatic  stress disorder (PTSD) [Z86.59] 09/26/2015   Total Time spent with patient: 15 minutes  Past Psychiatric History: See H&P  Past Medical History:  Past Medical History:  Diagnosis Date  . Psychoses (HCC)   . Schizophrenia (HCC)   . Seizures (HCC)     Past Surgical History:  Procedure Laterality Date  . abd surgery s/p traumatic event    . facial reconstructive surgery    . NO PAST SURGERIES  patient stated he had facial reconstruction surgery as a child   . 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 Hypertension Other   . Mental illness Neg Hx    Family Psychiatric  History: See H&P  Social History:  History  Alcohol Use No     History  Drug Use No    Social History   Social History  . Marital status: Single    Spouse name: N/A  . Number of children: N/A  . Years of education: N/A   Social History Main Topics  . Smoking status: Former Smoker    Types: Cigarettes  . Smokeless tobacco: Never Used  . Alcohol use No  . Drug use: No  . Sexual activity: Yes    Birth control/ protection: Condom   Other Topics Concern  . None   Social History Narrative   ** Merged History Encounter **       ** Merged History Encounter **       Additional Social History:   Sleep: Good  Appetite:  Good  Current Medications: Current Facility-Administered Medications  Medication Dose Route Frequency Provider  Last Rate Last Dose  . acetaminophen (TYLENOL) tablet 650 mg  650 mg Oral Q6H PRN Charm Rings, NP   650 mg at 06/12/17 0855  . alum & mag hydroxide-simeth (MAALOX/MYLANTA) 200-200-20 MG/5ML suspension 30 mL  30 mL Oral Q4H PRN Charm Rings, NP      . benztropine (COGENTIN) tablet 1 mg  1 mg Oral QHS Charm Rings, NP   1 mg at 06/14/17 2137  . haloperidol (HALDOL) tablet 5 mg  5 mg Oral BID Armandina Stammer I, NP   5 mg at 06/15/17 4098  . magnesium hydroxide (MILK OF MAGNESIA) suspension 30 mL  30 mL Oral Daily  PRN Charm Rings, NP      . Oxcarbazepine (TRILEPTAL) tablet 300 mg  300 mg Oral BID Charm Rings, NP   300 mg at 06/15/17 1191  . traZODone (DESYREL) tablet 50 mg  50 mg Oral QHS Charm Rings, NP   50 mg at 06/14/17 2137   Lab Results: No results found for this or any previous visit (from the past 48 hour(s)).  Blood Alcohol level:  Lab Results  Component Value Date   ETH <10 06/06/2017   ETH <5 01/24/2016   Metabolic Disorder Labs: Lab Results  Component Value Date   HGBA1C 5.2 06/10/2017   MPG 103 06/10/2017   MPG 114 09/27/2015   Lab Results  Component Value Date   PROLACTIN 32.0 (H) 06/10/2017   PROLACTIN 32.6 (H) 09/27/2015   Lab Results  Component Value Date   CHOL 158 06/10/2017   TRIG 252 (H) 06/10/2017   HDL 47 06/10/2017   CHOLHDL 3.4 06/10/2017   VLDL 50 (H) 06/10/2017   LDLCALC 61 06/10/2017   LDLCALC 76 09/27/2015   Physical Findings: AIMS: Facial and Oral Movements Muscles of Facial Expression: None, normal Lips and Perioral Area: None, normal Jaw: None, normal Tongue: None, normal,Extremity Movements Upper (arms, wrists, hands, fingers): None, normal Lower (legs, knees, ankles, toes): None, normal, Trunk Movements Neck, shoulders, hips: None, normal, Overall Severity Severity of abnormal movements (highest score from questions above): None, normal Incapacitation due to abnormal movements: None, normal Patient's awareness of abnormal movements (rate only patient's report): No Awareness, Dental Status Current problems with teeth and/or dentures?: No Does patient usually wear dentures?: No  CIWA:    COWS:     Musculoskeletal: Strength & Muscle Tone: within normal limits Gait & Station: normal Patient leans: N/A  Psychiatric Specialty Exam: Physical Exam  Nursing note and vitals reviewed.   Review of Systems  Constitutional: Negative for chills and fever.  Respiratory: Negative for cough and shortness of breath.   Cardiovascular:  Negative for chest pain.  Skin: Negative for rash.  Psychiatric/Behavioral: Negative for depression, hallucinations and suicidal ideas.    Blood pressure 119/65, pulse 79, temperature 97.6 F (36.4 C), temperature source Oral, resp. rate 16, height 6' (1.829 m), weight 61.7 kg (136 lb).Body mass index is 18.44 kg/m.  General Appearance: Disheveled  Eye Contact:  Fair  Speech:  Slow  Volume:  Decreased  Mood:  Anxious  Affect:  Flat  Thought Process:  Disorganized and Descriptions of Associations: Circumstantial  Orientation:  Full (Time, Place, and Person)  Thought Content:  Ideas of Reference:   Delusions  Suicidal Thoughts:  Denies any thoughts, plans or intent  Homicidal Thoughts:  Denies  Memory:  Immediate;   Fair Recent;   Fair Remote;   Fair  Judgement:  Fair  Insight:  Lacking  Psychomotor Activity:  Normal  Concentration:  Concentration: Good  Recall:  Fair  Fund of Knowledge:  Fair  Language:  Fair  Akathisia:  No  Handed:    AIMS (if indicated):     Assets:  Resilience Social Support  ADL's:  Intact  Cognition:  WNL  Sleep:  Number of Hours: 6.5   Treatment Plan Summary: Daily contact with patient to assess and evaluate symptoms and progress in treatment and Medication management. Pt continues to be delusional and grandiose, but she denies SI/HI/AH/VH. She has been adherent to her treatment regimen, although she says she will no longer take Haldol as it is for seizure & frontal headaches, and not for concentartion. She has not been able to contact any social supports outside the hospital.   Will continue today 06/15/17 plan as below except where it is noted.  -Schizophrenia  - Continue haldol 5mg  BID  - Continue trileptal 300mg  BID -Insomnia  - Continue trazodone 1mg  qhs -EPS  - Continue cogentin 1mg  qhs -Encourage participation in groups and the therapeutic milieu - Increase collateral informatino - Discharge planning will be ongoing  Sanjuana KavaNwoko, Agnes I,  NP, PMHNP, FNP-BC. 06/15/2017, 3:23 PMPatient ID: [name], male   DOB: 07/10/1994, 21 y.o.   MRN: [id number]

## 2017-06-16 NOTE — Progress Notes (Signed)
[facility] MD Progress Note  06/16/2017 2:42 PM Collin White  MRN:  [id number] Subjective:    Collin White is a 21 y/o transgender male with preferred name of "Collin White" and with history of schizophrenia who presented with worsening symptoms of psychosis. Pt was restarted on haldol and has shown improvement in thought organization since initial presentation. Pt reports that she is doing well today overall. She is sleeping "OK" and her mood is "good." Pt denies SI/HI/AH/VH. She continues to be somewhat disorganized and tangential in responses to questions about discharge planning; for example, pt was asked what her plan for discharge was and she replied, "I think I'm going to go to the mall and get me an XBOX one and an XBOX PS2 and another one, and I want to get all the games." Pt was asked where she plans to stay, and she stated in one of her houses, but she was unable to give the address. Discussed with patient that we could assist her in getting to a shelter, but she did not feel that was necessary. Discussed with patient that her symptoms have been stable and so we were anticipating her upcoming discharge, and pt stated that she felt she would be ready tomorrow. She had no further questions, comments, or concerns.  Principal Problem: Schizophrenia, paranoid type (HCC) Diagnosis:   Patient Active Problem List   Diagnosis Date Noted  . Schizophrenia, paranoid type (HCC) [F20.0] 06/08/2017  . 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 Paranoid schizophrenia (HCC) [F20.0] 09/29/2015  . 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 History of posttraumatic stress disorder (PTSD) [Z86.59] 09/26/2015   Total Time spent with patient: 30 minutes  Past Psychiatric History: see H&P  Past Medical History:  Past Medical History:  Diagnosis Date  . Psychoses (HCC)   . Schizophrenia (HCC)   . Seizures (HCC)     Past Surgical  History:  Procedure Laterality Date  . abd surgery s/p traumatic event    . facial reconstructive surgery    . NO PAST SURGERIES  patient stated he had facial reconstruction surgery as a child   . 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 Hypertension Other   . Mental illness Neg Hx    Family Psychiatric  History: see H&P Social History:  History  Alcohol Use No     History  Drug Use No    Social History   Social History  . Marital status: Single    Spouse name: N/A  . Number of children: N/A  . Years of education: N/A   Social History Main Topics  . Smoking status: Former Smoker    Types: Cigarettes  . Smokeless tobacco: Never Used  . Alcohol use No  . Drug use: No  . Sexual activity: Yes    Birth control/ protection: Condom   Other Topics Concern  . None   Social History Narrative   ** Merged History Encounter **       ** Merged History Encounter **       Additional Social History:                         Sleep: Good  Appetite:  Good  Current Medications: Current Facility-Administered Medications  Medication Dose Route Frequency Provider Last Rate Last Dose  . acetaminophen (TYLENOL) tablet  650 mg  650 mg Oral Q6H PRN Charm Rings, NP   650 mg at 06/12/17 0855  . alum & mag hydroxide-simeth (MAALOX/MYLANTA) 200-200-20 MG/5ML suspension 30 mL  30 mL Oral Q4H PRN Charm Rings, NP      . benztropine (COGENTIN) tablet 1 mg  1 mg Oral QHS Charm Rings, NP   1 mg at 06/14/17 2137  . haloperidol (HALDOL) tablet 5 mg  5 mg Oral BID Armandina Stammer I, NP   5 mg at 06/16/17 0842  . magnesium hydroxide (MILK OF MAGNESIA) suspension 30 mL  30 mL Oral Daily PRN Charm Rings, NP      . Oxcarbazepine (TRILEPTAL) tablet 300 mg  300 mg Oral BID Charm Rings, NP   300 mg at 06/16/17 0843  . traZODone (DESYREL) tablet 50 mg  50 mg Oral QHS Charm Rings, NP   50 mg at 06/14/17 2137    Lab  Results: No results found for this or any previous visit (from the past 48 hour(s)).  Blood Alcohol level:  Lab Results  Component Value Date   ETH <10 06/06/2017   ETH <5 01/24/2016    Metabolic Disorder Labs: Lab Results  Component Value Date   HGBA1C 5.2 06/10/2017   MPG 103 06/10/2017   MPG 114 09/27/2015   Lab Results  Component Value Date   PROLACTIN 32.0 (H) 06/10/2017   PROLACTIN 32.6 (H) 09/27/2015   Lab Results  Component Value Date   CHOL 158 06/10/2017   TRIG 252 (H) 06/10/2017   HDL 47 06/10/2017   CHOLHDL 3.4 06/10/2017   VLDL 50 (H) 06/10/2017   LDLCALC 61 06/10/2017   LDLCALC 76 09/27/2015    Physical Findings: AIMS: Facial and Oral Movements Muscles of Facial Expression: None, normal Lips and Perioral Area: None, normal Jaw: None, normal Tongue: None, normal,Extremity Movements Upper (arms, wrists, hands, fingers): None, normal Lower (legs, knees, ankles, toes): None, normal, Trunk Movements Neck, shoulders, hips: None, normal, Overall Severity Severity of abnormal movements (highest score from questions above): None, normal Incapacitation due to abnormal movements: None, normal Patient's awareness of abnormal movements (rate only patient's report): No Awareness, Dental Status Current problems with teeth and/or dentures?: No Does patient usually wear dentures?: No  CIWA:    COWS:     Musculoskeletal: Strength & Muscle Tone: within normal limits Gait & Station: normal Patient leans: N/A  Psychiatric Specialty Exam: Physical Exam  Nursing note and vitals reviewed.   Review of Systems  Constitutional: Negative for chills and fever.  Respiratory: Negative for cough and shortness of breath.   Cardiovascular: Negative for chest pain.  Gastrointestinal: Negative for abdominal pain, heartburn, nausea and vomiting.  Psychiatric/Behavioral: Negative for depression, hallucinations and suicidal ideas. The patient is not nervous/anxious.     Blood  pressure 108/84, pulse 75, temperature 98.2 F (36.8 C), temperature source Oral, resp. rate 16, height 6' (1.829 m), weight 61.7 kg (136 lb).Body mass index is 18.44 kg/m.  General Appearance: Disheveled  Eye Contact:  Fair  Speech:  Normal Rate  Volume:  Decreased  Mood:  Anxious  Affect:  Appropriate, Congruent and Flat  Thought Process:  Disorganized and Descriptions of Associations: Tangential  Orientation:  Full (Time, Place, and Person)  Thought Content:  Delusions  Suicidal Thoughts:  No  Homicidal Thoughts:  No  Memory:  Immediate;   Good Recent;   Good Remote;   Good  Judgement:  Impaired  Insight:  Lacking  Psychomotor Activity:  Normal  Concentration:  Concentration: Good  Recall:  Fair  Fund of Knowledge:  Fair  Language:  Fair  Akathisia:  No  Handed:    AIMS (if indicated):     Assets:  Communication Skills Desire for Improvement Resilience  ADL's:  Intact  Cognition:  WNL  Sleep:  Number of Hours: 6.5     Treatment Plan Summary: Daily contact with patient to assess and evaluate symptoms and progress in treatment and Medication management. Pt shows improvement of psychotic symptoms after being restarted on antipsychotic medications. She is tolerating current medication regimen without difficulty. She has some ongoing grandiose delusions and thought disorganization today. She requests for discharge tomorrow.  -Continue inpatient hospitalization -Schizophrenia  -Continue haldol 5mg  BID  - Continue trileptal 300mg  BID -Insomnia  -Continue trazodone 50mg  qhs prn insomnia  -EPS  -Continue cogentin 1mg  qhs - Encourage participation in groups and the therapeutic milieu -Discharge planning will be ongoing   Micheal Likens, MD 06/16/2017, 2:42 PM

## 2017-06-16 NOTE — [facility] Group Notes (Signed)
LCSW Group Therapy Note   06/16/2017 1:15pm   Type of Therapy and Topic:  Group Therapy:  Positive Affirmations   Participation Level:  Did Not Attend  Description of Group: This group addressed positive affirmation toward self and others. Patients went around the room and identified two positive things about themselves and two positive things about a peer in the room. Patients reflected on how it felt to share something positive with others, to identify positive things about themselves, and to hear positive things from others. Patients were encouraged to have a daily reflection of positive characteristics or circumstances.  Therapeutic Goals 1. Patient will verbalize two of their positive qualities 2. Patient will demonstrate empathy for others by stating two positive qualities about a peer in the group 3. Patient will verbalize their feelings when voicing positive self affirmations and when voicing positive affirmations of others 4. Patients will discuss the potential positive impact on their wellness/recovery of focusing on positive traits of self and others. Summary of Patient Progress:    Therapeutic Modalities Cognitive Behavioral Therapy Motivational Interviewing  Collin RogueRodney B [name], [place] 06/16/2017 4:29 PM

## 2017-06-16 NOTE — Progress Notes (Addendum)
Nursing Note 06/16/2017 [id number]  Data Reports sleeping good without PRN sleep med.  Rates depression 0/10, hopelessness 0/10, and anxiety 0/10. Affect bright during most interactions.  Denies HI, SI, AVH.  Took all medicines this AM without question, pleasant, forwards little with RN.  Remains in room most of day except for meals.     Action Spoke with patient 1:1, nurse offered support to patient throughout shift.  Continues to be monitored on 15 minute checks for safety.  Response Remains safe on unit, though isolative.  Addendum Refused PM medicine stating "I don't take haldol" and refused to take trileptal as well.

## 2017-06-16 NOTE — Progress Notes (Signed)
Pt in dayroom and attends group.  Pt speaks during group but speech is incoherent, rambling and word salad.  Pt returns to room after snack and is in bed.  With some verbal encouragement, pt is able to respond to medication administration requests and politely refuses all medications without explanation.  Pt denies pain or discomfort. Pt offered encouragement but falls asleep while being spoken to. Pt continuously observed every 15 min for safety.

## 2017-06-16 NOTE — Progress Notes (Signed)
Recreation Therapy Notes  Date: 06/16/17 Time: 1000 Location: 500 Hall Dayroom  Group Topic: Self-Esteem  Goal Area(s) Addresses:  Patient will successfully identify positive attributes about themselves.  Patient will successfully identify benefit of improved self-esteem.   Intervention: Colored pencils, facial masks  Activity: How I See Me.  Patients were given a blank outline of a face.  Patients were to fill in the face with pictures or words that describe how they see themselves.  Education:  Self-Esteem, Building control surveyorDischarge Planning.   Education Outcome: Acknowledges education/In group clarification offered/Needs additional education  Clinical Observations/Feedback: Pt did not attend group.   Caroll RancherMarjette Zita Ozimek, LRT/CTRS         Caroll RancherLindsay, Harriet Bollen A 06/16/2017 11:27 AM

## 2017-06-16 NOTE — Progress Notes (Signed)
Did not attend group 

## 2017-06-17 ENCOUNTER — Encounter (HOSPITAL_COMMUNITY): Payer: Self-pay | Admitting: *Deleted

## 2017-06-17 ENCOUNTER — Emergency Department (HOSPITAL_COMMUNITY): Payer: Self-pay

## 2017-06-17 ENCOUNTER — Emergency Department (HOSPITAL_COMMUNITY)
Admission: EM | Admit: 2017-06-17 | Discharge: 2017-06-18 | Disposition: A | Discharge status: Home / Self Care | Payer: Self-pay | Attending: Emergency Medicine | Admitting: Emergency Medicine

## 2017-06-17 DIAGNOSIS — M25522 Pain in left elbow: Secondary | ICD-10-CM | POA: Insufficient documentation

## 2017-06-17 DIAGNOSIS — Z87891 Personal history of nicotine dependence: Secondary | ICD-10-CM | POA: Insufficient documentation

## 2017-06-17 DIAGNOSIS — R0789 Other chest pain: Secondary | ICD-10-CM | POA: Insufficient documentation

## 2017-06-17 MED ORDER — BENZTROPINE MESYLATE 1 MG PO TABS: 1.0000 mg | ORAL_TABLET | Freq: Every day | ORAL | 0 refills | Status: DC

## 2017-06-17 MED ORDER — ARIPIPRAZOLE 10 MG PO TABS: 10.0000 mg | ORAL_TABLET | Freq: Every day | ORAL | 0 refills | Status: DC

## 2017-06-17 MED ORDER — ARIPIPRAZOLE 10 MG PO TABS
10.0000 mg | ORAL_TABLET | Freq: Every day | ORAL | Status: DC
  Administered 2017-06-17: 10 mg via ORAL
  Filled 2017-06-17: qty 7
  Filled 2017-06-17 (×3): qty 1

## 2017-06-17 MED ORDER — OXCARBAZEPINE 300 MG PO TABS: 300.0000 mg | ORAL_TABLET | Freq: Two times a day (BID) | ORAL | 0 refills | Status: DC

## 2017-06-17 MED ORDER — TRAZODONE HCL 50 MG PO TABS: 50.0000 mg | ORAL_TABLET | Freq: Every day | ORAL | 0 refills | Status: DC

## 2017-06-17 NOTE — Progress Notes (Signed)
DAR Note  Pt remained in room all evening. Pt denied any anxiety, depression, pain, HI, or SI only by saying "NO" when asked about each. Pt may be interacting to some internal voices. Pt made minimal eye contact. Pt refused to participate during assessment. Pt does not look to be in any acute distress. Pt was med compliant. Support, encouragement, and safe environment provided.  15-minute safety checks continue. Pt did not attend wrap up group.

## 2017-06-17 NOTE — ED Notes (Signed)
Patient transported to X-ray 
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
## 2017-06-17 NOTE — ED Notes (Signed)
ED Provider at bedside. 
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
## 2017-06-17 NOTE — ED Triage Notes (Signed)
The pt has multiple complaints  He has pain in his throat lt sided body cramps his rt elbow  And his rt arms and other areas for one week.  He has a sling on his rt arm that he is not wearing  He has pain in his rt hand

## 2017-06-17 NOTE — ED Notes (Signed)
Pt given food (hot chocolate and graham crackers with peanut butter) at 22:56.

## 2017-06-17 NOTE — Plan of Care (Addendum)
Problem: Education: Goal: Utilization of techniques to improve thought processes will improve Outcome: Progressing Patient with blunted affect, denies SI/HI/AVH, took all of his scheduled meds, reports that he ate all of his breakfast, and is currently at lunch.  Patient verbalizes readiness for discharge.  Q15 minute checks in place, pt denies having any current concerns, will continue to monitor.  Addendum: Patient educated on his after visit summary, and verbalizes understanding.

## 2017-06-17 NOTE — Progress Notes (Signed)
  [facility] :  Will you be returning to the same living situation after discharge:  Yes,  home, which is undefined At discharge, do you have transportation home?: Yes,  bus pass Do you have the ability to pay for your medications: Yes,  mental health  Release of information consent forms completed and in the chart;  Patient's signature needed at discharge.  Patient to Follow up at: Follow-up Information    [name] Follow up on 06/19/2017.   Specialty:  Behavioral Health Why:  Hospital follow-up on Friday 11/2 at 8:00AM. If you have them, please bring: photo ID and social security card to this appt. Thank you.  Contact information: [address] [place] [address] [phone]           Next level of care provider [facility] Link:no  Safety Planning and Suicide Prevention discussed: Yes,  yes  Have you used any form of tobacco in the last 30 days? (Cigarettes, Smokeless Tobacco, Cigars, and/or Pipes): No  Has patient been referred to the Quitline?: N/A patient is not a smoker  Patient has been referred for addiction treatment: N/A  Ida RogueRodney B Emillia Weatherly, LCSW 06/17/2017, 10:08 AM

## 2017-06-18 ENCOUNTER — Emergency Department (HOSPITAL_COMMUNITY)
Admission: EM | Admit: 2017-06-18 | Discharge: 2017-06-19 | Disposition: A | Discharge status: Home / Self Care | Payer: Self-pay | Attending: Emergency Medicine | Admitting: Emergency Medicine

## 2017-06-18 ENCOUNTER — Emergency Department (HOSPITAL_COMMUNITY): Payer: Self-pay

## 2017-06-18 ENCOUNTER — Encounter (HOSPITAL_COMMUNITY): Payer: Self-pay

## 2017-06-18 DIAGNOSIS — Z87891 Personal history of nicotine dependence: Secondary | ICD-10-CM | POA: Insufficient documentation

## 2017-06-18 DIAGNOSIS — Z79899 Other long term (current) drug therapy: Secondary | ICD-10-CM | POA: Insufficient documentation

## 2017-06-18 DIAGNOSIS — M62838 Other muscle spasm: Secondary | ICD-10-CM | POA: Insufficient documentation

## 2017-06-18 DIAGNOSIS — F22 Delusional disorders: Secondary | ICD-10-CM | POA: Insufficient documentation

## 2017-06-18 DIAGNOSIS — M542 Cervicalgia: Secondary | ICD-10-CM | POA: Insufficient documentation

## 2017-06-18 LAB — ETHANOL

## 2017-06-18 LAB — CBC WITH DIFFERENTIAL/PLATELET
BASOS ABS: 0 10*3/uL (ref 0.0–0.1)
Basophils Relative: 0 %
EOS PCT: 0 %
Eosinophils Absolute: 0 10*3/uL (ref 0.0–0.7)
HCT: 41.4 % (ref 39.0–52.0)
HEMOGLOBIN: 14.1 g/dL (ref 13.0–17.0)
LYMPHS PCT: 22 %
Lymphs Abs: 1.8 10*3/uL (ref 0.7–4.0)
MCH: 28.6 pg (ref 26.0–34.0)
MCHC: 34.1 g/dL (ref 30.0–36.0)
MCV: 84 fL (ref 78.0–100.0)
Monocytes Absolute: 0.5 10*3/uL (ref 0.1–1.0)
Monocytes Relative: 6 %
NEUTROS ABS: 5.8 10*3/uL (ref 1.7–7.7)
NEUTROS PCT: 72 %
PLATELETS: 198 10*3/uL (ref 150–400)
RBC: 4.93 MIL/uL (ref 4.22–5.81)
RDW: 14.4 % (ref 11.5–15.5)
WBC: 8.1 10*3/uL (ref 4.0–10.5)

## 2017-06-18 LAB — COMPREHENSIVE METABOLIC PANEL
ALBUMIN: 4.6 g/dL (ref 3.5–5.0)
ALK PHOS: 89 U/L (ref 38–126)
ALT: 20 U/L (ref 17–63)
AST: 24 U/L (ref 15–41)
Anion gap: 9 (ref 5–15)
BILIRUBIN TOTAL: 0.7 mg/dL (ref 0.3–1.2)
BUN: 15 mg/dL (ref 6–20)
CALCIUM: 9.8 mg/dL (ref 8.9–10.3)
CO2: 25 mmol/L (ref 22–32)
CREATININE: 0.87 mg/dL (ref 0.61–1.24)
Chloride: 105 mmol/L (ref 101–111)
GFR calc Af Amer: >60 mL/min (ref >60)
GLUCOSE: 120 mg/dL — AB (ref 65–99)
Potassium: 3.5 mmol/L (ref 3.5–5.1)
Sodium: 139 mmol/L (ref 135–145)
TOTAL PROTEIN: 8 g/dL (ref 6.5–8.1)

## 2017-06-18 LAB — URINALYSIS, ROUTINE W REFLEX MICROSCOPIC
Bilirubin Urine: NEGATIVE
GLUCOSE, UA: NEGATIVE mg/dL
Hgb urine dipstick: NEGATIVE
Ketones, ur: NEGATIVE mg/dL
LEUKOCYTES UA: NEGATIVE
Nitrite: NEGATIVE
PROTEIN: NEGATIVE mg/dL
SPECIFIC GRAVITY, URINE: 1.029 (ref 1.005–1.030)
pH: 7 (ref 5.0–8.0)

## 2017-06-18 LAB — RAPID URINE DRUG SCREEN, HOSP PERFORMED
Amphetamines: NOT DETECTED
BARBITURATES: NOT DETECTED
BENZODIAZEPINES: NOT DETECTED
Cocaine: NOT DETECTED
Opiates: NOT DETECTED
Tetrahydrocannabinol: NOT DETECTED

## 2017-06-18 MED ORDER — NAPROXEN 500 MG PO TABS: 500.0000 mg | ORAL_TABLET | Freq: Two times a day (BID) | ORAL | 0 refills | Status: DC

## 2017-06-18 MED ORDER — METHOCARBAMOL 500 MG PO TABS: 500.0000 mg | ORAL_TABLET | Freq: Every evening | ORAL | 0 refills | Status: DC | PRN

## 2017-06-18 NOTE — Discharge Instructions (Signed)
As discussed the medication prescribed cannot be taken if driving or operating machinery. Take only as needed at bedtime to help with muscle spasm. Apply heat to your neck. Take naproxen twice a day with food.  Follow up with the wellness center with a primary care provider.

## 2017-06-18 NOTE — ED Triage Notes (Signed)
[facility] Pt also endorses left rib pain. Pt

## 2017-06-18 NOTE — ED Provider Notes (Signed)
[facility] Provider Note   CSN: [id number] Arrival date & time: 06/18/17  1607     History   Chief Complaint No chief complaint on file.   HPI Collin White is a 21 y.o. male presenting with neck pain that has been chronic since February but worse today. He reports that his neck feels broken and has come out of its hinge and he came in to be evaluated because the pain is worse today. He also reports that his left side hurts pointing to his left ribs. He denies any new injury or trauma. No abdominal pain, nausea, vomiting, flank pain. Denies cough, fever, chills.  HPI  Past Medical History:  Diagnosis Date  . Psychoses (HCC)   . Schizophrenia (HCC)   . Seizures [facility](HCC)     Patient Active Problem List   Diagnosis Date Noted  . Schizophrenia, paranoid type (HCC) 06/08/2017  . 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 Psychoses (HCC)   . Paranoid schizophrenia (HCC) 09/29/2015  . Cannabis use disorder, moderate, in early remission (HCC) 09/26/2015  . History of posttraumatic stress disorder (PTSD) 09/26/2015    Past Surgical History:  Procedure Laterality Date  . abd surgery s/p traumatic event    . facial reconstructive surgery    . NO PAST SURGERIES  patient stated he had facial reconstruction surgery as a child   . tumor removed from head          Home Medications    Prior to Admission medications   Medication Sig Start Date End Date Taking? Authorizing Provider  ARIPiprazole (ABILIFY) 10 MG tablet Take 1 tablet (10 mg total) by mouth daily. For mood control 06/17/17   Armandina StammerNwoko, Agnes I, NP  benztropine (COGENTIN) 1 MG tablet Take 1 tablet (1 mg total) by mouth at bedtime. For prevention of drug induced tremors 06/17/17   Armandina StammerNwoko, Agnes I, NP  methocarbamol (ROBAXIN) 500 MG tablet Take 1 tablet (500 mg total) by mouth at bedtime as needed for muscle spasms. 06/18/17   Mathews RobinsonsMitchell, Ginette Bradway B, PA-C  naproxen (NAPROSYN) 500 MG  tablet Take 1 tablet (500 mg total) by mouth 2 (two) times daily with a meal. 06/18/17   Georgiana ShoreMitchell, Eathon Valade B, PA-C  Oxcarbazepine (TRILEPTAL) 300 MG tablet Take 1 tablet (300 mg total) by mouth 2 (two) times daily. For mood stabilization 06/17/17   Armandina StammerNwoko, Agnes I, NP  traZODone (DESYREL) 50 MG tablet Take 1 tablet (50 mg total) by mouth at bedtime. For sleep 06/17/17   Armandina StammerNwoko, Agnes I, NP    Family History Family History  Problem Relation Age of Onset  . Hypertension Other   . Mental illness Neg Hx     Social History Social History  Substance Use Topics  . Smoking status: Former Smoker    Types: Cigarettes  . Smokeless tobacco: Never Used  . Alcohol use No     Allergies   Patient has no known allergies.   Review of Systems Review of Systems  Constitutional: Negative for chills, diaphoresis and fever.  HENT: Negative for ear pain and sore throat.   Eyes: Negative for photophobia, pain, redness and visual disturbance.  Respiratory: Negative for cough, choking, chest tightness, shortness of breath, wheezing and stridor.        Left lower rib pain  Cardiovascular: Negative for chest pain and palpitations.  Gastrointestinal: Negative for abdominal distention, abdominal pain, blood in stool, diarrhea, nausea and vomiting.  Genitourinary: Negative for difficulty urinating, dysuria and hematuria.  Musculoskeletal: Positive for myalgias, neck pain and neck stiffness. Negative for arthralgias, back pain, gait problem and joint swelling.  Skin: Negative for color change, pallor, rash and wound.  Neurological: Negative for dizziness, seizures, syncope, facial asymmetry, speech difficulty, light-headedness, numbness and headaches.     Physical Exam Updated Vital Signs BP 121/87 (BP Location: Left Arm)   Pulse 80   Temp 98.1 F (36.7 C) (Oral)   Resp 14   SpO2 98%   Physical Exam  Constitutional: He appears well-developed and well-nourished. No distress.  Afebrile, nontoxic  appearing, disheveled and delusional. Patient is using the bottom of his jeans as shoes/socks to walk on. His underwear showing and are disposable hospital undergarments.  HENT:  Head: Normocephalic and atraumatic.  Eyes: Pupils are equal, round, and reactive to light. Conjunctivae and EOM are normal.  Neck: Normal range of motion. Neck supple. No tracheal deviation present.  Cardiovascular: Normal rate and regular rhythm.   No murmur heard. Pulmonary/Chest: Effort normal and breath sounds normal. No stridor. No respiratory distress.  Abdominal: Soft. There is no tenderness.  Musculoskeletal: Normal range of motion. He exhibits tenderness. He exhibits no edema or deformity.  Tenderness to palpation along the trapezius muscle especially on the left. Full range of motion of the neck. No midline tenderness palpation of the spine.  Lymphadenopathy:    He has no cervical adenopathy.  Neurological: He is alert. No sensory deficit. He exhibits normal muscle tone.  Skin: Skin is warm and dry. He is not diaphoretic.  Psychiatric:  Patient was having delusions that he has to mother as they gave birth to him and talking about his for father's. He is describing his neck as falling off of a hinge and reporting that it is broken.  Nursing note and vitals reviewed.    ED Treatments / Results  Labs (all labs ordered are listed, but only abnormal results are displayed) Labs Reviewed  COMPREHENSIVE METABOLIC PANEL  ETHANOL  RAPID URINE DRUG SCREEN, HOSP PERFORMED  CBC WITH DIFFERENTIAL/PLATELET  URINALYSIS, ROUTINE W REFLEX MICROSCOPIC    EKG  EKG Interpretation None       Radiology Dg Elbow Complete Left  Result Date: 06/18/2017 CLINICAL DATA:  21 year old male with injury to the left elbow. EXAM: LEFT ELBOW - COMPLETE 3+ VIEW COMPARISON:  None. FINDINGS: There is no evidence of fracture, dislocation, or joint effusion. There is no evidence of arthropathy or other focal bone abnormality.  Soft tissues are unremarkable. IMPRESSION: Negative. Electronically Signed   By: Elgie Collard M.D.   On: 06/18/2017 00:13    Procedures Procedures (including critical care time)  Medications Ordered in ED Medications - No data to display   Initial Impression / Assessment and Plan / ED Course  I have reviewed the triage vital signs and the nursing notes.  Pertinent labs & imaging results that were available during my care of the patient were reviewed by me and considered in my medical decision making (see chart for details).     Patient presenting desheveled without shoes. Patient has been to the Emergency department for various complaints 30 time over the past 6 months and does not have a care plan in place.  Spoke to case management who has seen patient. He was describing his two mothers who gave birth to him and delusions during her encounter.  Will medically clear and consult TTS for inpatient placement and medication management.  Case management also concerned that he will not have enough support and unlikely  to follow up outpatient.  He doesn't appear to be compliant with medications.  Patient's labs and imaging unremarkable.  Awaiting TTS consult.  TTS recommending overnight observation and reevaluation in the morning.  Patient care transferred at end of shift to Roxy Horseman, PA-C pending psych re-eval in the am. Home meds reordered. Final Clinical Impressions(s) / ED Diagnoses   Final diagnoses:  Neck pain  Muscle spasm  Delusions (HCC)    New Prescriptions New Prescriptions   METHOCARBAMOL (ROBAXIN) 500 MG TABLET    Take 1 tablet (500 mg total) by mouth at bedtime as needed for muscle spasms.   NAPROXEN (NAPROSYN) 500 MG TABLET    Take 1 tablet (500 mg total) by mouth 2 (two) times daily with a meal.     Georgiana Shore, PA-C 06/19/17 1610    Bethann Berkshire, MD 06/19/17 [id number]

## 2017-06-18 NOTE — Care Management (Signed)
ED CM consulted by Ronalee Belts RN on Pod C, patient noted to have had 30 ED visits in the past 6 months. Patient discharged from [facility] yesterday to follow up outpatient. CM met with patient in room to discuss transitional care follow up. Patient appeared disheveled with blunt affect, patient tells me " I have two mothers it took two women to bring me here, and some people need 5 fathers to get them here". He talks about "little men looking in my window, and I will need  the police to get in" Patient unable to tell me where he lives,  Patient denies SI/HI at this time. Patient thoughts are disorganized. Update EDP,  TTS consult ordered.

## 2017-06-18 NOTE — ED Notes (Signed)
TTS in progress 

## 2017-06-18 NOTE — ED Notes (Signed)
Patient ambulated to room from lobby without difficulty.

## 2017-06-18 NOTE — Discharge Instructions (Signed)
Your x-rays are negative for any fractures. And your chest pain is muscular and not dangerous. Take ibuprofen for pain and follow up with your doctor for recheck if symptoms continue.

## 2017-06-18 NOTE — ED Notes (Addendum)
Patient changed into purple scrubs and wanded by security. 

## 2017-06-18 NOTE — BH Assessment (Addendum)
Tele Assessment Note   Patient Name: Collin White MRN: [id number] Referring Physician: Mathews Robinsons, PA Location of Patient: [facility] Location of Provider: Behavioral Health TTS Department  Collin White is an 21 y.o. male who is a male-to-male transgender patient and wishes to go by the name "Collin White." Pt was inpatient at [name] from 06/09/17-06/17/17 and was discharged yesterday. Pt returned to the [facility] today making complaints of medical symptoms including neck pain and "seizure headaches." Pt explained that seizure headaches are headaches when his head hurts so bad that he has "snot coming out my nose that size of a baseball." Per pt record, yesterday pt was still having disorganized thoughts and delusional thinking. Pt sts he went to the ED on 06/09/17 due to "eating some pie and not being able to swallow because swallowing would rupture my trachea." Pt denied and still denies SI, HI, SHI and AVH. Pt has previously been diagnosed with Schizophrenia, paranoid type and PTSD stemming from childhood trauma. Pt  sts he lives in a house, his house, on [address]. Pt denies seeing any "little men looking in the window" as he reported in his CM meeting during his IP stay at [facility]. Pt denied AVH and did not appear to be responding to internal stimuli. Pt did make statements indicating delusional thinking including describing "his diamond store in [place]" and "his luxury car." Pt was able to answer most questions during the assessments but once did begin making delusional statements.   Pt sts he lives alone in a home on [address].although it it is reported earlier that he came into the ED from [employer]. Per pt record, he has no family help or support. Pt reportedly has been seen in the ED now 32 times in the last 6 months. Pt has had two psychiatric admission: 1 in [id number] and one from 10/23-10/31/18, both at [facility]. Pt's record and pt confirmed he is being seen at [facility] for medication management. Pt  sts he is not taking his medication as it is prescribed and has not been for quite a while. Pt denied using Cannibis "since about last year" but tested positive in the ED on 06/09/17. Pt reported sleeping well at night and eating regularly. Pt denied symptoms of depression and anxiety.   Pt was dressed in scrubs and sitting quietly on his hospital bed. Pt was alert, cooperative and polite. Pt kept good eye contact, spoke in a mostly clear tone and at a normal pace. At times, pt seemed to be mumbling but when asked to repeat, most often spoke in a coherent, logical response. Pt moved in a normal manner when moving. Pt's thought process was mostly coherent and relevant and judgement was impaired.  Some indication of delusional thinking present but no indication of response to internal stimuli. Pt's mood was stated as neither depressed or anxious and his blunted affect was incongruent.  Pt was oriented x 4, to person, place, time and situation.   Diagnosis: Schizophrenia, paranoid type; Borderline Intellectual Functioning by hx; PTSD by hx  Past Medical History:  Past Medical History:  Diagnosis Date  . Psychoses (HCC)   . Schizophrenia (HCC)   . Seizures (HCC)     Past Surgical History:  Procedure Laterality Date  . abd surgery s/p traumatic event    . facial reconstructive surgery    . NO PAST SURGERIES  patient stated he had facial reconstruction surgery as a child   . tumor removed from head  Family History:  Family History  Problem Relation Age of Onset  . Hypertension Other   . Mental illness Neg Hx     Social History:  reports that he has quit smoking. His smoking use included Cigarettes. He has never used smokeless tobacco. He reports that he does not drink alcohol or use drugs.  Additional Social History:  Alcohol / Drug Use Prescriptions: SEE MAR History of alcohol / drug use?: Yes Longest period of sobriety (when/how long): UNKNOWN Substance #1 Name of Substance 1:  CANNABIS PER HX 1 - Age of First Use: UNK 1 - Amount (size/oz): UNK 1 - Frequency: UNK 1 - Duration: UNK 1 - Last Use / Amount: TESTED POSITIVE 10/31 IN ED  CIWA: CIWA-Ar BP: 121/87 Pulse Rate: 80 COWS:    PATIENT STRENGTHS: (choose at least two) Communication skills Physical Health  Allergies: No Known Allergies  Home Medications:  (Not in a hospital admission)  OB/GYN Status:  No LMP for male patient.  General Assessment Data [facility] T[facility] Assessment: In system Is this a Tele or Face-to-Face Assessment?: Tele Assessment Is this an Initial Assessment or a Re-assessment for this encounter?: Initial Assessment Marital status: Single Is patient pregnant?: No Pregnancy Status: No Living Arrangements: Alone Can pt return to current living arrangement?: Yes Admission Status: Voluntary Is patient capable of signing voluntary admission?: Yes Referral Source: Self/Family/Friend Insurance type: SELF PAY     Crisis Care Plan Living Arrangements: Alone [facility] Name of Therapist: NONE  Education Status Is patient currently in school?: No Highest grade of school patient has completed: UNKNOWN  Risk to self with the past 6 months Suicidal Ideation: No (DENIES) Has patient been a risk to self within the past 6 months prior to admission? : No Suicidal Intent: No Has patient had any suicidal intent within the past 6 months prior to admission? : No Is patient at risk for suicide?: No Suicidal Plan?: No Has patient had any suicidal plan within the past 6 months prior to admission? : No Access to Means: No (DENIES) What has been your use of drugs/alcohol within the last 12 months?: DENIES USE OF ANY DRUGS Previous Attempts/Gestures: No Triggers for Past Attempts: None known Intentional Self Injurious Behavior: None Family Suicide History: No Recent stressful life event(s): Other (Comment) (ONGOING AVH, DISORGANIZED  THOUGHT) Persecutory voices/beliefs?: No Depression: No (DENIES ALL SYMPTOMS) Substance abuse history and/or treatment for substance abuse?: Yes (USE OF CANNABIS PER HX) Suicide prevention information given to non-admitted patients: Not applicable  Risk to Others within the past 6 months Homicidal Ideation: No (DENIES) Does patient have any lifetime risk of violence toward others beyond the six months prior to admission? : No Thoughts of Harm to Others: No Current Homicidal Intent: No Current Homicidal Plan: No Access to Homicidal Means: No History of harm to others?: No Assessment of Violence: None Noted Does patient have access to weapons?: No Criminal Charges Pending?: No Does patient have a court date: No Is patient on probation?: No  Psychosis Hallucinations: None noted (DENIES; NO INCIDATION RESPONDING TO INTERNAL STIM) Delusions: Grandiose (MADE MANY STMT INDICATING DELUSIONAL THINKING)  Mental Status Report Appearance/Hygiene: Disheveled, Unremarkable, In scrubs Eye Contact: Good Motor Activity: Freedom of movement Speech: Logical/coherent (SOME DISORGANIZED SPEECH/STATEMENTS) Level of Consciousness: Alert Mood: Pleasant Affect: Blunted Anxiety Level: None Thought Processes: Coherent, Relevant, Flight of Ideas (LABILE) Judgement: Impaired Orientation: Person, Place, Time, Situation Obsessive Compulsive Thoughts/Behaviors: None  Cognitive Functioning Concentration: Decreased Memory: Recent Intact,  Remote Intact IQ: Average Insight: Poor Impulse Control: Fair Appetite: Good Sleep: No Change Total Hours of Sleep: 8 Vegetative Symptoms: None  ADLScreening [facility] Assessment Services) Patient's cognitive ability adequate to safely complete daily activities?: Yes Patient able to express need for assistance with ADLs?: Yes Independently performs ADLs?: Yes (appropriate for developmental age) (NO BARRIERS REPORTED)  Prior Inpatient Therapy Prior Inpatient Therapy:  Yes Prior Therapy Dates: 1X 2017; 1X 2018 (10/23-31) Prior Therapy Facilty/Provider(s): [name] Reason for Treatment: SCHIZOPHRENIA  Prior Outpatient Therapy Prior Outpatient Therapy: No Does patient have an ACCT team?: No Does patient have Intensive In-House Services?  : No Does patient have [name] services? : Yes Does patient have P4CC services?: No  ADL Screening (condition at time of admission) Patient's cognitive ability adequate to safely complete daily activities?: Yes Patient able to express need for assistance with ADLs?: Yes Independently performs ADLs?: Yes (appropriate for developmental age) (NO BARRIERS REPORTED)       Abuse/Neglect Assessment (Assessment to be complete while patient is alone) Physical Abuse: Denies Verbal Abuse: Denies Sexual Abuse: Denies Exploitation of patient/patient's resources: Denies Self-Neglect: Denies     Merchant navy officer (For Healthcare) Does Patient Have a Medical Advance Directive?: No Would patient like information on creating a medical advance directive?: No - Patient declined    Additional Information 1:1 In Past 12 Months?: No CIRT Risk: No Elopement Risk: No Does patient have medical clearance?: Yes     Disposition:  Disposition Initial Assessment Completed for this Encounter: Yes Disposition of Patient: Other dispositions Type of inpatient treatment program: Adolescent Other disposition(s): Other (Comment) (PENDING REVIEW [name] EXTENDER)  This service was provided via telemedicine using a 2-way, interactive audio and video technology.  Names of all persons participating in this telemedicine service and their role in this encounter. Name: Shain Pauwels "[email or web address]" Role: Patient  Name: Beryle Flock Role: Triage Specialist, [facility], [facility]  Name:  Role:   Name:  Role:    Per Nira Conn, NP, recommend observing pt overnight in the ED for safety and stability for possible discharge tomorrow.   Spoke with Mathews Robinsons,  PA and advised of recommendation and rationale.   Beryle Flock, MS, [facility], [facility] Triage Specialist [name] T 06/18/2017 10:58 PM

## 2017-06-19 ENCOUNTER — Encounter (HOSPITAL_COMMUNITY): Payer: Self-pay | Admitting: Registered Nurse

## 2017-06-19 DIAGNOSIS — Z79899 Other long term (current) drug therapy: Secondary | ICD-10-CM | POA: Insufficient documentation

## 2017-06-19 DIAGNOSIS — Z87891 Personal history of nicotine dependence: Secondary | ICD-10-CM | POA: Insufficient documentation

## 2017-06-19 DIAGNOSIS — R1084 Generalized abdominal pain: Secondary | ICD-10-CM | POA: Insufficient documentation

## 2017-06-19 DIAGNOSIS — R4183 Borderline intellectual functioning: Secondary | ICD-10-CM | POA: Insufficient documentation

## 2017-06-19 MED ORDER — IBUPROFEN 400 MG PO TABS: 600.0000 mg | ORAL_TABLET | Freq: Three times a day (TID) | ORAL | Status: DC | PRN

## 2017-06-19 MED ORDER — ARIPIPRAZOLE 10 MG PO TABS
10.0000 mg | ORAL_TABLET | Freq: Every day | ORAL | Status: DC
  Administered 2017-06-19: 10 mg via ORAL
  Filled 2017-06-19: qty 1

## 2017-06-19 MED ORDER — TRAZODONE HCL 50 MG PO TABS: 50.0000 mg | ORAL_TABLET | Freq: Every day | ORAL | Status: DC

## 2017-06-19 MED ORDER — BENZTROPINE MESYLATE 1 MG PO TABS: 1.0000 mg | ORAL_TABLET | Freq: Every day | ORAL | Status: DC

## 2017-06-19 MED ORDER — OXCARBAZEPINE 300 MG PO TABS
300.0000 mg | ORAL_TABLET | Freq: Two times a day (BID) | ORAL | Status: DC
  Administered 2017-06-19: 300 mg via ORAL
  Filled 2017-06-19: qty 1

## 2017-06-19 NOTE — ED Notes (Signed)
TTS at bedside. 

## 2017-06-19 NOTE — ED Notes (Signed)
Patient denies HI, SI and has no behaviors warranting suicide sitter

## 2017-06-19 NOTE — ED Notes (Signed)
Patient was given cookies, peanut butter and coke.

## 2017-06-19 NOTE — Progress Notes (Signed)
Per Assunta FoundShuvon Rankin, NP, the patient does not meet criteria for inpatient treatment. The patient is recommended for discharge once medically cleared.   The patient is psychiatrically cleared at this time.    Otelia LimesJessica Holcomb, RN notified.    Baldo DaubJolan Chessie Neuharth MSW, LCSWA CSW Disposition [phone]

## 2017-06-19 NOTE — Consult Note (Signed)
12:30 PM:  Attempted Telepsych twice but unable to connect.  Nursing dealing with a patient and will set up as soon as they can.  Will attempt to telepsych at later time.      Collin White, 21 y.o., male [facility].  [name] to going to the ED with neck pain and [facility].   Patient seen via telepsych by this provider on 06/19/17.  Chart reviewed and consulted with Dr. Lucianne MussKumar.  On evaluation Collin MoMalcolm Onofrio reports that he went to the ED because he was having paint in his neck.  Patient states "When I was there they [name] I was having a different kind of problem but they [facility] didn't do nothing to help my neck.  When I swallowed it felt like I had to force it and my neck felt like it was going to rupture.   Patient denies suicidal/homicidal ideation, psychosis, and paranoia.  During assessment patient sitting on bed.  Patient alert/oriented x 4, calm/cooprative and pleasant affect.  Patient did not appear to be responding to internal/external stimuli and patient denied suicidal/homicidal ideation, psychosis, and paranoia.     Recommendation:  [facility].    Disposition: Patient psychiatrically cleared. No evidence of imminent risk to self or others at present.   Recommend psychiatric Inpatient admission when medically cleared. Supportive therapy provided about ongoing stressors. Discussed crisis plan, support from social network, calling 911, coming to the Emergency Department, and calling Suicide Hotline.

## 2017-06-19 NOTE — ED Notes (Signed)
Regular Diet was ordered for Lunch. 

## 2017-06-19 NOTE — ED Notes (Signed)
Patient resting in bed without distress.  TTS and vitals to be taken in the AM.  Pending TTS may be able to dc 06/19/17.  Patient denies HI and SI

## 2017-06-20 ENCOUNTER — Encounter (HOSPITAL_COMMUNITY): Payer: Self-pay | Admitting: Emergency Medicine

## 2017-06-20 ENCOUNTER — Emergency Department (HOSPITAL_COMMUNITY)
Admission: EM | Admit: 2017-06-20 | Discharge: 2017-06-20 | Disposition: A | Discharge status: Home / Self Care | Payer: Self-pay | Attending: Emergency Medicine | Admitting: Emergency Medicine

## 2017-06-20 ENCOUNTER — Emergency Department (HOSPITAL_COMMUNITY)
Admission: EM | Admit: 2017-06-20 | Discharge: 2017-06-21 | Disposition: A | Discharge status: Home / Self Care | Payer: Self-pay | Attending: Emergency Medicine | Admitting: Emergency Medicine

## 2017-06-20 DIAGNOSIS — M542 Cervicalgia: Secondary | ICD-10-CM | POA: Insufficient documentation

## 2017-06-20 DIAGNOSIS — Z87891 Personal history of nicotine dependence: Secondary | ICD-10-CM | POA: Insufficient documentation

## 2017-06-20 DIAGNOSIS — R51 Headache: Secondary | ICD-10-CM | POA: Insufficient documentation

## 2017-06-20 DIAGNOSIS — F209 Schizophrenia, unspecified: Secondary | ICD-10-CM | POA: Insufficient documentation

## 2017-06-20 DIAGNOSIS — Z79899 Other long term (current) drug therapy: Secondary | ICD-10-CM | POA: Insufficient documentation

## 2017-06-20 DIAGNOSIS — R1084 Generalized abdominal pain: Secondary | ICD-10-CM

## 2017-06-20 DIAGNOSIS — Z590 Homelessness: Secondary | ICD-10-CM | POA: Insufficient documentation

## 2017-06-20 LAB — URINALYSIS, ROUTINE W REFLEX MICROSCOPIC
BILIRUBIN URINE: NEGATIVE
Glucose, UA: NEGATIVE mg/dL
Hgb urine dipstick: NEGATIVE
KETONES UR: NEGATIVE mg/dL
LEUKOCYTES UA: NEGATIVE
NITRITE: NEGATIVE
PH: 6 (ref 5.0–8.0)
PROTEIN: NEGATIVE mg/dL
Specific Gravity, Urine: 1.011 (ref 1.005–1.030)

## 2017-06-20 LAB — COMPREHENSIVE METABOLIC PANEL
ALBUMIN: 4.9 g/dL (ref 3.5–5.0)
ALK PHOS: 90 U/L (ref 38–126)
ALT: 18 U/L (ref 17–63)
AST: 23 U/L (ref 15–41)
Anion gap: 13 (ref 5–15)
BILIRUBIN TOTAL: 1 mg/dL (ref 0.3–1.2)
BUN: 12 mg/dL (ref 6–20)
CALCIUM: 10 mg/dL (ref 8.9–10.3)
CO2: 23 mmol/L (ref 22–32)
CREATININE: 0.97 mg/dL (ref 0.61–1.24)
Chloride: 103 mmol/L (ref 101–111)
GFR calc Af Amer: >60 mL/min (ref >60)
GLUCOSE: 90 mg/dL (ref 65–99)
POTASSIUM: 3.5 mmol/L (ref 3.5–5.1)
Sodium: 139 mmol/L (ref 135–145)
TOTAL PROTEIN: 8.5 g/dL — AB (ref 6.5–8.1)

## 2017-06-20 LAB — CBC
HEMATOCRIT: 43.9 % (ref 39.0–52.0)
Hemoglobin: 15.3 g/dL (ref 13.0–17.0)
MCH: 29.4 pg (ref 26.0–34.0)
MCHC: 34.9 g/dL (ref 30.0–36.0)
MCV: 84.3 fL (ref 78.0–100.0)
Platelets: 190 10*3/uL (ref 150–400)
RBC: 5.21 MIL/uL (ref 4.22–5.81)
RDW: 14 % (ref 11.5–15.5)
WBC: 9.6 10*3/uL (ref 4.0–10.5)

## 2017-06-20 LAB — LIPASE, BLOOD: Lipase: 26 U/L (ref 11–51)

## 2017-06-20 MED ORDER — ONDANSETRON 8 MG PO TBDP
8.0000 mg | ORAL_TABLET | Freq: Once | ORAL | Status: AC
  Administered 2017-06-20: 8 mg via ORAL
  Filled 2017-06-20: qty 1

## 2017-06-20 MED ORDER — ONDANSETRON 4 MG PO TBDP: 4.0000 mg | ORAL_TABLET | Freq: Three times a day (TID) | ORAL | 0 refills | Status: DC | PRN

## 2017-06-20 NOTE — ED Triage Notes (Signed)
Came in with complaint of headache and neck pain at 8/10. Ambulatory, denied fever. Denied dizziness nor N/V. Was seen here this morning for abdominal pain.

## 2017-06-20 NOTE — ED Notes (Signed)
Attempted to draw labs, stuck pt in his hand, pt pulled his arm away
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
## 2017-06-20 NOTE — ED Triage Notes (Signed)
Patient complaining of lower abdominal pain. Patient states it hurts.

## 2017-06-21 ENCOUNTER — Encounter (HOSPITAL_COMMUNITY): Payer: Self-pay | Admitting: Emergency Medicine

## 2017-06-21 ENCOUNTER — Emergency Department (HOSPITAL_COMMUNITY)
Admission: EM | Admit: 2017-06-21 | Discharge: 2017-06-21 | Disposition: A | Discharge status: Home / Self Care | Payer: Self-pay | Attending: Emergency Medicine | Admitting: Emergency Medicine

## 2017-06-21 DIAGNOSIS — R4183 Borderline intellectual functioning: Secondary | ICD-10-CM | POA: Insufficient documentation

## 2017-06-21 DIAGNOSIS — M542 Cervicalgia: Secondary | ICD-10-CM | POA: Insufficient documentation

## 2017-06-21 DIAGNOSIS — Z79899 Other long term (current) drug therapy: Secondary | ICD-10-CM | POA: Insufficient documentation

## 2017-06-21 DIAGNOSIS — Z87891 Personal history of nicotine dependence: Secondary | ICD-10-CM | POA: Insufficient documentation

## 2017-06-21 MED ORDER — IBUPROFEN 800 MG PO TABS
800.0000 mg | ORAL_TABLET | Freq: Once | ORAL | Status: AC
  Administered 2017-06-21: 800 mg via ORAL
  Filled 2017-06-21: qty 1
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
## 2017-06-21 NOTE — ED Notes (Signed)
Bed: [facility] Expected date:  Expected time:  Means of arrival:  Comments: No bed  

## 2017-06-21 NOTE — Discharge Instructions (Signed)
Return if any problems.

## 2017-06-21 NOTE — Discharge Instructions (Signed)
Continue to take your meds.   See your doctor. Go to a shelter  Return to ER if you have neck stiffness, fever, vomiting.

## 2017-06-21 NOTE — ED Triage Notes (Signed)
Patient c/o neck pain x3 days. Ambulatory.

## 2017-06-22 ENCOUNTER — Encounter (HOSPITAL_COMMUNITY): Payer: Self-pay | Admitting: Family Medicine

## 2017-06-22 DIAGNOSIS — Z87891 Personal history of nicotine dependence: Secondary | ICD-10-CM | POA: Insufficient documentation

## 2017-06-22 DIAGNOSIS — M542 Cervicalgia: Secondary | ICD-10-CM | POA: Insufficient documentation

## 2017-06-22 NOTE — ED Triage Notes (Signed)
Patient is complaining of neck pain that started in September. Patient denies any recent injury. Reports he has been evaluated for this pain before. Denies numbness, tingling, or loss of bowel/bladder.

## 2017-06-23 ENCOUNTER — Encounter (HOSPITAL_COMMUNITY): Payer: Self-pay | Admitting: Emergency Medicine

## 2017-06-23 ENCOUNTER — Emergency Department (HOSPITAL_COMMUNITY): Payer: Self-pay

## 2017-06-23 ENCOUNTER — Emergency Department (HOSPITAL_COMMUNITY)
Admission: EM | Admit: 2017-06-23 | Discharge: 2017-06-23 | Disposition: A | Discharge status: Home / Self Care | Payer: Self-pay | Attending: Emergency Medicine | Admitting: Emergency Medicine

## 2017-06-23 ENCOUNTER — Emergency Department (HOSPITAL_COMMUNITY)
Admission: EM | Admit: 2017-06-23 | Discharge: 2017-06-24 | Disposition: A | Discharge status: Home / Self Care | Payer: Self-pay | Attending: Emergency Medicine | Admitting: Emergency Medicine

## 2017-06-23 DIAGNOSIS — G8929 Other chronic pain: Secondary | ICD-10-CM | POA: Insufficient documentation

## 2017-06-23 DIAGNOSIS — M549 Dorsalgia, unspecified: Secondary | ICD-10-CM | POA: Insufficient documentation

## 2017-06-23 DIAGNOSIS — Z87891 Personal history of nicotine dependence: Secondary | ICD-10-CM | POA: Insufficient documentation

## 2017-06-23 DIAGNOSIS — M542 Cervicalgia: Secondary | ICD-10-CM

## 2017-06-23 MED ORDER — NAPROXEN 500 MG PO TABS
500.0000 mg | ORAL_TABLET | Freq: Once | ORAL | Status: AC
  Administered 2017-06-23: 500 mg via ORAL
  Filled 2017-06-23: qty 1

## 2017-06-23 MED ORDER — METHOCARBAMOL 500 MG PO TABS
1000.0000 mg | ORAL_TABLET | Freq: Once | ORAL | Status: AC
  Administered 2017-06-23: 1000 mg via ORAL
  Filled 2017-06-23: qty 2

## 2017-06-23 MED ORDER — METHOCARBAMOL 500 MG PO TABS: 500.0000 mg | ORAL_TABLET | Freq: Two times a day (BID) | ORAL | 0 refills | Status: DC

## 2017-06-23 MED ORDER — NAPROXEN 500 MG PO TABS: 500.0000 mg | ORAL_TABLET | Freq: Two times a day (BID) | ORAL | 0 refills | Status: DC

## 2017-06-23 MED ORDER — LIDOCAINE 5 % EX PTCH: 1.0000 | MEDICATED_PATCH | CUTANEOUS | 0 refills | Status: DC
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
## 2017-06-23 NOTE — ED Notes (Signed)
Bed: W[facility]6 Expected date:  Expected time:  Means of arrival:  Comments: 

## 2017-06-23 NOTE — ED Notes (Signed)
PA at bedside.

## 2017-06-23 NOTE — Discharge Instructions (Addendum)
There were no acute abnormalities on the x-rays. Take it easy, but do not lay around too much as this may make any stiffness worse.  °Antiinflammatory medications: Take 600 mg of ibuprofen every 6 hours or 440 mg (over the counter dose) to 500 mg (prescription dose) of naproxen every 12 hours for the next 3 days. After this time, these medications may be used as needed for pain. Take these medications with food to avoid upset stomach. Choose only one of these medications, do not take them together.  °Tylenol: Should you continue to have additional pain while taking the ibuprofen or naproxen, you may add in tylenol as needed. Your daily total maximum amount of tylenol from all sources should be limited to 4000mg/day for persons without liver problems, or 2000mg/day for those with liver problems. °Muscle relaxer: Robaxin is a muscle relaxer and may help loosen stiff muscles. Do not take the Robaxin while driving or performing other dangerous activities.  °Lidocaine patches: These are available via either prescription or over-the-counter. The over-the-counter option may be more economical one and are likely just as effective. There are multiple over-the-counter brands, such as Salonpas. °Exercises: Be sure to perform the attached exercises starting with three times a week and working up to performing them daily. This is an essential part of preventing long term problems.  ° °Follow up with a primary care provider for any future management of these complaints. °

## 2017-06-24 ENCOUNTER — Emergency Department (HOSPITAL_COMMUNITY): Payer: Self-pay

## 2017-06-24 ENCOUNTER — Encounter (HOSPITAL_COMMUNITY): Payer: Self-pay

## 2017-06-24 ENCOUNTER — Emergency Department (HOSPITAL_COMMUNITY)
Admission: EM | Admit: 2017-06-24 | Discharge: 2017-06-24 | Disposition: A | Discharge status: Home / Self Care | Payer: Self-pay | Attending: Emergency Medicine | Admitting: Emergency Medicine

## 2017-06-24 ENCOUNTER — Encounter (HOSPITAL_COMMUNITY): Payer: Self-pay | Admitting: *Deleted

## 2017-06-24 DIAGNOSIS — M79671 Pain in right foot: Secondary | ICD-10-CM | POA: Insufficient documentation

## 2017-06-24 DIAGNOSIS — F209 Schizophrenia, unspecified: Secondary | ICD-10-CM | POA: Insufficient documentation

## 2017-06-24 DIAGNOSIS — Z87891 Personal history of nicotine dependence: Secondary | ICD-10-CM | POA: Insufficient documentation

## 2017-06-24 DIAGNOSIS — F1290 Cannabis use, unspecified, uncomplicated: Secondary | ICD-10-CM | POA: Insufficient documentation

## 2017-06-24 DIAGNOSIS — R4183 Borderline intellectual functioning: Secondary | ICD-10-CM | POA: Insufficient documentation

## 2017-06-24 MED ORDER — IBUPROFEN 800 MG PO TABS
800.0000 mg | ORAL_TABLET | Freq: Once | ORAL | Status: AC
  Administered 2017-06-24: 800 mg via ORAL
  Filled 2017-06-24: qty 1

## 2017-06-24 MED ORDER — IBUPROFEN 800 MG PO TABS: 800.0000 mg | ORAL_TABLET | Freq: Three times a day (TID) | ORAL | 0 refills | Status: DC

## 2017-06-24 NOTE — ED Notes (Signed)
Pt reports pain to bottom of right foot, no injury noted. Pt reports he "stepped on something a week ago and heard a pop and felt something". No viable trauma.

## 2017-06-24 NOTE — Discharge Instructions (Signed)
The x-ray of your right foot does not show a fracture, dislocation or foreign body.  Please treat your pain with 800 mg ibuprofen every 6 hours.  I have written you a prescription for this.  Return to the emergency department if you have numbness in which she cannot feel your right foot, have fever/swelling/redness in the foot or have a new injury.  Please also return for any new or worsening symptoms.
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
## 2017-06-24 NOTE — ED Triage Notes (Signed)
Pt arrived by [facility] from [facility]. Pt reports stepping on something and feeling a pop to right foot, still having pain. No acute distress is noted.

## 2017-06-24 NOTE — ED Triage Notes (Signed)
Pt complains of back pain around his spine like he's been to the gym

## 2017-06-24 NOTE — ED Notes (Signed)
Patient transported to X-ray 
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
## 2017-06-24 NOTE — ED Provider Notes (Signed)
[facility] Provider [facility]   CSN: [id number] Arrival date & time: 06/24/17  1624     History   Chief Complaint Chief Complaint  Patient presents with  . Foot Pain    HPI Collin White is a 21 y.o. male.  HPI   Collin White is a 21 year old male with a history of schizophrenia, PTSD, seizures who presents emergency department for evaluation of right foot pain.  Patient states that he stepped on a metal object about a week ago and has had ongoing pain ever since.  Denies wound or laceration.  His pain is located at the bottom of his foot.  States that it is 8/10 in severity, constant and "throbbing" in nature.  It is worsened with putting a shoe on or ambulation.  He tried icing the foot last week without significant relief.  He has not taken any over-the-counter medications to help with his pain.  He denies numbness, weakness, fever or injury elsewhere.  He is able to ambulate independently despite pain.  Past Medical History:  Diagnosis Date  . Psychoses (HCC)   . Schizophrenia (HCC)   . Seizures [name](HCC)     Patient Active Problem List   Diagnosis Date Noted  . Schizophrenia, paranoid type (HCC) 06/08/2017  . 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 Psychoses (HCC)   . Paranoid schizophrenia (HCC) 09/29/2015  . Cannabis use disorder, moderate, in early remission (HCC) 09/26/2015  . History of posttraumatic stress disorder (PTSD) 09/26/2015    Past Surgical History:  Procedure Laterality Date  . abd surgery s/p traumatic event    . facial reconstructive surgery    . NO PAST SURGERIES  patient stated he had facial reconstruction surgery as a child   . tumor removed from head          Home Medications    Prior to Admission medications   Medication Sig Start Date End Date Taking? Authorizing Provider  ARIPiprazole (ABILIFY) 10 MG tablet Take 1 tablet (10 mg total) by mouth daily. For mood control Patient not taking:  Reported on 06/20/2017 06/17/17   Armandina StammerNwoko, Agnes I, NP  benztropine (COGENTIN) 1 MG tablet Take 1 tablet (1 mg total) by mouth at bedtime. For prevention of drug induced tremors Patient not taking: Reported on 06/20/2017 06/17/17   Armandina StammerNwoko, Agnes I, NP  ibuprofen (ADVIL,MOTRIN) 200 MG tablet Take 400 mg by mouth every 6 (six) hours as needed for moderate pain.    [provider]  ibuprofen (ADVIL,MOTRIN) 800 MG tablet Take 1 tablet (800 mg total) 3 (three) times daily by mouth. 06/24/17   Kellie ShropshireShrosbree, Saban Heinlen J, PA-C  lidocaine (LIDODERM) 5 % Place 1 patch daily onto the skin. Remove & Discard patch within 12 hours or as directed by MD 06/23/17   Joy, Shawn C, PA-C  methocarbamol (ROBAXIN) 500 MG tablet Take 1 tablet (500 mg total) 2 (two) times daily by mouth. 06/23/17   Joy, Shawn C, PA-C  naproxen (NAPROSYN) 500 MG tablet Take 1 tablet (500 mg total) 2 (two) times daily by mouth. 06/23/17   Joy, Shawn C, PA-C  ondansetron (ZOFRAN ODT) 4 MG disintegrating tablet Take 1 tablet (4 mg total) by mouth every 8 (eight) hours as needed for nausea or vomiting. Patient not taking: Reported on 06/21/2017 06/20/17   Roxy HorsemanBrowning, Robert, PA-C  Oxcarbazepine (TRILEPTAL) 300 MG tablet Take 1 tablet (300 mg total) by mouth 2 (two) times daily. For mood stabilization Patient not taking: Reported on 06/20/2017 06/17/17  Armandina StammerNwoko, Agnes I, NP  traZODone (DESYREL) 50 MG tablet Take 1 tablet (50 mg total) by mouth at bedtime. For sleep Patient not taking: Reported on 06/20/2017 06/17/17   Sanjuana KavaNwoko, Agnes I, NP    Family History Family History  Problem Relation Age of Onset  . Hypertension Other   . Mental illness Neg Hx     Social History Social History   Tobacco Use  . Smoking status: Former Smoker    Types: Cigarettes  . Smokeless tobacco: Never Used  Substance Use Topics  . Alcohol use: No  . Drug use: No     Allergies   Patient has no known allergies.   Review of Systems Review of Systems  Constitutional:  Negative for fever.  Musculoskeletal: Positive for arthralgias (right foot). Negative for gait problem and joint swelling.  Skin: Negative for wound.     Physical Exam Updated Vital Signs BP 128/78 (BP Location: Left Arm)   Pulse 87   Temp 98.4 F (36.9 C) (Oral)   Resp 17   Ht 6' (1.829 m)   Wt 65.8 kg (145 lb)   SpO2 100%   BMI 19.67 kg/m   Physical Exam  Constitutional: He is oriented to person, place, and time. He appears well-developed and well-nourished. No distress.  HENT:  Head: Normocephalic and atraumatic.  Eyes: Right eye exhibits no discharge. Left eye exhibits no discharge.  Pulmonary/Chest: Effort normal. No respiratory distress.  Musculoskeletal:  Plantar aspect of foot grossly tender to touch. No tenderness over the medial or lateral malleolus. No swelling. Full ROM.  No erythema, ecchymosis, or deformity appreciated. No break in skin.  No pain to fifth metatarsal area or navicular region. Achilles intact. Good pedal pulse and cap refill of toes.    Neurological: He is alert and oriented to person, place, and time. Coordination normal.  Distal sensation to light touch intact in bilateral lower extremities.  Skin: Skin is warm and dry. Capillary refill takes less than 2 seconds. He is not diaphoretic.  Psychiatric: He has a normal mood and affect. His behavior is normal.  Nursing note and vitals reviewed.    ED Treatments / Results  Labs (all labs ordered are listed, but only abnormal results are displayed) Labs Reviewed - No data to display  EKG  EKG Interpretation None       Radiology Dg Cervical Spine Complete  Result Date: 06/23/2017 CLINICAL DATA:  Posterior neck pain for several months, no known injury, initial encounter EXAM: CERVICAL SPINE - COMPLETE 4+ VIEW COMPARISON:  None. FINDINGS: There is no evidence of cervical spine fracture or prevertebral soft tissue swelling. Alignment is normal. No other significant bone abnormalities are  identified. IMPRESSION: No acute abnormality noted. Electronically Signed   By: Alcide CleverMark  Lukens M.D.   On: 06/23/2017 07:44   Dg Foot Complete Right  Result Date: 06/24/2017 CLINICAL DATA:  Felt a pop in RIGHT foot 1 stepped on something, still having pain, pain at bottom of foot near arch EXAM: RIGHT FOOT COMPLETE - 3+ VIEW COMPARISON:  03/06/2015 FINDINGS: Osseous mineralization normal. Joint spaces preserved. No acute fracture, dislocation or bone destruction. No radiopaque foreign bodies or soft tissue abnormality seen. IMPRESSION: Normal exam. Electronically Signed   By: Ulyses SouthwardMark  Boles M.D.   On: 06/24/2017 17:38    Procedures Procedures (including critical care time)  Medications Ordered in ED Medications  ibuprofen (ADVIL,MOTRIN) tablet 800 mg (800 mg Oral Given 06/24/17 1743)     Initial Impression / Assessment and  Plan / ED Course  I have reviewed the triage vital signs and the nursing notes.  Pertinent labs & imaging results that were available during my care of the patient were reviewed by me and considered in my medical decision making (see chart for details).     X-ray of right foot without foreign body, fracture, soft tissue swelling or dislocation.  Foot is neuro vascularly intact on exam. Suspect that this is plantar fasciitis given distribution of pain along the plantar fascia. Have counseled patient on use of NSAIDs and RICE protocol.  He is able to ambulate independently prior to discharge.  Have discussed return precautions and patient agrees and voiced understanding.  Final Clinical Impressions(s) / ED Diagnoses   Final diagnoses:  Right foot pain    ED Discharge Orders        Ordered    ibuprofen (ADVIL,MOTRIN) 800 MG tablet  3 times daily     06/24/17 1754       Kellie Shropshire, PA-C 06/24/17 1756    Tegeler, Canary Brim, MD 06/24/17 2318

## 2017-06-25 ENCOUNTER — Other Ambulatory Visit: Payer: Self-pay

## 2017-06-25 ENCOUNTER — Emergency Department (HOSPITAL_COMMUNITY)
Admission: EM | Admit: 2017-06-25 | Discharge: 2017-06-25 | Disposition: A | Discharge status: Home / Self Care | Payer: Self-pay | Attending: Emergency Medicine | Admitting: Emergency Medicine

## 2017-06-25 DIAGNOSIS — M79671 Pain in right foot: Secondary | ICD-10-CM | POA: Insufficient documentation

## 2017-06-25 DIAGNOSIS — Z87891 Personal history of nicotine dependence: Secondary | ICD-10-CM | POA: Insufficient documentation

## 2017-06-25 MED ORDER — IBUPROFEN 800 MG PO TABS
800.0000 mg | ORAL_TABLET | Freq: Once | ORAL | Status: AC
  Administered 2017-06-25: 800 mg via ORAL
  Filled 2017-06-25: qty 1

## 2017-06-25 NOTE — ED Provider Notes (Signed)
[facility]-EMERGENCY DEPT Provider Note   CSN: [id number] Arrival date & time: 06/25/17  1858     History   Chief Complaint Chief Complaint  Patient presents with  . Foot Pain    right foot    HPI Collin White is a 21 y.o. male.  Pt complains of foot pain.  Pt reports he got medication yesterday that helped.  Pt reports he tried exercises but they did not help today.    The history is provided by the patient. No language interpreter was used.  Foot Pain  This is a recurrent problem. The current episode started more than 1 week ago. The problem occurs constantly. The problem has not changed since onset.Nothing aggravates the symptoms. Nothing relieves the symptoms. He has tried nothing for the symptoms. The treatment provided no relief.    Past Medical History:  Diagnosis Date  . Psychoses (HCC)   . Schizophrenia (HCC)   . Seizures [facility](HCC)     Patient Active Problem List   Diagnosis Date Noted  . Schizophrenia, paranoid type (HCC) 06/08/2017  . 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 Psychoses (HCC)   . Paranoid schizophrenia (HCC) 09/29/2015  . Cannabis use disorder, moderate, in early remission (HCC) 09/26/2015  . History of posttraumatic stress disorder (PTSD) 09/26/2015    Past Surgical History:  Procedure Laterality Date  . abd surgery s/p traumatic event    . facial reconstructive surgery    . NO PAST SURGERIES  patient stated he had facial reconstruction surgery as a child   . tumor removed from head          Home Medications    Prior to Admission medications   Medication Sig Start Date End Date Taking? Authorizing Provider  ibuprofen (ADVIL,MOTRIN) 200 MG tablet Take 400 mg by mouth every 6 (six) hours as needed for moderate pain.   Yes [provider]  ARIPiprazole (ABILIFY) 10 MG tablet Take 1 tablet (10 mg total) by mouth daily. For mood control Patient not taking: Reported on 06/20/2017 06/17/17   Armandina StammerNwoko,  Agnes I, NP  benztropine (COGENTIN) 1 MG tablet Take 1 tablet (1 mg total) by mouth at bedtime. For prevention of drug induced tremors Patient not taking: Reported on 06/20/2017 06/17/17   Armandina StammerNwoko, Agnes I, NP  ibuprofen (ADVIL,MOTRIN) 800 MG tablet Take 1 tablet (800 mg total) 3 (three) times daily by mouth. Patient not taking: Reported on 06/25/2017 06/24/17   Kellie ShropshireShrosbree, Emily J, PA-C  lidocaine (LIDODERM) 5 % Place 1 patch daily onto the skin. Remove & Discard patch within 12 hours or as directed by MD Patient not taking: Reported on 06/25/2017 06/23/17   Joy, Hillard DankerShawn C, PA-C  methocarbamol (ROBAXIN) 500 MG tablet Take 1 tablet (500 mg total) 2 (two) times daily by mouth. Patient not taking: Reported on 06/25/2017 06/23/17   Joy, Ines BloomerShawn C, PA-C  naproxen (NAPROSYN) 500 MG tablet Take 1 tablet (500 mg total) 2 (two) times daily by mouth. Patient not taking: Reported on 06/25/2017 06/23/17   Joy, Shawn C, PA-C  ondansetron (ZOFRAN ODT) 4 MG disintegrating tablet Take 1 tablet (4 mg total) by mouth every 8 (eight) hours as needed for nausea or vomiting. Patient not taking: Reported on 06/21/2017 06/20/17   Roxy HorsemanBrowning, Robert, PA-C  Oxcarbazepine (TRILEPTAL) 300 MG tablet Take 1 tablet (300 mg total) by mouth 2 (two) times daily. For mood stabilization Patient not taking: Reported on 06/20/2017 06/17/17   Armandina StammerNwoko, Agnes I, NP  traZODone (DESYREL) 50  MG tablet Take 1 tablet (50 mg total) by mouth at bedtime. For sleep Patient not taking: Reported on 06/20/2017 06/17/17   Sanjuana KavaNwoko, Agnes I, NP    Family History Family History  Problem Relation Age of Onset  . Hypertension Other   . Mental illness Neg Hx     Social History Social History   Tobacco Use  . Smoking status: Former Smoker    Types: Cigarettes  . Smokeless tobacco: Never Used  Substance Use Topics  . Alcohol use: No  . Drug use: No     Allergies   Patient has no known allergies.   Review of Systems Review of Systems  All other systems  reviewed and are negative.    Physical Exam Updated Vital Signs BP (!) 117/93 (BP Location: Left Arm)   Pulse 78   Temp 98.3 F (36.8 C) (Oral)   Resp 18   SpO2 99%   Physical Exam  Constitutional: He is oriented to person, place, and time. He appears well-developed and well-nourished.  Musculoskeletal: He exhibits tenderness.  Tender lateral foot   Neurological: He is alert and oriented to person, place, and time.  Skin: Skin is warm.  Psychiatric: He has a normal mood and affect.  Nursing note and vitals reviewed.    ED Treatments / Results  Labs (all labs ordered are listed, but only abnormal results are displayed) Labs Reviewed - No data to display  EKG  EKG Interpretation None       Radiology Dg Foot Complete Right  Result Date: 06/24/2017 CLINICAL DATA:  Felt a pop in RIGHT foot 1 stepped on something, still having pain, pain at bottom of foot near arch EXAM: RIGHT FOOT COMPLETE - 3+ VIEW COMPARISON:  03/06/2015 FINDINGS: Osseous mineralization normal. Joint spaces preserved. No acute fracture, dislocation or bone destruction. No radiopaque foreign bodies or soft tissue abnormality seen. IMPRESSION: Normal exam. Electronically Signed   By: Ulyses SouthwardMark  Boles M.D.   On: 06/24/2017 17:38    Procedures Procedures (including critical care time)  Medications Ordered in ED Medications  ibuprofen (ADVIL,MOTRIN) tablet 800 mg (800 mg Oral Given 06/25/17 2052)     Initial Impression / Assessment and Plan / ED Course  I have reviewed the triage vital signs and the nursing notes.  Pertinent labs & imaging results that were available during my care of the patient were reviewed by me and considered in my medical decision making (see chart for details).       Final Clinical Impressions(s) / ED Diagnoses   Final diagnoses:  Foot pain, right    ED Discharge Orders    None    An After Visit Summary was printed and given to the patient.    Osie CheeksSofia, Waneta Fitting K,  PA-C 06/25/17 2212    Gerhard MunchLockwood, Robert, MD 06/25/17 [phone]

## 2017-06-25 NOTE — ED Notes (Signed)
ED Provider at bedside. 

## 2017-06-25 NOTE — ED Triage Notes (Signed)
Pt here to be seen for right foot pain states [facility]

## 2017-06-26 ENCOUNTER — Other Ambulatory Visit: Payer: Self-pay

## 2017-06-26 ENCOUNTER — Emergency Department (HOSPITAL_COMMUNITY): Payer: Self-pay

## 2017-06-26 ENCOUNTER — Emergency Department (HOSPITAL_COMMUNITY)
Admission: EM | Admit: 2017-06-26 | Discharge: 2017-06-29 | Disposition: A | Discharge status: Other Acute Inpatient Hospital | Payer: Self-pay | Attending: Emergency Medicine | Admitting: Emergency Medicine

## 2017-06-26 ENCOUNTER — Encounter (HOSPITAL_COMMUNITY): Payer: Self-pay | Admitting: Emergency Medicine

## 2017-06-26 DIAGNOSIS — F22 Delusional disorders: Secondary | ICD-10-CM | POA: Insufficient documentation

## 2017-06-26 DIAGNOSIS — F29 Unspecified psychosis not due to a substance or known physiological condition: Secondary | ICD-10-CM | POA: Insufficient documentation

## 2017-06-26 DIAGNOSIS — Z87891 Personal history of nicotine dependence: Secondary | ICD-10-CM | POA: Insufficient documentation

## 2017-06-26 DIAGNOSIS — N5082 Scrotal pain: Secondary | ICD-10-CM | POA: Insufficient documentation

## 2017-06-26 DIAGNOSIS — Z79899 Other long term (current) drug therapy: Secondary | ICD-10-CM | POA: Insufficient documentation

## 2017-06-26 DIAGNOSIS — F200 Paranoid schizophrenia: Secondary | ICD-10-CM | POA: Diagnosis present

## 2017-06-26 LAB — BASIC METABOLIC PANEL
Anion gap: 7 (ref 5–15)
BUN: 13 mg/dL (ref 6–20)
CO2: 27 mmol/L (ref 22–32)
CREATININE: 0.98 mg/dL (ref 0.61–1.24)
Calcium: 9.7 mg/dL (ref 8.9–10.3)
Chloride: 107 mmol/L (ref 101–111)
GFR calc Af Amer: >60 mL/min (ref >60)
Glucose, Bld: 79 mg/dL (ref 65–99)
Potassium: 4.1 mmol/L (ref 3.5–5.1)
SODIUM: 141 mmol/L (ref 135–145)

## 2017-06-26 LAB — URINALYSIS, ROUTINE W REFLEX MICROSCOPIC
BILIRUBIN URINE: NEGATIVE
GLUCOSE, UA: NEGATIVE mg/dL
HGB URINE DIPSTICK: NEGATIVE
KETONES UR: NEGATIVE mg/dL
Leukocytes, UA: NEGATIVE
Nitrite: NEGATIVE
PROTEIN: NEGATIVE mg/dL
Specific Gravity, Urine: 1.02 (ref 1.005–1.030)
pH: 6 (ref 5.0–8.0)

## 2017-06-26 LAB — CBC WITH DIFFERENTIAL/PLATELET
BASOS ABS: 0 10*3/uL (ref 0.0–0.1)
BASOS PCT: 0 %
EOS PCT: 0 %
Eosinophils Absolute: 0 10*3/uL (ref 0.0–0.7)
HCT: 41.9 % (ref 39.0–52.0)
Hemoglobin: 14.1 g/dL (ref 13.0–17.0)
Lymphocytes Relative: 28 %
Lymphs Abs: 1.8 10*3/uL (ref 0.7–4.0)
MCH: 28.7 pg (ref 26.0–34.0)
MCHC: 33.7 g/dL (ref 30.0–36.0)
MCV: 85.2 fL (ref 78.0–100.0)
MONO ABS: 0.6 10*3/uL (ref 0.1–1.0)
Monocytes Relative: 10 %
Neutro Abs: 3.9 10*3/uL (ref 1.7–7.7)
Neutrophils Relative %: 62 %
PLATELETS: 189 10*3/uL (ref 150–400)
RBC: 4.92 MIL/uL (ref 4.22–5.81)
RDW: 13.6 % (ref 11.5–15.5)
WBC: 6.3 10*3/uL (ref 4.0–10.5)

## 2017-06-26 LAB — RAPID URINE DRUG SCREEN, HOSP PERFORMED
Amphetamines: NOT DETECTED
BENZODIAZEPINES: NOT DETECTED
Barbiturates: NOT DETECTED
Cocaine: NOT DETECTED
Opiates: NOT DETECTED
Tetrahydrocannabinol: NOT DETECTED

## 2017-06-26 LAB — ETHANOL

## 2017-06-26 MED ORDER — TRAZODONE HCL 50 MG PO TABS
50.0000 mg | ORAL_TABLET | Freq: Every day | ORAL | Status: DC
  Administered 2017-06-26 – 2017-06-29 (×4): 50 mg via ORAL
  Filled 2017-06-26 (×4): qty 1

## 2017-06-26 MED ORDER — OXCARBAZEPINE 300 MG PO TABS
300.0000 mg | ORAL_TABLET | Freq: Two times a day (BID) | ORAL | Status: DC
Start: 2017-06-26 — End: 2017-06-29
  Administered 2017-06-26 – 2017-06-29 (×7): 300 mg via ORAL
  Filled 2017-06-26 (×7): qty 1

## 2017-06-26 MED ORDER — BENZTROPINE MESYLATE 1 MG PO TABS
1.0000 mg | ORAL_TABLET | Freq: Every day | ORAL | Status: DC
  Administered 2017-06-26 – 2017-06-29 (×4): 1 mg via ORAL
  Filled 2017-06-26 (×4): qty 1

## 2017-06-26 MED ORDER — ARIPIPRAZOLE 10 MG PO TABS
10.0000 mg | ORAL_TABLET | Freq: Every day | ORAL | Status: DC
  Administered 2017-06-26 – 2017-06-28 (×3): 10 mg via ORAL
  Filled 2017-06-26 (×3): qty 1

## 2017-06-26 NOTE — [facility] Assessment (Signed)
[name] Assessment Progress Note Case was staffed with Lord DNP who recommended patient be admitted inpatient as appropriate bed placement is investigated      

## 2017-06-26 NOTE — ED Triage Notes (Signed)
Per EMS pt verbalizes "burning with peeing causing a headache."

## 2017-06-26 NOTE — ED Provider Notes (Signed)
[facility]-EMERGENCY DEPT Provider Note   CSN: [id number] Arrival date & time: 06/26/17  1410     History   Chief Complaint Chief Complaint  Patient presents with  . Dysuria    HPI Collin White is a 21 y.o. male.  The history is provided by the patient. The history is limited by the condition of the patient (Psychosis).  Dysuria    Pt was seen at 1700. Pt presents to the ED today c/o "groin pain" which "went up into my head."  Pt denies he has a penis or a vagina. States he has "man bones." Pt appears disheveled with tangential/flight of ideas.  Past Medical History:  Diagnosis Date  . Psychoses (HCC)   . Schizophrenia (HCC)   . Seizures [facility](HCC)     Patient Active Problem List   Diagnosis Date Noted  . Schizophrenia, paranoid type (HCC) 06/08/2017  . 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 Psychoses (HCC)   . Paranoid schizophrenia (HCC) 09/29/2015  . Cannabis use disorder, moderate, in early remission (HCC) 09/26/2015  . History of posttraumatic stress disorder (PTSD) 09/26/2015    Past Surgical History:  Procedure Laterality Date  . abd surgery s/p traumatic event    . facial reconstructive surgery    . NO PAST SURGERIES  patient stated he had facial reconstruction surgery as a child   . tumor removed from head          Home Medications    Prior to Admission medications   Medication Sig Start Date End Date Taking? Authorizing Provider  ARIPiprazole (ABILIFY) 10 MG tablet Take 1 tablet (10 mg total) by mouth daily. For mood control Patient not taking: Reported on 06/20/2017 06/17/17   Armandina StammerNwoko, Agnes I, NP  benztropine (COGENTIN) 1 MG tablet Take 1 tablet (1 mg total) by mouth at bedtime. For prevention of drug induced tremors Patient not taking: Reported on 06/20/2017 06/17/17   Armandina StammerNwoko, Agnes I, NP  ibuprofen (ADVIL,MOTRIN) 200 MG tablet Take 400 mg by mouth every 6 (six) hours as needed for moderate pain.    [provider]  ibuprofen (ADVIL,MOTRIN) 800 MG tablet Take 1 tablet (800 mg total) 3 (three) times daily by mouth. Patient not taking: Reported on 06/25/2017 06/24/17   Kellie ShropshireShrosbree, Emily J, PA-C  lidocaine (LIDODERM) 5 % Place 1 patch daily onto the skin. Remove & Discard patch within 12 hours or as directed by MD Patient not taking: Reported on 06/25/2017 06/23/17   Joy, Hillard DankerShawn C, PA-C  methocarbamol (ROBAXIN) 500 MG tablet Take 1 tablet (500 mg total) 2 (two) times daily by mouth. Patient not taking: Reported on 06/25/2017 06/23/17   Joy, Ines BloomerShawn C, PA-C  naproxen (NAPROSYN) 500 MG tablet Take 1 tablet (500 mg total) 2 (two) times daily by mouth. Patient not taking: Reported on 06/25/2017 06/23/17   Joy, Shawn C, PA-C  ondansetron (ZOFRAN ODT) 4 MG disintegrating tablet Take 1 tablet (4 mg total) by mouth every 8 (eight) hours as needed for nausea or vomiting. Patient not taking: Reported on 06/21/2017 06/20/17   Roxy HorsemanBrowning, Robert, PA-C  Oxcarbazepine (TRILEPTAL) 300 MG tablet Take 1 tablet (300 mg total) by mouth 2 (two) times daily. For mood stabilization Patient not taking: Reported on 06/20/2017 06/17/17   Armandina StammerNwoko, Agnes I, NP  traZODone (DESYREL) 50 MG tablet Take 1 tablet (50 mg total) by mouth at bedtime. For sleep Patient not taking: Reported on 06/20/2017 06/17/17   Sanjuana KavaNwoko, Agnes I, NP    Family History  Family History  Problem Relation Age of Onset  . Hypertension Other   . Mental illness Neg Hx     Social History Social History   Tobacco Use  . Smoking status: Former Smoker    Types: Cigarettes  . Smokeless tobacco: Never Used  Substance Use Topics  . Alcohol use: No  . Drug use: No     Allergies   Patient has no known allergies.   Review of Systems Review of Systems  Unable to perform ROS: Psychiatric disorder  Genitourinary: Positive for dysuria.     Physical Exam Updated Vital Signs BP (!) 131/91   Pulse 75   Temp 97.8 F (36.6 C)   Resp 14   SpO2 100%   Physical  Exam 1705: Physical examination:  Nursing notes reviewed; Vital signs and O2 SAT reviewed;  Constitutional: Well developed, Well nourished, Well hydrated, In no acute distress. Disheveled, poor hygiene.; Head:  Normocephalic, atraumatic; Eyes: EOMI, PERRL, No scleral icterus; ENMT: Mouth and pharynx normal, Mucous membranes moist; Neck: Supple, Full range of motion, No lymphadenopathy; Cardiovascular: Regular rate and rhythm, No gallop; Respiratory: Breath sounds clear & equal bilaterally, No wheezes.  Speaking full sentences with ease, Normal respiratory effort/excursion; Chest: Nontender, Movement normal; Abdomen: Soft, Nontender, Nondistended, Normal bowel sounds; Genitourinary: No CVA tenderness. Genital exam performed with pt permission and ED Tech chaperone present during exam.  No perineal erythema.  No penile lesions or drainage.  No scrotal erythema, edema or tenderness to palp.  Normal testicular lie.  No testicular tenderness to palp.  +cremasteric reflexes bilat.  No inguinal LAN or palpable masses.; Extremities: Pulses normal, No tenderness, No edema, No calf edema or asymmetry.; Neuro: AA&Ox3, Major CN grossly intact. Speech mumbling and rambling. No gross focal motor or sensory deficits in extremities. Climbs on and off stretcher easily by himself. Gait steady..; Skin: Color normal, Warm, Dry.; Psych:  Affect flat, poor eye contact. Flight of ideas. Denies SI/HI.    ED Treatments / Results  Labs (all labs ordered are listed, but only abnormal results are displayed)   EKG  EKG Interpretation None       Radiology   Procedures Procedures (including critical care time)  Medications Ordered in ED Medications - No data to display   Initial Impression / Assessment and Plan / ED Course  I have reviewed the triage vital signs and the nursing notes.  Pertinent labs & imaging results that were available during my care of the patient were reviewed by me and considered in my medical  decision making (see chart for details).  MDM Reviewed: previous chart, nursing note and vitals Reviewed previous: labs Interpretation: labs and ultrasound    Results for orders placed or performed during the hospital encounter of 06/26/17  Urinalysis, Routine w reflex microscopic- may I&O cath if menses  Result Value Ref Range   Color, Urine YELLOW YELLOW   APPearance CLEAR CLEAR   Specific Gravity, Urine 1.020 1.005 - 1.030   pH 6.0 5.0 - 8.0   Glucose, UA NEGATIVE NEGATIVE mg/dL   Hgb urine dipstick NEGATIVE NEGATIVE   Bilirubin Urine NEGATIVE NEGATIVE   Ketones, ur NEGATIVE NEGATIVE mg/dL   Protein, ur NEGATIVE NEGATIVE mg/dL   Nitrite NEGATIVE NEGATIVE   Leukocytes, UA NEGATIVE NEGATIVE  Basic metabolic panel  Result Value Ref Range   Sodium 141 135 - 145 mmol/L   Potassium 4.1 3.5 - 5.1 mmol/L   Chloride 107 101 - 111 mmol/L   CO2 27 22 -  32 mmol/L   Glucose, Bld 79 65 - 99 mg/dL   BUN 13 6 - 20 mg/dL   Creatinine, Ser 1.610.98 0.61 - 1.24 mg/dL   Calcium 9.7 8.9 - 09.610.3 mg/dL   GFR calc non Af Amer >60 >60 mL/min   GFR calc Af Amer >60 >60 mL/min   Anion gap 7 5 - 15  Ethanol  Result Value Ref Range   Alcohol, Ethyl (B) <10 <10 mg/dL  CBC with Differential  Result Value Ref Range   WBC 6.3 4.0 - 10.5 K/uL   RBC 4.92 4.22 - 5.81 MIL/uL   Hemoglobin 14.1 13.0 - 17.0 g/dL   HCT 04.541.9 40.939.0 - 81.152.0 %   MCV 85.2 78.0 - 100.0 fL   MCH 28.7 26.0 - 34.0 pg   MCHC 33.7 30.0 - 36.0 g/dL   RDW 91.413.6 78.211.5 - 95.615.5 %   Platelets 189 150 - 400 K/uL   Neutrophils Relative % 62 %   Neutro Abs 3.9 1.7 - 7.7 K/uL   Lymphocytes Relative 28 %   Lymphs Abs 1.8 0.7 - 4.0 K/uL   Monocytes Relative 10 %   Monocytes Absolute 0.6 0.1 - 1.0 K/uL   Eosinophils Relative 0 %   Eosinophils Absolute 0.0 0.0 - 0.7 K/uL   Basophils Relative 0 %   Basophils Absolute 0.0 0.0 - 0.1 K/uL  Urine rapid drug screen (hosp performed)  Result Value Ref Range   Opiates NONE DETECTED NONE DETECTED    Cocaine NONE DETECTED NONE DETECTED   Benzodiazepines NONE DETECTED NONE DETECTED   Amphetamines NONE DETECTED NONE DETECTED   Tetrahydrocannabinol NONE DETECTED NONE DETECTED   Barbiturates NONE DETECTED NONE DETECTED    [place] Scrotum Result Date: 06/26/2017 CLINICAL DATA:  Right-sided scrotal pain. EXAM: SCROTAL ULTRASOUND DOPPLER ULTRASOUND OF THE TESTICLES TECHNIQUE: Complete ultrasound examination of the testicles, epididymis, and other scrotal structures was performed. Color and spectral Doppler ultrasound were also utilized to evaluate blood flow to the testicles. COMPARISON:  05/03/2017 FINDINGS: Right testicle Measurements: 4.5 x 1.7 x 2.3 cm. Symmetric and homogeneous echotexture without focal lesion. Patent arterial and venous blood flow. Left testicle Measurements: 4.3 x 1.9 x 2.2 cm. Symmetric and homogeneous echotexture without focal lesion. Patent arterial and venous blood flow. Right epididymis:  Normal in size and appearance. Left epididymis:  Normal in size and appearance. Hydrocele:  Small bilateral hydroceles Varicocele:  None visualized. Pulsed Doppler interrogation of both testes demonstrates normal low resistance arterial and venous waveforms bilaterally. IMPRESSION: Unremarkable scrotal ultrasound examination. Electronically Signed   By: Rudie MeyerP.  Gallerani M.D.   On: 06/26/2017 18:28     1900:  During genital exam, pt grabbed his penis and stated "what's that doing there" and "that's not mine." Concern regarding this statement, as well as pt's disheveled appearance, mumbling speech, disorganized thoughts and flight of ideas so TTS called for evaluation. After evaluation: pt meets inpt psychiatric criteria. Placement pending. Holding orders written.    Final Clinical Impressions(s) / ED Diagnoses   Final diagnoses:  Scrotal pain  Psychosis, unspecified psychosis type [facility](HCC)  Delusion [facility](HCC)    ED Discharge Orders    None        Samuel JesterMcManus, Florene Brill, DO 06/26/17 2224
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
## 2017-06-27 DIAGNOSIS — F209 Schizophrenia, unspecified: Secondary | ICD-10-CM

## 2017-06-27 DIAGNOSIS — Z87891 Personal history of nicotine dependence: Secondary | ICD-10-CM

## 2017-06-27 LAB — URINE CULTURE

## 2017-06-27 NOTE — ED Notes (Signed)
Report to include situation, background, assessment and recommendations from Edie RN. Patient sleeping, respirations regular and unlabored. Q15 minute rounds and security camera observation to continue.   

## 2017-06-27 NOTE — ED Notes (Signed)
Pt oriented to room and unit .  Pt is calm and cooperative.  Pt denies S/I, H/I, and AVH.  Pt was given a snack per request.  15 minute checks and video monitoring in place.

## 2017-06-27 NOTE — ED Notes (Signed)
Hourly rounding reveals patient in room. No complaints, stable, in no acute distress. Q15 minute rounds and monitoring via Security Cameras to continue. 

## 2017-06-27 NOTE — ED Notes (Signed)
Hourly rounding reveals patient sleeping in room. No complaints, stable, in no acute distress. Q15 minute rounds and monitoring via Security Cameras to continue. 

## 2017-06-27 NOTE — ED Notes (Signed)
Patient arrived to the unit and is calm and cooperative with care. Pt denies suicidal or homicidal ideations at this time. No distress noted. Patient given extra blankets and a soda upon request.

## 2017-06-27 NOTE — Progress Notes (Signed)
Per Psychiatrist Hisada and NP Parks, patient meets inpatient criteria. CSW contacted [name] and provided patient's information to AC, no beds available at this time. CSW contacted [facility], per TTS staff no beds available at this time. CSW faxed patient's referral to: [facility], [facility], [place], [place], [facility] and [facility]  Ciin Brazzel, LCSWA [facility]  Clinical Social Worker [phone]  

## 2017-06-27 NOTE — ED Notes (Signed)
Patient awake, alert and oriented, warm and dry, in no acute distress. Patient denies SI, HI, AVH and pain. Patient made aware of Q15 minute rounds and security cameras for their safety. Patient instructed to come to me with needs or concerns. 
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
## 2017-06-28 DIAGNOSIS — R440 Auditory hallucinations: Secondary | ICD-10-CM

## 2017-06-28 DIAGNOSIS — R4587 Impulsiveness: Secondary | ICD-10-CM

## 2017-06-28 DIAGNOSIS — F200 Paranoid schizophrenia: Secondary | ICD-10-CM

## 2017-06-28 DIAGNOSIS — R450 Nervousness: Secondary | ICD-10-CM

## 2017-06-28 DIAGNOSIS — F419 Anxiety disorder, unspecified: Secondary | ICD-10-CM

## 2017-06-28 MED ORDER — ARIPIPRAZOLE 5 MG PO TABS
15.0000 mg | ORAL_TABLET | Freq: Every day | ORAL | Status: DC
  Administered 2017-06-29: 10:00:00 15 mg via ORAL
  Filled 2017-06-28: qty 1

## 2017-06-28 NOTE — ED Notes (Signed)
Pt has been very withdrawn today.  He is compliant with meds.  He denies S/I, H/I, and AVH.  He has some bizarre ideas about his childhood and seems to be delusional thinking he has had multiple surgeries to reconstruct his face.  He is very softspoken.  15 minute checks and video monitoring continue.

## 2017-06-28 NOTE — ED Notes (Signed)
Hourly rounding reveals patient sleeping in room. No complaints, stable, in no acute distress. Q15 minute rounds and monitoring via Security Cameras to continue. 

## 2017-06-28 NOTE — Progress Notes (Signed)
Per Psychiatrist [place] and NP [facility], patient continues to meet inpatient criteria. CSW contacted the following facilities to follow up on patient's referral.  [facility] - staff reported that no assessment RN was present and requested that CSW call back later today.  [facility] - No answer  [facility] - staff member Gala Murdochanisha reported that patient was declined due to acuity.  [facility] - at capacity.  [facility] - No answer  CS[facility].   Celso SickleKimberly Liliauna Santoni, LCSWA [facility]  Clinical Social Worker [phone]

## 2017-06-28 NOTE — ED Notes (Signed)
Pt acting very bizarre.  He has feces on his bed and he went into bathroom staring in mirror trying to clean his bottom.  He seems very confused and has been there a long time.  We went to door and offered shower supplies. He continues to stand in front of mirror.
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
## 2017-06-28 NOTE — ED Notes (Signed)
Report to include Situation, Background, Assessment, and Recommendations received from Edie RN. Patient alert, warm and dry, in no acute distress. Patient denies SI, HI, AVH and pain. Patient made aware of Q15 minute rounds and security cameras for their safety. Patient instructed to come to me with needs or concerns. 

## 2017-06-29 ENCOUNTER — Inpatient Hospital Stay (HOSPITAL_COMMUNITY)
Admission: AD | Admit: 2017-06-29 | Discharge: 2017-07-14 | DRG: 885 | Disposition: A | Discharge status: Home / Self Care | Payer: Federal, State, Local not specified - Other | Source: Intra-hospital | Attending: Psychiatry | Admitting: Psychiatry

## 2017-06-29 ENCOUNTER — Other Ambulatory Visit: Payer: Self-pay

## 2017-06-29 ENCOUNTER — Encounter (HOSPITAL_COMMUNITY): Payer: Self-pay | Admitting: *Deleted

## 2017-06-29 DIAGNOSIS — F329 Major depressive disorder, single episode, unspecified: Secondary | ICD-10-CM | POA: Diagnosis present

## 2017-06-29 DIAGNOSIS — F649 Gender identity disorder, unspecified: Secondary | ICD-10-CM | POA: Diagnosis present

## 2017-06-29 DIAGNOSIS — F419 Anxiety disorder, unspecified: Secondary | ICD-10-CM | POA: Diagnosis not present

## 2017-06-29 DIAGNOSIS — F640 Transsexualism: Secondary | ICD-10-CM | POA: Diagnosis not present

## 2017-06-29 DIAGNOSIS — Z79899 Other long term (current) drug therapy: Secondary | ICD-10-CM | POA: Diagnosis not present

## 2017-06-29 DIAGNOSIS — F429 Obsessive-compulsive disorder, unspecified: Secondary | ICD-10-CM | POA: Diagnosis not present

## 2017-06-29 DIAGNOSIS — Z87891 Personal history of nicotine dependence: Secondary | ICD-10-CM

## 2017-06-29 DIAGNOSIS — G4700 Insomnia, unspecified: Secondary | ICD-10-CM | POA: Diagnosis present

## 2017-06-29 DIAGNOSIS — F4310 Post-traumatic stress disorder, unspecified: Secondary | ICD-10-CM | POA: Diagnosis not present

## 2017-06-29 DIAGNOSIS — F23 Brief psychotic disorder: Secondary | ICD-10-CM | POA: Diagnosis present

## 2017-06-29 DIAGNOSIS — F209 Schizophrenia, unspecified: Secondary | ICD-10-CM | POA: Diagnosis not present

## 2017-06-29 DIAGNOSIS — F200 Paranoid schizophrenia: Secondary | ICD-10-CM | POA: Diagnosis not present

## 2017-06-29 DIAGNOSIS — R450 Nervousness: Secondary | ICD-10-CM | POA: Diagnosis not present

## 2017-06-29 LAB — GC/CHLAMYDIA PROBE AMP (~~LOC~~) NOT AT ARMC
CHLAMYDIA, DNA PROBE: NEGATIVE
NEISSERIA GONORRHEA: NEGATIVE

## 2017-06-29 NOTE — ED Notes (Signed)
Hourly rounding reveals patient sleeping in room. No complaints, stable, in no acute distress. Q15 minute rounds and monitoring via Security Cameras to continue. 

## 2017-06-29 NOTE — ED Notes (Signed)
[facility] but refusing admission. Patient stated "I'll fill a lawsuit [id number]. I dont' need to go there". Patient talking to staff member complaining.

## 2017-06-29 NOTE — ED Notes (Signed)
[facility]. Patient is ambulatory ans in stable condition.

## 2017-06-29 NOTE — BH Assessment (Signed)
[name] Assessment Progress Note  Per Collin MinsMojeed Akintayo, MD, this pt requires psychiatric hospitalization.  Collin Heinrichina Tate, RN, [facility]  Pt presents under voluntary status, and does not agree to be admitted.  EDP Alvira MondayErin Schlossman, MD finds that pt meets criteria for IVC, which she has initiate.  ED secretary Misty StanleyLisa agrees [place]  Final disposition will be completed after Findings and Custody Order is served.  Pt's nurse, Diane, has been notified, and agrees to call report to [phone].  Pt is to be transported via [employer].   Collin Canninghomas Endora Teresi, MA Triage Specialist [phone]

## 2017-06-29 NOTE — ED Notes (Signed)
[name] Ready to accept patient. [employer] called for transportation. Awaits transport at this time.

## 2017-06-29 NOTE — Tx Team (Signed)
Initial Treatment Plan 06/29/2017 10:56 PM Collin White JYN:[id number]    PATIENT STRESSORS: Health problems Medication change or noncompliance   PATIENT STRENGTHS: Ability for insight Average or above average intelligence General fund of knowledge   PATIENT IDENTIFIED PROBLEMS: Psychosis "I don't have anything, I just want to be discharged"                     DISCHARGE CRITERIA:  Ability to meet basic life and health needs Improved stabilization in mood, thinking, and/or behavior Verbal commitment to aftercare and medication compliance  PRELIMINARY DISCHARGE PLAN: Attend aftercare/continuing care group Return to previous living arrangement  PATIENT/FAMILY INVOLVEMENT: This treatment plan has been presented to and reviewed with the patient, Collin White, and/or family member, .  The patient and family have been given the opportunity to ask questions and make suggestions.  Collin White, [place], [place] 06/29/2017, 10:56 PM

## 2017-06-29 NOTE — ED Provider Notes (Signed)
21 year old male who identifies as male who presented with concern for dysuria, was found to have paranoid ruminations, concern for psychosis.  Patient had been evaluated by psychiatry, and was felt appropriate for inpatient treatment.  Psychiatry providers had felt that she was appropriate for involuntary commitment given her psychosis per reports, however at this time patient wants to leave.  Given patient's desire to leave, and concern for prior statements, with concern for pyschosis and that she is a danger to herself.  Placed IVC to ensure she receives the inpatient treatment recommended at this time.    Alvira MondaySchlossman, Shantika Bermea, MD 06/29/17 [phone]

## 2017-06-29 NOTE — Progress Notes (Signed)
06/29/17 1409:  LRT went to pt room to offer activities, pt stated he would think about it.  Pt never came up for activities.   Caroll RancherMarjette Tahjai Schetter, LRT/CTRS
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
## 2017-06-29 NOTE — Progress Notes (Signed)
Collin White is a 21 year old male pt who identifies as a male who was admitted on involuntary basis. On admission, Collin White thought process is disorganized and tangential and reports that he does not know why he is here and spoke about how he just wants to be discharged. When asked about medication compliance, he goes on a tangent about how he is taking ibuprofen but would not state if he is taking his other medications as prescribed. Collin White denies any substance abuse issues and denies any SI and is able to contract for safety on the unit. Collin White reports that he is living with his cousin and reports that he will go back there after discharge. Collin White was oriented to the unit and safety maintained.

## 2017-06-30 DIAGNOSIS — F209 Schizophrenia, unspecified: Principal | ICD-10-CM

## 2017-06-30 DIAGNOSIS — F23 Brief psychotic disorder: Secondary | ICD-10-CM | POA: Diagnosis present

## 2017-06-30 DIAGNOSIS — F22 Delusional disorders: Secondary | ICD-10-CM

## 2017-06-30 DIAGNOSIS — F419 Anxiety disorder, unspecified: Secondary | ICD-10-CM

## 2017-06-30 DIAGNOSIS — Z818 Family history of other mental and behavioral disorders: Secondary | ICD-10-CM

## 2017-06-30 DIAGNOSIS — F429 Obsessive-compulsive disorder, unspecified: Secondary | ICD-10-CM

## 2017-06-30 DIAGNOSIS — F4310 Post-traumatic stress disorder, unspecified: Secondary | ICD-10-CM

## 2017-06-30 MED ORDER — IBUPROFEN 400 MG PO TABS
400.0000 mg | ORAL_TABLET | Freq: Four times a day (QID) | ORAL | Status: DC | PRN
  Administered 2017-07-04: 400 mg via ORAL
  Filled 2017-06-30: qty 1

## 2017-06-30 MED ORDER — LORAZEPAM 1 MG PO TABS: 1.0000 mg | ORAL_TABLET | ORAL | Status: DC | PRN

## 2017-06-30 MED ORDER — ALUM & MAG HYDROXIDE-SIMETH 200-200-20 MG/5ML PO SUSP: 30.0000 mL | ORAL | Status: DC | PRN

## 2017-06-30 MED ORDER — ZIPRASIDONE MESYLATE 20 MG IM SOLR: 20.0000 mg | INTRAMUSCULAR | Status: DC | PRN

## 2017-06-30 MED ORDER — ARIPIPRAZOLE 10 MG PO TABS
10.0000 mg | ORAL_TABLET | Freq: Once | ORAL | Status: AC
  Administered 2017-06-30: 10 mg via ORAL
  Filled 2017-06-30: qty 1

## 2017-06-30 MED ORDER — ACETAMINOPHEN 325 MG PO TABS
650.0000 mg | ORAL_TABLET | Freq: Four times a day (QID) | ORAL | Status: DC | PRN
  Administered 2017-06-30: 650 mg via ORAL
  Filled 2017-06-30: qty 2

## 2017-06-30 MED ORDER — ARIPIPRAZOLE 5 MG PO TABS
5.0000 mg | ORAL_TABLET | Freq: Every day | ORAL | Status: DC
  Administered 2017-06-30: 5 mg via ORAL
  Filled 2017-06-30 (×3): qty 1

## 2017-06-30 MED ORDER — ARIPIPRAZOLE 15 MG PO TABS
15.0000 mg | ORAL_TABLET | Freq: Every day | ORAL | Status: DC
  Administered 2017-07-01: 15 mg via ORAL
  Filled 2017-06-30 (×3): qty 1

## 2017-06-30 MED ORDER — HYDROXYZINE HCL 25 MG PO TABS
25.0000 mg | ORAL_TABLET | Freq: Four times a day (QID) | ORAL | Status: DC | PRN
  Administered 2017-06-30 – 2017-07-04 (×5): 25 mg via ORAL
  Filled 2017-06-30 (×2): qty 1
  Filled 2017-06-30: qty 10
  Filled 2017-06-30 (×3): qty 1
  Filled 2017-06-30: qty 10

## 2017-06-30 MED ORDER — OLANZAPINE 10 MG PO TBDP
10.0000 mg | ORAL_TABLET | Freq: Three times a day (TID) | ORAL | Status: DC | PRN
Start: 2017-06-30 — End: 2017-07-05

## 2017-06-30 MED ORDER — TRAZODONE HCL 50 MG PO TABS
50.0000 mg | ORAL_TABLET | Freq: Every evening | ORAL | Status: DC | PRN
  Administered 2017-06-30 – 2017-07-06 (×7): 50 mg via ORAL
  Filled 2017-06-30 (×34): qty 1

## 2017-06-30 MED ORDER — MAGNESIUM HYDROXIDE 400 MG/5ML PO SUSP: 30.0000 mL | Freq: Every day | ORAL | Status: DC | PRN
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
## 2017-06-30 NOTE — Progress Notes (Signed)
D: Pt denies SI, HI, AVH and pain this shift. Presents guarded, isolative with blunted affect. Argumentative and verbally abusive on interactions. Pt had stools on her bed, in under wear and on bathroom floor. Per pt "you dumb, dumb that's no stool". Unable to redirect pt at the time. Pt did not attend scheduled groups when prompted.   A: Q 15 minutes safety checks maintained on and off unit without outburst. Scheduled medications given as per order with verbal education and effects monitored. Infromed pt on medication update /changes made to treatment regimen.  Support and encouragement offered to pt throughout this shift.  R: Pt receptive to care. Verbalized understanding and was in agreement with medication changes made this shift. Tolerates all PO intake well. Remains safe on and off unit. Denies concerns at this time. POC continues for safety and mood stability.
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
## 2017-06-30 NOTE — B[facility] Group Notes (Signed)
LCSW Group Therapy Note  06/30/2017 1:15pm  Type of Therapy/Topic:  Group Therapy:  Feelings about Diagnosis  Participation Level:  Did Not Attend   Description of Group:   This group will allow patients to explore their thoughts and feelings about diagnoses they have received. Patients will be guided to explore their level of understanding and acceptance of these diagnoses. Facilitator will encourage patients to process their thoughts and feelings about the reactions of others to their diagnosis and will guide patients in identifying ways to discuss their diagnosis with significant others in their lives. This group will be process-oriented, with patients participating in exploration of their own experiences, giving and receiving support, and processing challenge from other group members.   Therapeutic Goals: 1. Patient will demonstrate understanding of diagnosis as evidenced by identifying two or more symptoms of the disorder 2. Patient will be able to express two feelings regarding the diagnosis 3. Patient will demonstrate their ability to communicate their needs through discussion and/or role play  Summary of Patient Progress:       Therapeutic Modalities:   Cognitive Behavioral Therapy Brief Therapy Feelings Identification    Collin [name], LCSW 06/30/2017 1:12 PM

## 2017-06-30 NOTE — Progress Notes (Signed)
Recreation Therapy Notes  Date: 06/30/17 Time: 1000 Location: 500 Hall Dayroom  Group Topic: Leisure Education  Goal Area(s) Addresses:  Patient will identify positive leisure activities.  Patient will identify one positive benefit of participation in leisure activities.   Intervention: Dry erase marker, dry erase board, eraser, container with words  Activity: Pictionary.  One patient would come up and pick a word out of the container.  Patient then draws a picture for the word they picked.  The rest of the group tries to guess what the picture is.  The person that guesses correctly, gets the next turn.  Education:  Leisure Education, Building control surveyorDischarge Planning  Education Outcome: Acknowledges education/In group clarification offered/Needs additional education  Clinical Observations/Feedback: Pt did not attend group.    Caroll RancherMarjette Tylee Newby, LRT/CTRS         Caroll RancherLindsay, Roxi Hlavaty A 06/30/2017 12:17 PM

## 2017-06-30 NOTE — Plan of Care (Signed)
Pt remains a low falls risk, denies SI at this time.  

## 2017-06-30 NOTE — [facility] Suicide Risk Assessment (Signed)
[facility]   Nursing information obtained from:    Demographic factors:    Current Mental Status:    Loss Factors:    Historical Factors:    Risk Reduction Factors:     Total Time spent with patient: 1 hour Principal Problem: Schizophrenia (HCC) Diagnosis:   Patient Active Problem List   Diagnosis Date Noted  . Schizophrenia (HCC) [F20.9] 06/30/2017  . Schizophrenia, paranoid type (HCC) [F20.0] 06/08/2017  . 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 Paranoid schizophrenia (HCC) [F20.0] 09/29/2015  . 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 History of posttraumatic stress disorder (PTSD) [Z86.59] 09/26/2015   Subjective Data:  See H&P for full HPI - Collin White is a 21 y/o transgender F with preferred name of "Collin White" and history of schizophrenia who was admitted on IVC with worsening symptoms of psychosis. Pt had initially presented to ED with somatic complaints related to pain in the perianal area, but those symptoms resolved; however, pt was concern of risk to herself due to disorganized psychosis and she was placed on IVC. [facility] and she has not been adherent to prescribed medications or follow up. She denies SI/HI/AH/VH today but she is disorganized, grandiose, and delusional. She agrees to be restarted on abilify.  Continued Clinical Symptoms:  Alcohol Use Disorder Identification Test Final Score (AUDIT): 0 The "Alcohol Use Disorders Identification Test", Guidelines for Use in Primary Care, Second Edition.  World Science writerHealth Organization [facility](WHO). Score between 0-7:  no or low risk or alcohol related problems. Score between 8-15:  moderate risk of alcohol related problems. Score between 16-19:  high risk of alcohol related problems. Score 20 or above:  warrants further diagnostic evaluation for alcohol dependence and treatment.   CLINICAL  FACTORS:   Severe Anxiety and/or Agitation Schizophrenia:   Paranoid or undifferentiated type Previous Psychiatric Diagnoses and Treatments Medical Diagnoses and Treatments/Surgeries   Musculoskeletal: Strength & Muscle Tone: within normal limits Gait & Station: normal Patient leans: N/A  Psychiatric Specialty Exam: Physical Exam  Nursing note and vitals reviewed.   Review of Systems  Constitutional: Negative for fever.  Respiratory: Negative for cough and shortness of breath.   Gastrointestinal: Negative for heartburn and nausea.  Psychiatric/Behavioral: Negative for depression, hallucinations and suicidal ideas.    Blood pressure 115/73, pulse 72, temperature 98.2 F (36.8 C), temperature source Oral, resp. rate (!) 82, height 6' (1.829 m), weight 63.5 kg (140 lb).Body mass index is 18.99 kg/m.  General Appearance: Disheveled  Eye Contact:  Good  Speech:  Clear and Coherent  Volume:  Normal  Mood:  Anxious and Euthymic  Affect:  Blunt, Congruent and Flat  Thought Process:  Disorganized and Descriptions of Associations: Loose  Orientation:  Full (Time, Place, and Person)  Thought Content:  Illogical, Delusions and Ideas of Reference:   Delusions  Suicidal Thoughts:  No  Homicidal Thoughts:  No  Memory:  Immediate;   Fair Recent;   Fair Remote;   Fair  Judgement:  Impaired  Insight:  Lacking  Psychomotor Activity:  Normal  Concentration:  Concentration: Good  Recall:  Good  Fund of Knowledge:  Fair  Language:  Fair  Akathisia:  No  Handed:    AIMS (if indicated):     Assets:  Communication Skills Leisure Time Physical Health  ADL's:  Intact  Cognition:  WNL  Sleep:  Number of Hours: 5.75  COGNITIVE FEATURES THAT CONTRIBUTE TO RISK:  Polarized thinking and Thought constriction (tunnel vision)    SUICIDE RISK:   Minimal: No identifiable suicidal ideation.  Patients presenting with no risk factors but with morbid ruminations; may be classified as  minimal risk based on the severity of the depressive symptoms  PLAN OF CARE:  - Admit to inpatient psychiatry unit - Schizophrenia  - Restart abilify 15mg  po qDay  - Start atarax 25mg  q6h prn anxiety  - Start trazodone 50mg  qhs prn insomnia - SW team to refer pt to ACT team when stable for discharge - Encourage participation in groups and the therapeutic milieu - Discharge planning will be ongoing   I certify that inpatient services furnished can reasonably be expected to improve the patient's condition.   Micheal Likenshristopher T Tina Gruner, MD 06/30/2017, 12:46 PM

## 2017-06-30 NOTE — B[facility] Suicide Risk Assessment (Signed)
[name] INPATIENT:  Family/Significant Other Suicide Prevention Education  Suicide Prevention Education:  Education Completed; No one has been identified by the patient as the family member/significant other with whom the patient will be residing, and identified as the person(s) who will aid the patient in the event of a mental health crisis (suicidal ideations/suicide attempt).  With written consent from the patient, the family member/significant other has been provided the following suicide prevention education, prior to the and/or following the discharge of the patient.  The suicide prevention education provided includes the following:  Suicide risk factors  Suicide prevention and interventions  National Suicide Hotline telephone number  [facility] assessment telephone number  [facility] Emergency Assistance 911  [facility] and/or Residential Mobile Crisis Unit telephone number  Request made of family/significant other to:  Remove weapons (e.g., guns, rifles, knives), all items previously/currently identified as safety concern.    Remove drugs/medications (over-the-counter, prescriptions, illicit drugs), all items previously/currently identified as a safety concern.  The family member/significant other verbalizes understanding of the suicide prevention education information provided.  The family member/significant other agrees to remove the items of safety concern listed above. The patient did not endorse SI at the time of admission, nor did the patient c/o SI during the stay here.  SPE not required.   Collin DaubRodney B [facility] 06/30/2017, 8:28 AM
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
## 2017-06-30 NOTE — Tx Team (Signed)
Interdisciplinary Treatment and Diagnostic Plan Update  06/30/2017 Time of Session: 9:14 AM  Collin White MRN: [id number]  Principal Diagnosis:  Schizophrenia  Secondary Diagnoses: Active Problems:   Schizophrenia ([place])   Current Medications:  Current Facility-Administered Medications  Medication Dose Route Frequency Provider Last Rate Last Dose  . acetaminophen (TYLENOL) tablet 650 mg  650 mg Oral Q6H PRN Laverle Hobby, PA-C      . alum & mag hydroxide-simeth (MAALOX/MYLANTA) 200-200-20 MG/5ML suspension 30 mL  30 mL Oral Q4H PRN Patriciaann Clan E, PA-C      . ARIPiprazole (ABILIFY) tablet 5 mg  5 mg Oral Daily Patriciaann Clan E, PA-C   5 mg at 06/30/17 0848  . hydrOXYzine (ATARAX/VISTARIL) tablet 25 mg  25 mg Oral Q6H PRN Laverle Hobby, PA-C      . ibuprofen (ADVIL,MOTRIN) tablet 400 mg  400 mg Oral Q6H PRN Patriciaann Clan E, PA-C      . OLANZapine zydis (ZYPREXA) disintegrating tablet 10 mg  10 mg Oral Q8H PRN Laverle Hobby, PA-C       And  . LORazepam (ATIVAN) tablet 1 mg  1 mg Oral PRN Patriciaann Clan E, PA-C       And  . ziprasidone (GEODON) injection 20 mg  20 mg Intramuscular PRN Patriciaann Clan E, PA-C      . magnesium hydroxide (MILK OF MAGNESIA) suspension 30 mL  30 mL Oral Daily PRN Laverle Hobby, PA-C      . traZODone (DESYREL) tablet 50 mg  50 mg Oral QHS,MR X 1 Simon, Collin E, PA-C        PTA Medications: Medications Prior to Admission  Medication Sig Dispense Refill Last Dose  . ARIPiprazole (ABILIFY) 10 MG tablet Take 1 tablet (10 mg total) by mouth daily. For mood control (Patient not taking: Reported on 06/20/2017) 30 tablet 0 Not Taking at Unknown time  . benztropine (COGENTIN) 1 MG tablet Take 1 tablet (1 mg total) by mouth at bedtime. For prevention of drug induced tremors (Patient not taking: Reported on 06/20/2017) 30 tablet 0 Not Taking at Unknown time  . ibuprofen (ADVIL,MOTRIN) 200 MG tablet Take 400 mg by mouth every 6 (six) hours as needed for  moderate pain.   Past Month at Unknown time  . ibuprofen (ADVIL,MOTRIN) 800 MG tablet Take 1 tablet (800 mg total) 3 (three) times daily by mouth. (Patient not taking: Reported on 06/25/2017) 21 tablet 0 Not Taking at Unknown time  . lidocaine (LIDODERM) 5 % Place 1 patch daily onto the skin. Remove & Discard patch within 12 hours or as directed by MD (Patient not taking: Reported on 06/25/2017) 30 patch 0 Not Taking at Unknown time  . methocarbamol (ROBAXIN) 500 MG tablet Take 1 tablet (500 mg total) 2 (two) times daily by mouth. (Patient not taking: Reported on 06/25/2017) 20 tablet 0 Not Taking at Unknown time  . naproxen (NAPROSYN) 500 MG tablet Take 1 tablet (500 mg total) 2 (two) times daily by mouth. (Patient not taking: Reported on 06/25/2017) 30 tablet 0 Not Taking at Unknown time  . ondansetron (ZOFRAN ODT) 4 MG disintegrating tablet Take 1 tablet (4 mg total) by mouth every 8 (eight) hours as needed for nausea or vomiting. (Patient not taking: Reported on 06/21/2017) 10 tablet 0 Not Taking at Unknown time  . Oxcarbazepine (TRILEPTAL) 300 MG tablet Take 1 tablet (300 mg total) by mouth 2 (two) times daily. For mood stabilization (Patient not taking: Reported on 06/20/2017) 60  tablet 0 Not Taking at Unknown time  . traZODone (DESYREL) 50 MG tablet Take 1 tablet (50 mg total) by mouth at bedtime. For sleep (Patient not taking: Reported on 06/20/2017) 30 tablet 0 Not Taking at Unknown time    Patient Stressors: Health problems Medication change or noncompliance  Patient Strengths: Ability for insight Average or above average intelligence General fund of knowledge  Treatment Modalities: Medication Management, Group therapy, Case management,  1 to 1 session with clinician, Psychoeducation, Recreational therapy.   Physician Treatment Plan for Primary Diagnosis: Schizophrenia Long Term Goal(s): Improvement in symptoms so as ready for discharge  Short Term Goals:    Medication Management:  Evaluate patient's response, side effects, and tolerance of medication regimen.  Therapeutic Interventions: 1 to 1 sessions, Unit Group sessions and Medication administration.  Evaluation of Outcomes: Progressing  Physician Treatment Plan for Secondary Diagnosis: Active Problems:   Schizophrenia ([place])   Long Term Goal(s): Improvement in symptoms so as ready for discharge  Short Term Goals:    Medication Management: Evaluate patient's response, side effects, and tolerance of medication regimen.  Therapeutic Interventions: 1 to 1 sessions, Unit Group sessions and Medication administration.  Evaluation of Outcomes: Progressing   RN Treatment Plan for Primary Diagnosis: Schizophrenia Long Term Goal(s): Knowledge of disease and therapeutic regimen to maintain health will improve  Short Term Goals: Ability to identify and develop effective coping behaviors will improve and Compliance with prescribed medications will improve  Medication Management: RN will administer medications as ordered by provider, will assess and evaluate patient's response and provide education to patient for prescribed medication. RN will report any adverse and/or side effects to prescribing provider.  Therapeutic Interventions: 1 on 1 counseling sessions, Psychoeducation, Medication administration, Evaluate responses to treatment, Monitor vital signs and CBGs as ordered, Perform/monitor CIWA, COWS, AIMS and Fall Risk screenings as ordered, Perform wound care treatments as ordered.  Evaluation of Outcomes: Progressing   LCSW Treatment Plan for Primary Diagnosis: Schizophrenia Long Term Goal(s): Safe transition to appropriate next level of care at discharge, Engage patient in therapeutic group addressing interpersonal concerns.  Short Term Goals: Engage patient in aftercare planning with referrals and resources  Therapeutic Interventions: Assess for all discharge needs, 1 to 1 time with Social worker, Explore  available resources and support systems, Assess for adequacy in community support network, Educate family and significant other(s) on suicide prevention, Complete Psychosocial Assessment, Interpersonal group therapy.  Evaluation of Outcomes: Met  Waylan Boga will return home at d/c, follow up at [facility] in Treatment: Attending groups: Yes Participating in groups: Yes Taking medication as prescribed: Yes Toleration medication: Yes, no side effects reported at this time Family/Significant other contact made: No Patient understands diagnosis: No Limited insight Discussing patient identified problems/goals with staff: Yes Medical problems stabilized or resolved: Yes Denies suicidal/homicidal ideation: Yes Issues/concerns per patient self-inventory: None Other: N/A  New problem(s) identified: None identified at this time.   New Short Term/Long Term Goal(s): "I'm not sick or anything; I caught a headache from sitting down."  Discharge Plan or Barriers:   Reason for Continuation of Hospitalization: Disorganization Medication stabilization   Estimated Length of Stay: 11/16  Attendees: Patient: Collin White 06/30/2017  9:14 AM  Physician: Maris Berger, MD 06/30/2017  9:14 AM  Nursing: Sena Hitch, RN 06/30/2017  9:14 AM  RN Care Manager: Lars Pinks, RN 06/30/2017  9:14 AM  Social Worker: Ripley Fraise 06/30/2017  9:14 AM  Recreational Therapist: Winfield Cunas 06/30/2017  9:14 AM  Other: Norberto Sorenson 06/30/2017  9:14 AM  Other:  06/30/2017  9:14 AM    Scribe for Treatment Team:  Roque Lias LCSW 06/30/2017 9:14 AM

## 2017-06-30 NOTE — Progress Notes (Signed)
  DATA ACTION RESPONSE  Objective- Pt. is visible in the dayroom, seen laying in bed with eyes open. Pt is currently mumbling to self.  Presents with a flat/depressed affect and mood.Minimal and cautious on approach. Pt was suspicious with bedtime meds.  Subjective- Denies having any SI/HI/AVH at this time.Rates pain 6/10; headache.Is cooperative and remain safe on the unit.  1:1 interaction in private to establish rapport. Encouragement, education, & support given from staff.  PRN Tylenol and vistrail requested and will re-eval accordingly.   Safety maintained with Q 15 checks. Continue with POC.

## 2017-07-01 MED ORDER — OLANZAPINE 10 MG PO TABS
10.0000 mg | ORAL_TABLET | Freq: Every day | ORAL | Status: DC
  Administered 2017-07-01: 10 mg via ORAL
  Filled 2017-07-01 (×3): qty 1

## 2017-07-01 NOTE — Progress Notes (Addendum)
  DATA ACTION RESPONSE  Objective- Pt. is visible in the room, seen laying in bed with eyes open.   Presents with a flat affect and mood.Isolative to milieu. Minimal and cautious on approach. First dose Zyprexa started at bedtime.  Subjective- Denies having any SI/HI/AVH/Pain at this time. Is cooperative and remain safe on the unit.  1:1 interaction in private to establish rapport. Encouragement, education, & support given from staff.  PRN vistrail requested and will re-eval accordingly.   Safety maintained with Q 15 checks. Continue with POC.

## 2017-07-01 NOTE — [facility] Group Notes (Signed)
LCSW Group Therapy Note   07/01/2017 1:15pm   Type of Therapy and Topic:  Group Therapy:  Trust and Honesty  Participation Level:  Did Not Attend  Description of Group:    In this group patients will be asked to explore the value of being honest.  Patients will be guided to discuss their thoughts, feelings, and behaviors related to honesty and trusting in others. Patients will process together how trust and honesty relate to forming relationships with peers, family members, and self. Each patient will be challenged to identify and express feelings of being vulnerable. Patients will discuss reasons why people are dishonest and identify alternative outcomes if one was truthful (to self or others). This group will be process-oriented, with patients participating in exploration of their own experiences, giving and receiving support, and processing challenge from other group members.   Therapeutic Goals: 1. Patient will identify why honesty is important to relationships and how honesty overall affects relationships.  2. Patient will identify a situation where they lied or were lied too and the  feelings, thought process, and behaviors surrounding the situation 3. Patient will identify the meaning of being vulnerable, how that feels, and how that correlates to being honest with self and others. 4. Patient will identify situations where they could have told the truth, but instead lied and explain reasons of dishonesty.   Summary of Patient Progress    Therapeutic Modalities:   Cognitive Behavioral Therapy Solution Focused Therapy Motivational Interviewing Brief Therapy  Collin RogueRodney B Dresden [name], LCSW 07/01/2017 1:49 PM

## 2017-07-01 NOTE — Progress Notes (Signed)
[facility] MD Progress Note  07/01/2017 3:06 PM Collin White  MRN:  [id number] Subjective:   Collin White is a 21 y/o transgender male with preferred name of "Collin White" and history of schizophrenia who was admitted under IVC with worsening symptoms of psychosis. Pt was restarted on abilify upon admission as that was her discharge medication, but she indicated she had not been taking that medication outside the hospital. Today upon evaluation, pt was met in her room which was malodorous due to pt having smeared feces around the room. Pt was asked about the behavior of spreading feces, and she replied, "It's supposed to smell potent - it's a tampon." Pt was asked to explain what she meant, and she had tangential, bizarre response that females do not defecate after they are 23 years old. Pt denies SI/HI/AH/VH. She is tolerating her medications without difficulty, but she requests to add zyprexa to her current regimen. Pt explains, "I want abilify to give me help with my moods but I want zyprexa to clean away the broken things." Discussed with patient that ideally we would hope to choose only one antipsychotic, and pt was in agreement to attempt trial of zyprexa. Discussed with patient that expectation on the unit is that feces only goes into the toilet and that pt will have to maintain adequate hygiene, and she verbalized good understanding. Pt had no further questions, comments, or concerns.  Principal Problem: Schizophrenia ([place]) Diagnosis:   Patient Active Problem List   Diagnosis Date Noted  . Schizophrenia ([place]) [F20.9] 06/30/2017  . Schizophrenia, paranoid type ([place]) [F20.0] 06/08/2017  . 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 Psychoses ([place]) [F29]   . Paranoid schizophrenia ([place]) [F20.0] 09/29/2015  . Cannabis use disorder, moderate, in early remission ([place]) [F12.21] 09/26/2015  . History of posttraumatic stress disorder (PTSD) [Z86.59] 09/26/2015   Total Time spent with  patient: 30 minutes  Past Psychiatric History: see H&P  Past Medical History:  Past Medical History:  Diagnosis Date  . Psychoses ([place])   . Schizophrenia ([place])   . Seizures ([place])     Past Surgical History:  Procedure Laterality Date  . abd surgery s/p traumatic event    . facial reconstructive surgery    . NO PAST SURGERIES  patient stated he had facial reconstruction surgery as a child   . 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 Hypertension Other   . Mental illness Neg Hx    Family Psychiatric  History: see H&P Social History:  Social History   Substance and Sexual Activity  Alcohol Use No     Social History   Substance and Sexual Activity  Drug Use No    Social History   Socioeconomic History  . Marital status: Single    Spouse name: None  . Number of children: None  . Years of education: None  . Highest education level: None  Social Needs  . Financial resource strain: None  . Food insecurity - worry: None  . Food insecurity - inability: None  . Transportation needs - medical: None  . Transportation needs - non-medical: None  Occupational History  . None  Tobacco Use  . Smoking status: Former Smoker    Types: Cigarettes  . Smokeless tobacco: Never Used  Substance and Sexual Activity  . Alcohol use: No  . Drug use: No  . Sexual activity: Yes    Birth control/protection: Condom  Other  Topics Concern  . None  Social History Narrative   ** Merged History Encounter **       ** Merged History Encounter **       Additional Social History:                         Sleep: Fair  Appetite:  Fair  Current Medications: Current Facility-Administered Medications  Medication Dose Route Frequency Provider Last Rate Last Dose  . acetaminophen (TYLENOL) tablet 650 mg  650 mg Oral Q6H PRN Laverle Hobby, PA-C   650 mg at 06/30/17 2105  . alum & mag hydroxide-simeth (MAALOX/MYLANTA)  200-200-20 MG/5ML suspension 30 mL  30 mL Oral Q4H PRN Patriciaann Clan E, PA-C      . ARIPiprazole (ABILIFY) tablet 15 mg  15 mg Oral Daily Pennelope Bracken, MD   15 mg at 07/01/17 0829  . hydrOXYzine (ATARAX/VISTARIL) tablet 25 mg  25 mg Oral Q6H PRN Laverle Hobby, PA-C   25 mg at 06/30/17 2105  . ibuprofen (ADVIL,MOTRIN) tablet 400 mg  400 mg Oral Q6H PRN Patriciaann Clan E, PA-C      . OLANZapine zydis (ZYPREXA) disintegrating tablet 10 mg  10 mg Oral Q8H PRN Laverle Hobby, PA-C       And  . LORazepam (ATIVAN) tablet 1 mg  1 mg Oral PRN Patriciaann Clan E, PA-C       And  . ziprasidone (GEODON) injection 20 mg  20 mg Intramuscular PRN Patriciaann Clan E, PA-C      . magnesium hydroxide (MILK OF MAGNESIA) suspension 30 mL  30 mL Oral Daily PRN Laverle Hobby, PA-C      . traZODone (DESYREL) tablet 50 mg  50 mg Oral QHS,MR X 1 Laverle Hobby, PA-C   50 mg at 06/30/17 2105    Lab Results: No results found for this or any previous visit (from the past 48 hour(s)).  Blood Alcohol level:  Lab Results  Component Value Date   ETH <10 06/26/2017   ETH <10 63/08/6008    Metabolic Disorder Labs: Lab Results  Component Value Date   HGBA1C 5.2 06/10/2017   MPG 103 06/10/2017   MPG 114 09/27/2015   Lab Results  Component Value Date   PROLACTIN 32.0 (H) 06/10/2017   PROLACTIN 32.6 (H) 09/27/2015   Lab Results  Component Value Date   CHOL 158 06/10/2017   TRIG 252 (H) 06/10/2017   HDL 47 06/10/2017   CHOLHDL 3.4 06/10/2017   VLDL 50 (H) 06/10/2017   LDLCALC 61 06/10/2017   LDLCALC 76 09/27/2015    Physical Findings: AIMS: Facial and Oral Movements Muscles of Facial Expression: None, normal Lips and Perioral Area: None, normal Jaw: None, normal Tongue: None, normal,Extremity Movements Upper (arms, wrists, hands, fingers): None, normal Lower (legs, knees, ankles, toes): None, normal, Trunk Movements Neck, shoulders, hips: None, normal, Overall Severity Severity of  abnormal movements (highest score from questions above): None, normal Incapacitation due to abnormal movements: None, normal Patient's awareness of abnormal movements (rate only patient's report): No Awareness, Dental Status Current problems with teeth and/or dentures?: No Does patient usually wear dentures?: No  CIWA:    COWS:     Musculoskeletal: Strength & Muscle Tone: within normal limits Gait & Station: normal Patient leans: N/A  Psychiatric Specialty Exam: Physical Exam  Nursing note and vitals reviewed.   Review of Systems  Constitutional: Negative for chills and fever.  Respiratory: Negative  for cough.   Cardiovascular: Negative for chest pain.  Gastrointestinal: Negative for heartburn and nausea.  Psychiatric/Behavioral: Negative for depression, hallucinations and suicidal ideas. The patient is not nervous/anxious.     Blood pressure 109/66, pulse 92, temperature 98.2 F (36.8 C), temperature source Oral, resp. rate 16, height 6' (1.829 m), weight 63.5 kg (140 lb).Body mass index is 18.99 kg/m.  General Appearance: Disheveled and malodorous  Eye Contact:  Fair  Speech:  Slow  Volume:  Decreased  Mood:  Anxious  Affect:  Congruent and Flat  Thought Process:  Disorganized and Descriptions of Associations: Tangential  Orientation:  Full (Time, Place, and Person)  Thought Content:  Illogical, Delusions and Ideas of Reference:   Delusions  Suicidal Thoughts:  No  Homicidal Thoughts:  No  Memory:  Immediate;   Fair Recent;   Fair Remote;   Fair  Judgement:  Impaired  Insight:  Lacking  Psychomotor Activity:  Normal  Concentration:  Concentration: Fair  Recall:  [employer] of Knowledge:  Fair  Language:  Fair  Akathisia:  No  Handed:    AIMS (if indicated):     Assets:  Physical Health Resilience Social Support  ADL's:  Intact  Cognition:  WNL  Sleep:  Number of Hours: 6     Treatment Plan Summary: Daily contact with patient to assess and evaluate  symptoms and progress in treatment and Medication management. Pt continues to have symptoms of psychosis including disorganized, bizarre behaviors. She requests to change from abilify to zyprexa. - Continue inpatient hospitalization - Schizophrenia  - Discontinue abilify  - Start Zyprexa 43m qhs   - Continue atarax 220mq6h prn anxiety  - Continue trazodone 5086mhs prn insomnia - Encourage proper hygiene and adherence to unit expectations - Encourage participation in groups and the therapeutic milieu - Discharge planning will be ongoing  ChrPennelope BrackenD 07/01/2017, 3:06 PM

## 2017-07-01 NOTE — Progress Notes (Signed)
Recreation Therapy Notes  Date: 07/01/17 Time: 1000 Location: 500 Hall Dayroom  Group Topic: Wellness  Goal Area(s) Addresses:  Patient will define components of whole wellness. Patient will verbalize benefit of whole wellness.  Intervention:  2 Decks of playing cards, list of questions   Activity: Deck of Chance 2.0.  Patients were given 3 cards each from one deck.  LRT would draw a card from the other deck.  If a patient's number is drawn, they have the option of either doing an exercise, answering a question or doing both.      Education: Wellness, Building control surveyorDischarge Planning.   Education Outcome: Acknowledges education/In group clarification offered/Needs additional education.   Clinical Observations/Feedback: Pt did not attend group.   Caroll RancherMarjette Leiann Sporer, LRT/CTRS         Caroll RancherLindsay, Tanecia Mccay A 07/01/2017 12:27 PM

## 2017-07-01 NOTE — Progress Notes (Signed)
Patient has been isolative to room this shift.  It was noted that patient continues to smear feces in her room.  Patient was noted to be talking to unseen other.  Patient has had no incident of behavioral dyscontrol.   Assess patient for safety, offer medications as prescribed, engage patient in 1:1 staff talks.   Continue to monitor on a 1:1 basis.  Offer medications as prescribed.

## 2017-07-01 NOTE — Plan of Care (Signed)
Pt is taking meds as prescribed.  

## 2017-07-02 MED ORDER — OLANZAPINE 7.5 MG PO TABS
15.0000 mg | ORAL_TABLET | Freq: Every day | ORAL | Status: DC
  Administered 2017-07-02 – 2017-07-04 (×3): 15 mg via ORAL
  Filled 2017-07-02 (×5): qty 2

## 2017-07-02 NOTE — Progress Notes (Signed)
D: Pt presents with blunted affect and irritable mood on interactions. Denies SI, HI, AVH and pain. Rates her depression, hopelessness and anxiety all 0/10 on self inventory sheet. Reports she's sleeping well with good appetite. Goal for today is "getting out of here".  Remains preoccupied, suspicious and labile. Delusional as well, still believes she's on her period (stool being menses). Pt's room was cleaned, she showered and changed scrubs twice this shift with staff's assistance.  A: All medications given as per MD's orders with verbal education and effects monitored. Emotional support and availability provided to pt. Encouraged pt to voice concerns and comply with treatment regimen including groups. Q 15 minutes safety checks remains effective without self harm gestures.  R: Compliant with medications as ordered. Denies adverse drug reactions. Pt did not attend scheduled groups this shift despite multiple prompts / encouragement. Tolerates all PO intake well. Pt remains safe on and off unit.

## 2017-07-02 NOTE — Progress Notes (Signed)
[name] MD Progress Note  07/02/2017 12:24 PM Collin White  MRN:  [id number] Subjective:   Collin White is a 21 y/o transgender male with preferred name of "[name]" with history of schizophrenia who was admitted with worsening symptoms of psychosis. Pt initially presented with disorganized behavior, somatic delusions, and grandiose delusions. She had not been adherent to her outpatient medication regimen after last discharge from [facility] on 06/17/17. She was transitioned to zyprexa yesterday after she requested to add that medication to her regimen. Today upon evaluation, pt reports that she is doing "good." She is sleeping well and her appetite is good. RN staff reports that pt was able to shower and have room cleaned so that there are no longer any feces or odor remaining. Pt remains delusional that she has been having her period despite that she is genetically male. She also has delusions of grandeur including that she is "going on a cruise this week." Pt explains, "It's a advertisement cruise - a 98 million dollar cruise, but because it's the lotto - they don't tell you where they going." Pt denies SI/HI/AH/VH. She is tolerating her medications without difficulty. We discussed increasing the dose of zyprexa tonight and pt was in agreement. She had no further questions, comments, or concerns.  Principal Problem: Schizophrenia ([facility]) Diagnosis:   Patient Active Problem List   Diagnosis Date Noted  . Schizophrenia ([facility]) [F20.9] 06/30/2017  . Schizophrenia, paranoid type ([facility]) [F20.0] 06/08/2017  . 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 Paranoid schizophrenia ([facility]) [F20.0] 09/29/2015  . 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 History of posttraumatic stress disorder (PTSD) [Z86.59] 09/26/2015   Total Time spent with patient: 30 minutes  Past Psychiatric History: see H&P  Past Medical History:  Past Medical  History:  Diagnosis Date  . Psychoses ([facility])   . Schizophrenia ([facility])   . Seizures ([facility])     Past Surgical History:  Procedure Laterality Date  . abd surgery s/p traumatic event    . facial reconstructive surgery    . NO PAST SURGERIES  patient stated he had facial reconstruction surgery as a child   . 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 Hypertension Other   . Mental illness Neg Hx    Family Psychiatric  History: see H&P Social History:  Social History   Substance and Sexual Activity  Alcohol Use No     Social History   Substance and Sexual Activity  Drug Use No    Social History   Socioeconomic History  . Marital status: Single    Spouse name: None  . Number of children: None  . Years of education: None  . Highest education level: None  Social Needs  . Financial resource strain: None  . Food insecurity - worry: None  . Food insecurity - inability: None  . Transportation needs - medical: None  . Transportation needs - non-medical: None  Occupational History  . None  Tobacco Use  . Smoking status: Former Smoker    Types: Cigarettes  . Smokeless tobacco: Never Used  Substance and Sexual Activity  . Alcohol use: No  . Drug use: No  . Sexual activity: Yes    Birth control/protection: Condom  Other Topics Concern  . None  Social History Narrative   ** Merged History Encounter **       **  Merged History Encounter **       Additional Social History:                         Sleep: Good  Appetite:  Good  Current Medications: Current Facility-Administered Medications  Medication Dose Route Frequency Provider Last Rate Last Dose  . acetaminophen (TYLENOL) tablet 650 mg  650 mg Oral Q6H PRN Kerry HoughSimon, Spencer E, PA-C   650 mg at 06/30/17 2105  . alum & mag hydroxide-simeth (MAALOX/MYLANTA) 200-200-20 MG/5ML suspension 30 mL  30 mL Oral Q4H PRN Donell SievertSimon, Spencer E, PA-C      . hydrOXYzine  (ATARAX/VISTARIL) tablet 25 mg  25 mg Oral Q6H PRN Kerry HoughSimon, Spencer E, PA-C   25 mg at 07/01/17 2128  . ibuprofen (ADVIL,MOTRIN) tablet 400 mg  400 mg Oral Q6H PRN Donell SievertSimon, Spencer E, PA-C      . OLANZapine zydis (ZYPREXA) disintegrating tablet 10 mg  10 mg Oral Q8H PRN Kerry HoughSimon, Spencer E, PA-C       And  . LORazepam (ATIVAN) tablet 1 mg  1 mg Oral PRN Donell SievertSimon, Spencer E, PA-C       And  . ziprasidone (GEODON) injection 20 mg  20 mg Intramuscular PRN Donell SievertSimon, Spencer E, PA-C      . magnesium hydroxide (MILK OF MAGNESIA) suspension 30 mL  30 mL Oral Daily PRN Donell SievertSimon, Spencer E, PA-C      . OLANZapine (ZYPREXA) tablet 10 mg  10 mg Oral QHS Micheal Likensain[place], Calem Cocozza T, MD   10 mg at 07/01/17 2128  . traZODone (DESYREL) tablet 50 mg  50 mg Oral QHS,MR X 1 Kerry HoughSimon, Spencer E, PA-C   50 mg at 07/01/17 2128    Lab Results: No results found for this or any previous visit (from the past 48 hour(s)).  Blood Alcohol level:  Lab Results  Component Value Date   ETH <10 06/26/2017   ETH <10 06/18/2017    Metabolic Disorder Labs: Lab Results  Component Value Date   HGBA1C 5.2 06/10/2017   MPG 103 06/10/2017   MPG 114 09/27/2015   Lab Results  Component Value Date   PROLACTIN 32.0 (H) 06/10/2017   PROLACTIN 32.6 (H) 09/27/2015   Lab Results  Component Value Date   CHOL 158 06/10/2017   TRIG 252 (H) 06/10/2017   HDL 47 06/10/2017   CHOLHDL 3.4 06/10/2017   VLDL 50 (H) 06/10/2017   LDLCALC 61 06/10/2017   LDLCALC 76 09/27/2015    Physical Findings: AIMS: Facial and Oral Movements Muscles of Facial Expression: None, normal Lips and Perioral Area: None, normal Jaw: None, normal Tongue: None, normal,Extremity Movements Upper (arms, wrists, hands, fingers): None, normal Lower (legs, knees, ankles, toes): None, normal, Trunk Movements Neck, shoulders, hips: None, normal, Overall Severity Severity of abnormal movements (highest score from questions above): None, normal Incapacitation due to abnormal  movements: None, normal Patient's awareness of abnormal movements (rate only patient's report): No Awareness, Dental Status Current problems with teeth and/or dentures?: No Does patient usually wear dentures?: No  CIWA:    COWS:     Musculoskeletal: Strength & Muscle Tone: within normal limits Gait & Station: normal Patient leans: N/A  Psychiatric Specialty Exam: Physical Exam  Nursing note and vitals reviewed. Psychiatric: He has a normal mood and affect.    Review of Systems  Constitutional: Negative for chills and fever.  Respiratory: Negative for cough.   Cardiovascular: Negative for chest pain.  Gastrointestinal: Negative for heartburn  and nausea.  Psychiatric/Behavioral: Negative for depression, hallucinations and suicidal ideas. The patient is not nervous/anxious.     Blood pressure 102/68, pulse 85, temperature 97.6 F (36.4 C), temperature source Oral, resp. rate 18, height 6' (1.829 m), weight 63.5 kg (140 lb).Body mass index is 18.99 kg/m.  General Appearance: Casual and Fairly Groomed  Eye Contact:  Good  Speech:  Clear and Coherent and Normal Rate  Volume:  Normal  Mood:  Euthymic  Affect:  Congruent and Flat  Thought Process:  Disorganized and Descriptions of Associations: Loose  Orientation:  Full (Time, Place, and Person)  Thought Content:  Illogical and Delusions  Suicidal Thoughts:  No  Homicidal Thoughts:  No  Memory:  Immediate;   Good Recent;   Good Remote;   Good  Judgement:  Fair  Insight:  Lacking  Psychomotor Activity:  Normal  Concentration:  Concentration: Fair  Recall:  [employer]  Fund of Knowledge:  Fair  Language:  Fair  Akathisia:  No  Handed:    AIMS (if indicated):     Assets:  Desire for Improvement Physical Health Resilience  ADL's:  Intact  Cognition:  WNL  Sleep:  Number of Hours: 6.75     Treatment Plan Summary: Daily contact with patient to assess and evaluate symptoms and progress in treatment and Medication management.  Pt remains delusional and disorganized, but she is tolerating transition to zyprexa without difficulty or side effects, and we will plan to increase the dose tonight. - Continue inpatient hospitalization - Schizophrenia             - Change Zyprexa 10mg  qhs to zyprexa 15mg  qhs             - Continue atarax 25mg  q6h prn anxiety             - Continue trazodone 50mg  qhs prn insomnia - Encourage proper hygiene and adherence to unit expectations - Encourage participation in groups and the therapeutic milieu - Discharge planning will be ongoing  Micheal Likenshristopher T Jamese Trauger, MD 07/02/2017, 12:24 PM

## 2017-07-02 NOTE — Tx Team (Signed)
Interdisciplinary Treatment and Diagnostic Plan Update  07/02/2017 Time of Session: 5:51 PM  Collin White MRN: [id number]  Principal Diagnosis:  Schizophrenia  Secondary Diagnoses: Principal Problem:   Schizophrenia ([place])   Current Medications:  Current Facility-Administered Medications  Medication Dose Route Frequency Provider Last Rate Last Dose  . acetaminophen (TYLENOL) tablet 650 mg  650 mg Oral Q6H PRN Laverle Hobby, PA-C   650 mg at 06/30/17 2105  . alum & mag hydroxide-simeth (MAALOX/MYLANTA) 200-200-20 MG/5ML suspension 30 mL  30 mL Oral Q4H PRN Patriciaann Clan E, PA-C      . hydrOXYzine (ATARAX/VISTARIL) tablet 25 mg  25 mg Oral Q6H PRN Laverle Hobby, PA-C   25 mg at 07/01/17 2128  . ibuprofen (ADVIL,MOTRIN) tablet 400 mg  400 mg Oral Q6H PRN Patriciaann Clan E, PA-C      . OLANZapine zydis (ZYPREXA) disintegrating tablet 10 mg  10 mg Oral Q8H PRN Laverle Hobby, PA-C       And  . LORazepam (ATIVAN) tablet 1 mg  1 mg Oral PRN Patriciaann Clan E, PA-C       And  . ziprasidone (GEODON) injection 20 mg  20 mg Intramuscular PRN Patriciaann Clan E, PA-C      . magnesium hydroxide (MILK OF MAGNESIA) suspension 30 mL  30 mL Oral Daily PRN Patriciaann Clan E, PA-C      . OLANZapine (ZYPREXA) tablet 15 mg  15 mg Oral QHS Pennelope Bracken, MD      . traZODone (DESYREL) tablet 50 mg  50 mg Oral QHS,MR X 1 Laverle Hobby, PA-C   50 mg at 07/01/17 2128    PTA Medications: Medications Prior to Admission  Medication Sig Dispense Refill Last Dose  . ARIPiprazole (ABILIFY) 10 MG tablet Take 1 tablet (10 mg total) by mouth daily. For mood control (Patient not taking: Reported on 06/20/2017) 30 tablet 0 Not Taking at Unknown time  . benztropine (COGENTIN) 1 MG tablet Take 1 tablet (1 mg total) by mouth at bedtime. For prevention of drug induced tremors (Patient not taking: Reported on 06/20/2017) 30 tablet 0 Not Taking at Unknown time  . ibuprofen (ADVIL,MOTRIN) 200 MG tablet Take  400 mg by mouth every 6 (six) hours as needed for moderate pain.   Past Month at Unknown time  . ibuprofen (ADVIL,MOTRIN) 800 MG tablet Take 1 tablet (800 mg total) 3 (three) times daily by mouth. (Patient not taking: Reported on 06/25/2017) 21 tablet 0 Not Taking at Unknown time  . lidocaine (LIDODERM) 5 % Place 1 patch daily onto the skin. Remove & Discard patch within 12 hours or as directed by MD (Patient not taking: Reported on 06/25/2017) 30 patch 0 Not Taking at Unknown time  . methocarbamol (ROBAXIN) 500 MG tablet Take 1 tablet (500 mg total) 2 (two) times daily by mouth. (Patient not taking: Reported on 06/25/2017) 20 tablet 0 Not Taking at Unknown time  . naproxen (NAPROSYN) 500 MG tablet Take 1 tablet (500 mg total) 2 (two) times daily by mouth. (Patient not taking: Reported on 06/25/2017) 30 tablet 0 Not Taking at Unknown time  . ondansetron (ZOFRAN ODT) 4 MG disintegrating tablet Take 1 tablet (4 mg total) by mouth every 8 (eight) hours as needed for nausea or vomiting. (Patient not taking: Reported on 06/21/2017) 10 tablet 0 Not Taking at Unknown time  . Oxcarbazepine (TRILEPTAL) 300 MG tablet Take 1 tablet (300 mg total) by mouth 2 (two) times daily. For mood stabilization (Patient  not taking: Reported on 06/20/2017) 60 tablet 0 Not Taking at Unknown time  . traZODone (DESYREL) 50 MG tablet Take 1 tablet (50 mg total) by mouth at bedtime. For sleep (Patient not taking: Reported on 06/20/2017) 30 tablet 0 Not Taking at Unknown time    Patient Stressors: Health problems Medication change or noncompliance  Patient Strengths: Ability for insight Average or above average intelligence General fund of knowledge  Treatment Modalities: Medication Management, Group therapy, Case management,  1 to 1 session with clinician, Psychoeducation, Recreational therapy.   Physician Treatment Plan for Primary Diagnosis: Schizophrenia Long Term Goal(s): Improvement in symptoms so as ready for  discharge  Short Term Goals: Compliance with prescribed medications will improve Ability to identify changes in lifestyle to reduce recurrence of condition will improve Ability to demonstrate self-control will improve Ability to identify and develop effective coping behaviors will improve  Medication Management: Evaluate patient's response, side effects, and tolerance of medication regimen.  Therapeutic Interventions: 1 to 1 sessions, Unit Group sessions and Medication administration.  Evaluation of Outcomes: Progressing  Physician Treatment Plan for Secondary Diagnosis: Principal Problem:   Schizophrenia ([place])   Long Term Goal(s): Improvement in symptoms so as ready for discharge  Short Term Goals: Compliance with prescribed medications will improve Ability to identify changes in lifestyle to reduce recurrence of condition will improve Ability to demonstrate self-control will improve Ability to identify and develop effective coping behaviors will improve  Medication Management: Evaluate patient's response, side effects, and tolerance of medication regimen.  Therapeutic Interventions: 1 to 1 sessions, Unit Group sessions and Medication administration.  Evaluation of Outcomes: Progressing   11/15: Pt remains delusional and disorganized, but she is tolerating transition to zyprexa without difficulty or side effects, and we will plan to increase the dose tonight. - Continue inpatient hospitalization - Schizophrenia - Change Zyprexa '10mg'$  qhs to zyprexa '15mg'$  qhs - Continue atarax '25mg'$  q6h prn anxiety - Continue trazodone '50mg'$  qhs prn insomnia - Encourage proper hygiene and adherence to unit expectations     RN Treatment Plan for Primary Diagnosis: Schizophrenia Long Term Goal(s): Knowledge of disease and therapeutic regimen to maintain health will improve  Short Term Goals: Ability to identify and develop effective coping behaviors will  improve and Compliance with prescribed medications will improve  Medication Management: RN will administer medications as ordered by provider, will assess and evaluate patient's response and provide education to patient for prescribed medication. RN will report any adverse and/or side effects to prescribing provider.  Therapeutic Interventions: 1 on 1 counseling sessions, Psychoeducation, Medication administration, Evaluate responses to treatment, Monitor vital signs and CBGs as ordered, Perform/monitor CIWA, COWS, AIMS and Fall Risk screenings as ordered, Perform wound care treatments as ordered.  Evaluation of Outcomes: Progressing   LCSW Treatment Plan for Primary Diagnosis: Schizophrenia Long Term Goal(s): Safe transition to appropriate next level of care at discharge, Engage patient in therapeutic group addressing interpersonal concerns.  Short Term Goals: Engage patient in aftercare planning with referrals and resources  Therapeutic Interventions: Assess for all discharge needs, 1 to 1 time with Social worker, Explore available resources and support systems, Assess for adequacy in community support network, Educate family and significant other(s) on suicide prevention, Complete Psychosocial Assessment, Interpersonal group therapy.  Evaluation of Outcomes: Met  Collin White will return home at d/c, follow up at [facility] in Treatment: Attending groups: No Participating in groups: No Taking medication as prescribed: Yes Toleration medication: Yes, no side effects reported at this time Family/Significant  other contact made: No Patient understands diagnosis: No Limited insight Discussing patient identified problems/goals with staff: Yes Medical problems stabilized or resolved: Yes Denies suicidal/homicidal ideation: Yes Issues/concerns per patient self-inventory: None Other: N/A  New problem(s) identified: None identified at this time.   New Short Term/Long Term Goal(s): "I'm  not sick or anything; I caught a headache from sitting down."  Discharge Plan or Barriers:   Reason for Continuation of Hospitalization: Disorganization Medication stabilization   Estimated Length of Stay: 11/20  Attendees: Patient:  07/02/2017  5:51 PM  Physician: Maris Berger, MD 07/02/2017  5:51 PM  Nursing: Sena Hitch, RN 07/02/2017  5:51 PM  RN Care Manager: Lars Pinks, RN 07/02/2017  5:51 PM  Social Worker: Ripley Fraise 07/02/2017  5:51 PM  Recreational Therapist: Winfield Cunas 07/02/2017  5:51 PM  Other: Norberto Sorenson 07/02/2017  5:51 PM  Other:  07/02/2017  5:51 PM    Scribe for Treatment Team:  Roque Lias LCSW 07/02/2017 5:51 PM

## 2017-07-02 NOTE — Progress Notes (Signed)
Recreation Therapy Notes  Date:  07/02/17  Time: 1000 Location: 500 Hall Dayroom  Group Topic: Stress Management  Goal Area(s) Addresses:  Patient will verbalize importance of using healthy stress management.  Patient will identify positive emotions associated with healthy stress management.   Intervention: Stress Management  Activity :  Stress Management.  LRT introduced 3 stress management techniques (deep breathing, progressive muscle relaxation and guided imagery).  LRT walked patients through each technique to help them learn and understand the ways each technique can help reduce stress.  Education:  Stress Management, Discharge Planning.   Education Outcome: Acknowledges edcuation/In group clarification offered/Needs additional education  Clinical Observations/Feedback: Pt did not attend group.    Caroll RancherMarjette Jahir Halt, LRT/CTRS         Caroll RancherLindsay, Jenavieve Freda A 07/02/2017 12:43 PM

## 2017-07-02 NOTE — Progress Notes (Signed)
DATA ACTION RESPONSE  Objective-Pt. is visible in the room, seenlaying in bed with eyes open.  Presents with aflataffect and mood.Isolative to milieu.Minimal and cautious on approach. Less irritable; Pt took a shower today.  Subjective-Denies having any SI/HI/AVH/Pain at this time. Is cooperative and remain safe on the unit.  1:1 interaction in private to establish rapport. Encouragement, education, &support given from staff. PRN vistrailrequested and will re-eval accordingly.  Safety maintained with Q 15 checks. Continue with POC.

## 2017-07-02 NOTE — Progress Notes (Signed)
Did not attend group 

## 2017-07-02 NOTE — [facility] Group Notes (Signed)
LCSW Group Therapy Note   07/02/2017 1:15pm   Type of Therapy and Topic:  Group Therapy:  Positive Affirmations   Participation Level:  Did Not Attend  Description of Group: This group addressed positive affirmation toward self and others. Patients went around the room and identified two positive things about themselves and two positive things about a peer in the room. Patients reflected on how it felt to share something positive with others, to identify positive things about themselves, and to hear positive things from others. Patients were encouraged to have a daily reflection of positive characteristics or circumstances.  Therapeutic Goals 1. Patient will verbalize two of their positive qualities 2. Patient will demonstrate empathy for others by stating two positive qualities about a peer in the group 3. Patient will verbalize their feelings when voicing positive self affirmations and when voicing positive affirmations of others 4. Patients will discuss the potential positive impact on their wellness/recovery of focusing on positive traits of self and others. Summary of Patient Progress:    Therapeutic Modalities Cognitive Behavioral Therapy Motivational Interviewing  Collin White, Student-Social Work 07/02/2017 1:18 PM

## 2017-07-03 NOTE — Progress Notes (Signed)
Recreation Therapy Notes  Date: 07/03/17 Time: 1000 Location: 500 Hall Dayroom   Group Topic: Communication, Team Building, Problem Solving  Goal Area(s) Addresses:  Patient will effectively work with peer towards shared goal.  Patient will identify skills used to make activity successful.  Patient will identify how skills used during activity can be used to reach post d/c goals.   Intervention: STEM Activity  Activity: Stage managerLanding Pad. In teams patients were given 12 plastic drinking straws and a length of masking tape. Using the materials provided patients were asked to build a landing pad to catch a golf ball dropped from approximately 6 feet in the air.   Education: Pharmacist, communityocial Skills, Discharge Planning   Education Outcome: Acknowledges education/In group clarification offered/Needs additional education.   Clinical Observations/Feedback: Pt did not attend group.     Caroll RancherMarjette Patryce Depriest, LRT/CTRS         Caroll RancherLindsay, Annis Lagoy A 07/03/2017 11:36 AM

## 2017-07-03 NOTE — Progress Notes (Signed)
D:  Collin White has been in her much of the day.  She has minimal interaction with staff and no interaction peers.  She is irritable when asked if she is doing ok.  She has been noted masturbating multiple times in the room and has had multiple showers today. She hasn't been attending groups even after much encouragement.  She remains bizarre but denies SI/HI or A/V hallucinations.  She did not complete her self inventory after much encouragement.   A:  Offered 1:1 for support and encouragement.  Medications as ordered.  Q 15 minute checks maintained for safety.  Encouraged participation in group and unit activities. R:  Collin White remains safe on the unit.  We will continue to encourage group participation.  We will continue to monitor the progress towards her goals.

## 2017-07-03 NOTE — [facility] Group Notes (Signed)
[name] LCSW Group Therapy  07/03/2017  1:05 PM  Type of Therapy:  Group therapy  Participation Level:  Active  Participation Quality:  Attentive  Affect:  Flat  Cognitive:  Oriented  Insight:  Limited  Engagement in Therapy:  Limited  Modes of Intervention:  Discussion, Socialization  Summary of Progress/Problems:  Chaplain was here to lead a group on themes of hope and courage. Invited.  Chose to not attend.  Collin White, Collin White 07/03/2017 1:37 PM

## 2017-07-03 NOTE — Plan of Care (Signed)
Pt safe on the unit 

## 2017-07-03 NOTE — Progress Notes (Signed)
[facility]  07/03/2017 1:02 PM Collin White  MRN:  [id number] Subjective:   Collin White is a 21 y/o transgender F with preferred name of "Collin White" and hx of schizophrenia who was admitted with worsening symptoms of psychosis. Pt was restarted on medications and transitioned to zyprexa. Today upon evaluation, pt reports she is doing well overall. She denies SI/HI/AH/VH, but she remains grandiose and delusional that she has several financial resources outside of the hospital. Pt's disorganized behaviors involving feces and toilet paper appear to have improved, as her room is clean and not malodorous today. She is sleeping well and her appetite is good. Pt reports she is tolerating her medications without difficulty or side effects. Pt requests for a "muscle relaxer" today and explains that her "neck is open" and points to the back of her neck where there is no obvious lesion/injury. Pt denies any pain but explains that it feels tense. We discussed that pt may be feeling tension related to anxiety, and she could try vistaril as needed to see if that helps. Pt was also educated about EPS and to report to RN staff if muscle stiffness, rigidity, tremor, or restlessness develops. Pt agrees to continue her current treatment plan without changes at this time. She had no further questions, comments, or concerns.  Principal Problem: Schizophrenia ([facility]) Diagnosis:   Patient Active Problem List   Diagnosis Date Noted  . Schizophrenia (HCC) [F20.9] 06/30/2017  . Schizophrenia, paranoid type ([facility]CC) [F20.0] 06/08/2017  . 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 Paranoid schizophrenia (HCC) [F20.0] 09/29/2015  . 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 History of posttraumatic stress disorder (PTSD) [Z86.59] 09/26/2015   Total Time spent with patient: 30 minutes  Past Psychiatric History: see H&P  Past Medical  History:  Past Medical History:  Diagnosis Date  . Psychoses (HCC)   . Schizophrenia ([facility])   . Seizures (HCC)     Past Surgical History:  Procedure Laterality Date  . abd surgery s/p traumatic event    . facial reconstructive surgery    . NO PAST SURGERIES  patient stated he had facial reconstruction surgery as a child   . 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 Hypertension Other   . Mental illness Neg Hx    Family Psychiatric  History: see H&P Social History:  Social History   Substance and Sexual Activity  Alcohol Use No     Social History   Substance and Sexual Activity  Drug Use No    Social History   Socioeconomic History  . Marital status: Single    Spouse name: None  . Number of children: None  . Years of education: None  . Highest education level: None  Social Needs  . Financial resource strain: None  . Food insecurity - worry: None  . Food insecurity - inability: None  . Transportation needs - medical: None  . Transportation needs - non-medical: None  Occupational History  . None  Tobacco Use  . Smoking status: Former Smoker    Types: Cigarettes  . Smokeless tobacco: Never Used  Substance and Sexual Activity  . Alcohol use: No  . Drug use: No  . Sexual activity: Yes    Birth control/protection: Condom  Other Topics Concern  . None  Social History Narrative   ** Merged History Encounter **       **  Merged History Encounter **       Additional Social History:                         Sleep: Good  Appetite:  Good  Current Medications: Current Facility-Administered Medications  Medication Dose Route Frequency Provider Last Rate Last Dose  . acetaminophen (TYLENOL) tablet 650 mg  650 mg Oral Q6H PRN Kerry HoughSimon, Spencer E, PA-C   650 mg at 06/30/17 2105  . alum & mag hydroxide-simeth (MAALOX/MYLANTA) 200-200-20 MG/5ML suspension 30 mL  30 mL Oral Q4H PRN Donell SievertSimon, Spencer E, PA-C       . hydrOXYzine (ATARAX/VISTARIL) tablet 25 mg  25 mg Oral Q6H PRN Kerry HoughSimon, Spencer E, PA-C   25 mg at 07/02/17 2129  . ibuprofen (ADVIL,MOTRIN) tablet 400 mg  400 mg Oral Q6H PRN Donell SievertSimon, Spencer E, PA-C      . OLANZapine zydis (ZYPREXA) disintegrating tablet 10 mg  10 mg Oral Q8H PRN Kerry HoughSimon, Spencer E, PA-C       And  . LORazepam (ATIVAN) tablet 1 mg  1 mg Oral PRN Donell SievertSimon, Spencer E, PA-C       And  . ziprasidone (GEODON) injection 20 mg  20 mg Intramuscular PRN Donell SievertSimon, Spencer E, PA-C      . magnesium hydroxide (MILK OF MAGNESIA) suspension 30 mL  30 mL Oral Daily PRN Donell SievertSimon, Spencer E, PA-C      . OLANZapine (ZYPREXA) tablet 15 mg  15 mg Oral QHS Micheal Likensainville, Winda Summerall T, MD   15 mg at 07/02/17 2129  . traZODone (DESYREL) tablet 50 mg  50 mg Oral QHS,MR X 1 Kerry HoughSimon, Spencer E, PA-C   50 mg at 07/02/17 2129    Lab Results: No results found for this or any previous visit (from the past 48 hour(s)).  Blood Alcohol level:  Lab Results  Component Value Date   ETH <10 06/26/2017   ETH <10 06/18/2017    Metabolic Disorder Labs: Lab Results  Component Value Date   HGBA1C 5.2 06/10/2017   MPG 103 06/10/2017   MPG 114 09/27/2015   Lab Results  Component Value Date   PROLACTIN 32.0 (H) 06/10/2017   PROLACTIN 32.6 (H) 09/27/2015   Lab Results  Component Value Date   CHOL 158 06/10/2017   TRIG 252 (H) 06/10/2017   HDL 47 06/10/2017   CHOLHDL 3.4 06/10/2017   VLDL 50 (H) 06/10/2017   LDLCALC 61 06/10/2017   LDLCALC 76 09/27/2015    Physical Findings: AIMS: Facial and Oral Movements Muscles of Facial Expression: None, normal Lips and Perioral Area: None, normal Jaw: None, normal Tongue: None, normal,Extremity Movements Upper (arms, wrists, hands, fingers): None, normal Lower (legs, knees, ankles, toes): None, normal, Trunk Movements Neck, shoulders, hips: None, normal, Overall Severity Severity of abnormal movements (highest score from questions above): None, normal Incapacitation  due to abnormal movements: None, normal Patient's awareness of abnormal movements (rate only patient's report): No Awareness, Dental Status Current problems with teeth and/or dentures?: No Does patient usually wear dentures?: No  CIWA:    COWS:     Musculoskeletal: Strength & Muscle Tone: within normal limits Gait & Station: normal Patient leans: N/A  Psychiatric Specialty Exam: Physical Exam  Nursing note and vitals reviewed.   Review of Systems  Constitutional: Negative for chills and fever.  Respiratory: Negative for cough and shortness of breath.   Cardiovascular: Negative for chest pain.  Gastrointestinal: Negative for abdominal pain, heartburn, nausea and vomiting.  Psychiatric/Behavioral: Negative for depression and suicidal ideas.    Blood pressure 125/77, pulse 88, temperature (!) 97.3 F (36.3 C), temperature source Oral, resp. rate 18, height 6' (1.829 m), weight 63.5 kg (140 lb).Body mass index is 18.99 kg/m.  General Appearance: Casual and Fairly Groomed  Eye Contact:  Good  Speech:  Slurred  Volume:  Decreased  Mood:  Anxious  Affect:  Congruent and Flat  Thought Process:  Disorganized and Descriptions of Associations: Loose  Orientation:  Full (Time, Place, and Person)  Thought Content:  Delusions  Suicidal Thoughts:  No  Homicidal Thoughts:  No  Memory:  Immediate;   Good Recent;   Good Remote;   Good  Judgement:  Impaired  Insight:  Lacking  Psychomotor Activity:  Normal  Concentration:  Concentration: Good  Recall:  Fair  Fund of Knowledge:  Good  Language:  Fair  Akathisia:  No  Handed:    AIMS (if indicated):     Assets:  Communication Skills Leisure Time Physical Health Resilience  ADL's:  Intact  Cognition:  WNL  Sleep:  Number of Hours: 6.75     Treatment Plan Summary: Daily contact with patient to assess and evaluate symptoms and progress in treatment and Medication management. Pt has improvement of some disorganized behaviors but  she remains delusional with loose associations. She is tolerating zyprexa without difficulty or side effects, and we will continue her current treatment regimen without changes. - Continue inpatient hospitalization - Schizophrenia - Continue zyprexa 15mg  qhs - Continue atarax 25mg  q6h prn anxiety - Continue trazodone 50mg  qhs prn insomnia  - Encourage participation in groups and the therapeutic milieu - Discharge planning will be ongoing    Micheal Likens, MD 07/03/2017, 1:02 PM

## 2017-07-03 NOTE — Progress Notes (Signed)
D: Pt denies SI/HI/AVH . Pt is pleasant and cooperative. Pt has minimal interaction on the unit. Pt keeps to self .   A: Pt was offered support and encouragement. Pt was given scheduled medications. Pt was encourage to attend groups. Q 15 minute checks were done for safety.   R: safety maintained on unit.

## 2017-07-04 DIAGNOSIS — G4700 Insomnia, unspecified: Secondary | ICD-10-CM

## 2017-07-04 DIAGNOSIS — Z87891 Personal history of nicotine dependence: Secondary | ICD-10-CM

## 2017-07-04 NOTE — [facility] Group Notes (Signed)
LCSW Group Therapy Note  Date/Time:  07/04/2017   11:15AM-12:00PM  Type of Therapy and Topic:  Group Therapy:  Fears and Unhealthy/Healthy Coping Skills  Participation Level:  None   Description of Group:  The focus of this group was to discuss some of the prevalent fears that patients experience, and to identify the commonalities among group members.  An exercise was used to initiate the discussion, followed by writing on the white board a group-generated list of unhealthy coping and healthy coping techniques to deal with each fear.    Therapeutic Goals: 1. Patient will be able to distinguish between healthy and unhealthy coping skills 2. Patient will identify and describe 3 fears they experience 3. Patient will identify one positive coping strategy for each fear they experience 4. Patient will respond empathetically to peers' statements regarding fears they experience  Summary of Patient Progress:  The patient expressed at the beginning of group that he is famous and "an angel child" then started threatening another patient.  He was asked to stop and immediately left group.  Therapeutic Modalities Cognitive Behavioral Therapy Motivational Interviewing  [name], LCSW

## 2017-07-04 NOTE — Progress Notes (Signed)
[name] MD Progress Note  07/04/2017 10:20 AM Collin White  MRN:  [id number] Subjective: currently states " I am OK". Denies feeling depressed.Denies suicidal ideations. Denies medication side effects. Reports he is hoping to discharge soon. Objective : I have discussed case with RN staff and have met with patient.  21 y/o transgender male to male, identifies as  "Collin White" . History of  schizophrenia ,admitted with worsening symptoms of psychosis, somatic preoccupations and grandiose ideations .  Currently on Zyprexa. At this time patient denies depression, denies suicidal ideations, and is focused on being discharged soon. Presents vaguely guarded, suspicious, but calm and polite . No psychomotor restlessness or pressured speech, but remains grandiose - for example  states plans of going to any one of multiple houses she owns after discharge, reports having multiple children.    Principal Problem: Schizophrenia (HCC) Diagnosis:   Patient Active Problem List   Diagnosis Date Noted  . Schizophrenia (HCC) [F20.9] 06/30/2017  . Schizophrenia, paranoid type (HCC) [F20.0] 06/08/2017  . 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 Paranoid schizophrenia (HCC) [F20.0] 09/29/2015  . 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 History of posttraumatic stress disorder (PTSD) [Z86.59] 09/26/2015   Total Time spent with patient: 20 minutes  Past Psychiatric History: see H&P  Past Medical History:  Past Medical History:  Diagnosis Date  . Psychoses (HCC)   . Schizophrenia (HCC)   . Seizures (HCC)     Past Surgical History:  Procedure Laterality Date  . abd surgery s/p traumatic event    . facial reconstructive surgery    . NO PAST SURGERIES  patient stated he had facial reconstruction surgery as a child   . 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  Hypertension Other   . Mental illness Neg Hx    Family Psychiatric  History: see H&P Social History:  Social History   Substance and Sexual Activity  Alcohol Use No     Social History   Substance and Sexual Activity  Drug Use No    Social History   Socioeconomic History  . Marital status: Single    Spouse name: None  . Number of children: None  . Years of education: None  . Highest education level: None  Social Needs  . Financial resource strain: None  . Food insecurity - worry: None  . Food insecurity - inability: None  . Transportation needs - medical: None  . Transportation needs - non-medical: None  Occupational History  . None  Tobacco Use  . Smoking status: Former Smoker    Types: Cigarettes  . Smokeless tobacco: Never Used  Substance and Sexual Activity  . Alcohol use: No  . Drug use: No  . Sexual activity: Yes    Birth control/protection: Condom  Other Topics Concern  . None  Social History Narrative   ** Merged History Encounter **       ** Merged History Encounter **       Additional Social History:   Sleep: Good  Appetite:  Good  Current Medications: Current Facility-Administered Medications  Medication Dose Route Frequency Provider Last Rate Last Dose  . acetaminophen (TYLENOL) tablet 650 mg  650 mg Oral Q6H PRN Simon, Spencer E, PA-C   650 mg at 06/30/17 2105  . alum & mag hydroxide-simeth (MAALOX/MYLANTA) 200-200-20 MG/5ML suspension 30 mL  30 mL   Oral Q4H PRN Simon, Spencer E, PA-C      . hydrOXYzine (ATARAX/VISTARIL) tablet 25 mg  25 mg Oral Q6H PRN Simon, Spencer E, PA-C   25 mg at 07/03/17 2153  . ibuprofen (ADVIL,MOTRIN) tablet 400 mg  400 mg Oral Q6H PRN Simon, Spencer E, PA-C      . OLANZapine zydis (ZYPREXA) disintegrating tablet 10 mg  10 mg Oral Q8H PRN Simon, Spencer E, PA-C       And  . LORazepam (ATIVAN) tablet 1 mg  1 mg Oral PRN Simon, Spencer E, PA-C       And  . ziprasidone (GEODON) injection 20 mg  20 mg Intramuscular PRN  Simon, Spencer E, PA-C      . magnesium hydroxide (MILK OF MAGNESIA) suspension 30 mL  30 mL Oral Daily PRN Simon, Spencer E, PA-C      . OLANZapine (ZYPREXA) tablet 15 mg  15 mg Oral QHS [place], [name] T, MD   15 mg at 07/03/17 2153  . traZODone (DESYREL) tablet 50 mg  50 mg Oral QHS,MR X 1 Simon, Spencer E, PA-C   50 mg at 07/03/17 2153    Lab Results: No results found for this or any previous visit (from the past 48 hour(s)).  Blood Alcohol level:  Lab Results  Component Value Date   ETH <10 06/26/2017   ETH <10 06/18/2017    Metabolic Disorder Labs: Lab Results  Component Value Date   HGBA1C 5.2 06/10/2017   MPG 103 06/10/2017   MPG 114 09/27/2015   Lab Results  Component Value Date   PROLACTIN 32.0 (H) 06/10/2017   PROLACTIN 32.6 (H) 09/27/2015   Lab Results  Component Value Date   CHOL 158 06/10/2017   TRIG 252 (H) 06/10/2017   HDL 47 06/10/2017   CHOLHDL 3.4 06/10/2017   VLDL 50 (H) 06/10/2017   LDLCALC 61 06/10/2017   LDLCALC 76 09/27/2015    Physical Findings: AIMS: Facial and Oral Movements Muscles of Facial Expression: None, normal Lips and Perioral Area: None, normal Jaw: None, normal Tongue: None, normal,Extremity Movements Upper (arms, wrists, hands, fingers): None, normal Lower (legs, knees, ankles, toes): None, normal, Trunk Movements Neck, shoulders, hips: None, normal, Overall Severity Severity of abnormal movements (highest score from questions above): None, normal Incapacitation due to abnormal movements: None, normal Patient's awareness of abnormal movements (rate only patient's report): No Awareness, Dental Status Current problems with teeth and/or dentures?: No Does patient usually wear dentures?: No  CIWA:    COWS:     Musculoskeletal: Strength & Muscle Tone: within normal limits Gait & Station: normal Patient leans: N/A  Psychiatric Specialty Exam: Physical Exam  Nursing note and vitals reviewed.   Review of Systems   Constitutional: Negative for chills and fever.  Respiratory: Negative for cough and shortness of breath.   Cardiovascular: Negative for chest pain.  Gastrointestinal: Negative for abdominal pain, heartburn, nausea and vomiting.  Psychiatric/Behavioral: Negative for depression and suicidal ideas.  denies chest pain, no shortness of breath   Blood pressure (!) 125/93, pulse (!) 120, temperature 98.7 F (37.1 C), resp. rate 16, height 6' (1.829 m), weight 63.5 kg (140 lb).Body mass index is 18.99 kg/m.  General Appearance: Fairly Groomed  Eye Contact:  Good  Speech:  Normal Rate  Volume:  soft  Mood:  vaguely anxious and irritable   Affect:  blunted   Thought Process: appears to be improving , but remains tangential, disorganized   Orientation:  Other:  fully   alert and attentive  Thought Content:  denies hallucinatons, not internally preoccupied at this time, grandiose ideations  Suicidal Thoughts:  No denies suicidal or self injurious ideations   Homicidal Thoughts:  No  Memory:  Immediate;   Good Recent;   Good Remote;   Good  Judgement:  Fair  Insight:  limited   Psychomotor Activity:  Normal- no psychomotor agitation at this time   Concentration:  Concentration: Fair and Attention Span: Fair  Recall:  Fair  Fund of Knowledge:  Good  Language:  Fair  Akathisia:  No  Handed:    AIMS (if indicated):     Assets:  Communication Skills Leisure Time Physical Health Resilience  ADL's:  Intact  Cognition:  WNL  Sleep:  Number of Hours: 6.75   Assessment - patient denies depression, denies hallucinations, denies suicidal ideations and generally minimizes significant psychiatric symptoms at this time. Remains thought disordered, tangential, and expressing grandiose ideations. Thus far tolerating Zyprexa trial well . No akathisia noted .  Treatment Plan Summary: Daily contact with patient to assess and evaluate symptoms and progress in treatment and Medication management.   Treatment Plan reviewed as below today 11/17  - Continue inpatient hospitalization - Schizophrenia - Continue zyprexa 15mg qhs - Continue atarax 25mg q6h prn anxiety - Continue trazodone 50mg qhs prn insomnia  - Encourage participation in groups and the therapeutic milieu - Discharge planning will be ongoing    Fernando A Cobos, MD 07/04/2017, 10:20 AM   Patient ID: Marisol Denz, male   DOB: 07/10/1994, 22 y.o.   MRN: [id number]  

## 2017-07-04 NOTE — Plan of Care (Signed)
Pt isolates to room most of the evening only coming out for medications, pt sleeping over 6 hrs last night, pt continues to identify as male and appears to be responding to internal stimuli

## 2017-07-04 NOTE — Progress Notes (Signed)
Did not attend group 

## 2017-07-04 NOTE — Progress Notes (Signed)
Patient ID: Collin White, male   DOB: 07/10/1994, 22 y.o.   MRN: [id number]    D:Pt has been very isolative on the unit today, he remained in his room most of the day. Pt did not engage in any treatment, he did not attend any groups. Pt refused to respond to staff unless he was called "Keyla" and use pronouns "she, her, hers". Pt believes in his mind that he is a male. Pt reported that his depression was a 0, his hopelessness was a 0, and his anxiety was a 0. Pt reported that his goal was nothing.Pt reported being negative SI/HI, no AH/VH noted. A: 15 min checks continued for patient safety. R: Pt safety maintained.    

## 2017-07-04 NOTE — Progress Notes (Signed)
D: Pt denies SI/HI/AVH. Pt is pleasant and cooperative when she comes out of the room. Pt continues to isolate to room and has minimal interaction with staff / peers A: Pt was offered support and encouragement. Pt was given scheduled medications. Pt was encourage to attend groups. Q 15 minute checks were done for safety.  R: safety maintained on unit.

## 2017-07-05 DIAGNOSIS — R450 Nervousness: Secondary | ICD-10-CM

## 2017-07-05 DIAGNOSIS — F200 Paranoid schizophrenia: Secondary | ICD-10-CM

## 2017-07-05 MED ORDER — LORAZEPAM 1 MG PO TABS: 1.0000 mg | ORAL_TABLET | ORAL | Status: DC | PRN

## 2017-07-05 MED ORDER — ZIPRASIDONE MESYLATE 20 MG IM SOLR: 20.0000 mg | INTRAMUSCULAR | Status: DC | PRN

## 2017-07-05 MED ORDER — OLANZAPINE 5 MG PO TBDP: 5.0000 mg | ORAL_TABLET | Freq: Three times a day (TID) | ORAL | Status: DC | PRN

## 2017-07-05 MED ORDER — OLANZAPINE 10 MG PO TABS
20.0000 mg | ORAL_TABLET | Freq: Every day | ORAL | Status: DC
  Administered 2017-07-05 – 2017-07-10 (×6): 20 mg via ORAL
  Filled 2017-07-05 (×8): qty 2

## 2017-07-05 MED ORDER — CITALOPRAM HYDROBROMIDE 10 MG PO TABS
5.0000 mg | ORAL_TABLET | Freq: Every day | ORAL | Status: DC
  Administered 2017-07-06 – 2017-07-09 (×4): 5 mg via ORAL
  Filled 2017-07-05 (×5): qty 1

## 2017-07-05 NOTE — Progress Notes (Signed)
[facility]  07/05/2017 1:23 PM Collin White  MRN:  [id number] Subjective:  Patient states " I am fine.'  Objective: Patient seen and chart reviewed.  Discussed with treatment team as well as RN. Patient was initially seen as partially naked in his room.  Patient also appears to be malodorous, disheveled. Patient continues to present as disorganized, delusional. Patient with poor insight, does not think she needs to be on medications or in the hospital. Patient is transgender - male to male, wants to be addressed as' she, her'.  She gets upset if not. [name] per RN has been noted as stripping in room on and off , isolative and has outbursts on and off . Continues to need medication changes .     Principal Problem: Schizophrenia (HCC) Diagnosis:   Patient Active Problem List   Diagnosis Date Noted  . Schizophrenia (HCC) [F20.9] 06/30/2017  . Schizophrenia, paranoid type (HCC) [F20.0] 06/08/2017  . 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 Paranoid schizophrenia (HCC) [F20.0] 09/29/2015  . 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 History of posttraumatic stress disorder (PTSD) [Z86.59] 09/26/2015   Total Time spent with patient: 25 minutes  Past Psychiatric History: Please see H&P.   Past Medical History:  Past Medical History:  Diagnosis Date  . Psychoses (HCC)   . Schizophrenia (HCC)   . Seizures (HCC)     Past Surgical History:  Procedure Laterality Date  . abd surgery s/p traumatic event    . facial reconstructive surgery    . NO PAST SURGERIES  patient stated he had facial reconstruction surgery as a child   . 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 Hypertension Other   . Mental illness Neg Hx    Family Psychiatric  History: Please see H&P.  Social History: Please see H&P.  Social History   Substance and  Sexual Activity  Alcohol Use No     Social History   Substance and Sexual Activity  Drug Use No    Social History   Socioeconomic History  . Marital status: Single    Spouse name: None  . Number of children: None  . Years of education: None  . Highest education level: None  Social Needs  . Financial resource strain: None  . Food insecurity - worry: None  . Food insecurity - inability: None  . Transportation needs - medical: None  . Transportation needs - non-medical: None  Occupational History  . None  Tobacco Use  . Smoking status: Former Smoker    Types: Cigarettes  . Smokeless tobacco: Never Used  Substance and Sexual Activity  . Alcohol use: No  . Drug use: No  . Sexual activity: Yes    Birth control/protection: Condom  Other Topics Concern  . None  Social History Narrative   ** Merged History Encounter **       ** Merged History Encounter **       Additional Social History:                         Sleep: Fair  Appetite:  improving  Current Medications: Current Facility-Administered Medications  Medication Dose Route Frequency Provider Last Rate Last Dose  . acetaminophen (TYLENOL) tablet 650 mg  650 mg Oral Q6H PRN Kerry HoughSimon, Spencer E, PA-C   650  mg at 06/30/17 2105  . alum & mag hydroxide-simeth (MAALOX/MYLANTA) 200-200-20 MG/5ML suspension 30 mL  30 mL Oral Q4H PRN Donell SievertSimon, Spencer E, PA-C      . hydrOXYzine (ATARAX/VISTARIL) tablet 25 mg  25 mg Oral Q6H PRN Kerry HoughSimon, Spencer E, PA-C   25 mg at 07/04/17 2132  . ibuprofen (ADVIL,MOTRIN) tablet 400 mg  400 mg Oral Q6H PRN Kerry HoughSimon, Spencer E, PA-C   400 mg at 07/04/17 1527  . OLANZapine zydis (ZYPREXA) disintegrating tablet 10 mg  10 mg Oral Q8H PRN Kerry HoughSimon, Spencer E, PA-C       And  . LORazepam (ATIVAN) tablet 1 mg  1 mg Oral PRN Donell SievertSimon, Spencer E, PA-C       And  . ziprasidone (GEODON) injection 20 mg  20 mg Intramuscular PRN Donell SievertSimon, Spencer E, PA-C      . magnesium hydroxide (MILK OF MAGNESIA)  suspension 30 mL  30 mL Oral Daily PRN Donell SievertSimon, Spencer E, PA-C      . OLANZapine (ZYPREXA) tablet 15 mg  15 mg Oral QHS Micheal [place], [name] T, MD   15 mg at 07/04/17 2131  . traZODone (DESYREL) tablet 50 mg  50 mg Oral QHS,MR X 1 Kerry HoughSimon, Spencer E, PA-C   50 mg at 07/04/17 2131    Lab Results: No results found for this or any previous visit (from the past 48 hour(s)).  Blood Alcohol level:  Lab Results  Component Value Date   ETH <10 06/26/2017   ETH <10 06/18/2017    Metabolic Disorder Labs: Lab Results  Component Value Date   HGBA1C 5.2 06/10/2017   MPG 103 06/10/2017   MPG 114 09/27/2015   Lab Results  Component Value Date   PROLACTIN 32.0 (H) 06/10/2017   PROLACTIN 32.6 (H) 09/27/2015   Lab Results  Component Value Date   CHOL 158 06/10/2017   TRIG 252 (H) 06/10/2017   HDL 47 06/10/2017   CHOLHDL 3.4 06/10/2017   VLDL 50 (H) 06/10/2017   LDLCALC 61 06/10/2017   LDLCALC 76 09/27/2015    Physical Findings: AIMS: Facial and Oral Movements Muscles of Facial Expression: None, normal Lips and Perioral Area: None, normal Jaw: None, normal Tongue: None, normal,Extremity Movements Upper (arms, wrists, hands, fingers): None, normal Lower (legs, knees, ankles, toes): None, normal, Trunk Movements Neck, shoulders, hips: None, normal, Overall Severity Severity of abnormal movements (highest score from questions above): None, normal Incapacitation due to abnormal movements: None, normal Patient's awareness of abnormal movements (rate only patient's report): No Awareness, Dental Status Current problems with teeth and/or dentures?: No Does patient usually wear dentures?: No  CIWA:    COWS:     Musculoskeletal: Strength & Muscle Tone: within normal limits Gait & Station: normal Patient leans: N/A  Psychiatric Specialty Exam: Physical Exam  Nursing note and vitals reviewed.   Review of Systems  Psychiatric/Behavioral: The patient is nervous/anxious.   All other  systems reviewed and are negative.   Blood pressure 108/85, pulse 92, temperature 97.8 F (36.6 C), temperature source Oral, resp. rate 18, height 6' (1.829 m), weight 63.5 kg (140 lb).Body mass index is 18.99 kg/m.  General Appearance: Disheveled  Eye Contact:  Poor  Speech:  Slow  Volume:  Decreased  Mood:  Anxious, Dysphoric and Irritable  Affect:  Constricted  Thought Process:  Irrelevant and Descriptions of Associations: Circumstantial  Orientation:  Full (Time, Place, and Person)  Thought Content:  Delusions, Paranoid Ideation and Rumination  Suicidal Thoughts:  No  Homicidal  Thoughts:  No  Memory:  Immediate;   Fair Recent;   Poor Remote;   Poor  Judgement:  Impaired  Insight:  Shallow  Psychomotor Activity:  Restlessness  Concentration:  Concentration: Fair and Attention Span: Fair  Recall:  [employer] of Knowledge:  Fair  Language:  Fair  Akathisia:  No  Handed:  Right  AIMS (if indicated):     Assets:  Desire for Improvement Others:  access to healthcare  ADL's:  Impaired  Cognition:  WNL  Sleep:  Number of Hours: 6.75     Treatment Plan Summary: Pt is a 21 year old male to male transgender, presented as disorganized, delusional with mood lability.  Patient continues to have outburst, noted as disheveled and malodorous, stripping in her room often.  Will continue to need medication changes. Daily contact with patient to assess and evaluate symptoms and progress in treatment, Medication management and Plan see below   Plan Schizophrenia Increase Zyprexa to 20 mg p.o. Nightly Add Celexa 5 mg po daily for affective sx. Continue Atarax 25 mg every 6 hourly as needed.  For insomnia Trazodone 50 mg p.o. nightly as needed.  Will order TSH, lipid panel, hba1c, pl.  EKG reviewed - qtc - wnl.  CSW will continue to work on disposition.   Jomarie Longs, MD 07/05/2017, 1:23 PM

## 2017-07-05 NOTE — Progress Notes (Signed)
Patient ID: Collin White, male   DOB: 07/10/1994, 21 y.o.   MRN: [id number]    D:Pt has been very isolative on the unit today, he remained in his room most of the day. Pt did not engage in any treatment, he did not attend any groups. Pt refused to respond to staff unless he was called "Keyla" and use pronouns "she, her, hers". Pt believes in his mind that he is a male. Pt reported that his depression was a 0, his hopelessness was a 0, and his anxiety was a 0. Pt reported that his goal was nothing.Pt reported being negative SI/HI, no AH/VH noted. A: 15 min checks continued for patient safety. R: Pt safety maintained.

## 2017-07-05 NOTE — [facility] Group Notes (Signed)
[name] Group Notes: (Clinical Social Work)   07/05/2017      Type of Therapy:  Group Therapy   Participation Level:  Did Not Attend despite MHT prompting   [name], LCSW 07/05/2017, 12:03 PM

## 2017-07-06 DIAGNOSIS — F640 Transsexualism: Secondary | ICD-10-CM

## 2017-07-06 LAB — HEMOGLOBIN A1C
Hgb A1c MFr Bld: 5.1 % (ref 4.8–5.6)
Mean Plasma Glucose: 99.67 mg/dL

## 2017-07-06 LAB — LIPID PANEL
CHOLESTEROL: 177 mg/dL (ref 0–200)
HDL: 51 mg/dL (ref >40)
LDL CALC: 91 mg/dL (ref 0–99)
TRIGLYCERIDES: 174 mg/dL — AB (ref <150)
Total CHOL/HDL Ratio: 3.5 RATIO
VLDL: 35 mg/dL (ref 0–40)

## 2017-07-06 LAB — TSH: TSH: 1.231 u[IU]/mL (ref 0.350–4.500)

## 2017-07-06 NOTE — Progress Notes (Signed)
Adult Psychoeducational Group Note  Date:  07/06/2017 Time:  12:16 AM  Group Topic/Focus:  Wrap-Up Group:   The focus of this group is to help patients review their daily goal of treatment and discuss progress on daily workbooks.  Participation Level:  Did Not Attend  Participation Quality:  Did Not Attend  Affect:  Did Not Attend  Cognitive:  Did Not Attend  Insight: None  Engagement in Group:  Did Not Attend  Modes of Intervention:  Did Not Attend  Additional Comments:  Pt did not attend evening wrap group this evening.  Collin White  Collin White 07/06/2017, 12:16 AM

## 2017-07-06 NOTE — Progress Notes (Addendum)
Patient ID: Kerrin MoMalcolm Grauberger, male   DOB: 07/10/1994, 21 y.o.   MRN: [id number]  Pt currently presents with a blunted affect and cooperative, isolative behavior. Pt forwards little to Clinical research associatewriter. Reports that her day was "okay." Denies any concerns currently. Pt did attend group tonight. Pt remains hypersexual, pulling at underwear when staff is present in her room. Pt reports good sleep with current medication regimen.   Pt provided with medications per providers orders. Pt's labs and vitals were monitored throughout the night. Pt given a 1:1 about emotional and mental status. Pt supported and encouraged to express concerns and questions.   Pt's safety ensured with 15 minute and environmental checks. Pt currently denies SI/HI and A/V hallucinations. Pt verbally agrees to seek staff if SI/HI or A/VH occurs and to consult with staff before acting on any harmful thoughts. Will continue POC.

## 2017-07-06 NOTE — Progress Notes (Signed)
[name] MD Progress Note  07/06/2017 1:32 PM Collin White  MRN:  [id number] Subjective:   Collin White is a 21 y/o transgender F with preferred name of "Collin White" and history of schizophrenia who was admitted with worsening symptoms of psychosis. Pt was transitioned to regimen featuring zyprexa and she has been observed on the inpatient psychiatry unit. Today upon evaluation, pt reports that she is doing well overall. She reports that her mood is "good." She is sleeping well and her appetite is adequate. She denies SI/HI/AH/VH. She had dose of zyprexa increased over weekend and pt was asked about tolerability, and she gave somewhat tangential response. Pt replied, "Celexa - that's a woman - that's a pill - and that's it." Pt was asked to provide clarification, but she was unable to explain her statement. She does explain that she is tolerating her medications without difficulty or side effect. Discussed with patient about possible referral to ACT team, and she expressed some hesitation as she is "originally from [place]" but now stays in one of her many "houses." Pt was asked if she stays with her cousin at times, and she acknowledged that she does. We discussed that ACT team may be able to meet her wherever she is staying and pt verbalized good understanding. She had no further questions, comments, or concerns.  Principal Problem: Schizophrenia (HCC) Diagnosis:   Patient Active Problem List   Diagnosis Date Noted  . Schizophrenia (HCC) [F20.9] 06/30/2017  . Schizophrenia, paranoid type (HCC) [F20.0] 06/08/2017  . 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 Paranoid schizophrenia (HCC) [F20.0] 09/29/2015  . 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 History of posttraumatic stress disorder (PTSD) [Z86.59] 09/26/2015   Total Time spent with patient: 30 minutes  Past Psychiatric History: see H&P  Past Medical History:   Past Medical History:  Diagnosis Date  . Psychoses (HCC)   . Schizophrenia (HCC)   . Seizures (HCC)     Past Surgical History:  Procedure Laterality Date  . abd surgery s/p traumatic event    . facial reconstructive surgery    . NO PAST SURGERIES  patient stated he had facial reconstruction surgery as a child   . 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 Hypertension Other   . Mental illness Neg Hx    Family Psychiatric  History: see H&P Social History:  Social History   Substance and Sexual Activity  Alcohol Use No     Social History   Substance and Sexual Activity  Drug Use No    Social History   Socioeconomic History  . Marital status: Single    Spouse name: None  . Number of children: None  . Years of education: None  . Highest education level: None  Social Needs  . Financial resource strain: None  . Food insecurity - worry: None  . Food insecurity - inability: None  . Transportation needs - medical: None  . Transportation needs - non-medical: None  Occupational History  . None  Tobacco Use  . Smoking status: Former Smoker    Types: Cigarettes  . Smokeless tobacco: Never Used  Substance and Sexual Activity  . Alcohol use: No  . Drug use: No  . Sexual activity: Yes    Birth control/protection: Condom  Other Topics Concern  . None  Social History Narrative   ** Merged History  Encounter **       ** Merged History Encounter **       Additional Social History:                         Sleep: Good  Appetite:  Good  Current Medications: Current Facility-Administered Medications  Medication Dose Route Frequency Provider Last Rate Last Dose  . acetaminophen (TYLENOL) tablet 650 mg  650 mg Oral Q6H PRN Kerry Hough, PA-C   650 mg at 06/30/17 2105  . alum & mag hydroxide-simeth (MAALOX/MYLANTA) 200-200-20 MG/5ML suspension 30 mL  30 mL Oral Q4H PRN Donell Sievert E, PA-C      .  citalopram (CELEXA) tablet 5 mg  5 mg Oral Daily Eappen, Saramma, MD   5 mg at 07/06/17 0841  . hydrOXYzine (ATARAX/VISTARIL) tablet 25 mg  25 mg Oral Q6H PRN Kerry Hough, PA-C   25 mg at 07/04/17 2132  . ibuprofen (ADVIL,MOTRIN) tablet 400 mg  400 mg Oral Q6H PRN Kerry Hough, PA-C   400 mg at 07/04/17 1527  . OLANZapine zydis (ZYPREXA) disintegrating tablet 5 mg  5 mg Oral Q8H PRN Jomarie Longs, MD       And  . LORazepam (ATIVAN) tablet 1 mg  1 mg Oral PRN Jomarie Longs, MD       And  . ziprasidone (GEODON) injection 20 mg  20 mg Intramuscular PRN Eappen, Saramma, MD      . magnesium hydroxide (MILK OF MAGNESIA) suspension 30 mL  30 mL Oral Daily PRN Donell Sievert E, PA-C      . OLANZapine (ZYPREXA) tablet 20 mg  20 mg Oral QHS Jomarie Longs, MD   20 mg at 07/05/17 2123  . traZODone (DESYREL) tablet 50 mg  50 mg Oral QHS,MR X 1 Kerry Hough, PA-C   50 mg at 07/05/17 2123    Lab Results:  Results for orders placed or performed during the hospital encounter of 06/29/17 (from the past 48 hour(s))  TSH     Status: None   Collection Time: 07/06/17  6:26 AM  Result Value Ref Range   TSH 1.231 0.350 - 4.500 uIU/mL    Comment: Performed by a 3rd Generation assay with a functional sensitivity of <=0.01 uIU/mL. Performed at [facility], [address] [place], [place] [address]   Lipid panel     Status: Abnormal   Collection Time: 07/06/17  6:26 AM  Result Value Ref Range   Cholesterol 177 0 - 200 mg/dL   Triglycerides 604 (H) <150 mg/dL   HDL 51 >54 mg/dL   Total CHOL/HDL Ratio 3.5 RATIO   VLDL 35 0 - 40 mg/dL   LDL Cholesterol 91 0 - 99 mg/dL    Comment:        Total Cholesterol/HDL:CHD Risk Coronary Heart Disease Risk Table                     Men   Women  1/2 Average Risk   3.4   3.3  Average Risk       5.0   4.4  2 X Average Risk   9.6   7.1  3 X Average Risk  23.4   11.0        Use the calculated Patient Ratio above and the CHD Risk  Table to determine the patient's CHD Risk.        ATP III CLASSIFICATION (LDL):  <  100     mg/dL   Optimal  [id number]  mg/dL   Near or Above                    Optimal  130-159  mg/dL   Borderline  [id number]  mg/dL   High  >811>190     mg/dL   Very High Performed at [facility], [address] [place], [place] [address]   Hemoglobin A1c     Status: None   Collection Time: 07/06/17  6:26 AM  Result Value Ref Range   Hgb A1c MFr Bld 5.1 4.8 - 5.6 %    Comment: (NOTE) Pre diabetes:          5.7%-6.4% Diabetes:              >6.4% Glycemic control for   <7.0% adults with diabetes    Mean Plasma Glucose 99.67 mg/dL    Comment: Performed at [facility], [address] [place], [place] [address]    Blood Alcohol level:  Lab Results  Component Value Date   [facility] 06/26/2017   ETH <10 06/18/2017    Metabolic Disorder Labs: Lab Results  Component Value Date   HGBA1C 5.1 07/06/2017   MPG 99.67 07/06/2017   MPG 103 06/10/2017   Lab Results  Component Value Date   PROLACTIN 32.0 (H) 06/10/2017   PROLACTIN 32.6 (H) 09/27/2015   Lab Results  Component Value Date   CHOL 177 07/06/2017   TRIG 174 (H) 07/06/2017   HDL 51 07/06/2017   CHOLHDL 3.5 07/06/2017   VLDL 35 07/06/2017   LDLCALC 91 07/06/2017   LDLCALC 61 06/10/2017    Physical Findings: AIMS: Facial and Oral Movements Muscles of Facial Expression: None, normal Lips and Perioral Area: None, normal Jaw: None, normal Tongue: None, normal,Extremity Movements Upper (arms, wrists, hands, fingers): None, normal Lower (legs, knees, ankles, toes): None, normal, Trunk Movements Neck, shoulders, hips: None, normal, Overall Severity Severity of abnormal movements (highest score from questions above): None, normal Incapacitation due to abnormal movements: None, normal Patient's awareness of abnormal movements (rate only patient's report): No Awareness, Dental Status Current problems with teeth and/or dentures?:  No Does patient usually wear dentures?: No  CIWA:    COWS:     Musculoskeletal: Strength & Muscle Tone: within normal limits Gait & Station: normal Patient leans: Backward  Psychiatric Specialty Exam: Physical Exam  Nursing note and vitals reviewed.   Review of Systems  Constitutional: Negative for chills and fever.  Respiratory: Negative for cough and shortness of breath.   Gastrointestinal: Negative for heartburn and nausea.  Psychiatric/Behavioral: Negative for depression, hallucinations and suicidal ideas. The patient is not nervous/anxious.     Blood pressure 120/80, pulse (!) 102, temperature 98 F (36.7 C), resp. rate 18, height 6' (1.829 m), weight 63.5 kg (140 lb).Body mass index is 18.99 kg/m.  General Appearance: Casual and Fairly Groomed  Eye Contact:  Good  Speech:  Clear and Coherent  Volume:  Decreased  Mood:  Euthymic  Affect:  Congruent and Flat  Thought Process:  Disorganized and Descriptions of Associations: Loose  Orientation:  Full (Time, Place, and Person)  Thought Content:  Delusions  Suicidal Thoughts:  No  Homicidal Thoughts:  No  Memory:  Immediate;   Good Recent;   Good Remote;   Good  Judgement:  Impaired  Insight:  Lacking  Psychomotor Activity:  Normal  Concentration:  Concentration: Good  Recall:  Good  Fund of Knowledge:  Good  Language:  Good  Akathisia:  No  Handed:    AIMS (if indicated):     Assets:  Communication Skills Physical Health Resilience Social Support  ADL's:  Intact  Cognition:  WNL  Sleep:  Number of Hours: 5.75     Treatment Plan Summary: Daily contact with patient to assess and evaluate symptoms and progress in treatment and Medication management. Pt continues to display some thought disorganization, grandiose delusions, and poor insight. She is tolerating her medications without difficulty or side effects. We are looking into ACT team referrals.  - Continue inpatient hospitalization - Schizophrenia   -  Continue celexa 5mg  qDay -Continue zyprexa 20mg  qhs - Continue atarax 25mg  q6h prn anxiety - Continue trazodone 50mg  qhs prn insomnia  - Encourage participation in groups and the therapeutic milieu - Discharge planning will be ongoing    Micheal Likenshristopher T Del Wiseman, MD 07/06/2017, 1:32 PM

## 2017-07-06 NOTE — Plan of Care (Signed)
Pt has been in his room this afternoon not engaged on the unit.

## 2017-07-06 NOTE — Progress Notes (Signed)
Recreation Therapy Notes  Date: 07/06/17 Time: 1000 Location: 500 Hall Dayroom  Group Topic: Goal Setting  Goal Area(s) Addresses:  Patient will be able to identify at least 3 life goals.  Patient will be able to identify benefit of investing in life goals.  Patient will be able to identify benefit of setting life goals.   Intervention: Worksheet  Activity: Garment/textile technologistGoal Planning.  Patients were to create goals they hope to accomplish within the next week, month, year and five years.  Patients were to also identify any obstacles that would prevent them from reaching their goals; what they need to do to achieve their goals and what they can start doing tomorrow to work on their goals.  Education:  Discharge Planning, Coping Skills, Life Goals   Education Outcome: Acknowledges Education/In Group Clarification Provided/Needs Additional Education  Clinical Observations: Pt did not attend group.   Caroll RancherMarjette Leyton Magoon, LRT/CTRS         Lillia AbedLindsay, [name] A 07/06/2017 12:12 PM

## 2017-07-06 NOTE — [facility] Group Notes (Signed)
[facility]  07/06/2017 , 1:23 PM    Type of Therapy:  [facility] Presentation  Participation Level:  Active  Participation Quality:  Attentive  Affect:  Blunted  Cognitive:  Oriented  Insight:  Limited  Engagement in Therapy:  Engaged  Modes of Intervention:  Discussion, Education and Socialization  Summary of Progress/Problems:  Tammi  from [employer] came to present her recovery story, encourage group  members to share something about their story, and present information about the [facility].  Invited.  Chose to not attend.  Daryel Geraldorth, Tien Spooner B 07/06/2017 , 1:23 PM

## 2017-07-06 NOTE — Progress Notes (Signed)
Pt has been isolative to her room this afternoon and is lying in her bed with no covers and only underwear. During a fire drill this afternoon, pt came out of her room with her scrubs and went immediately back to her room after the drill and took her clothes off. Pt verbally denies si and hi. She verbally denies voices. A:Offered support and 15 minute checks. R:Safety maintained on the unit.

## 2017-07-06 NOTE — Progress Notes (Signed)
Group Note:  The patient had little to share with the group except that he wants to be referred to as a she and that she will be discharged tomorrow or Wednesday. He was unable to set a goal for tomorrow.

## 2017-07-06 NOTE — Progress Notes (Signed)
Pt in room with scrub top on and wearing disposable underwear.  Pt is arranging underwear so that it looks like a thong and hides his genitals. Pt is flat, guarded and depressed. Pt is med compliant but stays in room in bed during shift. Pt remains safe on unit.

## 2017-07-07 LAB — PROLACTIN: PROLACTIN: 21.6 ng/mL — AB (ref 4.0–15.2)

## 2017-07-07 NOTE — Progress Notes (Signed)
Dar Note: Patient presents with flat affect and depressed mood.  Remained withdrawn and isolates to his room most of this shift.  Medication given as prescribed. Patient encouraged to do his grooming and hygiene.  Patient in his room in bed with just his underwear.  No interaction with staff or peers.  Routine safety checks maintained.  Patient is safe on the unit.

## 2017-07-07 NOTE — Progress Notes (Signed)
Recreation Therapy Notes  Date: 07/07/17 Time: 1000 Location: 500 Hall Dayroom  Group Topic: Coping Skills  Goal Area(s) Addresses:  Patient will be able to identify positive coping skills. Patient will be able to identify benefits of positive coping skills. Patient will be able to identify benefits of using coping skills post d/c.  Intervention: Worksheet, scissors, glue sticks, magazines  Activity: Coping Skills.  Patients were given a worksheet that was broken down into 5 categories (diversions, social, cognitive, tension releaser and physical).  Patients were to identify picture from the magazines that represent at least 2 coping skills they would use for each category.   Education: PharmacologistCoping Skills, Building control surveyorDischarge Planning.   Education Outcome: Acknowledges understanding/In group clarification offered/Needs additional education.   Clinical Observations/Feedback: Pt did not attend group.   Caroll RancherMarjette Kaytelynn Scripter, LRT/CTRS         Caroll RancherLindsay, Jerrol Helmers A 07/07/2017 12:50 PM

## 2017-07-07 NOTE — Progress Notes (Signed)
[facility] MD Progress Note  07/07/2017 3:22 PM Collin White  MRN:  [id number]  Subjective: Collin White reports, "I'm doing good. Ain't got nothing wrong with me. I'm sleeping well. I can't take the sleep medicine you all are giving me. I came back here because I should not have been discharged from this place in the first place".  Collin White is a 21 y/o transgender F with preferred name of "Collin White" and history of schizophrenia who was admitted with worsening symptoms of psychosis. Pt was transitioned to regimen featuring zyprexa and she has been observed on the inpatient psychiatry unit. Today upon evaluation, pt reports that she is doing well overall. She reports that her mood is "good." She is sleeping well and her appetite is adequate. She denies SI/HI/AH/VH. She had dose of zyprexa increased over weekend. Yesterday, the Md asked pt about tolerability, and she gave somewhat tangential response. Pt replied, "Celexa - that's a woman - that's a pill - and that's it." Pt was asked to provide clarification, but she was unable to explain her statement. She does explain that she is tolerating her medications without difficulty or side effect. Discussed with patient about possible referral to ACT team, and she expressed some hesitation as she is "originally from [place]" but now stays in one of her many "houses." Pt was asked if she stays with her cousin at times, and she acknowledged that she does. We discussed that ACT team may be able to meet her wherever she is staying and pt verbalized good understanding. She had no further questions, comments, or concerns. She presents malodorous, well unkept. She is being encouraged to bath & change clothes.  Principal Problem: Schizophrenia (HCC)  Diagnosis:   Patient Active Problem List   Diagnosis Date Noted  . Schizophrenia (HCC) [F20.9] 06/30/2017  . Schizophrenia, paranoid type (HCC) [F20.0] 06/08/2017  . 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 Paranoid schizophrenia (HCC) [F20.0] 09/29/2015  . 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 History of posttraumatic stress disorder (PTSD) [Z86.59] 09/26/2015   Total Time spent with patient: 15 minutes  Past Psychiatric History: See H&P  Past Medical History:  Past Medical History:  Diagnosis Date  . Psychoses (HCC)   . Schizophrenia (HCC)   . Seizures (HCC)     Past Surgical History:  Procedure Laterality Date  . abd surgery s/p traumatic event    . facial reconstructive surgery    . NO PAST SURGERIES  patient stated he had facial reconstruction surgery as a child   . 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 Hypertension Other   . Mental illness Neg Hx    Family Psychiatric  History: See H&P  Social History:  Social History   Substance and Sexual Activity  Alcohol Use No     Social History   Substance and Sexual Activity  Drug Use No    Social History   Socioeconomic History  . Marital status: Single    Spouse name: None  . Number of children: None  . Years of education: None  . Highest education level: None  Social Needs  . Financial resource strain: None  . Food insecurity - worry: None  . Food insecurity - inability: None  . Transportation needs - medical: None  . Transportation needs - non-medical: None  Occupational History  . None  Tobacco  Use  . Smoking status: Former Smoker    Types: Cigarettes  . Smokeless tobacco: Never Used  Substance and Sexual Activity  . Alcohol use: No  . Drug use: No  . Sexual activity: Yes    Birth control/protection: Condom  Other Topics Concern  . None  Social History Narrative   ** Merged History Encounter **       ** Merged History Encounter **       Additional Social History:   Sleep: Good  Appetite:  Good  Current Medications: Current Facility-Administered Medications   Medication Dose Route Frequency Provider Last Rate Last Dose  . acetaminophen (TYLENOL) tablet 650 mg  650 mg Oral Q6H PRN Kerry HoughSimon, Spencer E, PA-C   650 mg at 06/30/17 2105  . alum & mag hydroxide-simeth (MAALOX/MYLANTA) 200-200-20 MG/5ML suspension 30 mL  30 mL Oral Q4H PRN Donell SievertSimon, Spencer E, PA-C      . citalopram (CELEXA) tablet 5 mg  5 mg Oral Daily Eappen, Saramma, MD   5 mg at 07/07/17 0815  . hydrOXYzine (ATARAX/VISTARIL) tablet 25 mg  25 mg Oral Q6H PRN Kerry HoughSimon, Spencer E, PA-C   25 mg at 07/04/17 2132  . ibuprofen (ADVIL,MOTRIN) tablet 400 mg  400 mg Oral Q6H PRN Kerry HoughSimon, Spencer E, PA-C   400 mg at 07/04/17 1527  . OLANZapine zydis (ZYPREXA) disintegrating tablet 5 mg  5 mg Oral Q8H PRN Jomarie LongsEappen, Saramma, MD       And  . LORazepam (ATIVAN) tablet 1 mg  1 mg Oral PRN Jomarie LongsEappen, Saramma, MD       And  . ziprasidone (GEODON) injection 20 mg  20 mg Intramuscular PRN Eappen, Saramma, MD      . magnesium hydroxide (MILK OF MAGNESIA) suspension 30 mL  30 mL Oral Daily PRN Donell SievertSimon, Spencer E, PA-C      . OLANZapine (ZYPREXA) tablet 20 mg  20 mg Oral QHS Jomarie LongsEappen, Saramma, MD   20 mg at 07/06/17 2124  . traZODone (DESYREL) tablet 50 mg  50 mg Oral QHS,MR X 1 Kerry HoughSimon, Spencer E, PA-C   50 mg at 07/06/17 2123    Lab Results:  Results for orders placed or performed during the hospital encounter of 06/29/17 (from the past 48 hour(s))  TSH     Status: None   Collection Time: 07/06/17  6:26 AM  Result Value Ref Range   TSH 1.231 0.350 - 4.500 uIU/mL    Comment: Performed by a 3rd Generation assay with a functional sensitivity of <=0.01 uIU/mL. Performed at [facility], [address] [place], [place] [address]   Lipid panel     Status: Abnormal   Collection Time: 07/06/17  6:26 AM  Result Value Ref Range   Cholesterol 177 0 - 200 mg/dL   Triglycerides 981174 (H) <150 mg/dL   HDL 51 >19>40 mg/dL   Total CHOL/HDL Ratio 3.5 RATIO   VLDL 35 0 - 40 mg/dL   LDL Cholesterol 91 0 - 99 mg/dL     Comment:        Total Cholesterol/HDL:CHD Risk Coronary Heart Disease Risk Table                     Men   Women  1/2 Average Risk   3.4   3.3  Average Risk       5.0   4.4  2 X Average Risk   9.6   7.1  3 X Average Risk  23.4  11.0        Use the calculated Patient Ratio above and the CHD Risk Table to determine the patient's CHD Risk.        ATP III CLASSIFICATION (LDL):  <100     mg/dL   Optimal  098-119100-129  mg/dL   Near or Above                    Optimal  130-159  mg/dL   Borderline  147-829160-189  mg/dL   High  >562>190     mg/dL   Very High Performed at [facility], [address] [place], [place] [address]   Hemoglobin A1c     Status: None   Collection Time: 07/06/17  6:26 AM  Result Value Ref Range   Hgb A1c MFr Bld 5.1 4.8 - 5.6 %    Comment: (NOTE) Pre diabetes:          5.7%-6.4% Diabetes:              >6.4% Glycemic control for   <7.0% adults with diabetes    Mean Plasma Glucose 99.67 mg/dL    Comment: Performed at [facility], [address] [place], [place] [address]  Prolactin     Status: Abnormal   Collection Time: 07/06/17  6:26 AM  Result Value Ref Range   Prolactin 21.6 (H) 4.0 - 15.2 ng/mL    Comment: (NOTE) Performed At: [facility] [address] [place], [place] [id number] Jolene SchimkeNagendra Sanjai MD LK:[phone] Performed at [facility], [address] [place], [place] [address]     Blood Alcohol level:  Lab Results  Component Value Date   ETH <10 06/26/2017   ETH <10 06/18/2017    Metabolic Disorder Labs: Lab Results  Component Value Date   HGBA1C 5.1 07/06/2017   MPG 99.67 07/06/2017   MPG 103 06/10/2017   Lab Results  Component Value Date   PROLACTIN 21.6 (H) 07/06/2017   PROLACTIN 32.0 (H) 06/10/2017   Lab Results  Component Value Date   CHOL 177 07/06/2017   TRIG 174 (H) 07/06/2017   HDL 51 07/06/2017   CHOLHDL 3.5 07/06/2017   VLDL 35 07/06/2017   LDLCALC 91 07/06/2017   LDLCALC 61  06/10/2017    Physical Findings: AIMS: Facial and Oral Movements Muscles of Facial Expression: None, normal Lips and Perioral Area: None, normal Jaw: None, normal Tongue: None, normal,Extremity Movements Upper (arms, wrists, hands, fingers): None, normal Lower (legs, knees, ankles, toes): None, normal, Trunk Movements Neck, shoulders, hips: None, normal, Overall Severity Severity of abnormal movements (highest score from questions above): None, normal Incapacitation due to abnormal movements: None, normal Patient's awareness of abnormal movements (rate only patient's report): No Awareness, Dental Status Current problems with teeth and/or dentures?: No Does patient usually wear dentures?: No  CIWA:    COWS:     Musculoskeletal: Strength & Muscle Tone: within normal limits Gait & Station: normal Patient leans: Backward  Psychiatric Specialty Exam: Physical Exam  Nursing note and vitals reviewed.   Review of Systems  Constitutional: Negative for chills and fever.  Respiratory: Negative for cough and shortness of breath.   Gastrointestinal: Negative for heartburn and nausea.  Psychiatric/Behavioral: Negative for depression, hallucinations and suicidal ideas. The patient is not nervous/anxious.     Blood pressure 121/80, pulse 91, temperature 97.8 F (36.6 C), temperature source Oral, resp. rate 16, height 6' (1.829 m), weight 63.5 kg (140 lb).Body mass index is  18.99 kg/m.  General Appearance: Casual and Fairly Groomed  Eye Contact:  Good  Speech:  Clear and Coherent  Volume:  Decreased  Mood:  Euthymic  Affect:  Congruent and Flat  Thought Process:  Disorganized and Descriptions of Associations: Loose  Orientation:  Full (Time, Place, and Person)  Thought Content:  Delusions  Suicidal Thoughts:  No  Homicidal Thoughts:  No  Memory:  Immediate;   Good Recent;   Good Remote;   Good  Judgement:  Impaired  Insight:  Lacking  Psychomotor Activity:  Normal   Concentration:  Concentration: Good  Recall:  Good  Fund of Knowledge:  Good  Language:  Good  Akathisia:  No  Handed:    AIMS (if indicated):     Assets:  Manufacturing systems engineer Physical Health Resilience Social Support  ADL's:  Intact  Cognition:  WNL  Sleep:  Number of Hours: 6.5   Treatment Plan Summary: Daily contact with patient to assess and evaluate symptoms and progress in treatment and Medication management. Pt continues to display some thought disorganization, grandiose delusions, and poor insight. She is tolerating her medications without difficulty or side effects. We are looking into ACT team referrals.  Will continue today 07/07/2017 plan as below except where it is noted.  - Continue inpatient hospitalization - Schizophrenia   - Continue celexa 5mg  qDay -Continue zyprexa 20mg  qhs - Continue atarax 25mg  q6h prn anxiety - Continue trazodone 50mg  qhs prn insomnia  - Encourage participation in groups and the therapeutic milieu - Discharge planning will be ongoing  Sanjuana Kava, NP, PMHNP, 07/07/2017, 3:22 PMPatient ID: Collin White, male   DOB: 07/10/1994, 21 y.o.   MRN: [id number]

## 2017-07-07 NOTE — Plan of Care (Signed)
Safety Care Plan Documentation  Goal: Periods of time without injury will increase Intervention: Patient is on q15 minute safety checks and low fall risk precautions. Patient verbally contracts for safety on the unit. Outcome: Patient remains safe at this time  07/07/2017 11:55 PM - Progressing by Ferrel Loganollazo, Jalie Eiland A, RN

## 2017-07-07 NOTE — Progress Notes (Signed)
Patient ID: Collin White, male   DOB: 07/10/1994, 21 y.o.   MRN: [id number]   Nursing Progress Note 1900-0730  D) Patient requests to be addressed as "[name]" with male pronouns. Patient presents with flat affect. Patient is isolative to her room but does get up and come to medication window per writer request. Patient agreeable to taking scheduled Zyprexa but refused scheduled Trazodone stating "it makes me too dizzy in the morning". Patient denies SI/HI/AVH or pain. Patient contracts for safety on the unit. Patient denies questions or concerns for Clinical research associatewriter.  A) Emotional support given. 1:1 interaction and active listening provided. Patient medicated as prescribed. Medications and plan of care reviewed with patient. Patient verbalized understanding without further questions. Snacks and fluids provided. Opportunities for questions or concerns presented to patient. Patient encouraged to continue to work on treatment goals. Labs, vital signs and patient behavior monitored throughout shift. Patient safety maintained with q15 min safety checks. Low fall risk precautions in place and reviewed with patient; patient verbalized understanding.  R) Patient receptive to interaction with nurse. Patient remains safe on the unit at this time. Patient denies any adverse medication reactions at this time. Patient is resting in bed without complaints. Will continue to monitor.

## 2017-07-08 NOTE — Progress Notes (Signed)
[facility] MD Progress Note  07/08/2017 4:17 PM Collin White  MRN:  [id number] Subjective:   Collin White is a 21 y/o transgender F with preferred name of "Collin White" and history of schizophrenia who was admitted with worsening symptoms of psychosis. Pt was restarted on antipsychotic medications and titrated up on dose of zyprexa. Today she reports that she is doing well overall. She denies SI/HI/AH/VH. She is tolerating her medications without difficulty or side effects. Pt continues to endorse grandiose delusional structure that she has multiple homes and an apartment/hotel suite at the [facility]. When asked for additional details about these arrangements such as an address of one of her homes, pt refuses to provide that information. Attempted to discuss with patient that we would like to increase the community resources available to her such as refer to an ACT team so that she has close follow up after discharge and hopefully can avoid further hospitalizations. Pt is declining at this time and states that she would like to follow up at [facility]. We discussed that we would likely work with the transition care team at [facility], but pt would have to work with SW team regarding sharing information about where she stays so that referrals can be made. Pt had no further questions, comments, or concerns.  Principal Problem: Schizophrenia (HCC) Diagnosis:   Patient Active Problem List   Diagnosis Date Noted  . Schizophrenia (HCC) [F20.9] 06/30/2017  . Schizophrenia, paranoid type (HCC) [F20.0] 06/08/2017  . 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 Paranoid schizophrenia (HCC) [F20.0] 09/29/2015  . 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 History of posttraumatic stress disorder (PTSD) [Z86.59] 09/26/2015   Total Time spent with patient: 30 minutes  Past Psychiatric History: see H&P  Past Medical History:  Past Medical  History:  Diagnosis Date  . Psychoses (HCC)   . Schizophrenia (HCC)   . Seizures (HCC)     Past Surgical History:  Procedure Laterality Date  . abd surgery s/p traumatic event    . facial reconstructive surgery    . NO PAST SURGERIES  patient stated he had facial reconstruction surgery as a child   . 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 Hypertension Other   . Mental illness Neg Hx    Family Psychiatric  History: see H&P Social History:  Social History   Substance and Sexual Activity  Alcohol Use No     Social History   Substance and Sexual Activity  Drug Use No    Social History   Socioeconomic History  . Marital status: Single    Spouse name: None  . Number of children: None  . Years of education: None  . Highest education level: None  Social Needs  . Financial resource strain: None  . Food insecurity - worry: None  . Food insecurity - inability: None  . Transportation needs - medical: None  . Transportation needs - non-medical: None  Occupational History  . None  Tobacco Use  . Smoking status: Former Smoker    Types: Cigarettes  . Smokeless tobacco: Never Used  Substance and Sexual Activity  . Alcohol use: No  . Drug use: No  . Sexual activity: Yes    Birth control/protection: Condom  Other Topics Concern  . None  Social History Narrative   ** Merged History Encounter **       **  Merged History Encounter **       Additional Social History:                         Sleep: Good  Appetite:  Good  Current Medications: Current Facility-Administered Medications  Medication Dose Route Frequency Provider Last Rate Last Dose  . acetaminophen (TYLENOL) tablet 650 mg  650 mg Oral Q6H PRN Kerry HoughSimon, Spencer E, PA-C   650 mg at 06/30/17 2105  . alum & mag hydroxide-simeth (MAALOX/MYLANTA) 200-200-20 MG/5ML suspension 30 mL  30 mL Oral Q4H PRN Donell SievertSimon, Spencer E, PA-C      . citalopram  (CELEXA) tablet 5 mg  5 mg Oral Daily Eappen, Saramma, MD   5 mg at 07/08/17 0825  . hydrOXYzine (ATARAX/VISTARIL) tablet 25 mg  25 mg Oral Q6H PRN Kerry HoughSimon, Spencer E, PA-C   25 mg at 07/04/17 2132  . ibuprofen (ADVIL,MOTRIN) tablet 400 mg  400 mg Oral Q6H PRN Kerry HoughSimon, Spencer E, PA-C   400 mg at 07/04/17 1527  . OLANZapine zydis (ZYPREXA) disintegrating tablet 5 mg  5 mg Oral Q8H PRN Jomarie LongsEappen, Saramma, MD       And  . LORazepam (ATIVAN) tablet 1 mg  1 mg Oral PRN Jomarie LongsEappen, Saramma, MD       And  . ziprasidone (GEODON) injection 20 mg  20 mg Intramuscular PRN Eappen, Saramma, MD      . magnesium hydroxide (MILK OF MAGNESIA) suspension 30 mL  30 mL Oral Daily PRN Donell SievertSimon, Spencer E, PA-C      . OLANZapine (ZYPREXA) tablet 20 mg  20 mg Oral QHS Jomarie LongsEappen, Saramma, MD   20 mg at 07/07/17 2124  . traZODone (DESYREL) tablet 50 mg  50 mg Oral QHS,MR X 1 Kerry HoughSimon, Spencer E, PA-C   50 mg at 07/06/17 2123    Lab Results: No results found for this or any previous visit (from the past 48 hour(s)).  Blood Alcohol level:  Lab Results  Component Value Date   ETH <10 06/26/2017   ETH <10 06/18/2017    Metabolic Disorder Labs: Lab Results  Component Value Date   HGBA1C 5.1 07/06/2017   MPG 99.67 07/06/2017   MPG 103 06/10/2017   Lab Results  Component Value Date   PROLACTIN 21.6 (H) 07/06/2017   PROLACTIN 32.0 (H) 06/10/2017   Lab Results  Component Value Date   CHOL 177 07/06/2017   TRIG 174 (H) 07/06/2017   HDL 51 07/06/2017   CHOLHDL 3.5 07/06/2017   VLDL 35 07/06/2017   LDLCALC 91 07/06/2017   LDLCALC 61 06/10/2017    Physical Findings: AIMS: Facial and Oral Movements Muscles of Facial Expression: None, normal Lips and Perioral Area: None, normal Jaw: None, normal Tongue: None, normal,Extremity Movements Upper (arms, wrists, hands, fingers): None, normal Lower (legs, knees, ankles, toes): None, normal, Trunk Movements Neck, shoulders, hips: None, normal, Overall Severity Severity of  abnormal movements (highest score from questions above): None, normal Incapacitation due to abnormal movements: None, normal Patient's awareness of abnormal movements (rate only patient's report): No Awareness, Dental Status Current problems with teeth and/or dentures?: No Does patient usually wear dentures?: No  CIWA:    COWS:     Musculoskeletal: Strength & Muscle Tone: within normal limits Gait & Station: normal Patient leans: N/A  Psychiatric Specialty Exam: Physical Exam  Nursing note and vitals reviewed.   Review of Systems  Constitutional: Negative for chills and fever.  Respiratory: Negative for cough and  shortness of breath.   Cardiovascular: Negative for chest pain.  Gastrointestinal: Negative for heartburn and nausea.  Psychiatric/Behavioral: Negative for depression, hallucinations and suicidal ideas. The patient is not nervous/anxious.     Blood pressure 122/62, pulse (!) 118, temperature 98.1 F (36.7 C), temperature source Oral, resp. rate 16, height 6' (1.829 m), weight 63.5 kg (140 lb).Body mass index is 18.99 kg/m.  General Appearance: Disheveled  Eye Contact:  Good  Speech:  Clear and Coherent  Volume:  Decreased  Mood:  Anxious  Affect:  Congruent and Flat  Thought Process:  Disorganized and Descriptions of Associations: Loose  Orientation:  Full (Time, Place, and Person)  Thought Content:  Delusions  Suicidal Thoughts:  No  Homicidal Thoughts:  No  Memory:  Immediate;   Fair Recent;   Fair Remote;   Fair  Judgement:  Poor  Insight:  Lacking  Psychomotor Activity:  Normal  Concentration:  Concentration: Fair  Recall:  [employer]Fair  Fund of Knowledge:  Fair  Language:  Good  Akathisia:  No  Handed:    AIMS (if indicated):     Assets:  Manufacturing systems engineerCommunication Skills Physical Health Resilience Social Support  ADL's:  Intact  Cognition:  WNL  Sleep:  Number of Hours: 6.25     Treatment Plan Summary: Daily contact with patient to assess and evaluate symptoms  and progress in treatment and Medication management. Pt continues to have grandiose delusional structure that she owns multiple home and a hotel suite, but she denies SI/HI/AH/VH. She is disorganized to the degree that she would have difficulty meeting her ADL's outside the hospital and she is refusing to work with SW team in arranging follow up care at this time, but she does indicate she would be willing to work with [facility]. SW team will continue to attempt to arrange for aftercare with the patient.  - Continue inpatient hospitalization - Schizophrenia             - Continue celexa 5mg  qDay -Continuezyprexa 20mg  qhs - Continue atarax 25mg  q6h prn anxiety - Continue trazodone 50mg  qhs prn insomnia - Encourage participation in groups and the therapeutic milieu - Discharge planning will be ongoing  Collin Likenshristopher T Tammatha Cobb, MD 07/08/2017, 4:17 PM

## 2017-07-08 NOTE — Progress Notes (Signed)
Recreation Therapy Notes  Date: 07/08/17 Time: 1000 Location: [address] Dayroom  Group Topic: Self-Expression  Goal Area(s) Addresses:  Patient will successfully identify the things they are thankful for. Patient will successfully identify benefit of expressing themselves in a healthy manner.  Intervention: Paper, markers  Activity: What I'm Thankful For.  Patients were given a blank sheet of paper outlined with a picture frame.  Patients were to identify the things they are thankful for.  Patients could express their themselves through poem, song, or any appropriate way they choose.   Education:  Self-Expression, Discharge Planning.   Education Outcome: Acknowledges education/In group clarification offered/Needs additional education  Clinical Observations/Feedback: Pt did not attend group.     Oseph Imburgia, LRT/CTRS         Alison Breeding A 07/08/2017 11:58 AM 

## 2017-07-08 NOTE — Progress Notes (Signed)
DAR NOTE: Patient presents with flat affect and depressed mood.  Denies pain, auditory and visual hallucinations.  Described energy level as normal and concentration as good.  Rates depression at 0, hopelessness at 0, and anxiety at 0.  Maintained on routine safety checks.  Medications given as prescribed.  Support and encouragement offered as needed.  Attended group and participated.  States goal for today is "getting out of here."  Patient remain withdrawn and isolative to his room.  Encouraged and reminded to be visible in milieu.  Offered no complaint.

## 2017-07-08 NOTE — Progress Notes (Signed)
Group Note:   The patient did not attend group this evening since he was asleep in his bedroom.

## 2017-07-08 NOTE — [facility] Group Notes (Signed)
Type of Therapy and Topic:  Group Therapy: Resilience  Participation Level:  Did not attend   Description of Group:    In this group patients will be encouraged to explore what they see as obstacles to their own wellness and recovery. They will be guided to discuss their thoughts, feelings, and behaviors related to these obstacles. The group will process together ways to cope with barriers, with attention given to specific choices patients can make. Each patient will be challenged to identify changes they are motivated to make in order to overcome their obstacles. This group will be process-oriented, with patients participating in exploration of their own experiences as well as giving and receiving support and challenge from other group members.   Therapeutic Goals: 1. Patient will identify personal and current obstacles as they relate to admission. 2. Patient will identify barriers that currently interfere with their wellness or overcoming obstacles.  3. Patient will identify feelings, thought process and behaviors related to these barriers. 4. Patient will identify two changes they are willing to make to overcome these obstacles:      Summary of Patient Progress     Therapeutic Modalities:   Cognitive Behavioral Therapy Solution Focused Therapy Motivational Interviewing Relapse Prevention Therapy    Johny ShearsCassandra Emil Klassen MSW, [facility] CenterCSWA 07/08/2017 3:07 PM

## 2017-07-08 NOTE — Progress Notes (Signed)
D    Pt isolated to his room most of the evening but did come out for medications and a snack after being prompted    His interactions are minimal  His behavior is appropriate   He refused to take his trazadone and said he didn't need it to sleep A   Verbal support given   Medications administered and effectiveness monitored  Q 15 min checks R   Pt is safe at present time

## 2017-07-08 NOTE — Tx Team (Signed)
Interdisciplinary Treatment and Diagnostic Plan Update  07/08/2017 Time of Session: 11:40 AM  Collin White MRN: [id number]  Principal Diagnosis:  Schizophrenia  Secondary Diagnoses: Principal Problem:   Schizophrenia ([place])   Current Medications:  Current Facility-Administered Medications  Medication Dose Route Frequency Provider Last Rate Last Dose  . acetaminophen (TYLENOL) tablet 650 mg  650 mg Oral Q6H PRN Laverle Hobby, PA-C   650 mg at 06/30/17 2105  . alum & mag hydroxide-simeth (MAALOX/MYLANTA) 200-200-20 MG/5ML suspension 30 mL  30 mL Oral Q4H PRN Patriciaann Clan E, PA-C      . citalopram (CELEXA) tablet 5 mg  5 mg Oral Daily Eappen, Saramma, MD   5 mg at 07/08/17 0825  . hydrOXYzine (ATARAX/VISTARIL) tablet 25 mg  25 mg Oral Q6H PRN Laverle Hobby, PA-C   25 mg at 07/04/17 2132  . ibuprofen (ADVIL,MOTRIN) tablet 400 mg  400 mg Oral Q6H PRN Laverle Hobby, PA-C   400 mg at 07/04/17 1527  . OLANZapine zydis (ZYPREXA) disintegrating tablet 5 mg  5 mg Oral Q8H PRN Ursula Alert, MD       And  . LORazepam (ATIVAN) tablet 1 mg  1 mg Oral PRN Ursula Alert, MD       And  . ziprasidone (GEODON) injection 20 mg  20 mg Intramuscular PRN Eappen, Saramma, MD      . magnesium hydroxide (MILK OF MAGNESIA) suspension 30 mL  30 mL Oral Daily PRN Patriciaann Clan E, PA-C      . OLANZapine (ZYPREXA) tablet 20 mg  20 mg Oral QHS Ursula Alert, MD   20 mg at 07/07/17 2124  . traZODone (DESYREL) tablet 50 mg  50 mg Oral QHS,MR X 1 Laverle Hobby, PA-C   50 mg at 07/06/17 2123    PTA Medications: Medications Prior to Admission  Medication Sig Dispense Refill Last Dose  . ARIPiprazole (ABILIFY) 10 MG tablet Take 1 tablet (10 mg total) by mouth daily. For mood control (Patient not taking: Reported on 06/20/2017) 30 tablet 0 Not Taking at Unknown time  . benztropine (COGENTIN) 1 MG tablet Take 1 tablet (1 mg total) by mouth at bedtime. For prevention of drug induced tremors (Patient  not taking: Reported on 06/20/2017) 30 tablet 0 Not Taking at Unknown time  . ibuprofen (ADVIL,MOTRIN) 200 MG tablet Take 400 mg by mouth every 6 (six) hours as needed for moderate pain.   Past Month at Unknown time  . ibuprofen (ADVIL,MOTRIN) 800 MG tablet Take 1 tablet (800 mg total) 3 (three) times daily by mouth. (Patient not taking: Reported on 06/25/2017) 21 tablet 0 Not Taking at Unknown time  . lidocaine (LIDODERM) 5 % Place 1 patch daily onto the skin. Remove & Discard patch within 12 hours or as directed by MD (Patient not taking: Reported on 06/25/2017) 30 patch 0 Not Taking at Unknown time  . methocarbamol (ROBAXIN) 500 MG tablet Take 1 tablet (500 mg total) 2 (two) times daily by mouth. (Patient not taking: Reported on 06/25/2017) 20 tablet 0 Not Taking at Unknown time  . naproxen (NAPROSYN) 500 MG tablet Take 1 tablet (500 mg total) 2 (two) times daily by mouth. (Patient not taking: Reported on 06/25/2017) 30 tablet 0 Not Taking at Unknown time  . ondansetron (ZOFRAN ODT) 4 MG disintegrating tablet Take 1 tablet (4 mg total) by mouth every 8 (eight) hours as needed for nausea or vomiting. (Patient not taking: Reported on 06/21/2017) 10 tablet 0 Not Taking at Unknown  time  . Oxcarbazepine (TRILEPTAL) 300 MG tablet Take 1 tablet (300 mg total) by mouth 2 (two) times daily. For mood stabilization (Patient not taking: Reported on 06/20/2017) 60 tablet 0 Not Taking at Unknown time  . traZODone (DESYREL) 50 MG tablet Take 1 tablet (50 mg total) by mouth at bedtime. For sleep (Patient not taking: Reported on 06/20/2017) 30 tablet 0 Not Taking at Unknown time    Patient Stressors: Health problems Medication change or noncompliance  Patient Strengths: Ability for insight Average or above average intelligence General fund of knowledge  Treatment Modalities: Medication Management, Group therapy, Case management,  1 to 1 session with clinician, Psychoeducation, Recreational therapy.   Physician  Treatment Plan for Primary Diagnosis: Schizophrenia Long Term Goal(s): Improvement in symptoms so as ready for discharge  Short Term Goals: Compliance with prescribed medications will improve Ability to identify changes in lifestyle to reduce recurrence of condition will improve Ability to demonstrate self-control will improve Ability to identify and develop effective coping behaviors will improve  Medication Management: Evaluate patient's response, side effects, and tolerance of medication regimen.  Therapeutic Interventions: 1 to 1 sessions, Unit Group sessions and Medication administration.  Evaluation of Outcomes: Progressing  Physician Treatment Plan for Secondary Diagnosis: Principal Problem:   Schizophrenia ([place])   Long Term Goal(s): Improvement in symptoms so as ready for discharge  Short Term Goals: Compliance with prescribed medications will improve Ability to identify changes in lifestyle to reduce recurrence of condition will improve Ability to demonstrate self-control will improve Ability to identify and develop effective coping behaviors will improve  Medication Management: Evaluate patient's response, side effects, and tolerance of medication regimen.  Therapeutic Interventions: 1 to 1 sessions, Unit Group sessions and Medication administration.  Evaluation of Outcomes: Progressing   11/15: Pt remains delusional and disorganized, but she is tolerating transition to zyprexa without difficulty or side effects, and we will plan to increase the dose tonight. - Continue inpatient hospitalization - Schizophrenia - Change Zyprexa '10mg'$  qhs to zyprexa '15mg'$  qhs - Continue atarax '25mg'$  q6h prn anxiety - Continue trazodone '50mg'$  qhs prn insomnia - Encourage proper hygiene and adherence to unit expectations  11/21: Pt continues to display some thought disorganization, grandiose delusions, and poor insight. She is tolerating her medications  without difficulty or side effects. We are looking into ACT team referrals.  - Continue inpatient hospitalization - Schizophrenia             - Continue celexa '5mg'$  qDay -Continuezyprexa '20mg'$  qhs - Continue atarax '25mg'$  q6h prn anxiety - Continue trazodone '50mg'$  qhs prn insomnia     RN Treatment Plan for Primary Diagnosis: Schizophrenia Long Term Goal(s): Knowledge of disease and therapeutic regimen to maintain health will improve  Short Term Goals: Ability to identify and develop effective coping behaviors will improve and Compliance with prescribed medications will improve  Medication Management: RN will administer medications as ordered by provider, will assess and evaluate patient's response and provide education to patient for prescribed medication. RN will report any adverse and/or side effects to prescribing provider.  Therapeutic Interventions: 1 on 1 counseling sessions, Psychoeducation, Medication administration, Evaluate responses to treatment, Monitor vital signs and CBGs as ordered, Perform/monitor CIWA, COWS, AIMS and Fall Risk screenings as ordered, Perform wound care treatments as ordered.  Evaluation of Outcomes: Progressing   LCSW Treatment Plan for Primary Diagnosis: Schizophrenia Long Term Goal(s): Safe transition to appropriate next level of care at discharge, Engage patient in therapeutic group addressing interpersonal concerns.  Short Term  Goals: Engage patient in aftercare planning with referrals and resources  Therapeutic Interventions: Assess for all discharge needs, 1 to 1 time with Social worker, Explore available resources and support systems, Assess for adequacy in community support network, Educate family and significant other(s) on suicide prevention, Complete Psychosocial Assessment, Interpersonal group therapy.  Evaluation of Outcomes: Not met  CSW has been unable to have any meaningful conversation with pt about d/c  planning   Progress in Treatment: Attending groups: No Participating in groups: No Taking medication as prescribed: Yes Toleration medication: Yes, no side effects reported at this time Family/Significant other contact made: No Patient understands diagnosis: No Limited insight Discussing patient identified problems/goals with staff: Yes Medical problems stabilized or resolved: Yes Denies suicidal/homicidal ideation: Yes Issues/concerns per patient self-inventory: None Other: N/A  New problem(s) identified: None identified at this time.   New Short Term/Long Term Goal(s): "I'm not sick or anything; I caught a headache from sitting down."  Discharge Plan or Barriers:   Reason for Continuation of Hospitalization: Disorganization Medication stabilization   Estimated Length of Stay: 11/20  Attendees: Patient:  07/08/2017  11:40 AM  Physician: Maris Berger, MD 07/08/2017  11:40 AM  Nursing: Sena Hitch, RN 07/08/2017  11:40 AM  RN Care Manager: Lars Pinks, RN 07/08/2017  11:40 AM  Social Worker: Ripley Fraise 07/08/2017  11:40 AM  Recreational Therapist: Winfield Cunas 07/08/2017  11:40 AM  Other: Norberto Sorenson 07/08/2017  11:40 AM  Other:  07/08/2017  11:40 AM    Scribe for Treatment Team:  Roque Lias LCSW 07/08/2017 11:40 AM

## 2017-07-09 MED ORDER — OLANZAPINE 10 MG IM SOLR
5.0000 mg | Freq: Three times a day (TID) | INTRAMUSCULAR | Status: DC | PRN
Start: 2017-07-09 — End: 2017-07-14

## 2017-07-09 MED ORDER — CITALOPRAM HYDROBROMIDE 10 MG PO TABS
10.0000 mg | ORAL_TABLET | Freq: Every day | ORAL | Status: DC
  Administered 2017-07-10 – 2017-07-11 (×2): 10 mg via ORAL
  Filled 2017-07-09 (×4): qty 1

## 2017-07-09 MED ORDER — OLANZAPINE 5 MG PO TABS: 5.0000 mg | ORAL_TABLET | Freq: Three times a day (TID) | ORAL | Status: DC | PRN

## 2017-07-09 NOTE — Progress Notes (Signed)
Did not attend group 

## 2017-07-09 NOTE — Progress Notes (Signed)
Patient ID: Collin White, male   DOB: 07/10/1994, 21 y.o.   MRN: [id number]   Nursing Progress Note 1900-0730  D) Patient presents with flat affect and is isolative to his room this evening. Patient did not attend group. Patient denies SI/HI/AVH or pain. Patient contracts for safety on the unit. Patient did get up for snacks and medications. Patient refused Trazodone this evening but did take his scheduled Zyprexa. Patient denies concerns for writer but continues to appear paranoid, cautious and is minimally interactive in the milieu.  A) Emotional support given. 1:1 interaction and active listening provided. Patient medicated as prescribed. Medications and plan of care reviewed with patient. Patient verbalized understanding without further questions. Snacks and fluids provided. Opportunities for questions or concerns presented to patient. Patient encouraged to continue to work on treatment goals. Labs, vital signs and patient behavior monitored throughout shift. Patient safety maintained with q15 min safety checks. Low fall risk precautions in place and reviewed with patient; patient verbalized understanding.  R) Patient receptive to interaction with nurse. Patient remains safe on the unit at this time. Patient denies any adverse medication reactions at this time. Patient is resting in bed without complaints. Will continue to monitor.

## 2017-07-09 NOTE — Plan of Care (Signed)
Safety Care Plan Documentation  Goal: Periods of time without injury will increase Intervention: Patient is on q15 minute safety checks and low fall risk precautions. Patient verbally contracts for safety on the unit. Outcome: Patient remains safe at this time  07/09/2017 9:40 PM - Progressing by Ferrel Loganollazo, Orren Pietsch A, RN

## 2017-07-09 NOTE — Progress Notes (Signed)
[facility]  07/09/2017 11:06 AM Collin White  MRN:  [id number]   Subjective: No acute issues overnight per discussion with staff.  Continues with isolation and general withdrawal.  Patient did interact with Clinical research associatewriter, but was fairly constricted and concrete in thought process.   She does not know where she would live or where she would go after discharge.  Stares blankly for periods of time.  Reports that she slept okay last night, denies any physical complaints.  Principal Problem: Schizophrenia ([facility]) Diagnosis:   Patient Active Problem List   Diagnosis Date Noted  . Schizophrenia ([facility]) [F20.9] 06/30/2017  . Schizophrenia, paranoid type ([facility]) [F20.0] 06/08/2017  . 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 Paranoid schizophrenia ([facility]) [F20.0] 09/29/2015  . 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 History of posttraumatic stress disorder (PTSD) [Z86.59] 09/26/2015   Total Time spent with patient: 15 minutes  Past Psychiatric History: See intake H&P for full details. Reviewed, with no updates at this time.   Past Medical History:  Past Medical History:  Diagnosis Date  . Psychoses ([facility])   . Schizophrenia ([facility])   . Seizures ([facility])     Past Surgical History:  Procedure Laterality Date  . abd surgery s/p traumatic event    . facial reconstructive surgery    . NO PAST SURGERIES  patient stated he had facial reconstruction surgery as a child   . 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 Hypertension Other   . Mental illness Neg Hx    Family Psychiatric  History: See intake H&P for full details. Reviewed, with no updates at this time.  Social History:  Social History   Substance and Sexual Activity  Alcohol Use No     Social History   Substance and Sexual Activity  Drug Use No    Social History   Socioeconomic History  .  Marital status: Single    Spouse name: None  . Number of children: None  . Years of education: None  . Highest education level: None  Social Needs  . Financial resource strain: None  . Food insecurity - worry: None  . Food insecurity - inability: None  . Transportation needs - medical: None  . Transportation needs - non-medical: None  Occupational History  . None  Tobacco Use  . Smoking status: Former Smoker    Types: Cigarettes  . Smokeless tobacco: Never Used  Substance and Sexual Activity  . Alcohol use: No  . Drug use: No  . Sexual activity: Yes    Birth control/protection: Condom  Other Topics Concern  . None  Social History Narrative   ** Merged History Encounter **       ** Merged History Encounter **       Additional Social History:                         Sleep: Good  Appetite:  Fair  Current Medications: Current Facility-Administered Medications  Medication Dose Route Frequency Provider Last Rate Last Dose  . acetaminophen (TYLENOL) tablet 650 mg  650 mg Oral Q6H PRN Kerry HoughSimon, Spencer E, PA-C   650 mg at 06/30/17 2105  . alum & mag hydroxide-simeth (MAALOX/MYLANTA) 200-200-20 MG/5ML suspension 30 mL  30 mL Oral Q4H PRN Kerry HoughSimon, Spencer E, PA-C      . [  START ON 07/10/2017] citalopram (CELEXA) tablet 10 mg  10 mg Oral Daily Burnard Leigh, MD      . hydrOXYzine (ATARAX/VISTARIL) tablet 25 mg  25 mg Oral Q6H PRN Kerry Hough, PA-C   25 mg at 07/04/17 2132  . ibuprofen (ADVIL,MOTRIN) tablet 400 mg  400 mg Oral Q6H PRN Kerry Hough, PA-C   400 mg at 07/04/17 1527  . magnesium hydroxide (MILK OF MAGNESIA) suspension 30 mL  30 mL Oral Daily PRN Donell Sievert E, PA-C      . OLANZapine (ZYPREXA) injection 5 mg  5 mg Intramuscular Q8H PRN Burnard Leigh, MD      . OLANZapine [facility]) tablet 20 mg  20 mg Oral QHS Jomarie Longs, MD   20 mg at 07/08/17 2101  . OLANZapine (ZYPREXA) tablet 5 mg  5 mg Oral Q8H PRN Burnard Leigh, MD       . traZODone (DESYREL) tablet 50 mg  50 mg Oral QHS,MR X 1 Kerry Hough, PA-C   50 mg at 07/06/17 2123    Lab Results: No results found for this or any previous visit (from the past 48 hour(s)).  Blood Alcohol level:  Lab Results  Component Value Date   ETH <10 06/26/2017   ETH <10 06/18/2017    Metabolic Disorder Labs: Lab Results  Component Value Date   HGBA1C 5.1 07/06/2017   MPG 99.67 07/06/2017   MPG 103 06/10/2017   Lab Results  Component Value Date   PROLACTIN 21.6 (H) 07/06/2017   PROLACTIN 32.0 (H) 06/10/2017   Lab Results  Component Value Date   CHOL 177 07/06/2017   TRIG 174 (H) 07/06/2017   HDL 51 07/06/2017   CHOLHDL 3.5 07/06/2017   VLDL 35 07/06/2017   LDLCALC 91 07/06/2017   LDLCALC 61 06/10/2017    Physical Findings: AIMS: Facial and Oral Movements Muscles of Facial Expression: None, normal Lips and Perioral Area: None, normal Jaw: None, normal Tongue: None, normal,Extremity Movements Upper (arms, wrists, hands, fingers): None, normal Lower (legs, knees, ankles, toes): None, normal, Trunk Movements Neck, shoulders, hips: None, normal, Overall Severity Severity of abnormal movements (highest score from questions above): None, normal Incapacitation due to abnormal movements: None, normal Patient's awareness of abnormal movements (rate only patient's report): No Awareness, Dental Status Current problems with teeth and/or dentures?: No Does patient usually wear dentures?: No  CIWA:    COWS:     Musculoskeletal: Strength & Muscle Tone: within normal limits Gait & Station: normal Patient leans: N/A  Psychiatric Specialty Exam: Physical Exam  ROS  Blood pressure 122/82, pulse 71, temperature 97.9 F (36.6 C), temperature source Oral, resp. rate 16, height 6' (1.829 m), weight 63.5 kg (140 lb).Body mass index is 18.99 kg/m.  General Appearance: Bizarre and Disheveled  Eye Contact:  Minimal  Speech:  Slow  Volume:  Decreased  Mood:   Depressed  Affect:  Flat  Thought Process:  Irrelevant  Orientation:  Full (Time, Place, and Person)  Thought Content:  Illogical  Suicidal Thoughts:  No  Homicidal Thoughts:  No  Memory:  Recent;   Fair  Judgement:  Impaired  Insight:  Lacking  Psychomotor Activity:  Decreased  Concentration:  Concentration: Poor  Recall:  Poor  Fund of Knowledge:  Fair  Language:  Poor  Akathisia:  Negative  Handed:  Right  AIMS (if indicated):     Assets:  Desire for Improvement  ADL's:  Intact  Cognition:  WNL  Sleep:  Number of Hours: 6.25    Treatment Plan Summary: 21 year old biological male who identifies as transgender male, with a history of schizophrenia, admitted with grandiosity, delusional thinking, and disorganized behavior.  Patient also has a history of borderline intellectual function, and very limited social and financial support system.  [place]. Coordinating with patient and social work regarding disposal as we titrate medications to effect and monitor for reconstitution to baseline.  We will up titrate Celexa and continue Zyprexa 20 mg nightly.  Burnard LeighAlexander Arya Eksir, MD 07/09/2017, 11:06 AM

## 2017-07-09 NOTE — Progress Notes (Signed)
Patient ID: Collin White, male   DOB: 07/10/1994, 21 y.o.   MRN: [id number]    D:Pt has been very isolative on the unit today, he remained in his room most of the day. Pt did not engage in any treatment, he did not attend any groups.  Pt reported that his depression was a 0, his hopelessness was a 0, and his anxiety was a 0. Pt reported that his goal for today was to get released.Pt took all medication as prescribed by the doctor. Pt reported being negative SI/HI, no AH/VH noted. A: 15 min checks continued for patient safety. R: Pt safety maintained.

## 2017-07-10 NOTE — [facility] Group Notes (Signed)
07/10/2017 1:15pm  Type of Therapy/Topic:  Group Therapy:  Feelings about Diagnosis  Participation Level:  Did Not Attend   Description of Group:   This group will allow patients to explore their thoughts and feelings about diagnoses they have received. Patients will be guided to explore their level of understanding and acceptance of these diagnoses. Facilitator will encourage patients to process their thoughts and feelings about the reactions of others to their diagnosis and will guide patients in identifying ways to discuss their diagnosis with significant others in their lives. This group will be process-oriented, with patients participating in exploration of their own experiences, giving and receiving support, and processing challenge from other group members.   Therapeutic Goals: 1. Patient will demonstrate understanding of diagnosis as evidenced by identifying two or more symptoms of the disorder 2. Patient will be able to express two feelings regarding the diagnosis 3. Patient will demonstrate their ability to communicate their needs through discussion and/or role play  Summary of Patient Progress:       Therapeutic Modalities:   Cognitive Behavioral Therapy Brief Therapy Feelings Identification    Collin ShearsCassandra  [name], LCSW 07/10/2017 12:14 PM

## 2017-07-10 NOTE — Plan of Care (Signed)
Pt safe on the unit at this time 

## 2017-07-10 NOTE — Progress Notes (Signed)
D: Pt denies SI/HI/AVH. Pt is pleasant and cooperative. Pt had minimal interaction on the unit this evening.   A: Pt was offered support and encouragement. Pt was given scheduled medications. Pt was encourage to attend groups. Q 15 minute checks were done for safety.   R: safety maintained on unit.

## 2017-07-10 NOTE — Progress Notes (Signed)
[facility]  07/10/2017 8:53 AM Collin White  MRN:  [id number] Subjective:   Patient seen, chart reviewed and case discussed with nursing staff.  Per staff, she appears to be less irritable today. She ate breakfast this morning.   She is interviewed in her room as she declines to come out of the room. She states that she is doing fine. She wants to be discharged, stating that she will get on a "cruise." When she is asked where she will be going, she states that she will go to [place], where she has home (although she initially states that she does not have any place to go nor her own place). 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 She denies somatic complaint, which she used to be preoccupied with. She occasionally talks with her cousin, who she may have talked this years. She has fair appetite. She denies feeling depressed today. She denies SI, HI, AH/VH.   Principal Problem: Schizophrenia (HCC) Diagnosis:   Patient Active Problem List   Diagnosis Date Noted  . Schizophrenia (HCC) [F20.9] 06/30/2017  . Schizophrenia, paranoid type (HCC) [F20.0] 06/08/2017  . 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 Paranoid schizophrenia (HCC) [F20.0] 09/29/2015  . 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 History of posttraumatic stress disorder (PTSD) [Z86.59] 09/26/2015   Total Time spent with patient: 15 minutes  Past Psychiatric History: See initial H&P for dull details. Reviewed, with no updates at this time.   Past Medical History:  Past Medical History:  Diagnosis Date  . Psychoses (HCC)   . Schizophrenia (HCC)   . Seizures (HCC)     Past Surgical History:  Procedure Laterality Date  . abd surgery s/p traumatic event    . facial reconstructive surgery    . NO PAST SURGERIES  patient stated he had facial reconstruction  surgery as a child   . 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 Hypertension Other   . Mental illness Neg Hx    Family Psychiatric  History: See initial H&P for dull details. Reviewed, with no updates at this time.  Social History:  Social History   Substance and Sexual Activity  Alcohol Use No     Social History   Substance and Sexual Activity  Drug Use No    Social History   Socioeconomic History  . Marital status: Single    Spouse name: None  . Number of children: None  . Years of education: None  . Highest education level: None  Social Needs  . Financial resource strain: None  . Food insecurity - worry: None  . Food insecurity - inability: None  . Transportation needs - medical: None  . Transportation needs - non-medical: None  Occupational History  . None  Tobacco Use  . Smoking status: Former Smoker    Types: Cigarettes  . Smokeless tobacco: Never Used  Substance and Sexual Activity  . Alcohol use: No  . Drug use: No  . Sexual activity: Yes    Birth control/protection: Condom  Other Topics Concern  . None  Social History Narrative   ** Merged History Encounter **       ** Merged History Encounter **       Additional Social History:  Sleep: Good  Appetite:  Good  Current Medications: Current Facility-Administered Medications  Medication Dose Route Frequency Provider Last Rate Last Dose  . acetaminophen (TYLENOL) tablet 650 mg  650 mg Oral Q6H PRN Kerry HoughSimon, Spencer E, PA-C   650 mg at 06/30/17 2105  . alum & mag hydroxide-simeth (MAALOX/MYLANTA) 200-200-20 MG/5ML suspension 30 mL  30 mL Oral Q4H PRN Kerry HoughSimon, Spencer E, PA-C      . citalopram (CELEXA) tablet 10 mg  10 mg Oral Daily Burnard LeighEksir, Alexander Arya, MD   10 mg at 07/10/17 0847  . hydrOXYzine (ATARAX/VISTARIL) tablet 25 mg  25 mg Oral Q6H PRN Kerry HoughSimon, Spencer E, PA-C   25 mg at 07/04/17 2132  . ibuprofen  (ADVIL,MOTRIN) tablet 400 mg  400 mg Oral Q6H PRN Kerry HoughSimon, Spencer E, PA-C   400 mg at 07/04/17 1527  . magnesium hydroxide (MILK OF MAGNESIA) suspension 30 mL  30 mL Oral Daily PRN Donell SievertSimon, Spencer E, PA-C      . OLANZapine (ZYPREXA) injection 5 mg  5 mg Intramuscular Q8H PRN Burnard LeighEksir, Alexander Arya, MD      . OLANZapine [name](ZYPREXA) tablet 20 mg  20 mg Oral QHS Jomarie LongsEappen, Saramma, MD   20 mg at 07/09/17 2115  . OLANZapine (ZYPREXA) tablet 5 mg  5 mg Oral Q8H PRN Burnard LeighEksir, Alexander Arya, MD      . traZODone (DESYREL) tablet 50 mg  50 mg Oral QHS,MR X 1 Kerry HoughSimon, Spencer E, PA-C   50 mg at 07/06/17 2123    Lab Results: No results found for this or any previous visit (from the past 48 hour(s)).  Blood Alcohol level:  Lab Results  Component Value Date   ETH <10 06/26/2017   ETH <10 06/18/2017    Metabolic Disorder Labs: Lab Results  Component Value Date   HGBA1C 5.1 07/06/2017   MPG 99.67 07/06/2017   MPG 103 06/10/2017   Lab Results  Component Value Date   PROLACTIN 21.6 (H) 07/06/2017   PROLACTIN 32.0 (H) 06/10/2017   Lab Results  Component Value Date   CHOL 177 07/06/2017   TRIG 174 (H) 07/06/2017   HDL 51 07/06/2017   CHOLHDL 3.5 07/06/2017   VLDL 35 07/06/2017   LDLCALC 91 07/06/2017   LDLCALC 61 06/10/2017    Physical Findings: AIMS: Facial and Oral Movements Muscles of Facial Expression: None, normal Lips and Perioral Area: None, normal Jaw: None, normal Tongue: None, normal,Extremity Movements Upper (arms, wrists, hands, fingers): None, normal Lower (legs, knees, ankles, toes): None, normal, Trunk Movements Neck, shoulders, hips: None, normal, Overall Severity Severity of abnormal movements (highest score from questions above): None, normal Incapacitation due to abnormal movements: None, normal Patient's awareness of abnormal movements (rate only patient's report): No Awareness, Dental Status Current problems with teeth and/or dentures?: No Does patient usually wear  dentures?: No  CIWA:    COWS:     Musculoskeletal: Strength & Muscle Tone: within normal limits Gait & Station: normal Patient leans: N/A  Psychiatric Specialty Exam: Physical Exam  Review of Systems  Psychiatric/Behavioral: Negative for depression, hallucinations, memory loss, substance abuse and suicidal ideas. The patient is nervous/anxious. The patient does not have insomnia.   All other systems reviewed and are negative.   Blood pressure 139/70, pulse (!) 107, temperature 98.4 F (36.9 C), temperature source Oral, resp. rate 20, height 6' (1.829 m), weight 140 lb (63.5 kg).Body mass index is 18.99 kg/m.  General Appearance: Disheveled  Eye Contact:  Fair  Speech:  Normal Rate  Volume:  Decreased  Mood:  "fine"  Affect:  Blunt  Thought Process:  Disorganized  Orientation:  Full (Time, Place, and Person)  Thought Content:  Illogical Perceptions: denies AH/VH  Suicidal Thoughts:  No  Homicidal Thoughts:  No  Memory:  Immediate;   Fair  Judgement:  Impaired  Insight:  Lacking  Psychomotor Activity:  Decreased  Concentration:  Concentration: Fair and Attention Span: Fair  Recall:  [employer] of Knowledge:  Fair  Language:  Good  Akathisia:  No  Handed:  Right  AIMS (if indicated):     Assets:  Desire for Improvement  ADL's:  Intact  Cognition:  WNL  Sleep:  Number of Hours: 6.75   Collin White is a 21 year old transgender male with schizophrenia, admitted under IVC with grandiosity, delusion, somatic preoccupation, disorganized behavior.   Exam is notable for disorganized thought process and slowed psychomotor activity,  although there has been improvement in her somatic preoccupation. Will continue citalopram for depression; will consider further uptitration as indicated. Will continue olanzapine for schizophrenia. Will continue to work on disposition plans.   - Continue citalopram 10 mg daily  - Continue olanzapine 20 mg qhs - Continue Trazodone 50 mg qhsprn  for insomnia   Treatment Plan Summary: Daily contact with patient to assess and evaluate symptoms and progress in treatment  Neysa Hotter, MD 07/10/2017, 8:53 AM

## 2017-07-10 NOTE — Progress Notes (Signed)
D: Pt presents with blunted affect and irritable mood. Denies SI, HI, AVh and pain. Remains isolative to her room. Minimal interactions noted with staff on as need basis (medications & food) . Pt did not attend group when prompted. Rates her depression, anxiety and hopelessness all 0/10. Pt's goal for today is "leaving".  A: All medications given as prescribed with verbal education and effects monitored. Emotional support and availability   offered to pt. Compliance towards treatment regimen encouraged. Safety checks maintained at Q 15 minutes intervals without self harm gestures or outburst thus far.  R: Pt remains compliant with medications. Denies adverse drug reactions. Tolerates all PO intake well. Remains safe on and off unit.

## 2017-07-11 MED ORDER — CITALOPRAM HYDROBROMIDE 20 MG PO TABS
20.0000 mg | ORAL_TABLET | Freq: Every day | ORAL | Status: DC
  Administered 2017-07-12 – 2017-07-14 (×3): 20 mg via ORAL
  Filled 2017-07-11 (×3): qty 1
  Filled 2017-07-11: qty 7
  Filled 2017-07-11 (×2): qty 1

## 2017-07-11 MED ORDER — OLANZAPINE 10 MG PO TABS
30.0000 mg | ORAL_TABLET | Freq: Every day | ORAL | Status: DC
  Administered 2017-07-11 – 2017-07-13 (×3): 30 mg via ORAL
  Filled 2017-07-11: qty 3
  Filled 2017-07-11: qty 21
  Filled 2017-07-11 (×4): qty 3

## 2017-07-11 NOTE — Progress Notes (Signed)
[facility] MD Progress Note  07/11/2017 1:35 PM Collin White  MRN:  [id number] Subjective:   Patient seen, chart reviewed and case discussed with nursing staff. Per nursing staff, she is isolative. She is preoccupied with being male. She masturbates many times.   She lies in the bed naked, covered by blanket. She states that she is doing fine. She mumbles "staff in the hospital" although she does not elaborate it. She states that "everything is born here" and continues to mumble. She agrees to go to group. She feels "positive" and denies feeling depressed. She denies any somatic complain. She denies SI. She denies AH, VH.    Principal Problem: Schizophrenia (HCC) Diagnosis:   Patient Active Problem List   Diagnosis Date Noted  . Schizophrenia (HCC) [F20.9] 06/30/2017  . Schizophrenia, paranoid type (HCC) [F20.0] 06/08/2017  . 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 Paranoid schizophrenia (HCC) [F20.0] 09/29/2015  . 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 History of posttraumatic stress disorder (PTSD) [Z86.59] 09/26/2015   Total Time spent with patient: 15 minutes  Past Psychiatric History: See initial H&P for dull details. Reviewed, with no updates at this time.   Past Medical History:  Past Medical History:  Diagnosis Date  . Psychoses (HCC)   . Schizophrenia (HCC)   . Seizures (HCC)     Past Surgical History:  Procedure Laterality Date  . abd surgery s/p traumatic event    . facial reconstructive surgery    . NO PAST SURGERIES  patient stated he had facial reconstruction surgery as a child   . 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 Hypertension Other   . Mental illness Neg Hx    Family Psychiatric  History: See initial H&P for dull details. Reviewed, with no updates at this time.  Social History:  Social History   Substance and  Sexual Activity  Alcohol Use No     Social History   Substance and Sexual Activity  Drug Use No    Social History   Socioeconomic History  . Marital status: Single    Spouse name: None  . Number of children: None  . Years of education: None  . Highest education level: None  Social Needs  . Financial resource strain: None  . Food insecurity - worry: None  . Food insecurity - inability: None  . Transportation needs - medical: None  . Transportation needs - non-medical: None  Occupational History  . None  Tobacco Use  . Smoking status: Former Smoker    Types: Cigarettes  . Smokeless tobacco: Never Used  Substance and Sexual Activity  . Alcohol use: No  . Drug use: No  . Sexual activity: Yes    Birth control/protection: Condom  Other Topics Concern  . None  Social History Narrative   ** Merged History Encounter **       ** Merged History Encounter **       Additional Social History:                         Sleep: Good  Appetite:  Good  Current Medications: Current Facility-Administered Medications  Medication Dose Route Frequency Provider Last Rate Last Dose  . acetaminophen (TYLENOL) tablet 650 mg  650 mg Oral Q6H PRN Kerry Hough, PA-C   650 mg at 06/30/17  2105  . alum & mag hydroxide-simeth (MAALOX/MYLANTA) 200-200-20 MG/5ML suspension 30 mL  30 mL Oral Q4H PRN Kerry HoughSimon, Spencer E, PA-C      . citalopram (CELEXA) tablet 10 mg  10 mg Oral Daily Burnard LeighEksir, Alexander Arya, MD   10 mg at 07/11/17 0920  . hydrOXYzine (ATARAX/VISTARIL) tablet 25 mg  25 mg Oral Q6H PRN Kerry HoughSimon, Spencer E, PA-C   25 mg at 07/04/17 2132  . ibuprofen (ADVIL,MOTRIN) tablet 400 mg  400 mg Oral Q6H PRN Kerry HoughSimon, Spencer E, PA-C   400 mg at 07/04/17 1527  . magnesium hydroxide (MILK OF MAGNESIA) suspension 30 mL  30 mL Oral Daily PRN Donell SievertSimon, Spencer E, PA-C      . OLANZapine (ZYPREXA) injection 5 mg  5 mg Intramuscular Q8H PRN Burnard LeighEksir, Alexander Arya, MD      . OLANZapine [facility](ZYPREXA) tablet 20  mg  20 mg Oral QHS Jomarie LongsEappen, Saramma, MD   20 mg at 07/10/17 2109  . OLANZapine (ZYPREXA) tablet 5 mg  5 mg Oral Q8H PRN Burnard LeighEksir, Alexander Arya, MD      . traZODone (DESYREL) tablet 50 mg  50 mg Oral QHS,MR X 1 Kerry HoughSimon, Spencer E, PA-C   50 mg at 07/06/17 2123    Lab Results: No results found for this or any previous visit (from the past 48 hour(s)).  Blood Alcohol level:  Lab Results  Component Value Date   ETH <10 06/26/2017   ETH <10 06/18/2017    Metabolic Disorder Labs: Lab Results  Component Value Date   HGBA1C 5.1 07/06/2017   MPG 99.67 07/06/2017   MPG 103 06/10/2017   Lab Results  Component Value Date   PROLACTIN 21.6 (H) 07/06/2017   PROLACTIN 32.0 (H) 06/10/2017   Lab Results  Component Value Date   CHOL 177 07/06/2017   TRIG 174 (H) 07/06/2017   HDL 51 07/06/2017   CHOLHDL 3.5 07/06/2017   VLDL 35 07/06/2017   LDLCALC 91 07/06/2017   LDLCALC 61 06/10/2017    Physical Findings: AIMS: Facial and Oral Movements Muscles of Facial Expression: None, normal Lips and Perioral Area: None, normal Jaw: None, normal Tongue: None, normal,Extremity Movements Upper (arms, wrists, hands, fingers): None, normal Lower (legs, knees, ankles, toes): None, normal, Trunk Movements Neck, shoulders, hips: None, normal, Overall Severity Severity of abnormal movements (highest score from questions above): None, normal Incapacitation due to abnormal movements: None, normal Patient's awareness of abnormal movements (rate only patient's report): No Awareness, Dental Status Current problems with teeth and/or dentures?: No Does patient usually wear dentures?: No  CIWA:    COWS:     Musculoskeletal: Strength & Muscle Tone: within normal limits Gait & Station: lying in the bed Patient leans: N/A  Psychiatric Specialty Exam: Physical Exam  Review of Systems  Psychiatric/Behavioral: Negative for depression, hallucinations, memory loss, substance abuse and suicidal ideas. The patient  is not nervous/anxious and does not have insomnia.   All other systems reviewed and are negative.   Blood pressure (!) 126/101, pulse 84, temperature 98 F (36.7 C), resp. rate 16, height 6' (1.829 m), weight 140 lb (63.5 kg).Body mass index is 18.99 kg/m.  General Appearance: Disheveled  Eye Contact:  Fair  Speech:  mumbles  Volume:  Decreased  Mood:  "positive"  Affect:  Blunt  Thought Process:  Disorganized  Orientation:  Full (Time, Place, and Person)  Thought Content:  Rumination  Suicidal Thoughts:  No  Homicidal Thoughts:  No  Memory:  Immediate;   Fair  Judgement:  Impaired  Insight:  Lacking  Psychomotor Activity:  Decreased  Concentration:  Concentration: Fair and Attention Span: Fair  Recall:  [employer]  Fund of Knowledge:  Fair  Language:  Poor  Akathisia:  No  Handed:  Right  AIMS (if indicated):     Assets:  Physical Health  ADL's:  Intact  Cognition:  WNL  Sleep:  Number of Hours: 6.75   Collin White is a 21 year old transgender male with schizophrenia, admitted on IVC with grandiosity, somatic preoccupation and disorganized behavior.   She has been withdrawn through the day and exam is notable for disorganized thought process slowed psychomotor activity, although there has been improvement in her somatic preoccupation.  Will uptitrate citalopram to target depression. Will uptitrate olanzapine for disorganization. If she has limited benefit from this medication, will plan to taper down to 20 mg again to avoid potential side effect. Continue to work on [employer] plans; she will greatly benefit from ACT team.   Reviewed and updated plans as below - Increase citalopram 20 mg daily  - Increase olanzapine 30 mg qhs - Continue Trazodone 50 mg qhsprn for insomnia   Treatment Plan Summary: Daily contact with patient to assess and evaluate symptoms and progress in treatment  Collin Hottereina Matika Bartell, MD 07/11/2017, 1:35 PM

## 2017-07-11 NOTE — Progress Notes (Signed)
D: Pt remains guarded & isolative to his room, observed in mesh underwear in bed for majority of this shift. Eye contact is poor / avertive. Pt denies SI, HI, AVh and pain when assessed. OOB for medications and meals. Rates his depression, hopelessness and anxiety all 0/10 on self inventory sheet. Goal for today is "leaving". Pt showered and changed clothes.  A: Treatment compliance encouraged. Medications offered as per MD's orders with verbal education and effects monitored. Support and assurance provided to pt. Safety checks maintained at Q 15 minutes intervals without self harm gestures or outburst to note thus far. R: Pt remains medication compliant, denies adverse drug reactions. Pt did not attend scheduled unit groups despite multiple prompts. Tolerates all PO intake well. Safety maintained on and off unit.

## 2017-07-11 NOTE — [facility] Group Notes (Signed)
[name] Group Notes: (Clinical Social Work)   07/11/2017      Type of Therapy:  Group Therapy   Participation Level:  Did Not Attend despite MHT prompting   Anessa Charley Grossman-Orr, LCSW 07/11/2017, 12:33 PM     

## 2017-07-12 NOTE — Progress Notes (Signed)
[facility]  07/12/2017 11:44 AM Collin White  MRN:  [id number] Subjective:   Patient seen, chart reviewed and case discussed with nursing staff. Patient stays in the room.   She lies in the room, covered by blanket. She states she is doing "fine." She is hoping to go back to [place], where she used to live. Although she states that there are some family member, she mumbles some words and is unable to elaborate it. She states that she is "not supposed to be here" when she is asked if she has any somatic concern. She denies feeling depressed. She denies SI, HI, AH, VH.   Principal Problem: Schizophrenia (HCC) Diagnosis:   Patient Active Problem List   Diagnosis Date Noted  . Schizophrenia (HCC) [F20.9] 06/30/2017  . Schizophrenia, paranoid type (HCC) [F20.0] 06/08/2017  . 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 Paranoid schizophrenia (HCC) [F20.0] 09/29/2015  . 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 History of posttraumatic stress disorder (PTSD) [Z86.59] 09/26/2015   Total Time spent with patient: 15 minutes  Past Psychiatric History: See initial H&P for dull details. Reviewed, with no updates at this time.   Past Medical History:  Past Medical History:  Diagnosis Date  . Psychoses (HCC)   . Schizophrenia (HCC)   . Seizures (HCC)     Past Surgical History:  Procedure Laterality Date  . abd surgery s/p traumatic event    . facial reconstructive surgery    . NO PAST SURGERIES  patient stated he had facial reconstruction surgery as a child   . 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 Hypertension Other   . Mental illness Neg Hx    Family Psychiatric  History: See initial H&P for dull details. Reviewed, with no updates at this time.  Social History:  Social History   Substance and Sexual Activity  Alcohol Use No      Social History   Substance and Sexual Activity  Drug Use No    Social History   Socioeconomic History  . Marital status: Single    Spouse name: None  . Number of children: None  . Years of education: None  . Highest education level: None  Social Needs  . Financial resource strain: None  . Food insecurity - worry: None  . Food insecurity - inability: None  . Transportation needs - medical: None  . Transportation needs - non-medical: None  Occupational History  . None  Tobacco Use  . Smoking status: Former Smoker    Types: Cigarettes  . Smokeless tobacco: Never Used  Substance and Sexual Activity  . Alcohol use: No  . Drug use: No  . Sexual activity: Yes    Birth control/protection: Condom  Other Topics Concern  . None  Social History Narrative   ** Merged History Encounter **       ** Merged History Encounter **       Additional Social History:                         Sleep: Good  Appetite:  Good  Current Medications: Current Facility-Administered Medications  Medication Dose Route Frequency Provider Last Rate Last Dose  . acetaminophen (TYLENOL) tablet 650 mg  650 mg Oral Q6H PRN Kerry HoughSimon, Spencer E, PA-C   650 mg at 06/30/17  2105  . alum & mag hydroxide-simeth (MAALOX/MYLANTA) 200-200-20 MG/5ML suspension 30 mL  30 mL Oral Q4H PRN Donell SievertSimon, Spencer E, PA-C      . citalopram (CELEXA) tablet 20 mg  20 mg Oral Daily Freyja Govea, MD   20 mg at 07/12/17 0745  . hydrOXYzine (ATARAX/VISTARIL) tablet 25 mg  25 mg Oral Q6H PRN Kerry HoughSimon, Spencer E, PA-C   25 mg at 07/04/17 2132  . ibuprofen (ADVIL,MOTRIN) tablet 400 mg  400 mg Oral Q6H PRN Kerry HoughSimon, Spencer E, PA-C   400 mg at 07/04/17 1527  . magnesium hydroxide (MILK OF MAGNESIA) suspension 30 mL  30 mL Oral Daily PRN Donell SievertSimon, Spencer E, PA-C      . OLANZapine (ZYPREXA) injection 5 mg  5 mg Intramuscular Q8H PRN Burnard LeighEksir, Alexander Arya, MD      . OLANZapine [name](ZYPREXA) tablet 30 mg  30 mg Oral QHS Neysa HotterHisada, Reyaan Thoma, MD   30 mg  at 07/11/17 2039  . OLANZapine (ZYPREXA) tablet 5 mg  5 mg Oral Q8H PRN Burnard LeighEksir, Alexander Arya, MD      . traZODone (DESYREL) tablet 50 mg  50 mg Oral QHS,MR X 1 Kerry HoughSimon, Spencer E, PA-C   50 mg at 07/06/17 2123    Lab Results: No results found for this or any previous visit (from the past 48 hour(s)).  Blood Alcohol level:  Lab Results  Component Value Date   ETH <10 06/26/2017   ETH <10 06/18/2017    Metabolic Disorder Labs: Lab Results  Component Value Date   HGBA1C 5.1 07/06/2017   MPG 99.67 07/06/2017   MPG 103 06/10/2017   Lab Results  Component Value Date   PROLACTIN 21.6 (H) 07/06/2017   PROLACTIN 32.0 (H) 06/10/2017   Lab Results  Component Value Date   CHOL 177 07/06/2017   TRIG 174 (H) 07/06/2017   HDL 51 07/06/2017   CHOLHDL 3.5 07/06/2017   VLDL 35 07/06/2017   LDLCALC 91 07/06/2017   LDLCALC 61 06/10/2017    Physical Findings: AIMS: Facial and Oral Movements Muscles of Facial Expression: None, normal Lips and Perioral Area: None, normal Jaw: None, normal Tongue: None, normal,Extremity Movements Upper (arms, wrists, hands, fingers): None, normal Lower (legs, knees, ankles, toes): None, normal, Trunk Movements Neck, shoulders, hips: None, normal, Overall Severity Severity of abnormal movements (highest score from questions above): None, normal Incapacitation due to abnormal movements: None, normal Patient's awareness of abnormal movements (rate only patient's report): No Awareness, Dental Status Current problems with teeth and/or dentures?: No Does patient usually wear dentures?: No  CIWA:    COWS:     Musculoskeletal: Strength & Muscle Tone: within normal limits Gait & Station: lying in the bed Patient leans: N/A  Psychiatric Specialty Exam: Physical Exam  Review of Systems  Psychiatric/Behavioral: Negative for depression, hallucinations, memory loss, substance abuse and suicidal ideas. The patient is nervous/anxious. The patient does not have  insomnia.   All other systems reviewed and are negative.   Blood pressure 126/90, pulse 91, temperature 98.4 F (36.9 C), temperature source Oral, resp. rate 16, height 6' (1.829 m), weight 140 lb (63.5 kg).Body mass index is 18.99 kg/m.  General Appearance: Disheveled  Eye Contact:  Fair  Speech:  mumbles  Volume:  Decreased  Mood:  "fine"  Affect:  Flat  Thought Process:  Disorganized  Orientation:  Full (Time, Place, and Person)  Thought Content:  Rumination Perceptions: denies AH/VH  Suicidal Thoughts:  No  Homicidal Thoughts:  No  Memory:  Immediate;   Fair  Judgement:  Poor  Insight:  Lacking  Psychomotor Activity:  Decreased  Concentration:  Concentration: Fair and Attention Span: Fair  Recall:  [employer] of Knowledge:  Fair  Language:  Fair  Akathisia:  No  Handed:  Right  AIMS (if indicated):     Assets:  Desire for Improvement  ADL's:  Intact  Cognition:  WNL  Sleep:  Number of Hours: 6.75   Collin White is a 21 year old transgender male with schizophrenia, admitted on IVC with grandiosity, somatic preoccupation and disorganized behavior.   She has been withdrawn through the day and exam is notable for disorganized thought process, some slowed psychomotor activity, although there has been improvement in somatic preoccupation. Will continue citalopram for depression, olanzapine for schizophrenia. Consider taper down olanzapine to 20 mg if she has limited benefit from higher dose. Continue to work on [employer]; she will greatly benefit from ACT team.   Reviewed and updated as below - Continue citalopram 20 mg daily  - Continue olanzapine 30 mg qhs - Continue Trazodone 50 mg qhsprn for insomnia  Treatment Plan Summary: Daily contact with patient to assess and evaluate symptoms and progress in treatment  Neysa Hotter, MD 07/12/2017, 11:44 AM

## 2017-07-12 NOTE — [facility] Group Notes (Signed)
[name] LCSW Group Therapy Note  Date/Time:  07/12/2017  11:00AM-12:00PM  Type of Therapy and Topic:  Group Therapy:  Music and Mood  Participation Level:  Did Not Attend   Description of Group: In this process group, members listened to a variety of genres of music and identified that different types of music evoke different responses.  Patients were encouraged to identify music that was soothing for them and music that was energizing for them.  Patients discussed how this knowledge can help with wellness and recovery in various ways including managing depression and anxiety as well as encouraging healthy sleep habits.    Therapeutic Goals: 1. Patients will explore the impact of different varieties of music on mood 2. Patients will verbalize the thoughts they have when listening to different types of music 3. Patients will identify music that is soothing to them as well as music that is energizing to them 4. Patients will discuss how to use this knowledge to assist in maintaining wellness and recovery 5. Patients will explore the use of music as a coping skill  Summary of Patient Progress:  Did not attend   Therapeutic Modalities: Solution Focused Brief Therapy Motivational Interviewing Activity   [name], LCSW 07/12/2017 8:31 AM

## 2017-07-12 NOTE — Progress Notes (Signed)
Adult Psychoeducational Group Note  Date:  07/12/2017 Time:  11:44 PM  Group Topic/Focus:  Wrap-Up Group:   The focus of this group is to help patients review their daily goal of treatment and discuss progress on daily workbooks.  Participation Level:  Active  Participation Quality:  Appropriate  Affect:  Appropriate  Cognitive:  Appropriate  Insight: Appropriate  Engagement in Group:  Engaged  Modes of Intervention:  Discussion  Additional Comments:  Pt stated his goal for today was to work with doctor on his discharge plan. Pt stated he did accomplished his goal today. Pt rated his day a 10. Pt stated tomorrow goal is to attend all groups held and program.  Collin FurnaceChristopher  [name] 07/12/2017, 11:44 PM

## 2017-07-12 NOTE — Progress Notes (Signed)
D. Pt somewhat guarded observed briefly in dayroom. Per pt's self inventory, pt rates his depression, hopelessness and anxiety an 8/9/7 respectively. Pt states that his most important goal today is "to feel better" by "taking my meds". Pt currently denies SI/HI and AVH and agrees to contact staff before acting on any harmful thoughts.  A. Pt compliant with meds. Labs and vital monitored R. Pt remains safe with 15 minute checks. Will continue POC.

## 2017-07-12 NOTE — Progress Notes (Signed)
Patient ID: Collin White, male   DOB: 07/10/1994, 21 y.o.   MRN: [id number]  DAR Note   Pt remained in room all evening. Pt denied any anxiety, depression, pain, HI, or SI; states, "I feel good." Pt may be interacting to some internal voices. Pt made minimal eye contact. Pt refused to participate during assessment. Pt does not look to be in any acute distress. Pt was med compliant. Support, encouragement, and safe environment provided.  15-minute safety checks continue. Pt did not attend wrap up group.

## 2017-07-13 NOTE — Progress Notes (Signed)
D-pt sts in group that he rates his day as a "10 and hes had a good day", pt doesn't interact with his peers much, but does come to the dayroom to watch tv and come to group A-pt attended tonights wrap up group and participated, pt took his pm meds R-cont to monitor for safety

## 2017-07-13 NOTE — Progress Notes (Signed)
[facility] MD Progress Note  07/13/2017 12:42 PM Collin White  MRN:  [id number] Subjective:   Collin White is a 21 y/o transgender F with preferred name of "Collin White" who was admitted with worsening symptoms of psychosis. Pt was restarted on olanzapine and celexa which she has tolerated well without difficulty or side effects. Pt has remained isolative and withdrawn. She is generally disorganized and has baseline grandiose delusions, but those symptoms have been stable for the past few days. Today upon evaluation, pt reports she is doing well overall. She reports her mood is "good" and she is sleeping well. Her appetite is good. She denies SI/HI/AH/VH. When asked about discharge plan, Collin White states that she plans to return to her hotel suite which she indicates is at the [name]. Discussed with patient about alternative options such as staying with her cousin, and pt states that she sometimes does stay there. She is unsure about her long-term plan such as remaining in [place] or going back to [place], but she will work with an ACT team while here in the area. We discussed anticipating discharge to outpatient level of care tomorrow and pt states she would like to plan for that. She had no further questions, comments, or concerns.   Principal Problem: Schizophrenia (HCC) Diagnosis:   Patient Active Problem List   Diagnosis Date Noted  . Schizophrenia (HCC) [F20.9] 06/30/2017  . Schizophrenia, paranoid type (HCC) [F20.0] 06/08/2017  . 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 Paranoid schizophrenia (HCC) [F20.0] 09/29/2015  . 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 History of posttraumatic stress disorder (PTSD) [Z86.59] 09/26/2015   Total Time spent with patient: 30 minutes  Past Psychiatric History: see H&P  Past Medical History:  Past Medical History:  Diagnosis Date  . Psychoses (HCC)   .  Schizophrenia (HCC)   . Seizures (HCC)     Past Surgical History:  Procedure Laterality Date  . abd surgery s/p traumatic event    . facial reconstructive surgery    . NO PAST SURGERIES  patient stated he had facial reconstruction surgery as a child   . 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 Hypertension Other   . Mental illness Neg Hx    Family Psychiatric  History: see H&P Social History:  Social History   Substance and Sexual Activity  Alcohol Use No     Social History   Substance and Sexual Activity  Drug Use No    Social History   Socioeconomic History  . Marital status: Single    Spouse name: None  . Number of children: None  . Years of education: None  . Highest education level: None  Social Needs  . Financial resource strain: None  . Food insecurity - worry: None  . Food insecurity - inability: None  . Transportation needs - medical: None  . Transportation needs - non-medical: None  Occupational History  . None  Tobacco Use  . Smoking status: Former Smoker    Types: Cigarettes  . Smokeless tobacco: Never Used  Substance and Sexual Activity  . Alcohol use: No  . Drug use: No  . Sexual activity: Yes    Birth control/protection: Condom  Other Topics Concern  . None  Social History Narrative   ** Merged History Encounter **       ** Merged History Encounter **  Additional Social History:                         Sleep: Fair  Appetite:  Fair  Current Medications: Current Facility-Administered Medications  Medication Dose Route Frequency Provider Last Rate Last Dose  . acetaminophen (TYLENOL) tablet 650 mg  650 mg Oral Q6H PRN Kerry HoughSimon, Spencer E, PA-C   650 mg at 06/30/17 2105  . alum & mag hydroxide-simeth (MAALOX/MYLANTA) 200-200-20 MG/5ML suspension 30 mL  30 mL Oral Q4H PRN Donell SievertSimon, Spencer E, PA-C      . citalopram (CELEXA) tablet 20 mg  20 mg Oral Daily Hisada, Reina, MD    20 mg at 07/13/17 0800  . hydrOXYzine (ATARAX/VISTARIL) tablet 25 mg  25 mg Oral Q6H PRN Kerry HoughSimon, Spencer E, PA-C   25 mg at 07/04/17 2132  . ibuprofen (ADVIL,MOTRIN) tablet 400 mg  400 mg Oral Q6H PRN Kerry HoughSimon, Spencer E, PA-C   400 mg at 07/04/17 1527  . magnesium hydroxide (MILK OF MAGNESIA) suspension 30 mL  30 mL Oral Daily PRN Donell SievertSimon, Spencer E, PA-C      . OLANZapine (ZYPREXA) injection 5 mg  5 mg Intramuscular Q8H PRN Burnard LeighEksir, Alexander Arya, MD      . OLANZapine [facility](ZYPREXA) tablet 30 mg  30 mg Oral QHS Neysa HotterHisada, Reina, MD   30 mg at 07/12/17 2110  . OLANZapine (ZYPREXA) tablet 5 mg  5 mg Oral Q8H PRN Burnard LeighEksir, Alexander Arya, MD      . traZODone (DESYREL) tablet 50 mg  50 mg Oral QHS,MR X 1 Kerry HoughSimon, Spencer E, PA-C   50 mg at 07/06/17 2123    Lab Results: No results found for this or any previous visit (from the past 48 hour(s)).  Blood Alcohol level:  Lab Results  Component Value Date   ETH <10 06/26/2017   ETH <10 06/18/2017    Metabolic Disorder Labs: Lab Results  Component Value Date   HGBA1C 5.1 07/06/2017   MPG 99.67 07/06/2017   MPG 103 06/10/2017   Lab Results  Component Value Date   PROLACTIN 21.6 (H) 07/06/2017   PROLACTIN 32.0 (H) 06/10/2017   Lab Results  Component Value Date   CHOL 177 07/06/2017   TRIG 174 (H) 07/06/2017   HDL 51 07/06/2017   CHOLHDL 3.5 07/06/2017   VLDL 35 07/06/2017   LDLCALC 91 07/06/2017   LDLCALC 61 06/10/2017    Physical Findings: AIMS: Facial and Oral Movements Muscles of Facial Expression: None, normal Lips and Perioral Area: None, normal Jaw: None, normal Tongue: None, normal,Extremity Movements Upper (arms, wrists, hands, fingers): None, normal Lower (legs, knees, ankles, toes): None, normal, Trunk Movements Neck, shoulders, hips: None, normal, Overall Severity Severity of abnormal movements (highest score from questions above): None, normal Incapacitation due to abnormal movements: None, normal Patient's awareness of abnormal  movements (rate only patient's report): No Awareness, Dental Status Current problems with teeth and/or dentures?: No Does patient usually wear dentures?: No  CIWA:    COWS:     Musculoskeletal: Strength & Muscle Tone: within normal limits Gait & Station: normal Patient leans: N/A  Psychiatric Specialty Exam: Physical Exam  Nursing note and vitals reviewed.   Review of Systems  Constitutional: Negative for chills and fever.  Respiratory: Negative for cough.   Cardiovascular: Negative for chest pain and palpitations.  Gastrointestinal: Negative for heartburn and nausea.  Psychiatric/Behavioral: Negative for depression, hallucinations and suicidal ideas.    Blood pressure 122/69, pulse 81, temperature 97.7  F (36.5 C), temperature source Oral, resp. rate 16, height 6' (1.829 m), weight 63.5 kg (140 lb).Body mass index is 18.99 kg/m.  General Appearance: Casual and Disheveled  Eye Contact:  Good  Speech:  Garbled and Slow  Volume:  Decreased  Mood:  Euthymic  Affect:  Congruent and Flat  Thought Process:  Descriptions of Associations: Loose  Orientation:  Full (Time, Place, and Person)  Thought Content:  Delusions  Suicidal Thoughts:  No  Homicidal Thoughts:  No  Memory:  Immediate;   Good Recent;   Good Remote;   Good  Judgement:  Poor  Insight:  Lacking  Psychomotor Activity:  Normal  Concentration:  Concentration: Good  Recall:  Good  Fund of Knowledge:  Good  Language:  Good  Akathisia:  No  Handed:    AIMS (if indicated):     Assets:  Communication Skills Resilience Social Support  ADL's:  Intact  Cognition:  WNL  Sleep:  Number of Hours: 6.75     Treatment Plan Summary: Daily contact with patient to assess and evaluate symptoms and progress in treatment and Medication management. Pt continues to have signs/symptoms of psychosis including grandiose delusions, flat affect, isolative/withdrawn, but she appears to be near baseline. She has been adherent to  prescribed medications and she is now in agreement to follow up with an ACT team after discharge. We are anticipating discharge to outpatient level of care tomorrow. - Continue inpatient hospitalization - Schizophrenia - Continue celexa 20mg  qDay -Continuezyprexa30mg  qhs - Continue atarax 25mg  q6h prn anxiety - Continue trazodone 50mg  qhs prn insomnia - Encourage participation in groups and the therapeutic milieu - Discharge planning will be ongoing     Micheal Likenshristopher T Precilla Purnell, MD 07/13/2017, 12:42 PM

## 2017-07-13 NOTE — Progress Notes (Signed)
Recreation Therapy Notes  Date: 07/13/17 Time: 1000 Location: [address] Dayroom  Group Topic: Coping Skills  Goal Area(s) Addresses:  Patient will be able to identify positive coping skills. Patient will be able to identify what holds them back. Patient will be able to identify benefits of using coping skills post d/c.  Intervention: Web design, pencils  Activity: Web Design.  Patients were to identify the things they feel have them struck and prevents them from progressing and write them inside of the web.  Once those things were identified, patients were to come up with at least two coping skills for each setback and write them on the outside of the web.   Education: Coping Skills, Discharge Planning.   Education Outcome: Acknowledges understanding/In group clarification offered/Needs additional education.   Clinical Observations/Feedback: Pt did not attend group.   Joanne Brander, LRT/CTRS         Jasmen Emrich A 07/13/2017 12:00 PM 

## 2017-07-13 NOTE — Progress Notes (Signed)
DAR NOTE: Patient presents with flat affect and depressed mood.  Denies pain, auditory and visual hallucinations.  Rates depression at 0, hopelessness at 0, and anxiety at 0.  Maintained on routine safety checks.  Medications given as prescribed.  Support and encouragement offered as needed.  States goal for today is "discharge."  Patient is withdrawn and isolates to his room.  No interaction with staff or peers.  Offered no complaint.

## 2017-07-14 ENCOUNTER — Emergency Department (HOSPITAL_COMMUNITY)
Admission: EM | Admit: 2017-07-14 | Discharge: 2017-07-14 | Disposition: A | Discharge status: Home / Self Care | Payer: Self-pay | Attending: Emergency Medicine | Admitting: Emergency Medicine

## 2017-07-14 ENCOUNTER — Emergency Department (HOSPITAL_COMMUNITY): Payer: Self-pay

## 2017-07-14 ENCOUNTER — Encounter (HOSPITAL_COMMUNITY): Payer: Self-pay | Admitting: Emergency Medicine

## 2017-07-14 DIAGNOSIS — R51 Headache: Secondary | ICD-10-CM | POA: Insufficient documentation

## 2017-07-14 DIAGNOSIS — W0110XA Fall on same level from slipping, tripping and stumbling with subsequent striking against unspecified object, initial encounter: Secondary | ICD-10-CM | POA: Insufficient documentation

## 2017-07-14 DIAGNOSIS — G8929 Other chronic pain: Secondary | ICD-10-CM

## 2017-07-14 DIAGNOSIS — Y929 Unspecified place or not applicable: Secondary | ICD-10-CM | POA: Insufficient documentation

## 2017-07-14 DIAGNOSIS — Z87891 Personal history of nicotine dependence: Secondary | ICD-10-CM | POA: Insufficient documentation

## 2017-07-14 DIAGNOSIS — S161XXA Strain of muscle, fascia and tendon at neck level, initial encounter: Secondary | ICD-10-CM | POA: Insufficient documentation

## 2017-07-14 DIAGNOSIS — Z79899 Other long term (current) drug therapy: Secondary | ICD-10-CM | POA: Insufficient documentation

## 2017-07-14 DIAGNOSIS — Y999 Unspecified external cause status: Secondary | ICD-10-CM | POA: Insufficient documentation

## 2017-07-14 DIAGNOSIS — Y9301 Activity, walking, marching and hiking: Secondary | ICD-10-CM | POA: Insufficient documentation

## 2017-07-14 MED ORDER — OLANZAPINE 15 MG PO TABS: 30.0000 mg | ORAL_TABLET | Freq: Every day | ORAL | 0 refills | Status: DC

## 2017-07-14 MED ORDER — IBUPROFEN 200 MG PO TABS: 400.0000 mg | ORAL_TABLET | Freq: Four times a day (QID) | ORAL | 0 refills | Status: DC | PRN

## 2017-07-14 MED ORDER — HYDROXYZINE HCL 25 MG PO TABS
25.0000 mg | ORAL_TABLET | Freq: Four times a day (QID) | ORAL | 0 refills | Status: DC | PRN
Start: 2017-07-14 — End: 2018-01-14

## 2017-07-14 MED ORDER — CITALOPRAM HYDROBROMIDE 20 MG PO TABS: 20.0000 mg | ORAL_TABLET | Freq: Every day | ORAL | 0 refills | Status: DC

## 2017-07-14 MED ORDER — NAPROXEN 375 MG PO TABS: 375.0000 mg | ORAL_TABLET | Freq: Two times a day (BID) | ORAL | 0 refills | Status: DC

## 2017-07-14 MED ORDER — TRAZODONE HCL 100 MG PO TABS
100.0000 mg | ORAL_TABLET | Freq: Every day | ORAL | Status: DC
  Filled 2017-07-14 (×2): qty 1

## 2017-07-14 MED ORDER — TRAZODONE HCL 100 MG PO TABS: ORAL_TABLET | ORAL | 0 refills | Status: DC

## 2017-07-14 NOTE — Discharge Instructions (Signed)
Return as needed for any problems.

## 2017-07-14 NOTE — ED Provider Notes (Signed)
[facility]-EMERGENCY DEPT Provider Note   CSN: [id number] Arrival date & time: 07/14/17  1744     History   Chief Complaint Chief Complaint  Patient presents with  . Neck Pain    HPI Collin White is a 21 y.o. male with hx of schizophrenia and seizures and has had multiple visits for neck pain. He presents to the ED tonight with neck pain but reports it is due to an injury.  Patient reports that he was walking and tripped and fell back. He did not hit his head or have LOC but he jerked his neck causing pain in the back of the neck. The accident happened a few hours prior to arrival to the ED.  Patient has his right arm in a sling but states that is due to a previous injury and he did not re injure that today.   HPI  Past Medical History:  Diagnosis Date  . Psychoses (HCC)   . Schizophrenia (HCC)   . Seizures [facility](HCC)     Patient Active Problem List   Diagnosis Date Noted  . Schizophrenia (HCC) 06/30/2017  . Schizophrenia, paranoid type (HCC) 06/08/2017  . 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 Psychoses (HCC)   . Paranoid schizophrenia (HCC) 09/29/2015  . Cannabis use disorder, moderate, in early remission (HCC) 09/26/2015  . History of posttraumatic stress disorder (PTSD) 09/26/2015    Past Surgical History:  Procedure Laterality Date  . abd surgery s/p traumatic event    . facial reconstructive surgery    . NO PAST SURGERIES  patient stated he had facial reconstruction surgery as a child   . tumor removed from head          Home Medications    Prior to Admission medications   Medication Sig Start Date End Date Taking? Authorizing Provider  citalopram (CELEXA) 20 MG tablet Take 1 tablet (20 mg total) by mouth daily. For depression 07/15/17   Armandina StammerNwoko, Agnes I, NP  hydrOXYzine (ATARAX/VISTARIL) 25 MG tablet Take 1 tablet (25 mg total) by mouth every 6 (six) hours as needed for anxiety. 07/14/17   Armandina StammerNwoko, Agnes I, NP  naproxen  (NAPROSYN) 375 MG tablet Take 1 tablet (375 mg total) by mouth 2 (two) times daily. 07/14/17   Janne NapoleonNeese, Meshelle Holness M, NP  OLANZapine (ZYPREXA) 15 MG tablet Take 2 tablets (30 mg total) by mouth at bedtime. For mood control 07/14/17   Armandina StammerNwoko, Agnes I, NP  traZODone (DESYREL) 100 MG tablet Take 1 tablet 100 mg by mouth at bedtime: For sleep 07/14/17   Sanjuana KavaNwoko, Agnes I, NP    Family History Family History  Problem Relation Age of Onset  . Hypertension Other   . Mental illness Neg Hx     Social History Social History   Tobacco Use  . Smoking status: Former Smoker    Types: Cigarettes  . Smokeless tobacco: Never Used  Substance Use Topics  . Alcohol use: No  . Drug use: No     Allergies   Patient has no known allergies.   Review of Systems Review of Systems  Constitutional: Negative for diaphoresis.  HENT: Negative.   Eyes: Negative for visual disturbance.  Gastrointestinal: Negative for nausea and vomiting.  Musculoskeletal: Positive for neck pain. Negative for back pain.  Skin: Negative for wound.     Physical Exam Updated Vital Signs BP (!) 136/98 (BP Location: Left Arm)   Pulse 80   Temp 98.8 F (37.1 C) (Oral)   Resp 18  SpO2 100%   Physical Exam  Constitutional: He appears well-developed and well-nourished. No distress.  HENT:  Head: Normocephalic and atraumatic.  Right Ear: Tympanic membrane normal.  Left Ear: Tympanic membrane normal.  Nose: Nose normal.  Mouth/Throat: Oropharynx is clear and moist.  Eyes: Conjunctivae and EOM are normal. Pupils are equal, round, and reactive to light.  Neck: Trachea normal. Neck supple. Spinous process tenderness and muscular tenderness present.  Cardiovascular: Normal rate and regular rhythm.  Pulmonary/Chest: Effort normal and breath sounds normal.  Abdominal: Soft. There is no tenderness.  Musculoskeletal: Normal range of motion.  Neurological: He is alert. He has normal strength. Gait normal.  Reflex Scores:      Bicep  reflexes are 2+ on the right side and 2+ on the left side.      Brachioradialis reflexes are 2+ on the right side and 2+ on the left side. Grips equal  Skin: Skin is warm and dry.  Nursing note and vitals reviewed.    ED Treatments / Results  Labs (all labs ordered are listed, but only abnormal results are displayed) Labs Reviewed - No data to display  Radiology Dg Cervical Spine Complete  Result Date: 07/14/2017 CLINICAL DATA:  Acute cervical spine/ neck pain today. No known injury. EXAM: CERVICAL SPINE - COMPLETE 4+ VIEW COMPARISON:  06/23/2017 radiographs FINDINGS: There is no evidence of cervical spine fracture or prevertebral soft tissue swelling. Alignment is normal. No other significant bone abnormalities are identified. IMPRESSION: Negative cervical spine radiographs. Electronically Signed   By: Harmon PierJeffrey  Hu M.D.   On: 07/14/2017 20:30    Procedures Procedures (including critical care time)  Medications Ordered in ED Medications - No data to display   Initial Impression / Assessment and Plan / ED Course  I have reviewed the triage vital signs and the nursing notes. 21 y.o. male with cervical strain s/p fall stable for d/c without acute finding on x-ray and normal neuro exam. Will treat with NSAIDS and return precautions discussed.   Final Clinical Impressions(s) / ED Diagnoses   Final diagnoses:  Acute strain of neck muscle, initial encounter    ED Discharge Orders        Ordered    naproxen (NAPROSYN) 375 MG tablet  2 times daily     07/14/17 2101       Kerrie Buffaloeese, Mckinzee Spirito [place], [place] 07/14/17 2119    Benjiman CorePickering, Nathan, MD 07/14/17 2329

## 2017-07-14 NOTE — Tx Team (Signed)
Interdisciplinary Treatment and Diagnostic Plan Update  07/14/2017 Time of Session: 10:14 AM  Collin White MRN: [id number]  Principal Diagnosis:  Schizophrenia  Secondary Diagnoses: Principal Problem:   Schizophrenia ([place])   Current Medications:  Current Facility-Administered Medications  Medication Dose Route Frequency Provider Last Rate Last Dose  . acetaminophen (TYLENOL) tablet 650 mg  650 mg Oral Q6H PRN Laverle Hobby, PA-C   650 mg at 06/30/17 2105  . alum & mag hydroxide-simeth (MAALOX/MYLANTA) 200-200-20 MG/5ML suspension 30 mL  30 mL Oral Q4H PRN Patriciaann Clan E, PA-C      . citalopram (CELEXA) tablet 20 mg  20 mg Oral Daily Hisada, Elie Goody, MD   20 mg at 07/14/17 0754  . hydrOXYzine (ATARAX/VISTARIL) tablet 25 mg  25 mg Oral Q6H PRN Laverle Hobby, PA-C   25 mg at 07/04/17 2132  . ibuprofen (ADVIL,MOTRIN) tablet 400 mg  400 mg Oral Q6H PRN Laverle Hobby, PA-C   400 mg at 07/04/17 1527  . magnesium hydroxide (MILK OF MAGNESIA) suspension 30 mL  30 mL Oral Daily PRN Patriciaann Clan E, PA-C      . OLANZapine (ZYPREXA) injection 5 mg  5 mg Intramuscular Q8H PRN Aundra Dubin, MD      . OLANZapine [facility]) tablet 30 mg  30 mg Oral QHS Norman Clay, MD   30 mg at 07/13/17 2159  . OLANZapine (ZYPREXA) tablet 5 mg  5 mg Oral Q8H PRN Aundra Dubin, MD      . traZODone (DESYREL) tablet 50 mg  50 mg Oral QHS,MR X 1 Laverle Hobby, PA-C   50 mg at 07/06/17 2123    PTA Medications: Medications Prior to Admission  Medication Sig Dispense Refill Last Dose  . ARIPiprazole (ABILIFY) 10 MG tablet Take 1 tablet (10 mg total) by mouth daily. For mood control (Patient not taking: Reported on 06/20/2017) 30 tablet 0 Not Taking at Unknown time  . benztropine (COGENTIN) 1 MG tablet Take 1 tablet (1 mg total) by mouth at bedtime. For prevention of drug induced tremors (Patient not taking: Reported on 06/20/2017) 30 tablet 0 Not Taking at Unknown time  . ibuprofen  (ADVIL,MOTRIN) 200 MG tablet Take 400 mg by mouth every 6 (six) hours as needed for moderate pain.   Past Month at Unknown time  . ibuprofen (ADVIL,MOTRIN) 800 MG tablet Take 1 tablet (800 mg total) 3 (three) times daily by mouth. (Patient not taking: Reported on 06/25/2017) 21 tablet 0 Not Taking at Unknown time  . lidocaine (LIDODERM) 5 % Place 1 patch daily onto the skin. Remove & Discard patch within 12 hours or as directed by MD (Patient not taking: Reported on 06/25/2017) 30 patch 0 Not Taking at Unknown time  . methocarbamol (ROBAXIN) 500 MG tablet Take 1 tablet (500 mg total) 2 (two) times daily by mouth. (Patient not taking: Reported on 06/25/2017) 20 tablet 0 Not Taking at Unknown time  . naproxen (NAPROSYN) 500 MG tablet Take 1 tablet (500 mg total) 2 (two) times daily by mouth. (Patient not taking: Reported on 06/25/2017) 30 tablet 0 Not Taking at Unknown time  . ondansetron (ZOFRAN ODT) 4 MG disintegrating tablet Take 1 tablet (4 mg total) by mouth every 8 (eight) hours as needed for nausea or vomiting. (Patient not taking: Reported on 06/21/2017) 10 tablet 0 Not Taking at Unknown time  . Oxcarbazepine (TRILEPTAL) 300 MG tablet Take 1 tablet (300 mg total) by mouth 2 (two) times daily. For mood stabilization (Patient  not taking: Reported on 06/20/2017) 60 tablet 0 Not Taking at Unknown time  . traZODone (DESYREL) 50 MG tablet Take 1 tablet (50 mg total) by mouth at bedtime. For sleep (Patient not taking: Reported on 06/20/2017) 30 tablet 0 Not Taking at Unknown time    Patient Stressors: Health problems Medication change or noncompliance  Patient Strengths: Ability for insight Average or above average intelligence General fund of knowledge  Treatment Modalities: Medication Management, Group therapy, Case management,  1 to 1 session with clinician, Psychoeducation, Recreational therapy.   Physician Treatment Plan for Primary Diagnosis: Schizophrenia Long Term Goal(s): Improvement in  symptoms so as ready for discharge  Short Term Goals: Compliance with prescribed medications will improve Ability to identify changes in lifestyle to reduce recurrence of condition will improve Ability to demonstrate self-control will improve Ability to identify and develop effective coping behaviors will improve  Medication Management: Evaluate patient's response, side effects, and tolerance of medication regimen.  Therapeutic Interventions: 1 to 1 sessions, Unit Group sessions and Medication administration.  Evaluation of Outcomes: Adequate for Discharge  Physician Treatment Plan for Secondary Diagnosis: Principal Problem:   Schizophrenia ([place])   Long Term Goal(s): Improvement in symptoms so as ready for discharge  Short Term Goals: Compliance with prescribed medications will improve Ability to identify changes in lifestyle to reduce recurrence of condition will improve Ability to demonstrate self-control will improve Ability to identify and develop effective coping behaviors will improve  Medication Management: Evaluate patient's response, side effects, and tolerance of medication regimen.  Therapeutic Interventions: 1 to 1 sessions, Unit Group sessions and Medication administration.  Evaluation of Outcomes: Adequate for Discharge   11/15: Pt remains delusional and disorganized, but she is tolerating transition to zyprexa without difficulty or side effects, and we will plan to increase the dose tonight. - Continue inpatient hospitalization - Schizophrenia - Change Zyprexa 58m qhs to zyprexa 135mqhs - Continue atarax 2529m6h prn anxiety - Continue trazodone 16m82ms prn insomnia - Encourage proper hygiene and adherence to unit expectations  11/21: Pt continues to display some thought disorganization, grandiose delusions, and poor insight. She is tolerating her medications without difficulty or side effects. We are looking into ACT team  referrals.  - Continue inpatient hospitalization - Schizophrenia             - Continue celexa 5mg 70my -Continuezyprexa 20mg 1m- Continue atarax 25mg q73mrn anxiety - Continue trazodone 16mg qh43mn insomnia     RN Treatment Plan for Primary Diagnosis: Schizophrenia Long Term Goal(s): Knowledge of disease and therapeutic regimen to maintain health will improve  Short Term Goals: Ability to identify and develop effective coping behaviors will improve and Compliance with prescribed medications will improve  Medication Management: RN will administer medications as ordered by provider, will assess and evaluate patient's response and provide education to patient for prescribed medication. RN will report any adverse and/or side effects to prescribing provider.  Therapeutic Interventions: 1 on 1 counseling sessions, Psychoeducation, Medication administration, Evaluate responses to treatment, Monitor vital signs and CBGs as ordered, Perform/monitor CIWA, COWS, AIMS and Fall Risk screenings as ordered, Perform wound care treatments as ordered.  Evaluation of Outcomes: Adequate for Discharge   LCSW Treatment Plan for Primary Diagnosis: Schizophrenia Long Term Goal(s): Safe transition to appropriate next level of care at discharge, Engage patient in therapeutic group addressing interpersonal concerns.  Short Term Goals: Engage patient in aftercare planning with referrals and resources  Therapeutic Interventions: Assess for all discharge needs,  1 to 1 time with Education officer, museum, Explore available resources and support systems, Assess for adequacy in community support network, Educate family and significant other(s) on suicide prevention, Complete Psychosocial Assessment, Interpersonal group therapy.  Evaluation of Outcomes: Met.  Return home, follow up outpt  Progress in Treatment: Attending groups: No Participating in groups: No Taking medication as  prescribed: Yes Toleration medication: Yes, no side effects reported at this time Family/Significant other contact made: No Patient understands diagnosis: No Limited insight Discussing patient identified problems/goals with staff: Yes Medical problems stabilized or resolved: Yes Denies suicidal/homicidal ideation: Yes Issues/concerns per patient self-inventory: None Other: N/A  New problem(s) identified: None identified at this time.   New Short Term/Long Term Goal(s): "I'm not sick or anything; I caught a headache from sitting down."  Discharge Plan or Barriers:   Reason for Continuation of Hospitalization: Disorganization Medication stabilization   Estimated Length of Stay: D/C today  Attendees: Patient:  07/14/2017  10:14 AM  Physician: Maris Berger, MD 07/14/2017  10:14 AM  Nursing: Sena Hitch, RN 07/14/2017  10:14 AM  RN Care Manager: Lars Pinks, RN 07/14/2017  10:14 AM  Social Worker: Ripley Fraise 07/14/2017  10:14 AM  Recreational Therapist: Winfield Cunas 07/14/2017  10:14 AM  Other: Norberto Sorenson 07/14/2017  10:14 AM  Other:  07/14/2017  10:14 AM    Scribe for Treatment Team:  Roque Lias LCSW 07/14/2017 10:14 AM

## 2017-07-14 NOTE — Progress Notes (Signed)
Recreation Therapy Notes  Date: 07/14/17 Time: 1000 Location: 500 Hall Dayroom  Group Topic: Anger Management  Goal Area(s) Addresses:  Patient will identify triggers for anger.  Patient will identify physical reaction to anger.   Patient will identify benefit of using coping skills when angry.  Behavioral Response: Minimal  Intervention: Worksheet, pencils  Activity:  Anger Thermometer.  Patients were given a worksheet with a thermometer on it.  Patients were to rank at least 6 things that get them angry from 1-10 (1being the lowest, 10 the highest).  Patients were to then identify coping skills they can use to combat their anger.   Education: Anger Management, Discharge Planning   Education Outcome: Acknowledges education/In group clarification offered/Needs additional education.   Clinical Observations/Feedback: Pt stated he gets angry when people name call and use disrespectful actions.  Pt stated his coping skills were to move away from the person and punching bricks or martial arts.   Caroll RancherMarjette Andreika Vandagriff, LRT/CTRS          Caroll RancherLindsay, Chief Walkup A 07/14/2017 11:43 AM

## 2017-07-14 NOTE — Progress Notes (Signed)
Patient ID: Collin MoMalcolm White, male   DOB: 07/10/1994, 21 y.o.   MRN: [id number]  DAR Note  Pt remained in in bed with eyes closed. Pt denied any anxiety, depression, pain, HI, or SI. Pt could be interacting to some internal voices as he was observing smiling inappropriately. Pt made minimal eye contact. Pt refused to participate during assessment. Pt does not look to be in any acute distress. Pt was med compliant. Support, encouragement, and safe environment provided.  15-minute safety checks continue. Pt did not attend wrap up group.

## 2017-07-14 NOTE — [facility] Suicide Risk Assessment (Signed)
[facility]   Principal Problem: Schizophrenia Collin White(HCC) Discharge Diagnoses:  Patient Active Problem List   Diagnosis Date Noted  . Schizophrenia (HCC) [F20.9] 06/30/2017  . Schizophrenia, paranoid type (HCC) [F20.0] 06/08/2017  . 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 Paranoid schizophrenia (HCC) [F20.0] 09/29/2015  . 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 History of posttraumatic stress disorder (PTSD) [Z86.59] 09/26/2015    Total Time spent with patient: 30 minutes  Musculoskeletal: Strength & Muscle Tone: within normal limits Gait & Station: normal Patient leans: N/A  Psychiatric Specialty Exam: Review of Systems  Constitutional: Negative for chills and fever.  Respiratory: Negative for cough.   Gastrointestinal: Negative for abdominal pain, heartburn, nausea and vomiting.  Psychiatric/Behavioral: Negative for depression, hallucinations and suicidal ideas.    Blood pressure 120/80, pulse 92, temperature 97.9 F (36.6 C), temperature source Oral, resp. rate 16, height 6' (1.829 m), weight 63.5 kg (140 lb).Body mass index is 18.99 kg/m.  General Appearance: Casual and Fairly Groomed  Eye Contact::  Good  Speech:  Clear and Coherent and Normal Rate  Volume:  Normal  Mood:  Euthymic  Affect:  Congruent and Flat  Thought Process:  Disorganized, Goal Directed and Descriptions of Associations: Loose  Orientation:  Full (Time, Place, and Person)  Thought Content:  Delusions  Suicidal Thoughts:  No  Homicidal Thoughts:  No  Memory:  Immediate;   Good Recent;   Good Remote;   Good  Judgement:  Fair  Insight:  Fair  Psychomotor Activity:  Normal  Concentration:  Fair  Recall:  FiservFair  Fund of Knowledge:Fair  Language: Fair  Akathisia:  No  Handed:    AIMS (if indicated):     Assets:  Manufacturing systems engineerCommunication Skills Physical Health Resilience Social Support  Sleep:   Number of Hours: 6.25  Cognition: WNL  ADL's:  Intact   Mental Status Per Nursing Assessment::   On Admission:     Demographic Factors:  Gay, lesbian, or bisexual orientation, Low socioeconomic status, Living alone and Unemployed  Loss Factors: Financial problems/change in socioeconomic status  Historical Factors: Family history of mental illness or substance abuse and Impulsivity  Risk Reduction Factors:   NA  Continued Clinical Symptoms:  Schizophrenia:   Paranoid or undifferentiated type  Cognitive Features That Contribute To Risk:  None    Suicide Risk:  Minimal: No identifiable suicidal ideation.  Patients presenting with no risk factors but with morbid ruminations; may be classified as minimal risk based on the severity of the depressive symptoms  Follow-up Information    [name], [name] Follow up on 07/22/2017.   Why:  Wednesday at [employer]10AM  Contact information: [address] [place] [address] [phone]        [facility] Follow up.   Why:  Contact Collin White when you get out of the [facility].  she can give you a ride to your appointment at [name]  [phone]. Contact information: [address] [place] [address] [phone]         Subjective Data: Collin White is a 21 y/o transgender F with preferred name of "Collin White" who was admitted with worsening symptoms of psychosis. Pt was restarted on olanzapine and celexa which she has tolerated well without difficulty or side effects. Pt has been mostly isolative and withdrawn, but she has participated in some groups and she has been adherent to the prescribed  treatment plan. She has some baseline disorganization and grandiose delusions, and those symptoms have remained stable over the past few days of this admission. Today upon evaluation, pt is calm and cooperative. She has no specific concerns. She reports her mood is "good." She is sleeping well and her appetite is good. She  denies SI/HI/AH/VH. She is in agreement to referral to ACT team and [facility], but she still has poor insight about her delusions that she is staying in a luxury hotel suite. Pt also states she has multiple houses, but she also makes statement that she could stay with her cousin. Pt is in agreement to continue her current treatment regimen outside of the [facility]. She was able to engage in safety planning including [facility] or contact emergency services if she feels unable to maintain her own safety. Pt had no further questions, comments, or concerns.  Plan Of Care/Follow-up recommendations:  - Discharge to outpatient level of care (Pt will follow up with ACT team and [facility] transition team) - Schizophrenia - Continue celexa 20mg  qDay -Continuezyprexa30mg  qhs - Continue atarax 25mg  q6h prn anxiety - Continue trazodone 50mg  qhs prn insomnia    Activity:  as tolerated Diet:  normal Tests:  NA Other:  see above for DC plan  Collin Likenshristopher T Reyansh Kushnir, MD 07/14/2017, 10:54 AM

## 2017-07-14 NOTE — Progress Notes (Signed)
  [facility] :  Will you be returning to the same living situation after discharge:  Yes,  home At discharge, do you have transportation home?: Yes,  bus pass Do you have the ability to pay for your medications: Yes,  mental health  Release of information consent forms completed and in the chart;  Patient's signature needed at discharge.  Patient to Follow up at: Follow-up Information    [name], [name] Follow up on 07/22/2017.   Why:  Wednesday at [employer]10AM  Contact information: [address] [place] [address] [phone]        [name] Follow up.   Why:  [name] when you get out of the hospital.  she can give you a ride to your appointment at [name]  [phone]. Contact information: [address] [place] [address] [phone]           Next level of care provider [facility] Link:no  Safety Planning and Suicide Prevention discussed: Yes,  yes  Have you used any form of tobacco in the last 30 days? (Cigarettes, Smokeless Tobacco, Cigars, and/or Pipes): No  Has patient been referred to the Quitline?: N/A patient is not a smoker  Patient has been referred for addiction treatment: Yes  Ida RogueRodney B Adiya Selmer, LCSW 07/14/2017, 11:43 AM

## 2017-07-14 NOTE — ED Notes (Signed)
Unable to sign. Signature pad not working.

## 2017-07-14 NOTE — ED Provider Notes (Signed)
[facility]-EMERGENCY DEPT Provider Note   CSN: [id number] Arrival date & time: 07/14/17  2211     History   Chief Complaint Chief Complaint  Patient presents with  . Headache    HPI Kerrin MoMalcolm Summerhill is a 21 y.o. male.  HPI Patient with headache. Just discharged from the ER around 15 minutes prior to this arrival.  Unclear if he actually did leave the waiting room.  States he has had a headache for 2 weeks.  Dull and on the left side of his face.  He did recently have a fall.  However the headache preceded the fall.  Had x-ray done earlier today.  This is the patient's 40th visit in the last 6 months.  No numbness or weakness.  Does have a schizophrenia history. Past Medical History:  Diagnosis Date  . Psychoses (HCC)   . Schizophrenia (HCC)   . Seizures [facility](HCC)     Patient Active Problem List   Diagnosis Date Noted  . Schizophrenia (HCC) 06/30/2017  . Schizophrenia, paranoid type (HCC) 06/08/2017  . 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 Psychoses (HCC)   . Paranoid schizophrenia (HCC) 09/29/2015  . Cannabis use disorder, moderate, in early remission (HCC) 09/26/2015  . History of posttraumatic stress disorder (PTSD) 09/26/2015    Past Surgical History:  Procedure Laterality Date  . abd surgery s/p traumatic event    . facial reconstructive surgery    . NO PAST SURGERIES  patient stated he had facial reconstruction surgery as a child   . tumor removed from head          Home Medications    Prior to Admission medications   Medication Sig Start Date End Date Taking? Authorizing Provider  citalopram (CELEXA) 20 MG tablet Take 1 tablet (20 mg total) by mouth daily. For depression 07/15/17   Armandina StammerNwoko, Agnes I, NP  hydrOXYzine (ATARAX/VISTARIL) 25 MG tablet Take 1 tablet (25 mg total) by mouth every 6 (six) hours as needed for anxiety. 07/14/17   Armandina StammerNwoko, Agnes I, NP  naproxen (NAPROSYN) 375 MG tablet Take 1 tablet (375 mg total) by mouth 2  (two) times daily. 07/14/17   Janne NapoleonNeese, Hope M, NP  OLANZapine (ZYPREXA) 15 MG tablet Take 2 tablets (30 mg total) by mouth at bedtime. For mood control 07/14/17   Armandina StammerNwoko, Agnes I, NP  traZODone (DESYREL) 100 MG tablet Take 1 tablet 100 mg by mouth at bedtime: For sleep 07/14/17   Sanjuana KavaNwoko, Agnes I, NP    Family History Family History  Problem Relation Age of Onset  . Hypertension Other   . Mental illness Neg Hx     Social History Social History   Tobacco Use  . Smoking status: Former Smoker    Types: Cigarettes  . Smokeless tobacco: Never Used  Substance Use Topics  . Alcohol use: No  . Drug use: No     Allergies   Patient has no known allergies.   Review of Systems Review of Systems  Constitutional: Negative for chills and fever.  Eyes: Negative for visual disturbance.  Musculoskeletal: Positive for neck pain.  Neurological: Positive for headaches.  Psychiatric/Behavioral: Negative for hallucinations.     Physical Exam Updated Vital Signs BP (!) 138/94 (BP Location: Right Arm)   Pulse 78   Temp 98.1 F (36.7 C) (Oral)   Resp 18   SpO2 100%   Physical Exam  Constitutional: He is oriented to person, place, and time. He appears well-developed.  HENT:  Head: Atraumatic.  Eyes: EOM are normal. Pupils are equal, round, and reactive to light.  Neck: Neck supple.  Cardiovascular: Normal rate.  Pulmonary/Chest: Effort normal.  Neurological: He is alert and oriented to person, place, and time. He has normal strength.  Skin: Skin is warm.  Psychiatric:  Somewhat strange affect     ED Treatments / Results  Labs (all labs ordered are listed, but only abnormal results are displayed) Labs Reviewed - No data to display  EKG  EKG Interpretation None       Radiology Dg Cervical Spine Complete  Result Date: 07/14/2017 CLINICAL DATA:  Acute cervical spine/ neck pain today. No known injury. EXAM: CERVICAL SPINE - COMPLETE 4+ VIEW COMPARISON:  06/23/2017  radiographs FINDINGS: There is no evidence of cervical spine fracture or prevertebral soft tissue swelling. Alignment is normal. No other significant bone abnormalities are identified. IMPRESSION: Negative cervical spine radiographs. Electronically Signed   By: Harmon PierJeffrey  Hu M.D.   On: 07/14/2017 20:30    Procedures Procedures (including critical care time)  Medications Ordered in ED Medications - No data to display   Initial Impression / Assessment and Plan / ED Course  I have reviewed the triage vital signs and the nursing notes.  Pertinent labs & imaging results that were available during my care of the patient were reviewed by me and considered in my medical decision making (see chart for details).     Patient with acute on chronic headache.  Doubt severe intracranial pathology.  Recent x-ray.  Discharge home.  Final Clinical Impressions(s) / ED Diagnoses   Final diagnoses:  Chronic nonintractable headache, unspecified headache type    ED Discharge Orders    None      Benjiman CorePickering, Milika Ventress, MD 07/14/17 2308

## 2017-07-14 NOTE — ED Triage Notes (Signed)
Patient here from home with complaints of headache that started ago. Recently discharged, sat out in lobby and decided to check back in. Reports that he is cold.

## 2017-07-14 NOTE — ED Triage Notes (Signed)
Patient here from home with complaints of neck pain that started this morning. Increased on the left side. Ambulatory. Denies trauma.

## 2017-07-14 NOTE — Progress Notes (Signed)
Recreation Therapy Notes  INPATIENT RECREATION TR PLAN  Patient Details Name: Collin White MRN: [id number] DOB: 07/10/1994 Today's Date: 07/14/2017  Rec Therapy Plan Is patient appropriate for Therapeutic Recreation?: Yes Treatment times per week: about 3 days Estimated Length of Stay: 5-7 days TR Treatment/Interventions: Group participation (Comment)  Discharge Criteria Pt will be discharged from therapy if:: Discharged Treatment plan/goals/alternatives discussed and agreed upon by:: Patient/family  Discharge Summary Short term goals set: Pt will attend and participate in recreation therapy groups. Short term goals met: Adequate for discharge Progress toward goals comments: Groups attended Which groups?: Anger management Reason goals not met: None Therapeutic equipment acquired: N/A Reason patient discharged from therapy: Discharge from hospital Pt/family agrees with progress & goals achieved: Yes Date patient discharged from therapy: 07/14/17   Victorino Sparrow, LRT/CTRS  Ria Comment, Chella Chapdelaine A 07/14/2017, 12:51 PM

## 2017-07-14 NOTE — Discharge Summary (Signed)
Physician Discharge Summary Note  Patient:  Collin White is an 21 y.o., male MRN:  [id number] DOB:  07/10/1994 Patient phone:  [phone] (home)   Patient address:   [address] [place] [address],   Total Time spent with patient: Greater than 30 minutes  Date of Admission:  06/29/2017 Date of Discharge: 07-14-17  Reason for Admission: Worsening psychosis.  Principal Problem: Schizophrenia [facility])  Discharge Diagnoses: Patient Active Problem List   Diagnosis Date Noted  . Schizophrenia (HCC) [F20.9] 06/30/2017  . Schizophrenia, paranoid type (HCC) [F20.0] 06/08/2017  . 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 Paranoid schizophrenia (HCC) [F20.0] 09/29/2015  . 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 History of posttraumatic stress disorder (PTSD) [Z86.59] 09/26/2015   Past Psychiatric History: Schizophrenia, paranoia - type, Cannabis use disorder.  Past Medical History:  Past Medical History:  Diagnosis Date  . Psychoses (HCC)   . Schizophrenia (HCC)   . Seizures (HCC)     Past Surgical History:  Procedure Laterality Date  . abd surgery s/p traumatic event    . facial reconstructive surgery    . NO PAST SURGERIES  patient stated he had facial reconstruction surgery as a child   . 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 Hypertension Other   . Mental illness Neg Hx    Family Psychiatric  History: See H&P  Social History:  Social History   Substance and Sexual Activity  Alcohol Use No     Social History   Substance and Sexual Activity  Drug Use No    Social History   Socioeconomic History  . Marital status: Single    Spouse name: None  . Number of children: None  . Years of education: None  . Highest education level: None  Social Needs  . Financial resource strain: None  . Food insecurity -  worry: None  . Food insecurity - inability: None  . Transportation needs - medical: None  . Transportation needs - non-medical: None  Occupational History  . None  Tobacco Use  . Smoking status: Former Smoker    Types: Cigarettes  . Smokeless tobacco: Never Used  Substance and Sexual Activity  . Alcohol use: No  . Drug use: No  . Sexual activity: Yes    Birth control/protection: Condom  Other Topics Concern  . None  Social History Narrative   ** Merged History Encounter **       ** Merged History Encounter **       Hospital Course: Collin White is a 21 y/o transgender male to male with preferred name of "Collin White" who was admitted on IVC with worsening symptoms of psychosis after initial presentation to ED with complaint of pain in her "glands." Pt has recent relevant history of discharge from [facility] on 06/17/17 with plan for outpatient follow up. Today upon interview, pt shares her reasons for presentation, stating, "I wasn't even trying to come here; I went in because of a problem with my glands, but they are feeling better, and I don't know why I'm here now." Pt denies SI/HI/AH/VH. She endorses significant grandiose delusional content that she owns multiple houses, similar to her past presentation. 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 She denies symptoms of mania, OCD, and PTSD. She reports that she has been sleeping well and her appetite is good. RN staff observed the patient had defecated outside of the toilet and pt was redirected about what the expectations were while on the unit.  [facility](aka Collin White) [facility] symptoms of Schizophrenia & crisis  management due to worsening psychosis. He was recently discharged from this hospital with an outpatient psychiatric follow-up appointment scheduled for him. He re-presented to the hospital complaining of pain to his gland. He remained a poor historian, delusional & grandiose. It is unsure if he was complaint to his treatment regimen after the last hospital discharge. Collin PapaKeylay was readmitted for mood stabilization treatments.   After his re-admission assessment, his presenting symptoms were identified. The medication regimen targeting those symptoms were initiated. He was medicated & discharged on; Olanzapine 15 mg for mood control, Citalopram 20 mg for depression, Hydroxyzine 25 mg prn for anxiety & Trazodone 100 mg for insomnia.  He presented no other significant pre-existing medical problems that required treatment or monitoring. He tolerated his treatment regimen without any adverse effects reported.  During his current stay in this hospital, Collin White's improvement was monitored & noted by observation of his daily reports of symptom reduction noted. His emotional & mental status were also monitored by daily self-inventory assessment reports completed by him & the clinical staff. He was evaluated by the treatment team for mood stability & plans for continued recovery after discharge. He was recommended for further treatment option upon discharge by referring & scheduling him an outpatient psychiatric clinic for follow-up visits & medication managment as listed below. He was provided with all the necessary information needed to make these appointments without problems.    Upon discharge, Collin White was both mentally and medically stable denying SIHI, auditory/visual/tactile hallucinations, delusional thoughts & or paranoia. He was provided with a 7 days worth supply samples of his [name] discharge medications. He left [name] in no apparent distress with all personal belongings. Transportation per city bus. [name] assisted  with bus pass.  Physical Findings: AIMS: Facial and Oral Movements Muscles of Facial Expression: None, normal Lips and Perioral Area: None, normal Jaw: None, normal Tongue: None, normal,Extremity Movements Upper (arms, wrists, hands, fingers): None, normal Lower (legs, knees, ankles, toes): None, normal, Trunk Movements Neck, shoulders, hips: None, normal, Overall Severity Severity of abnormal movements (highest score from questions above): None, normal Incapacitation due to abnormal movements: None, normal Patient's awareness of abnormal movements (rate only patient's report): No Awareness, Dental Status Current problems with teeth and/or dentures?: No Does patient usually wear dentures?: No  CIWA:    COWS:     Musculoskeletal: Strength & Muscle Tone: within normal limits Gait & Station: normal Patient leans: N/A  Psychiatric Specialty Exam: Physical Exam  Constitutional: He appears well-developed.  HENT:  Head: Normocephalic.  Eyes: Pupils are equal, round, and reactive to light.  Neck: Normal range of motion.  Cardiovascular: Normal rate.  Respiratory: Effort normal.  GI: Soft.  Genitourinary:  Genitourinary Comments: Deferred  Musculoskeletal: Normal range of motion.  Neurological: He is alert.  Skin: Skin is warm.    Review of Systems  Constitutional: Negative.   HENT: Negative.   Eyes: Negative.  Respiratory: Negative.   Cardiovascular: Negative.   Gastrointestinal: Negative.   Genitourinary: Negative.   Musculoskeletal: Negative.   Skin: Negative.   Neurological: Negative.   Endo/Heme/Allergies: Negative.   Psychiatric/Behavioral: Positive for depression (Stabilized with medication prior to discharge) and hallucinations (Hx. Psychosis, stabilized with medication prior to discharge.). Negative for memory loss, substance abuse and suicidal ideas. The patient has insomnia (Stabilized with medication prior to discharge). The patient is not nervous/anxious.      Blood pressure 120/80, pulse 92, temperature 97.9 F (36.6 C), temperature source Oral, resp. rate 16, height 6' (1.829 m), weight 63.5 kg (140 lb).Body mass index is 18.99 kg/m.  See Md's SRA   Have you used any form of tobacco in the last 30 days? (Cigarettes, Smokeless Tobacco, Cigars, and/or Pipes): No  Has this patient used any form of tobacco in the last 30 days? (Cigarettes, Smokeless Tobacco, Cigars, and/or Pipes): N/A  Blood Alcohol level:  Lab Results  Component Value Date   ETH <10 06/26/2017   ETH <10 06/18/2017   Metabolic Disorder Labs:  Lab Results  Component Value Date   HGBA1C 5.1 07/06/2017   MPG 99.67 07/06/2017   MPG 103 06/10/2017   Lab Results  Component Value Date   PROLACTIN 21.6 (H) 07/06/2017   PROLACTIN 32.0 (H) 06/10/2017   Lab Results  Component Value Date   CHOL 177 07/06/2017   TRIG 174 (H) 07/06/2017   HDL 51 07/06/2017   CHOLHDL 3.5 07/06/2017   VLDL 35 07/06/2017   LDLCALC 91 07/06/2017   LDLCALC 61 06/10/2017   See Psychiatric Specialty Exam and Suicide Risk Assessment completed by Attending Physician prior to discharge.  Discharge destination:  Home  Is patient on multiple antipsychotic therapies at discharge:  No   Has Patient had three or more failed trials of antipsychotic monotherapy by history:  No  Recommended Plan for Multiple Antipsychotic Therapies: NA  Allergies as of 07/14/2017   No Known Allergies     Medication List    STOP taking these medications   ARIPiprazole 10 MG tablet Commonly known as:  ABILIFY   benztropine 1 MG tablet Commonly known as:  COGENTIN   lidocaine 5 % Commonly known as:  LIDODERM   methocarbamol 500 MG tablet Commonly known as:  ROBAXIN   naproxen 500 MG tablet Commonly known as:  NAPROSYN   ondansetron 4 MG disintegrating tablet Commonly known as:  ZOFRAN ODT   Oxcarbazepine 300 MG tablet Commonly known as:  TRILEPTAL     TAKE these medications     Indication   citalopram 20 MG tablet Commonly known as:  CELEXA Take 1 tablet (20 mg total) by mouth daily. For depression Start taking on:  07/15/2017  Indication:  Depression   hydrOXYzine 25 MG tablet Commonly known as:  ATARAX/VISTARIL Take 1 tablet (25 mg total) by mouth every 6 (six) hours as needed for anxiety.  Indication:  Feeling Anxious   ibuprofen 200 MG tablet Commonly known as:  ADVIL,MOTRIN Take 2 tablets (400 mg total) by mouth every 6 (six) hours as needed for moderate pain. What changed:  Another medication with the same name was removed. Continue taking this medication, and follow the directions you see here.  Indication:  Mild to Moderate Pain   OLANZapine 15 MG tablet Commonly known as:  ZYPREXA Take 2 tablets (30 mg total) by mouth at bedtime. For mood control  Indication:  Mood control   traZODone 100 MG tablet Commonly known as:  DESYREL  Take 1 tablet 100 mg by mouth at bedtime: For sleep What changed:    medication strength  how much to take  how to take this  when to take this  additional instructions  Indication:  Trouble Sleeping      Follow-up Information    [name] Follow up on 07/22/2017.   Why:  Wednesday at [employer] information: [address] [place] [address] [phone]        [name] Follow up.   Why:  [name] when you get out of the hospital.  she can give you a ride to your appointment at [name]  [phone]. Contact information: [address] [place] [address] [phone]          Follow-up recommendations: Activity:  As tolerated Diet: As recommended by your primary care doctor. Keep all scheduled follow-up appointments as recommended.   Comments: Patient is instructed prior to discharge to: Take all medications as prescribed by his/her mental healthcare provider. Report any adverse effects and or reactions from the medicines to his/her outpatient  provider promptly. Patient has been instructed & cautioned: To not engage in alcohol and or illegal drug use while on prescription medicines. In the event of worsening symptoms, patient is instructed to call the crisis hotline, 911 and or go to the nearest ED for appropriate evaluation and treatment of symptoms. To follow-up with his/her primary care provider for your other medical issues, concerns and or health care needs.   Signed: Sanjuana Kava, NP, PMHNP, FNP-BC 07/14/2017, 10:50 AM   Patient seen, Suicide Assessment Completed.  Disposition Plan Reviewed   Wilkin Lippy is a 21 y/o transgender F with preferred name of "Collin White" who was admitted with worsening symptoms of psychosis. Pt was restarted on olanzapine and celexa which she has tolerated well without difficulty or side effects. Pt has been mostly isolative and withdrawn, but she has participated in some groups and she has been adherent to the prescribed treatment plan. She has some baseline disorganization and grandiose delusions, and those symptoms have remained stable over the past few days of this admission. Today upon evaluation, pt is calm and cooperative. She has no specific concerns. She reports her mood is "good." She is sleeping well and her appetite is good. She denies SI/HI/AH/VH. She is in agreement to referral to ACT team and to the [facility] transition team, but she still has poor insight about her delusions that she is staying in a luxury hotel suite. Pt also states she has multiple houses, but she also makes statement that she could stay with her cousin. Pt is in agreement to continue her current treatment regimen outside of the hospital. She was able to engage in safety planning including plan to return to [facility] or contact emergency services if she feels unable to maintain her own safety. Pt had no further questions, comments, or concerns.  Plan Of Care/Follow-up recommendations:  - Discharge to outpatient level of care (Pt  will follow up with ACT team and [name] transition team) - Schizophrenia - Continue celexa 20mg  qDay -Continuezyprexa30mg  qhs - Continue atarax 25mg  q6h prn anxiety - Continue trazodone 50mg  qhs prn insomnia    Activity:  as tolerated Diet:  normal Tests:  NA Other:  see above for DC plan  Micheal Likens, MD

## 2017-07-14 NOTE — ED Notes (Signed)
Bed: [facility]4 Expected date:  Expected time:  Means of arrival:  Comments: 

## 2017-07-14 NOTE — Progress Notes (Signed)
Patient discharged to lobby. Patient was stable and appreciative at that time. All papers, samples and prescriptions were given and valuables returned. Verbal understanding expressed. Denies SI/HI and A/VH. Pt given opportunity to express concerns and ask questions.

## 2017-07-15 ENCOUNTER — Encounter (HOSPITAL_COMMUNITY): Payer: Self-pay | Admitting: Family Medicine

## 2017-07-15 DIAGNOSIS — Z87891 Personal history of nicotine dependence: Secondary | ICD-10-CM | POA: Insufficient documentation

## 2017-07-15 DIAGNOSIS — R197 Diarrhea, unspecified: Secondary | ICD-10-CM | POA: Insufficient documentation

## 2017-07-15 DIAGNOSIS — R112 Nausea with vomiting, unspecified: Secondary | ICD-10-CM | POA: Insufficient documentation

## 2017-07-15 DIAGNOSIS — R4183 Borderline intellectual functioning: Secondary | ICD-10-CM | POA: Insufficient documentation

## 2017-07-15 DIAGNOSIS — Z79899 Other long term (current) drug therapy: Secondary | ICD-10-CM | POA: Insufficient documentation

## 2017-07-15 NOTE — ED Triage Notes (Signed)
[facility] from [facility] [facility] Patient is complaining of right sided abd pain, nausea, and watery stool. Pain is described as pressure and sharp. Patient sitting up in wheelchair in no acute distress.

## 2017-07-16 ENCOUNTER — Emergency Department (HOSPITAL_COMMUNITY)
Admission: EM | Admit: 2017-07-16 | Discharge: 2017-07-16 | Disposition: A | Discharge status: Home / Self Care | Payer: Self-pay | Attending: Emergency Medicine | Admitting: Emergency Medicine

## 2017-07-16 DIAGNOSIS — R112 Nausea with vomiting, unspecified: Secondary | ICD-10-CM

## 2017-07-16 DIAGNOSIS — R197 Diarrhea, unspecified: Secondary | ICD-10-CM

## 2017-07-16 MED ORDER — ONDANSETRON 4 MG PO TBDP: 4.0000 mg | ORAL_TABLET | Freq: Three times a day (TID) | ORAL | 0 refills | Status: DC | PRN

## 2017-07-16 MED ORDER — ONDANSETRON 8 MG PO TBDP
8.0000 mg | ORAL_TABLET | Freq: Once | ORAL | Status: AC
  Administered 2017-07-16: 8 mg via ORAL
  Filled 2017-07-16: qty 1

## 2017-07-16 NOTE — ED Provider Notes (Signed)
[facility]-EMERGENCY DEPT Provider Note   CSN: [id number] Arrival date & time: 07/15/17  1919     History   Chief Complaint Chief Complaint  Patient presents [id number]  . Abdominal Pain    HPI Kerrin MoMalcolm Troxler is a 21 y.o. male.  Patient presents to the emergency department with chief complaint of nausea, vomiting, diarrhea. He states that the symptoms started this morning.  He also complains of some crampy abdominal pain.  He has not taken anything for his symptoms.  He denies any fevers or chills.  There are no modifying factors.   The history is provided by the patient. No language interpreter was used.    Past Medical History:  Diagnosis Date  . Psychoses (HCC)   . Schizophrenia (HCC)   . Seizures [facility](HCC)     Patient Active Problem List   Diagnosis Date Noted  . Schizophrenia (HCC) 06/30/2017  . Schizophrenia, paranoid type (HCC) 06/08/2017  . 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 Psychoses (HCC)   . Paranoid schizophrenia (HCC) 09/29/2015  . Cannabis use disorder, moderate, in early remission (HCC) 09/26/2015  . History of posttraumatic stress disorder (PTSD) 09/26/2015    Past Surgical History:  Procedure Laterality Date  . abd surgery s/p traumatic event    . facial reconstructive surgery    . NO PAST SURGERIES  patient stated he had facial reconstruction surgery as a child   . tumor removed from head          Home Medications    Prior to Admission medications   Medication Sig Start Date End Date Taking? Authorizing Provider  ibuprofen (ADVIL,MOTRIN) 200 MG tablet Take 400 mg by mouth every 6 (six) hours as needed for moderate pain.   Yes [provider]  citalopram (CELEXA) 20 MG tablet Take 1 tablet (20 mg total) by mouth daily. For depression 07/15/17   Armandina StammerNwoko, Agnes I, NP  hydrOXYzine (ATARAX/VISTARIL) 25 MG tablet Take 1 tablet (25 mg total) by mouth every 6 (six) hours as needed for anxiety. 07/14/17   Armandina StammerNwoko,  Agnes I, NP  naproxen (NAPROSYN) 375 MG tablet Take 1 tablet (375 mg total) by mouth 2 (two) times daily. 07/14/17   Janne NapoleonNeese, Hope M, NP  OLANZapine (ZYPREXA) 15 MG tablet Take 2 tablets (30 mg total) by mouth at bedtime. For mood control 07/14/17   Armandina StammerNwoko, Agnes I, NP  traZODone (DESYREL) 100 MG tablet Take 1 tablet 100 mg by mouth at bedtime: For sleep 07/14/17   Sanjuana KavaNwoko, Agnes I, NP    Family History Family History  Problem Relation Age of Onset  . Hypertension Other   . Mental illness Neg Hx     Social History Social History   Tobacco Use  . Smoking status: Former Smoker    Types: Cigarettes  . Smokeless tobacco: Never Used  Substance Use Topics  . Alcohol use: No  . Drug use: No     Allergies   Patient has no known allergies.   Review of Systems Review of Systems  All other systems reviewed and are negative.    Physical Exam Updated Vital Signs BP 139/84 (BP Location: Left Arm)   Pulse 71   Temp (!) 97.5 F (36.4 C) (Oral)   Resp 20   SpO2 100%   Physical Exam  Constitutional: He is oriented to person, place, and time. He appears well-developed and well-nourished.  HENT:  Head: Normocephalic and atraumatic.  Eyes: Conjunctivae and EOM are normal. Pupils are equal, round,  and reactive to light. Right eye exhibits no discharge. Left eye exhibits no discharge. No scleral icterus.  Neck: Normal range of motion. Neck supple. No JVD present.  Cardiovascular: Normal rate, regular rhythm and normal heart sounds. Exam reveals no gallop and no friction rub.  No murmur heard. Pulmonary/Chest: Effort normal and breath sounds normal. No respiratory distress. He has no wheezes. He has no rales. He exhibits no tenderness.  Abdominal: Soft. He exhibits no distension and no mass. There is no tenderness. There is no rebound and no guarding.  No focal abdominal tenderness, no RLQ tenderness or pain at McBurney's point, no RUQ tenderness or Murphy's sign, no left-sided abdominal  tenderness, no fluid wave, or signs of peritonitis   Musculoskeletal: Normal range of motion. He exhibits no edema or tenderness.  Neurological: He is alert and oriented to person, place, and time.  Skin: Skin is warm and dry.  Psychiatric: He has a normal mood and affect. His behavior is normal. Judgment and thought content normal.  Nursing note and vitals reviewed.    ED Treatments / Results  Labs (all labs ordered are listed, but only abnormal results are displayed) Labs Reviewed - No data to display  EKG  EKG Interpretation None       Radiology Dg Cervical Spine Complete  Result Date: 07/14/2017 CLINICAL DATA:  Acute cervical spine/ neck pain today. No known injury. EXAM: CERVICAL SPINE - COMPLETE 4+ VIEW COMPARISON:  06/23/2017 radiographs FINDINGS: There is no evidence of cervical spine fracture or prevertebral soft tissue swelling. Alignment is normal. No other significant bone abnormalities are identified. IMPRESSION: Negative cervical spine radiographs. Electronically Signed   By: Harmon PierJeffrey  Hu M.D.   On: 07/14/2017 20:30    Procedures Procedures (including critical care time)  Medications Ordered in ED Medications - No data to display   Initial Impression / Assessment and Plan / ED Course  I have reviewed the triage vital signs and the nursing notes.  Pertinent labs & imaging results that were available during my care of the patient were reviewed by me and considered in my medical decision making (see chart for details).     Patient with nausea, vomiting, and diarrhea that started today.  No focal abdominal tenderness.  Abdomen is soft.  VSS.  No vomiting in ED.    Patient appears stable for discharge.  Likely viral.  Will discharge with zofran.  Patient instructed to return for:  New or worsening symptoms, including, increased abdominal pain, especially pain that localizes to one side, bloody vomit, bloody diarrhea, fever >101, and intractable  vomiting.   Final Clinical Impressions(s) / ED Diagnoses   Final diagnoses:  Nausea vomiting and diarrhea    ED Discharge Orders        Ordered    ondansetron (ZOFRAN ODT) 4 MG disintegrating tablet  Every 8 hours PRN     07/16/17 0030       Roxy HorsemanBrowning, Aarish Rockers, PA-C 07/16/17 0033    Geoffery Lyonselo, Douglas, MD 07/16/17 [phone]

## 2017-07-20 ENCOUNTER — Encounter (HOSPITAL_COMMUNITY): Payer: Self-pay | Admitting: Emergency Medicine

## 2017-07-20 ENCOUNTER — Emergency Department (HOSPITAL_COMMUNITY)
Admission: EM | Admit: 2017-07-20 | Discharge: 2017-07-20 | Disposition: A | Discharge status: Home / Self Care | Payer: Self-pay | Attending: Emergency Medicine | Admitting: Emergency Medicine

## 2017-07-20 ENCOUNTER — Other Ambulatory Visit: Payer: Self-pay

## 2017-07-20 DIAGNOSIS — R0981 Nasal congestion: Secondary | ICD-10-CM | POA: Insufficient documentation

## 2017-07-20 DIAGNOSIS — Z87891 Personal history of nicotine dependence: Secondary | ICD-10-CM | POA: Insufficient documentation

## 2017-07-20 MED ORDER — IBUPROFEN 800 MG PO TABS
800.0000 mg | ORAL_TABLET | Freq: Once | ORAL | Status: AC
  Administered 2017-07-20: 800 mg via ORAL
  Filled 2017-07-20: qty 1

## 2017-07-20 NOTE — ED Triage Notes (Signed)
Pt c/o R facial pain / headache since last night. Pt states pain radiates from eye to upper jaw.

## 2017-07-20 NOTE — ED Notes (Signed)
Pt is alert and oriented x 4 and is verbally response. Pt reports rt sided face pain x 1 day that radiates to rt side of jaw 10/10  Aches/pressure pain.

## 2017-07-20 NOTE — ED Provider Notes (Signed)
[facility]-EMERGENC[facility] DEPT Provider Note   CSN: [id number] Arrival date & time: 07/20/17  1433     History   Chief Complaint Chief Complaint  Patient presents with  . Facial Pain    HPI Collin White is a 21 y.o. male.  The history is provided by the patient. No language interpreter was used.  URI   This is a new problem. The current episode started 1 to 2 hours ago. The problem has not changed since onset.There has been no fever. Associated symptoms include congestion. Pertinent negatives include no cough. The treatment provided no relief.  Pt complains of nasal congestion today.  Pt reports he has had a headache.  Pt has not taken anything.   Past Medical History:  Diagnosis Date  . Psychoses (HCC)   . Schizophrenia (HCC)   . Seizures [facility](HCC)     Patient Active Problem List   Diagnosis Date Noted  . Schizophrenia (HCC) 06/30/2017  . Schizophrenia, paranoid type (HCC) 06/08/2017  . 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 Psychoses (HCC)   . Paranoid schizophrenia (HCC) 09/29/2015  . Cannabis use disorder, moderate, in early remission (HCC) 09/26/2015  . History of posttraumatic stress disorder (PTSD) 09/26/2015    Past Surgical History:  Procedure Laterality Date  . abd surgery s/p traumatic event    . facial reconstructive surgery    . NO PAST SURGERIES  patient stated he had facial reconstruction surgery as a child   . tumor removed from head          Home Medications    Prior to Admission medications   Medication Sig Start Date End Date Taking? Authorizing Provider  citalopram (CELEXA) 20 MG tablet Take 1 tablet (20 mg total) by mouth daily. For depression 07/15/17   Armandina StammerNwoko, Agnes I, NP  hydrOXYzine (ATARAX/VISTARIL) 25 MG tablet Take 1 tablet (25 mg total) by mouth every 6 (six) hours as needed for anxiety. 07/14/17   Armandina StammerNwoko, Agnes I, NP  ibuprofen (ADVIL,MOTRIN) 200 MG tablet Take 400 mg by mouth every 6 (six) hours as needed  for moderate pain.    [provider]  naproxen (NAPROSYN) 375 MG tablet Take 1 tablet (375 mg total) by mouth 2 (two) times daily. 07/14/17   Janne NapoleonNeese, Hope M, NP  OLANZapine (ZYPREXA) 15 MG tablet Take 2 tablets (30 mg total) by mouth at bedtime. For mood control 07/14/17   Armandina StammerNwoko, Agnes I, NP  ondansetron (ZOFRAN ODT) 4 MG disintegrating tablet Take 1 tablet (4 mg total) by mouth every 8 (eight) hours as needed for nausea or vomiting. 07/16/17   Roxy HorsemanBrowning, Robert, PA-C  traZODone (DESYREL) 100 MG tablet Take 1 tablet 100 mg by mouth at bedtime: For sleep 07/14/17   Sanjuana KavaNwoko, Agnes I, NP    Family History Family History  Problem Relation Age of Onset  . Hypertension Other   . Mental illness Neg Hx     Social History Social History   Tobacco Use  . Smoking status: Former Smoker    Types: Cigarettes  . Smokeless tobacco: Never Used  Substance Use Topics  . Alcohol use: No  . Drug use: No     Allergies   Patient has no known allergies.   Review of Systems Review of Systems  HENT: Positive for congestion.   Respiratory: Negative for cough.   All other systems reviewed and are negative.    Physical Exam Updated Vital Signs BP 139/83 (BP Location: Left Arm)   Pulse (!) 104  Temp 99.5 F (37.5 C)   Resp 16   Ht 6' (1.829 m)   Wt 63.5 kg (140 lb)   SpO2 100%   BMI 18.99 kg/m   Physical Exam  Constitutional: He appears well-developed and well-nourished.  HENT:  Head: Normocephalic and atraumatic.  Right Ear: External ear normal.  Left Ear: External ear normal.  Nose: Nose normal.  Mouth/Throat: Oropharynx is clear and moist.  Eyes: Conjunctivae and EOM are normal. Pupils are equal, round, and reactive to light.  Neck: Neck supple.  Cardiovascular: Normal rate and regular rhythm.  No murmur heard. Pulmonary/Chest: Effort normal and breath sounds normal. No respiratory distress.  Abdominal: There is no tenderness.  Musculoskeletal: He exhibits no edema.    Neurological: He is alert.  Skin: Skin is warm and dry.  Psychiatric: He has a normal mood and affect.  Nursing note and vitals reviewed.    ED Treatments / Results  Labs (all labs ordered are listed, but only abnormal results are displayed) Labs Reviewed - No data to display  EKG  EKG Interpretation None       Radiology No results found.  Procedures Procedures (including critical care time)  Medications Ordered in ED Medications  ibuprofen (ADVIL,MOTRIN) tablet 800 mg (not administered)     Initial Impression / Assessment and Plan / ED Course  I have reviewed the triage vital signs and the nursing notes.  Pertinent labs & imaging results that were available during my care of the patient were reviewed by me and considered in my medical decision making (see chart for details).    Pt given ibuprofen.  Pt has normal exam.     Final Clinical Impressions(s) / ED Diagnoses   Final diagnoses:  Nasal congestion    ED Discharge Orders    None    An After Visit Summary was printed and given to the patient.    Elson AreasSofia, Leslie K, PA-C 07/20/17 1714    Lorre NickAllen, Eban Weick, MD 07/21/17 [phone]

## 2017-07-21 DIAGNOSIS — R51 Headache: Secondary | ICD-10-CM | POA: Insufficient documentation

## 2017-07-21 DIAGNOSIS — Z87891 Personal history of nicotine dependence: Secondary | ICD-10-CM | POA: Insufficient documentation

## 2017-07-22 ENCOUNTER — Encounter (HOSPITAL_COMMUNITY): Payer: Self-pay | Admitting: Emergency Medicine

## 2017-07-22 ENCOUNTER — Other Ambulatory Visit: Payer: Self-pay

## 2017-07-22 ENCOUNTER — Emergency Department (HOSPITAL_COMMUNITY)
Admission: EM | Admit: 2017-07-22 | Discharge: 2017-07-22 | Disposition: A | Discharge status: Home / Self Care | Payer: Self-pay | Attending: Emergency Medicine | Admitting: Emergency Medicine

## 2017-07-22 DIAGNOSIS — R519 Headache, unspecified: Secondary | ICD-10-CM

## 2017-07-22 DIAGNOSIS — R51 Headache: Secondary | ICD-10-CM

## 2017-07-22 NOTE — ED Triage Notes (Signed)
Pt states the right side of his head hurts and his nose has been bleeding

## 2017-07-22 NOTE — ED Notes (Signed)
Pt continues to sleep in lobby 

## 2017-07-22 NOTE — ED Notes (Signed)
Sleeping in lobby

## 2017-07-22 NOTE — ED Notes (Signed)
Pt asleep in lobby

## 2017-07-22 NOTE — Discharge Instructions (Signed)

## 2017-07-22 NOTE — ED Notes (Signed)
Pt continues to sleep  No acute distress noted

## 2017-07-22 NOTE — ED Provider Notes (Signed)
[facility]-EMERG[facility] DEPT Provider Note   CSN: [id number] Arrival date & time: 07/21/17  2023     History   Chief Complaint Chief Complaint  Patient presents with  . Headache    HPI Collin White is a 21 y.o. male.  The history is provided by the patient.  Headache   This is a chronic problem. The problem occurs constantly. The problem has not changed since onset.Pertinent negatives include no fever and no vomiting.  per chart, pt is a transgendered male to male She reports right sided facial pain/headache as well as "snotting blood" No fever/vomiting  no trauma No other complaints  Past Medical History:  Diagnosis Date  . Psychoses (HCC)   . Schizophrenia (HCC)   . Seizures [facility](HCC)     Patient Active Problem List   Diagnosis Date Noted  . Schizophrenia (HCC) 06/30/2017  . Schizophrenia, paranoid type (HCC) 06/08/2017  . 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 Psychoses (HCC)   . Paranoid schizophrenia (HCC) 09/29/2015  . Cannabis use disorder, moderate, in early remission (HCC) 09/26/2015  . History of posttraumatic stress disorder (PTSD) 09/26/2015    Past Surgical History:  Procedure Laterality Date  . abd surgery s/p traumatic event    . facial reconstructive surgery    . NO PAST SURGERIES  patient stated he had facial reconstruction surgery as a child   . tumor removed from head          Home Medications    Prior to Admission medications   Medication Sig Start Date End Date Taking? Authorizing Provider  ibuprofen (ADVIL,MOTRIN) 200 MG tablet Take 400 mg by mouth every 6 (six) hours as needed for moderate pain.   Yes [provider]  citalopram (CELEXA) 20 MG tablet Take 1 tablet (20 mg total) by mouth daily. For depression 07/15/17   Armandina StammerNwoko, Agnes I, NP  hydrOXYzine (ATARAX/VISTARIL) 25 MG tablet Take 1 tablet (25 mg total) by mouth every 6 (six) hours as needed for anxiety. 07/14/17   Armandina StammerNwoko, Agnes I, NP    naproxen (NAPROSYN) 375 MG tablet Take 1 tablet (375 mg total) by mouth 2 (two) times daily. 07/14/17   Janne NapoleonNeese, Hope M, NP  OLANZapine (ZYPREXA) 15 MG tablet Take 2 tablets (30 mg total) by mouth at bedtime. For mood control 07/14/17   Armandina StammerNwoko, Agnes I, NP  ondansetron (ZOFRAN ODT) 4 MG disintegrating tablet Take 1 tablet (4 mg total) by mouth every 8 (eight) hours as needed for nausea or vomiting. 07/16/17   Roxy HorsemanBrowning, Robert, PA-C  traZODone (DESYREL) 100 MG tablet Take 1 tablet 100 mg by mouth at bedtime: For sleep 07/14/17   Sanjuana KavaNwoko, Agnes I, NP    Family History Family History  Problem Relation Age of Onset  . Hypertension Other   . Mental illness Neg Hx     Social History Social History   Tobacco Use  . Smoking status: Former Smoker    Types: Cigarettes  . Smokeless tobacco: Never Used  Substance Use Topics  . Alcohol use: No  . Drug use: No     Allergies   Patient has no known allergies.   Review of Systems Review of Systems  Constitutional: Negative for fever.  Gastrointestinal: Negative for vomiting.  Neurological: Positive for headaches.     Physical Exam Updated Vital Signs BP (!) 123/42 (BP Location: Left Arm)   Pulse 73   Temp 98 F (36.7 C) (Oral)   Resp 18   SpO2 100%  Physical Exam  CONSTITUTIONAL: disheveled HEAD: Normocephalic/atraumatic EYES: EOMI/PERRL ENMT: Mucous membranes moist, no evidence of epistaxis.  No blood in mouth.  No signs of trauma to face NECK: supple no meningeal signs CV: S1/S2 noted, no murmurs/rubs/gallops noted LUNGS: Lungs are clear to auscultation bilaterally, no apparent distress ABDOMEN: soft  NEURO: Pt is awake/alert/appropriate, moves all extremitiesx4.  No facial droop.  Ambulatory, no arm/leg drift, no obvious facial droop EXTREMITIES: pulses normal/equal, full ROM SKIN: warm, color normal PSYCH: flat affect  ED Treatments / Results  Labs (all labs ordered are listed, but only abnormal results are  displayed) Labs Reviewed - No data to display  EKG  EKG Interpretation None       Radiology No results found.  Procedures Procedures (including critical care time)  Medications Ordered in ED Medications - No data to display   Initial Impression / Assessment and Plan / ED Course  I have reviewed the triage vital signs and the nursing notes.     Pt with >40 ED visits in past 6 months Currently has recurrent HA/facial pain No distress Afebrile No neuro deficits Will d/c home   Final Clinical Impressions(s) / ED Diagnoses   Final diagnoses:  Facial pain    ED Discharge Orders    None       Zadie RhineWickline, Lindberg Zenon, MD 07/22/17 [phone]

## 2017-07-23 ENCOUNTER — Emergency Department (HOSPITAL_COMMUNITY)
Admission: EM | Admit: 2017-07-23 | Discharge: 2017-07-24 | Disposition: A | Discharge status: Home / Self Care | Payer: Self-pay | Attending: Emergency Medicine | Admitting: Emergency Medicine

## 2017-07-23 ENCOUNTER — Encounter (HOSPITAL_COMMUNITY): Payer: Self-pay | Admitting: Emergency Medicine

## 2017-07-23 DIAGNOSIS — Z87891 Personal history of nicotine dependence: Secondary | ICD-10-CM | POA: Insufficient documentation

## 2017-07-23 DIAGNOSIS — Z590 Homelessness: Secondary | ICD-10-CM | POA: Insufficient documentation

## 2017-07-23 DIAGNOSIS — R460 Very low level of personal hygiene: Secondary | ICD-10-CM | POA: Insufficient documentation

## 2017-07-23 DIAGNOSIS — Z79899 Other long term (current) drug therapy: Secondary | ICD-10-CM | POA: Insufficient documentation

## 2017-07-23 DIAGNOSIS — M791 Myalgia, unspecified site: Secondary | ICD-10-CM | POA: Insufficient documentation

## 2017-07-23 DIAGNOSIS — R112 Nausea with vomiting, unspecified: Secondary | ICD-10-CM | POA: Insufficient documentation

## 2017-07-23 NOTE — ED Triage Notes (Signed)
Patient here with complaints of headache that started ago. Nausea, no vomiting. Hx of same. Homeless.

## 2017-07-23 NOTE — ED Notes (Signed)
Bed: WLPT1 Expected date:  Expected time:  Means of arrival:  Comments: 

## 2017-07-24 ENCOUNTER — Encounter (HOSPITAL_COMMUNITY): Payer: Self-pay

## 2017-07-24 ENCOUNTER — Other Ambulatory Visit: Payer: Self-pay

## 2017-07-24 DIAGNOSIS — Z87891 Personal history of nicotine dependence: Secondary | ICD-10-CM | POA: Insufficient documentation

## 2017-07-24 DIAGNOSIS — R1032 Left lower quadrant pain: Secondary | ICD-10-CM | POA: Insufficient documentation

## 2017-07-24 DIAGNOSIS — R197 Diarrhea, unspecified: Secondary | ICD-10-CM | POA: Insufficient documentation

## 2017-07-24 DIAGNOSIS — Z79899 Other long term (current) drug therapy: Secondary | ICD-10-CM | POA: Insufficient documentation

## 2017-07-24 DIAGNOSIS — R112 Nausea with vomiting, unspecified: Secondary | ICD-10-CM | POA: Insufficient documentation

## 2017-07-24 MED ORDER — ONDANSETRON 4 MG PO TBDP
4.0000 mg | ORAL_TABLET | Freq: Once | ORAL | Status: AC
  Administered 2017-07-24: 4 mg via ORAL
  Filled 2017-07-24: qty 1

## 2017-07-24 MED ORDER — ACETAMINOPHEN 500 MG PO TABS
1000.0000 mg | ORAL_TABLET | Freq: Once | ORAL | Status: AC
  Administered 2017-07-24: 1000 mg via ORAL
  Filled 2017-07-24: qty 2

## 2017-07-24 NOTE — ED Provider Notes (Signed)
TIME SEEN: 5:48 AM  CHIEF COMPLAINT: Body aches, nausea and vomiting  HPI: Patient is a 21 year old male with history of schizophrenia with frequent visits to the emergency department with 44 visits in the past 6 months who presents to the emergency department today with complaints of body aches, nausea and vomiting.  Initially told triage nurse that he was having a headache.  He denies this to me currently.  States that he has been vomiting but has been in the waiting room for several hours with no vomiting.  No diarrhea.  No abdominal pain.  No fever.  No sore throat, cough.  No neck pain or neck stiffness.  Patient is homeless.  ROS: See HPI Constitutional: no fever  Eyes: no drainage  ENT: no runny nose   Cardiovascular:  no chest pain  Resp: no SOB  GI: no vomiting GU: no dysuria Integumentary: no rash  Allergy: no hives  Musculoskeletal: no leg swelling  Neurological: no slurred speech ROS otherwise negative  PAST MEDICAL HISTORY/PAST SURGICAL HISTORY:  Past Medical History:  Diagnosis Date  . Psychoses (HCC)   . Schizophrenia (HCC)   . Seizures (HCC)     MEDICATIONS:  Prior to Admission medications   Medication Sig Start Date End Date Taking? Authorizing Provider  citalopram (CELEXA) 20 MG tablet Take 1 tablet (20 mg total) by mouth daily. For depression 07/15/17   Armandina StammerNwoko, Agnes I, NP  hydrOXYzine (ATARAX/VISTARIL) 25 MG tablet Take 1 tablet (25 mg total) by mouth every 6 (six) hours as needed for anxiety. 07/14/17   Armandina StammerNwoko, Agnes I, NP  ibuprofen (ADVIL,MOTRIN) 200 MG tablet Take 400 mg by mouth every 6 (six) hours as needed for moderate pain.    [provider]  naproxen (NAPROSYN) 375 MG tablet Take 1 tablet (375 mg total) by mouth 2 (two) times daily. 07/14/17   Janne NapoleonNeese, Hope M, NP  OLANZapine (ZYPREXA) 15 MG tablet Take 2 tablets (30 mg total) by mouth at bedtime. For mood control 07/14/17   Armandina StammerNwoko, Agnes I, NP  ondansetron (ZOFRAN ODT) 4 MG disintegrating tablet  Take 1 tablet (4 mg total) by mouth every 8 (eight) hours as needed for nausea or vomiting. 07/16/17   Roxy HorsemanBrowning, Robert, PA-C  traZODone (DESYREL) 100 MG tablet Take 1 tablet 100 mg by mouth at bedtime: For sleep 07/14/17   Sanjuana KavaNwoko, Agnes I, NP    ALLERGIES:  No Known Allergies  SOCIAL HISTORY:  Social History   Tobacco Use  . Smoking status: Former Smoker    Types: Cigarettes  . Smokeless tobacco: Never Used  Substance Use Topics  . Alcohol use: No    FAMILY HISTORY: Family History  Problem Relation Age of Onset  . Hypertension Other   . Mental illness Neg Hx     EXAM: BP 130/68 (BP Location: Left Arm)   Pulse 88   Temp 98 F (36.7 C) (Oral)   Resp 18   SpO2 99%  CONSTITUTIONAL: Alert and oriented and responds appropriately to questions.  Chronically ill-appearing, poor hygiene HEAD: Normocephalic EYES: Conjunctivae clear, pupils appear equal, EOMI ENT: normal nose; moist mucous membranes NECK: Supple, no meningismus, no nuchal rigidity, no LAD  CARD: RRR; S1 and S2 appreciated; no murmurs, no clicks, no rubs, no gallops RESP: Normal chest excursion without splinting or tachypnea; breath sounds clear and equal bilaterally; no wheezes, no rhonchi, no rales, no hypoxia or respiratory distress, speaking full sentences ABD/GI: Normal bowel sounds; non-distended; soft, non-tender, no rebound, no guarding, no peritoneal  signs, no hepatosplenomegaly BACK:  The back appears normal and is non-tender to palpation, there is no CVA tenderness EXT: Normal ROM in all joints; non-tender to palpation; no edema; normal capillary refill; no cyanosis, no calf tenderness or swelling    SKIN: Normal color for age and race; warm; no rash NEURO: Moves all extremities equally PSYCH: Flat affect.  Poor hygiene.  MEDICAL DECISION MAKING: Patient here with complaints of body aches and nausea vomiting.  Abdominal exam benign.  Hemodynamically stable, afebrile, nontoxic.  We will treat  symptomatically with Tylenol and Zofran.  I feel he is safe to be discharged.  I do not feel he needs further emergent workup.  Doubt appendicitis, colitis, bowel obstruction, pancreatitis, cholecystitis.  Could be a viral illness.  Doubt meningitis, pneumonia, deep space neck infection.  At this time, I do not feel there is any life-threatening condition present. I have reviewed and discussed all results (EKG, imaging, lab, urine as appropriate) and exam findings with patient/family. I have reviewed nursing notes and appropriate previous records.  I feel the patient is safe to be discharged home without further emergent workup and can continue workup as an outpatient as needed. Discussed usual and customary return precautions. Patient/family verbalize understanding and are comfortable with this plan.  Outpatient follow-up has been provided if needed. All questions have been answered.      Lincy Belles, Layla MawKristen N, DO 07/24/17 [phone]

## 2017-07-24 NOTE — Discharge Instructions (Signed)
You may alternate Tylenol 1000 mg every 6 hours as needed for pain and Ibuprofen 800 mg every 8 hours as needed for pain.  Please take Ibuprofen with food. ° ° ° °To find a primary care or specialty doctor please call [phone] or [phone] to access "[facility] Find a Doctor Service." ° °You may also go on the [facility] website at [email or web address] ° °There are also multiple [facility], [facility], [facility] and [facility] practices throughout the [place] that are frequently accepting new patients. You may find a clinic that is close to your home and contact them. ° °[facility] -  °[address] °[place] [address] °[phone] ° ° °[facility] -  °[address] °[place] [address] °[phone] ° ° °[facility] - °[address]  °[place] [address] °[phone] ° ° °

## 2017-07-24 NOTE — ED Triage Notes (Signed)
Pt reports L sided abdominal pain that started today. He also reports, "I vomited one time in my mouth and pooped 5 times." See here earlier today. Ambulatory.

## 2017-07-24 NOTE — ED Notes (Signed)
Bed: WTR8 Expected date:  Expected time:  Means of arrival:  Comments: 

## 2017-07-25 ENCOUNTER — Emergency Department (HOSPITAL_COMMUNITY)
Admission: EM | Admit: 2017-07-25 | Discharge: 2017-07-25 | Disposition: A | Discharge status: Home / Self Care | Payer: Self-pay | Attending: Emergency Medicine | Admitting: Emergency Medicine

## 2017-07-25 DIAGNOSIS — R1032 Left lower quadrant pain: Secondary | ICD-10-CM

## 2017-07-25 NOTE — ED Notes (Signed)
Pt called to be taken to a room with no answer x 1

## 2017-07-25 NOTE — Discharge Instructions (Signed)
Try imodium.  Return for worsening symptoms.

## 2017-07-25 NOTE — ED Provider Notes (Signed)
[facility]   CSN: [id number] Arrival date & time: 07/24/17  1938     History   Chief Complaint Chief Complaint  Patient presents with  . Abdominal Pain    HPI Collin White is a 21 y.o. male.  21 yo M well-known to this emergency department with 45 visits in the last 6 months comes with a chief complaint of lower abdominal pain.  The patient was seen less than 24 hours ago for nausea and vomiting.  Now the patient has had about 6 bowel movements.  He describes them as watery.  They are nonbloody or dark.  His pain he points to the left lower quadrant.  Describes it as crampy.  Denies fevers or chills.   The history is provided by the patient.  Abdominal Pain   Associated symptoms include diarrhea, nausea and vomiting. Pertinent negatives include fever, headaches, arthralgias and myalgias.  Illness  This is a new problem. The current episode started yesterday. The problem occurs constantly. The problem has been gradually worsening. Associated symptoms include abdominal pain. Pertinent negatives include no chest pain, no headaches and no shortness of breath. Nothing aggravates the symptoms. Nothing relieves the symptoms. He has tried nothing for the symptoms. The treatment provided no relief.    Past Medical History:  Diagnosis Date  . Psychoses (HCC)   . Schizophrenia (HCC)   . Seizures [facility](HCC)     Patient Active Problem List   Diagnosis Date Noted  . Schizophrenia (HCC) 06/30/2017  . Schizophrenia, paranoid type (HCC) 06/08/2017  . 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 Psychoses (HCC)   . Paranoid schizophrenia (HCC) 09/29/2015  . Cannabis use disorder, moderate, in early remission (HCC) 09/26/2015  . History of posttraumatic stress disorder (PTSD) 09/26/2015    Past Surgical History:  Procedure Laterality Date  . abd surgery s/p traumatic event    . facial reconstructive surgery    . NO PAST SURGERIES   patient stated he had facial reconstruction surgery as a child   . tumor removed from head          Home Medications    Prior to Admission medications   Medication Sig Start Date End Date Taking? Authorizing Provider  citalopram (CELEXA) 20 MG tablet Take 1 tablet (20 mg total) by mouth daily. For depression 07/15/17   Armandina StammerNwoko, Agnes I, NP  hydrOXYzine (ATARAX/VISTARIL) 25 MG tablet Take 1 tablet (25 mg total) by mouth every 6 (six) hours as needed for anxiety. 07/14/17   Armandina StammerNwoko, Agnes I, NP  ibuprofen (ADVIL,MOTRIN) 200 MG tablet Take 400 mg by mouth every 6 (six) hours as needed for moderate pain.    [provider]  naproxen (NAPROSYN) 375 MG tablet Take 1 tablet (375 mg total) by mouth 2 (two) times daily. 07/14/17   Janne NapoleonNeese, Hope M, NP  OLANZapine (ZYPREXA) 15 MG tablet Take 2 tablets (30 mg total) by mouth at bedtime. For mood control 07/14/17   Armandina StammerNwoko, Agnes I, NP  ondansetron (ZOFRAN ODT) 4 MG disintegrating tablet Take 1 tablet (4 mg total) by mouth every 8 (eight) hours as needed for nausea or vomiting. 07/16/17   Roxy HorsemanBrowning, Robert, PA-C  traZODone (DESYREL) 100 MG tablet Take 1 tablet 100 mg by mouth at bedtime: For sleep 07/14/17   Sanjuana KavaNwoko, Agnes I, NP    Family History Family History  Problem Relation Age of Onset  . Hypertension Other   . Mental illness Neg Hx     Social History  Social History   Tobacco Use  . Smoking status: Former Smoker    Types: Cigarettes  . Smokeless tobacco: Never Used  Substance Use Topics  . Alcohol use: No  . Drug use: No     Allergies   Patient has no known allergies.   Review of Systems Review of Systems  Constitutional: Negative for chills and fever.  HENT: Negative for congestion and facial swelling.   Eyes: Negative for discharge and visual disturbance.  Respiratory: Negative for shortness of breath.   Cardiovascular: Negative for chest pain and palpitations.  Gastrointestinal: Positive for abdominal pain, diarrhea,  nausea and vomiting.  Musculoskeletal: Negative for arthralgias and myalgias.  Skin: Negative for color change and rash.  Neurological: Negative for tremors, syncope and headaches.  Psychiatric/Behavioral: Negative for confusion and dysphoric mood.     Physical Exam Updated Vital Signs BP 132/70 (BP Location: Left Arm)   Pulse 78   Temp (!) 97.2 F (36.2 C) (Oral)   Resp 14   SpO2 99%   Physical Exam  Constitutional: He is oriented to person, place, and time. He appears well-developed and well-nourished.  HENT:  Head: Normocephalic and atraumatic.  Eyes: EOM are normal. Pupils are equal, round, and reactive to light.  Neck: Normal range of motion. Neck supple. No JVD present.  Cardiovascular: Normal rate and regular rhythm. Exam reveals no gallop and no friction rub.  No murmur heard. Pulmonary/Chest: No respiratory distress. He has no wheezes.  Abdominal: He exhibits no distension. There is no rebound and no guarding.  Musculoskeletal: Normal range of motion.  Neurological: He is alert and oriented to person, place, and time.  Skin: No rash noted. No pallor.  Psychiatric: He has a normal mood and affect. His behavior is normal.  Nursing note and vitals reviewed.    ED Treatments / Results  Labs (all labs ordered are listed, but only abnormal results are displayed) Labs Reviewed - No data to display  EKG  EKG Interpretation None       Radiology No results found.  Procedures Procedures (including critical care time)  Medications Ordered in ED Medications - No data to display   Initial Impression / Assessment and Plan / ED Course  I have reviewed the triage vital signs and the nursing notes.  Pertinent labs & imaging results that were available during my care of the patient were reviewed by me and considered in my medical decision making (see chart for details).     21 yo M who is well-known to this emergency department with 45 visits in the last 6 months  comes in with a chief complaint of abdominal pain nausea vomiting diarrhea.  Likely this is a viral illness.  He has a benign abdominal exam.  He is well-appearing and nontoxic.  He has stable vital signs.  I feel no reason for further workup at this time.  We will have him take Imodium.  The patient does not had any vomiting since he was given Zofran on his last ED visit.  Given return precautions.  2:50 AM:  I have discussed the diagnosis/risks/treatment options with the patient and believe the pt to be eligible for discharge home to follow-up with PCP. We also discussed returning to the ED immediately if new or worsening sx occur. We discussed the sx which are most concerning (e.g., sudden worsening pain, fever, inability to tolerate by mouth) that necessitate immediate return. Medications administered to the patient during their visit and any new prescriptions provided  to the patient are listed below.  Medications given during this visit Medications - No data to display   The patient appears reasonably screen and/or stabilized for discharge and I doubt any other medical condition or other [name] requiring further screening, evaluation, or treatment in the ED at this time prior to discharge.    Final Clinical Impressions(s) / ED Diagnoses   Final diagnoses:  Left lower quadrant pain    ED Discharge Orders    None       Melene Plan, DO 07/25/17 4098

## 2017-07-26 ENCOUNTER — Encounter (HOSPITAL_COMMUNITY): Payer: Self-pay | Admitting: Emergency Medicine

## 2017-07-26 ENCOUNTER — Emergency Department (HOSPITAL_COMMUNITY)
Admission: EM | Admit: 2017-07-26 | Discharge: 2017-07-26 | Disposition: A | Discharge status: Home / Self Care | Payer: Self-pay | Attending: Emergency Medicine | Admitting: Emergency Medicine

## 2017-07-26 ENCOUNTER — Other Ambulatory Visit: Payer: Self-pay

## 2017-07-26 DIAGNOSIS — Z87891 Personal history of nicotine dependence: Secondary | ICD-10-CM | POA: Insufficient documentation

## 2017-07-26 DIAGNOSIS — Z79899 Other long term (current) drug therapy: Secondary | ICD-10-CM | POA: Insufficient documentation

## 2017-07-26 DIAGNOSIS — R110 Nausea: Secondary | ICD-10-CM | POA: Insufficient documentation

## 2017-07-26 DIAGNOSIS — R109 Unspecified abdominal pain: Secondary | ICD-10-CM | POA: Insufficient documentation

## 2017-07-26 DIAGNOSIS — R4183 Borderline intellectual functioning: Secondary | ICD-10-CM | POA: Insufficient documentation

## 2017-07-26 DIAGNOSIS — E876 Hypokalemia: Secondary | ICD-10-CM | POA: Insufficient documentation

## 2017-07-26 LAB — CBC
HCT: 43 % (ref 39.0–52.0)
HEMOGLOBIN: 14.9 g/dL (ref 13.0–17.0)
MCH: 29.3 pg (ref 26.0–34.0)
MCHC: 34.7 g/dL (ref 30.0–36.0)
MCV: 84.6 fL (ref 78.0–100.0)
PLATELETS: 215 10*3/uL (ref 150–400)
RBC: 5.08 MIL/uL (ref 4.22–5.81)
RDW: 13.1 % (ref 11.5–15.5)
WBC: 9.5 10*3/uL (ref 4.0–10.5)

## 2017-07-26 LAB — COMPREHENSIVE METABOLIC PANEL
ALBUMIN: 4.6 g/dL (ref 3.5–5.0)
ALK PHOS: 84 U/L (ref 38–126)
ALT: 15 U/L — ABNORMAL LOW (ref 17–63)
ANION GAP: 13 (ref 5–15)
AST: 27 U/L (ref 15–41)
BILIRUBIN TOTAL: 0.7 mg/dL (ref 0.3–1.2)
BUN: 12 mg/dL (ref 6–20)
CALCIUM: 9.7 mg/dL (ref 8.9–10.3)
CO2: 26 mmol/L (ref 22–32)
Chloride: 100 mmol/L — ABNORMAL LOW (ref 101–111)
Creatinine, Ser: 0.91 mg/dL (ref 0.61–1.24)
GFR calc Af Amer: >60 mL/min (ref >60)
GFR calc non Af Amer: >60 mL/min (ref >60)
GLUCOSE: 85 mg/dL (ref 65–99)
Potassium: 3.1 mmol/L — ABNORMAL LOW (ref 3.5–5.1)
SODIUM: 139 mmol/L (ref 135–145)
TOTAL PROTEIN: 8.4 g/dL — AB (ref 6.5–8.1)

## 2017-07-26 LAB — URINALYSIS, ROUTINE W REFLEX MICROSCOPIC
BILIRUBIN URINE: NEGATIVE
GLUCOSE, UA: NEGATIVE mg/dL
Hgb urine dipstick: NEGATIVE
KETONES UR: NEGATIVE mg/dL
Leukocytes, UA: NEGATIVE
Nitrite: NEGATIVE
PROTEIN: NEGATIVE mg/dL
Specific Gravity, Urine: 1.018 (ref 1.005–1.030)
pH: 5 (ref 5.0–8.0)

## 2017-07-26 MED ORDER — ONDANSETRON HCL 4 MG/2ML IJ SOLN
4.0000 mg | Freq: Once | INTRAMUSCULAR | Status: DC
  Filled 2017-07-26: qty 2

## 2017-07-26 MED ORDER — POTASSIUM CHLORIDE ER 10 MEQ PO TBCR: 10.0000 meq | EXTENDED_RELEASE_TABLET | Freq: Every day | ORAL | 0 refills | Status: DC

## 2017-07-26 MED ORDER — ONDANSETRON HCL 4 MG PO TABS
4.0000 mg | ORAL_TABLET | Freq: Once | ORAL | Status: AC
  Administered 2017-07-26: 4 mg via ORAL
  Filled 2017-07-26: qty 1

## 2017-07-26 MED ORDER — KETOROLAC TROMETHAMINE 15 MG/ML IJ SOLN
15.0000 mg | Freq: Once | INTRAMUSCULAR | Status: AC
  Administered 2017-07-26: 15 mg via INTRAMUSCULAR
  Filled 2017-07-26: qty 1

## 2017-07-26 MED ORDER — KETOROLAC TROMETHAMINE 15 MG/ML IJ SOLN: 15.0000 mg | Freq: Once | INTRAMUSCULAR | Status: DC

## 2017-07-26 MED ORDER — POTASSIUM CHLORIDE CRYS ER 10 MEQ PO TBCR
10.0000 meq | EXTENDED_RELEASE_TABLET | Freq: Once | ORAL | Status: AC
  Administered 2017-07-26: 10 meq via ORAL
  Filled 2017-07-26: qty 1

## 2017-07-26 NOTE — ED Provider Notes (Signed)
[facility] Provider Note   CSN: [id number] Arrival date & time: 07/26/17  1732     History   Chief Complaint Chief Complaint  Patient presents with  . Headache  . Abdominal Pain    HPI Collin White is a 21 y.o. male presenting with abdominal pain and headache.  Patient states he has had persistent abdominal pain since yesterday.  Pain was initially on the left side, but it has moved to the right.  He is unable to describe the pain, just stating that it hurts.  Pain is worse with movement, specifically when he leans forward.  He reports frequent bowel movements, but states he has only had one bowel movement today.  He denies blood in his stool.  He reports nausea without vomiting.  He has associated frontal headache.  This is common for him, he has not taken anything for this.  It feels like his normal headaches.  He last ate a hamburger last night, he has had no food or water today.  He denies fevers, chills, vision changes, slurred speech, chest pain, shortness of breath, or urinary symptoms.  Denies history of abdominal surgeries.  He has not had any lab work done in the past month per chart review.   HPI  Past Medical History:  Diagnosis Date  . Psychoses (HCC)   . Schizophrenia (HCC)   . Seizures [facility])     Patient Active Problem List   Diagnosis Date Noted  . Schizophrenia (HCC) 06/30/2017  . Schizophrenia, paranoid type (HCC) 06/08/2017  . 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 Psychoses (HCC)   . Paranoid schizophrenia (HCC) 09/29/2015  . Cannabis use disorder, moderate, in early remission (HCC) 09/26/2015  . History of posttraumatic stress disorder (PTSD) 09/26/2015    Past Surgical History:  Procedure Laterality Date  . abd surgery s/p traumatic event    . facial reconstructive surgery    . NO PAST SURGERIES  patient stated he had facial reconstruction surgery as a child   . tumor removed from head           Home Medications    Prior to Admission medications   Medication Sig Start Date End Date Taking? Authorizing Provider  citalopram (CELEXA) 20 MG tablet Take 1 tablet (20 mg total) by mouth daily. For depression 07/15/17   Armandina Stammer I, NP  hydrOXYzine (ATARAX/VISTARIL) 25 MG tablet Take 1 tablet (25 mg total) by mouth every 6 (six) hours as needed for anxiety. 07/14/17   Armandina Stammer I, NP  ibuprofen (ADVIL,MOTRIN) 200 MG tablet Take 400 mg by mouth every 6 (six) hours as needed for moderate pain.    [provider]  naproxen (NAPROSYN) 375 MG tablet Take 1 tablet (375 mg total) by mouth 2 (two) times daily. 07/14/17   Janne Napoleon, NP  OLANZapine (ZYPREXA) 15 MG tablet Take 2 tablets (30 mg total) by mouth at bedtime. For mood control 07/14/17   Armandina Stammer I, NP  ondansetron (ZOFRAN ODT) 4 MG disintegrating tablet Take 1 tablet (4 mg total) by mouth every 8 (eight) hours as needed for nausea or vomiting. 07/16/17   Roxy Horseman, PA-C  traZODone (DESYREL) 100 MG tablet Take 1 tablet 100 mg by mouth at bedtime: For sleep 07/14/17   Sanjuana Kava, NP    Family History Family History  Problem Relation Age of Onset  . Hypertension Other   . Mental illness Neg Hx     Social History Social History  Tobacco Use  . Smoking status: Former Smoker    Types: Cigarettes  . Smokeless tobacco: Never Used  Substance Use Topics  . Alcohol use: No  . Drug use: No     Allergies   Patient has no known allergies.   Review of Systems Review of Systems  Constitutional: Negative for chills and fever.  HENT: Negative for congestion and sore throat.   Respiratory: Negative for cough, chest tightness and shortness of breath.   Cardiovascular: Negative for chest pain.  Gastrointestinal: Positive for abdominal pain and nausea. Negative for vomiting.  Genitourinary: Negative for dysuria, frequency and hematuria.  Musculoskeletal: Negative for back pain.  Skin: Negative  for wound.  Allergic/Immunologic: Negative for immunocompromised state.  Hematological: Does not bruise/bleed easily.  Psychiatric/Behavioral: Negative for confusion.     Physical Exam Updated Vital Signs BP 109/69 (BP Location: Right Arm)   Pulse 84   Temp 97.6 F (36.4 C) (Oral)   Resp 12   SpO2 100%   Physical Exam  Constitutional: He is oriented to person, place, and time. He appears well-developed and well-nourished. No distress.  Patient appears nontoxic  HENT:  Head: Normocephalic and atraumatic.  Eyes: EOM are normal.  Neck: Normal range of motion. Neck supple.  Cardiovascular: Normal rate, regular rhythm and intact distal pulses.  Pulmonary/Chest: Effort normal and breath sounds normal. No respiratory distress. He has no wheezes.  Abdominal: Soft. Bowel sounds are normal. He exhibits no distension. There is no tenderness.  Patient reports pain with palpation, but no change in expression during abdominal exam.  Bowel sounds normoactive.  No rigidity, distention, guarding, or rebound.  Negative Murphy's or pain at McBurney's.  Musculoskeletal: Normal range of motion.  Neurological: He is alert and oriented to person, place, and time.  Skin: Skin is warm and dry.  Psychiatric: He has a normal mood and affect.  Nursing note and vitals reviewed.    ED Treatments / Results  Labs (all labs ordered are listed, but only abnormal results are displayed) Labs Reviewed  CBC  COMPREHENSIVE METABOLIC PANEL  URINALYSIS, ROUTINE W REFLEX MICROSCOPIC    EKG  EKG Interpretation None       Radiology No results found.  Procedures Procedures (including critical care time)  Medications Ordered in ED Medications  ketorolac (TORADOL) 15 MG/ML injection 15 mg (not administered)  ondansetron (ZOFRAN) tablet 4 mg (not administered)     Initial Impression / Assessment and Plan / ED Course  I have reviewed the triage vital signs and the nursing notes.  Pertinent labs &  imaging results that were available during my care of the patient were reviewed by me and considered in my medical decision making (see chart for details).     Patient presenting with abdominal pain and headache.  He is a frequent visitor to the ER.  He has not had any lab work done in the past month.  Physical exam reassuring, is afebrile, not tachycardic, appears nontoxic.  Abdominal exam soft and without visible signs of pain.  Will obtain basic labs, give ketorolac and Zofran and reassess.  Labs reassuring.  No leukocytosis, bili and liver enzymes normal.  Patient remains afebrile.  Symptoms improved with ketorolac and Zofran.  P.o. challenge without difficulty.  Patient slightly hypokalemic.  Will give potassium tablet today and prescription for home use.  At this time, I do not believe he needs any further imaging of, doubt appendicitis, intra-abdominal infection, obstruction, perforation.  At this time, patient appears safe for  discharge.  Return precautions given.  Patient states he understands and agrees to plan.   Final Clinical Impressions(s) / ED Diagnoses   Final diagnoses:  None    ED Discharge Orders    None       Alveria ApleyCaccavale, Duane Trias, PA-C 07/26/17 1948    Lorre NickAllen, Anthony, MD 07/29/17 1031

## 2017-07-26 NOTE — ED Triage Notes (Addendum)
Pt verbalizes "I have a headache and my stomach doesn't feel well" onset today. With chart review, it is noted that pt has been seen 12/5, 12/6, and 12/7 for same.

## 2017-07-26 NOTE — Discharge Instructions (Signed)
You likely have a viral illness as cause for your symptoms.  This should improve with time. Your potassium was slightly low.  Take a potassium pill once a day for the next 7 days. Use Tylenol or ibuprofen as needed for headache or abdominal pain. Follow-up with [facility] for further evaluation of abdominal pain and for management of your medical problems. Return to the emergency room if you develop fevers, persistent vomiting, blood in your stool, or any new or worsening symptoms.

## 2017-07-27 ENCOUNTER — Emergency Department (HOSPITAL_COMMUNITY)
Admission: EM | Admit: 2017-07-27 | Discharge: 2017-07-27 | Disposition: A | Discharge status: Home / Self Care | Payer: Self-pay | Attending: Emergency Medicine | Admitting: Emergency Medicine

## 2017-07-27 ENCOUNTER — Encounter (HOSPITAL_COMMUNITY): Payer: Self-pay

## 2017-07-27 ENCOUNTER — Other Ambulatory Visit: Payer: Self-pay

## 2017-07-27 DIAGNOSIS — R4183 Borderline intellectual functioning: Secondary | ICD-10-CM | POA: Insufficient documentation

## 2017-07-27 DIAGNOSIS — G8929 Other chronic pain: Secondary | ICD-10-CM

## 2017-07-27 DIAGNOSIS — R51 Headache: Secondary | ICD-10-CM | POA: Insufficient documentation

## 2017-07-27 DIAGNOSIS — R531 Weakness: Secondary | ICD-10-CM | POA: Insufficient documentation

## 2017-07-27 DIAGNOSIS — Z79899 Other long term (current) drug therapy: Secondary | ICD-10-CM | POA: Insufficient documentation

## 2017-07-27 DIAGNOSIS — Z87891 Personal history of nicotine dependence: Secondary | ICD-10-CM | POA: Insufficient documentation

## 2017-07-27 MED ORDER — IBUPROFEN 800 MG PO TABS
800.0000 mg | ORAL_TABLET | Freq: Once | ORAL | Status: AC
  Administered 2017-07-27: 800 mg via ORAL
  Filled 2017-07-27: qty 1

## 2017-07-27 MED ORDER — IBUPROFEN 200 MG PO TABS: 400.0000 mg | ORAL_TABLET | Freq: Four times a day (QID) | ORAL | 0 refills | Status: DC | PRN

## 2017-07-27 NOTE — ED Provider Notes (Signed)
[facility] Provider Note   CSN: [id number] Arrival date & time: 07/27/17  1349     History   Chief Complaint Chief Complaint  Patient presents with  . Headache  . Weakness    HPI Collin White is a 21 y.o. male presenting for evaluation of headache.  Patient presenting with headache, this is a chronic issue.  He has been seen 5 times in the ER in the past week.  When I evaluated him yesterday, patient also had a headache.  He was given a Toradol shot, and had complete resolution of his symptoms.  Today, patient states that his headache began this morning.  It feels like his normal headaches.  He denies vision changes, slurred speech, weakness, fevers.  He has not taken anything for his headache.  He states he normally takes anti-inflammatories, but does not have any with him.  Patient states that he was told he was not supposed to see primary care anymore due to his headache and history of seizures.  He is [facility] and other programs in the community to establish primary care.  He denies pain elsewhere.  HPI  Past Medical History:  Diagnosis Date  . Psychoses (HCC)   . Schizophrenia (HCC)   . Seizures [facility](HCC)     Patient Active Problem List   Diagnosis Date Noted  . Schizophrenia (HCC) 06/30/2017  . Schizophrenia, paranoid type (HCC) 06/08/2017  . 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 Psychoses (HCC)   . Paranoid schizophrenia (HCC) 09/29/2015  . Cannabis use disorder, moderate, in early remission (HCC) 09/26/2015  . History of posttraumatic stress disorder (PTSD) 09/26/2015    Past Surgical History:  Procedure Laterality Date  . abd surgery s/p traumatic event    . facial reconstructive surgery    . NO PAST SURGERIES  patient stated he had facial reconstruction surgery as a child   . tumor removed from head          Home Medications    Prior to Admission medications   Medication Sig  Start Date End Date Taking? Authorizing Provider  hydrOXYzine (ATARAX/VISTARIL) 25 MG tablet Take 1 tablet (25 mg total) by mouth every 6 (six) hours as needed for anxiety. 07/14/17  Yes Armandina StammerNwoko, Agnes I, NP  OLANZapine (ZYPREXA) 15 MG tablet Take 2 tablets (30 mg total) by mouth at bedtime. For mood control 07/14/17  Yes Armandina StammerNwoko, Agnes I, NP  traZODone (DESYREL) 100 MG tablet Take 1 tablet 100 mg by mouth at bedtime: For sleep 07/14/17  Yes Armandina StammerNwoko, Agnes I, NP  citalopram (CELEXA) 20 MG tablet Take 1 tablet (20 mg total) by mouth daily. For depression 07/15/17   Armandina StammerNwoko, Agnes I, NP  ibuprofen (ADVIL,MOTRIN) 200 MG tablet Take 2 tablets (400 mg total) by mouth every 6 (six) hours as needed for moderate pain. 07/27/17   Fayette Hamada, PA-C  naproxen (NAPROSYN) 375 MG tablet Take 1 tablet (375 mg total) by mouth 2 (two) times daily. Patient not taking: Reported on 07/27/2017 07/14/17   Janne NapoleonNeese, Hope M, NP  ondansetron (ZOFRAN ODT) 4 MG disintegrating tablet Take 1 tablet (4 mg total) by mouth every 8 (eight) hours as needed for nausea or vomiting. Patient not taking: Reported on 07/27/2017 07/16/17   Roxy HorsemanBrowning, Robert, PA-C  potassium chloride (K-DUR) 10 MEQ tablet Take 1 tablet (10 mEq total) by mouth daily. 07/26/17   Wilhemenia Camba, PA-C    Family History Family History  Problem Relation Age  of Onset  . Hypertension Other   . Mental illness Neg Hx     Social History Social History   Tobacco Use  . Smoking status: Former Smoker    Types: Cigarettes  . Smokeless tobacco: Never Used  Substance Use Topics  . Alcohol use: No  . Drug use: No     Allergies   Patient has no known allergies.   Review of Systems Review of Systems  Constitutional: Negative for chills and fever.  Musculoskeletal: Negative for back pain and neck pain.  Skin: Negative for wound.  Neurological: Positive for headaches. Negative for dizziness, seizures and speech difficulty.     Physical Exam Updated  Vital Signs BP 107/61 (BP Location: Left Arm)   Pulse 86   Temp 99.1 F (37.3 C) (Oral)   Resp 16   Ht 6' (1.829 m)   Wt 63.5 kg (140 lb)   SpO2 99%   BMI 18.99 kg/m   Physical Exam  Constitutional: He is oriented to person, place, and time. He appears well-developed and well-nourished. No distress.  HENT:  Head: Normocephalic and atraumatic.  Eyes: Conjunctivae and EOM are normal. Pupils are equal, round, and reactive to light.  Neck: Normal range of motion.  Cardiovascular: Normal rate, regular rhythm and intact distal pulses.  Pulmonary/Chest: Effort normal and breath sounds normal. No respiratory distress. He has no wheezes.  Abdominal: He exhibits no distension.  Musculoskeletal: Normal range of motion.  Moves extremities on command.  Strength intact x4.  Patient ambulatory.  Neurological: He is alert and oriented to person, place, and time. He has normal strength. No cranial nerve deficit or sensory deficit. Gait normal. GCS eye subscore is 4. GCS verbal subscore is 5. GCS motor subscore is 6.  No obvious neurologic deficits.  No appreciable change from yesterday.  Skin: Skin is warm. No rash noted.  Psychiatric: He has a normal mood and affect.  Nursing note and vitals reviewed.    ED Treatments / Results  Labs (all labs ordered are listed, but only abnormal results are displayed) Labs Reviewed - No data to display  EKG  EKG Interpretation None       Radiology No results found.  Procedures Procedures (including critical care time)  Medications Ordered in ED Medications  ibuprofen (ADVIL,MOTRIN) tablet 800 mg (800 mg Oral Given 07/27/17 1539)     Initial Impression / Assessment and Plan / ED Course  I have reviewed the triage vital signs and the nursing notes.  Pertinent labs & imaging results that were available during my care of the patient were reviewed by me and considered in my medical decision making (see chart for details).     Patient is a  frequent patient to the ER.  Presenting with headache.  This is normal for him, and improves with NSAIDs.  Physical exam reassuring today.  No change from yesterday.  Doubt CVA, infection, or concerning etiology at this time. Labs yesterday reassuring. Will give ibuprofen and reassess.  Headache improved with ibuprofen.  Will give information about programs in the community for primary care and follow-up.  Will give prescription for ibuprofen per patient request.  At this time, patient appears safe for discharge.  Return precautions given.  Patient states he understands and agrees to plan.   Final Clinical Impressions(s) / ED Diagnoses   Final diagnoses:  Chronic nonintractable headache, unspecified headache type    ED Discharge Orders        Ordered    ibuprofen (  ADVIL,MOTRIN) 200 MG tablet  Every 6 hours PRN     07/27/17 1546       Alveria Apley, PA-C 07/27/17 1639    Gerhard Munch, MD 07/28/17 [phone]

## 2017-07-27 NOTE — ED Notes (Signed)
As I write this he is eating crackers and peanut butter and drinking a Sprite, all of which he requested.

## 2017-07-27 NOTE — Discharge Instructions (Signed)
Take ibuprofen as needed for headaches.  There is information about [facility] and the local shelters and what services they offer. It is important that you contact [facility] to set up primary care.  Return to the ER if you develop fevers, chills, vision changes, numbness, or any new or worsening symptoms.

## 2017-07-27 NOTE — ED Triage Notes (Signed)
Patient c/o "headache and it causes me to feel weak." patient states it started yesterday.

## 2017-07-28 ENCOUNTER — Encounter (HOSPITAL_COMMUNITY): Payer: Self-pay | Admitting: Family Medicine

## 2017-07-28 ENCOUNTER — Emergency Department (HOSPITAL_COMMUNITY)
Admission: EM | Admit: 2017-07-28 | Discharge: 2017-07-29 | Disposition: A | Discharge status: Home / Self Care | Payer: Self-pay | Attending: Emergency Medicine | Admitting: Emergency Medicine

## 2017-07-28 DIAGNOSIS — Z79899 Other long term (current) drug therapy: Secondary | ICD-10-CM | POA: Insufficient documentation

## 2017-07-28 DIAGNOSIS — Z87891 Personal history of nicotine dependence: Secondary | ICD-10-CM | POA: Insufficient documentation

## 2017-07-28 DIAGNOSIS — G8929 Other chronic pain: Secondary | ICD-10-CM | POA: Insufficient documentation

## 2017-07-28 DIAGNOSIS — R1084 Generalized abdominal pain: Secondary | ICD-10-CM | POA: Insufficient documentation

## 2017-07-28 DIAGNOSIS — R109 Unspecified abdominal pain: Secondary | ICD-10-CM

## 2017-07-28 NOTE — ED Provider Notes (Signed)
[facility]-EMERGENCY DEPT Provider Note   CSN: [id number] Arrival date & time: 07/28/17  2138     History   Chief Complaint Chief Complaint  Patient presents with  . Abdominal Pain    HPI Collin White is a 21 y.o. male.   Patient with numerous visits for complaints of abdominal pain presents, once again, with complaints of abdominal pain.  Patient has been seen every day this week.  This is his 48th visit in 6 months for similar complaints.      Past Medical History:  Diagnosis Date  . Psychoses (HCC)   . Schizophrenia (HCC)   . Seizures [name](HCC)     Patient Active Problem List   Diagnosis Date Noted  . Schizophrenia (HCC) 06/30/2017  . Schizophrenia, paranoid type (HCC) 06/08/2017  . 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 Psychoses (HCC)   . Paranoid schizophrenia (HCC) 09/29/2015  . Cannabis use disorder, moderate, in early remission (HCC) 09/26/2015  . History of posttraumatic stress disorder (PTSD) 09/26/2015    Past Surgical History:  Procedure Laterality Date  . abd surgery s/p traumatic event    . facial reconstructive surgery    . NO PAST SURGERIES  patient stated he had facial reconstruction surgery as a child   . tumor removed from head          Home Medications    Prior to Admission medications   Medication Sig Start Date End Date Taking? Authorizing Provider  citalopram (CELEXA) 20 MG tablet Take 1 tablet (20 mg total) by mouth daily. For depression 07/15/17   Armandina StammerNwoko, Agnes I, NP  hydrOXYzine (ATARAX/VISTARIL) 25 MG tablet Take 1 tablet (25 mg total) by mouth every 6 (six) hours as needed for anxiety. 07/14/17   Armandina StammerNwoko, Agnes I, NP  ibuprofen (ADVIL,MOTRIN) 200 MG tablet Take 2 tablets (400 mg total) by mouth every 6 (six) hours as needed for moderate pain. 07/27/17   Caccavale, Sophia, PA-C  naproxen (NAPROSYN) 375 MG tablet Take 1 tablet (375 mg total) by mouth 2 (two) times daily. Patient not taking: Reported  on 07/27/2017 07/14/17   Janne NapoleonNeese, Hope M, NP  OLANZapine (ZYPREXA) 15 MG tablet Take 2 tablets (30 mg total) by mouth at bedtime. For mood control 07/14/17   Armandina StammerNwoko, Agnes I, NP  ondansetron (ZOFRAN ODT) 4 MG disintegrating tablet Take 1 tablet (4 mg total) by mouth every 8 (eight) hours as needed for nausea or vomiting. Patient not taking: Reported on 07/27/2017 07/16/17   Roxy HorsemanBrowning, Robert, PA-C  potassium chloride (K-DUR) 10 MEQ tablet Take 1 tablet (10 mEq total) by mouth daily. 07/26/17   Caccavale, Sophia, PA-C  traZODone (DESYREL) 100 MG tablet Take 1 tablet 100 mg by mouth at bedtime: For sleep 07/14/17   Sanjuana KavaNwoko, Agnes I, NP    Family History Family History  Problem Relation Age of Onset  . Hypertension Other   . Mental illness Neg Hx     Social History Social History   Tobacco Use  . Smoking status: Former Smoker    Types: Cigarettes  . Smokeless tobacco: Never Used  Substance Use Topics  . Alcohol use: No  . Drug use: No     Allergies   Patient has no known allergies.   Review of Systems Review of Systems  Gastrointestinal: Positive for abdominal pain.  All other systems reviewed and are negative.    Physical Exam Updated Vital Signs BP 117/72 (BP Location: Left Arm)   Pulse 82   Temp 98  F (36.7 C) (Oral)   Resp 20   SpO2 98%   Physical Exam  Constitutional: He is oriented to person, place, and time. He appears well-developed and well-nourished. No distress.  HENT:  Head: Normocephalic and atraumatic.  Right Ear: Hearing normal.  Left Ear: Hearing normal.  Nose: Nose normal.  Mouth/Throat: Oropharynx is clear and moist and mucous membranes are normal.  Eyes: Conjunctivae and EOM are normal. Pupils are equal, round, and reactive to light.  Neck: Normal range of motion. Neck supple.  Cardiovascular: Regular rhythm, S1 normal and S2 normal. Exam reveals no gallop and no friction rub.  No murmur heard. Pulmonary/Chest: Effort normal and breath sounds  normal. No respiratory distress. He exhibits no tenderness.  Abdominal: Soft. Normal appearance and bowel sounds are normal. There is no hepatosplenomegaly. There is generalized tenderness. There is no rebound, no guarding, no tenderness at McBurney's point and negative Murphy's sign. No hernia.  Musculoskeletal: Normal range of motion.  Neurological: He is alert and oriented to person, place, and time. He has normal strength. No cranial nerve deficit or sensory deficit. Coordination normal. GCS eye subscore is 4. GCS verbal subscore is 5. GCS motor subscore is 6.  Skin: Skin is warm, dry and intact. No rash noted. No cyanosis.  Psychiatric: He has a normal mood and affect. His speech is normal and behavior is normal. Thought content normal.  Nursing note and vitals reviewed.    ED Treatments / Results  Labs (all labs ordered are listed, but only abnormal results are displayed) Labs Reviewed - No data to display  EKG  EKG Interpretation None       Radiology No results found.  Procedures Procedures (including critical care time)  Medications Ordered in ED Medications  dicyclomine (BENTYL) capsule 10 mg (not administered)     Initial Impression / Assessment and Plan / ED Course  I have reviewed the triage vital signs and the nursing notes.  Pertinent labs & imaging results that were available during my care of the patient were reviewed by me and considered in my medical decision making (see chart for details).     Patient with chronic abdominal pain and psychiatric illness presents once again with complaints of abdominal pain.  This is his sixth visit this week and 48th in 6 months.He has had numerous workups for this in the past.  He does not require workup today.  Patient appears to be homeless and is likely malingering.  He will be discharged and can stay in the waiting room tonight.  Final Clinical Impressions(s) / ED Diagnoses   Final diagnoses:  Chronic abdominal  pain    ED Discharge Orders    None      Pollina, Canary Brimhristopher J, MD 07/29/17 [phone]

## 2017-07-28 NOTE — ED Triage Notes (Signed)
Patient is complaining of mid abd pain with nausea. Patient has been seen for the same symptoms multiple times. Patient was [name] yesterday and has been sitting in the lobby.

## 2017-07-29 MED ORDER — DICYCLOMINE HCL 10 MG PO CAPS
10.0000 mg | ORAL_CAPSULE | Freq: Once | ORAL | Status: AC
  Administered 2017-07-29: 10 mg via ORAL
  Filled 2017-07-29: qty 1

## 2017-07-29 NOTE — ED Notes (Signed)
Pt was d/c out to the lobby, when I asked him if he was hungry he states that he is very hungry and I gave him a meal tray and some snacks which he is eating.  Drinks also given.

## 2017-07-29 NOTE — ED Notes (Signed)
Pt continues to sleep in room.  Reminded pt to get dressed and that he is discharged

## 2017-08-07 ENCOUNTER — Emergency Department (HOSPITAL_COMMUNITY)
Admission: EM | Admit: 2017-08-07 | Discharge: 2017-08-07 | Disposition: A | Discharge status: Home / Self Care | Payer: Self-pay | Attending: Emergency Medicine | Admitting: Emergency Medicine

## 2017-08-07 ENCOUNTER — Other Ambulatory Visit: Payer: Self-pay

## 2017-08-07 ENCOUNTER — Encounter (HOSPITAL_COMMUNITY): Payer: Self-pay | Admitting: Emergency Medicine

## 2017-08-07 DIAGNOSIS — Z87891 Personal history of nicotine dependence: Secondary | ICD-10-CM | POA: Insufficient documentation

## 2017-08-07 DIAGNOSIS — Z79899 Other long term (current) drug therapy: Secondary | ICD-10-CM | POA: Insufficient documentation

## 2017-08-07 DIAGNOSIS — R51 Headache: Secondary | ICD-10-CM | POA: Insufficient documentation

## 2017-08-07 DIAGNOSIS — F209 Schizophrenia, unspecified: Secondary | ICD-10-CM | POA: Insufficient documentation

## 2017-08-07 DIAGNOSIS — J029 Acute pharyngitis, unspecified: Secondary | ICD-10-CM | POA: Insufficient documentation

## 2017-08-07 LAB — RAPID STREP SCREEN (MED CTR MEBANE ONLY): STREPTOCOCCUS, GROUP A SCREEN (DIRECT): NEGATIVE

## 2017-08-07 MED ORDER — FLUTICASONE PROPIONATE 50 MCG/ACT NA SUSP: 2.0000 | Freq: Every day | NASAL | 2 refills | Status: DC

## 2017-08-07 NOTE — ED Provider Notes (Signed)
[facility]-EMERG[facility] DEPT Provider Note   CSN: [id number] Arrival date & time: 08/07/17  0028     History   Chief Complaint Chief Complaint  Patient presents with  . Sore Throat    HPI Collin White is a 21 y.o. male with a hx of ptosis, schizophrenia presents to the Emergency Department complaining of gradual, persistent, progressively worsening sore throat onset earlier today.  Patient reports no fevers or chills, nausea or vomiting, neck pain or neck stiffness, difficulty eating or drinking.  He does report associated postnasal drip and nasal congestion along with cough.  Reports his cough is nonproductive.  Denies recent sick contacts.  Patient denies shortness of breath, leg swelling, chest pain.  No treatments prior to arrival.  Patient reports that coughing and swallowing makes his symptoms worse.  Nothing makes them better.  The history is provided by the patient and medical records. No language interpreter was used.    Past Medical History:  Diagnosis Date  . Psychoses (HCC)   . Schizophrenia (HCC)   . Seizures [facility](HCC)     Patient Active Problem List   Diagnosis Date Noted  . Schizophrenia (HCC) 06/30/2017  . Schizophrenia, paranoid type (HCC) 06/08/2017  . 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 Psychoses (HCC)   . Paranoid schizophrenia (HCC) 09/29/2015  . Cannabis use disorder, moderate, in early remission (HCC) 09/26/2015  . History of posttraumatic stress disorder (PTSD) 09/26/2015    Past Surgical History:  Procedure Laterality Date  . abd surgery s/p traumatic event    . facial reconstructive surgery    . NO PAST SURGERIES  patient stated he had facial reconstruction surgery as a child   . tumor removed from head          Home Medications    Prior to Admission medications   Medication Sig Start Date End Date Taking? Authorizing Provider  citalopram (CELEXA) 20 MG tablet Take 1 tablet (20 mg total) by mouth daily.  For depression 07/15/17   Armandina StammerNwoko, Agnes I, NP  fluticasone (FLONASE) 50 MCG/ACT nasal spray Place 2 sprays into both nostrils daily. 08/07/17   Demere Dotzler, Dahlia ClientHannah, PA-C  fluticasone (FLONASE) 50 MCG/ACT nasal spray Place 2 sprays into both nostrils daily. 08/07/17   Aldene Hendon, Dahlia ClientHannah, PA-C  hydrOXYzine (ATARAX/VISTARIL) 25 MG tablet Take 1 tablet (25 mg total) by mouth every 6 (six) hours as needed for anxiety. 07/14/17   Armandina StammerNwoko, Agnes I, NP  ibuprofen (ADVIL,MOTRIN) 200 MG tablet Take 2 tablets (400 mg total) by mouth every 6 (six) hours as needed for moderate pain. 07/27/17   Caccavale, Sophia, PA-C  naproxen (NAPROSYN) 375 MG tablet Take 1 tablet (375 mg total) by mouth 2 (two) times daily. Patient not taking: Reported on 07/27/2017 07/14/17   Janne NapoleonNeese, Hope M, NP  OLANZapine (ZYPREXA) 15 MG tablet Take 2 tablets (30 mg total) by mouth at bedtime. For mood control 07/14/17   Armandina StammerNwoko, Agnes I, NP  ondansetron (ZOFRAN ODT) 4 MG disintegrating tablet Take 1 tablet (4 mg total) by mouth every 8 (eight) hours as needed for nausea or vomiting. Patient not taking: Reported on 07/27/2017 07/16/17   Roxy HorsemanBrowning, Robert, PA-C  potassium chloride (K-DUR) 10 MEQ tablet Take 1 tablet (10 mEq total) by mouth daily. 07/26/17   Caccavale, Sophia, PA-C  traZODone (DESYREL) 100 MG tablet Take 1 tablet 100 mg by mouth at bedtime: For sleep 07/14/17   Sanjuana KavaNwoko, Agnes I, NP    Family History Family History  Problem Relation Age of  Onset  . Hypertension Other   . Mental illness Neg Hx     Social History Social History   Tobacco Use  . Smoking status: Former Smoker    Types: Cigarettes  . Smokeless tobacco: Never Used  Substance Use Topics  . Alcohol use: No  . Drug use: No     Allergies   Patient has no known allergies.   Review of Systems Review of Systems  Constitutional: Negative for chills, fatigue and fever.  HENT: Positive for congestion, postnasal drip and sore throat. Negative for dental  problem, drooling, ear pain, facial swelling, mouth sores, rhinorrhea, trouble swallowing and voice change.   Eyes: Negative for pain.  Respiratory: Positive for cough. Negative for chest tightness and shortness of breath.   Cardiovascular: Negative for chest pain.  Gastrointestinal: Negative for abdominal pain, nausea and vomiting.  Musculoskeletal: Negative for neck pain and neck stiffness.  Skin: Negative for rash.  Neurological: Negative for facial asymmetry and headaches.  Hematological: Negative for adenopathy.  Psychiatric/Behavioral: The patient is not nervous/anxious.      Physical Exam Updated Vital Signs BP 121/82 (BP Location: Right Arm)   Temp 98.2 F (36.8 C) (Oral)   Resp 18   Ht 6' (1.829 m)   Wt 63.5 kg (140 lb)   SpO2 99%   BMI 18.99 kg/m   Physical Exam  Constitutional: He appears well-developed and well-nourished. No distress.  HENT:  Head: Normocephalic and atraumatic.  Right Ear: Tympanic membrane, external ear and ear canal normal.  Left Ear: Tympanic membrane, external ear and ear canal normal.  Nose: Mucosal edema and rhinorrhea present.  Mouth/Throat: Uvula is midline and mucous membranes are normal. Mucous membranes are not dry. No trismus in the jaw. No uvula swelling. Posterior oropharyngeal erythema present. No oropharyngeal exudate, posterior oropharyngeal edema or tonsillar abscesses.  Eyes: Conjunctivae are normal.  Neck: Normal range of motion, full passive range of motion without pain and phonation normal. No tracheal tenderness, no spinous process tenderness and no muscular tenderness present. No neck rigidity. No erythema and normal range of motion present. No Brudzinski's sign and no Kernig's sign noted.  Range of motion without pain  No midline or paraspinal tenderness Normal phonation No stridor Handling secretions without difficulty No nuchal rigidity or meningeal signs  Cardiovascular: Normal rate, regular rhythm and normal heart  sounds.  Pulses:      Radial pulses are 2+ on the right side, and 2+ on the left side.  Pulmonary/Chest: Effort normal and breath sounds normal. No stridor. No respiratory distress. He has no decreased breath sounds. He has no wheezes.  Equal chest expansion, clear and equal breath sounds without focal wheezes, rhonchi or rales  Musculoskeletal: Normal range of motion.  Lymphadenopathy:       Head (right side): No submental, no submandibular, no tonsillar, no preauricular, no posterior auricular and no occipital adenopathy present.       Head (left side): No submental, no submandibular, no tonsillar, no preauricular, no posterior auricular and no occipital adenopathy present.    He has no cervical adenopathy.       Right cervical: No superficial cervical, no deep cervical and no posterior cervical adenopathy present.      Left cervical: No superficial cervical, no deep cervical and no posterior cervical adenopathy present.  Neurological: He is alert.  Alert and oriented Moves all extremities without ataxia  Skin: Skin is warm and dry. He is not diaphoretic.  Psychiatric: He has a normal mood  and affect.  Nursing note and vitals reviewed.    ED Treatments / Results  Labs (all labs ordered are listed, but only abnormal results are displayed) Labs Reviewed  RAPID STREP SCREEN (NOT AT [facility])  CULTURE, GROUP A STREP [facility])    Procedures Procedures (including critical care time)  Medications Ordered in ED Medications - No data to display   Initial Impression / Assessment and Plan / ED Course  I have reviewed the triage vital signs and the nursing notes.  Pertinent labs & imaging results that were available during my care of the patient were reviewed by me and considered in my medical decision making (see chart for details).     Pt afebrile without tonsillar exudate, negative strep. Presents with mild cervical lymphadenopathy, & dysphagia; diagnosis of viral pharyngitis. No abx  indicated. Discharged with symptomatic tx for pain  Pt does not appear dehydrated, but did discuss importance of water rehydration. Presentation non concerning for PTA or RPA. No trismus or uvula deviation. Specific return precautions discussed. Pt able to drink water in ED without difficulty with intact air way. Recommended PCP follow up.   Final Clinical Impressions(s) / ED Diagnoses   Final diagnoses:  Sore throat    ED Discharge Orders        Ordered    fluticasone (FLONASE) 50 MCG/ACT nasal spray  Daily     08/07/17 0230    fluticasone (FLONASE) 50 MCG/ACT nasal spray  Daily     08/07/17 0232       Amogh Komatsu, Dahlia ClientHannah, PA-C 08/07/17 0248    Palumbo, April, MD 08/07/17 [id number]

## 2017-08-07 NOTE — Discharge Instructions (Signed)
1. Medications: flonase, usual home medications 2. Treatment: rest, drink plenty of fluids, take tylenol or ibuprofen for fever control and sore throat 3. Follow Up: Please followup with your primary doctor in 3 days for discussion of your diagnoses and further evaluation after today's visit; if you do not have a primary care doctor use the resource guide provided to find one; Return to the ER for high fevers, difficulty breathing or other concerning symptoms

## 2017-08-07 NOTE — ED Triage Notes (Signed)
Patient was requested to leave lobby due to patient had been sitting out in the lobby for hours and did not check in. Once patient was asked to leave, he checked in to be seen for a sore throat. Patient states he has a sore throat but does not have any pain level.

## 2017-08-07 NOTE — ED Notes (Signed)
Bed: WTR5 Expected date:  Expected time:  Means of arrival:  Comments: 

## 2017-08-08 ENCOUNTER — Other Ambulatory Visit: Payer: Self-pay

## 2017-08-08 ENCOUNTER — Encounter (HOSPITAL_COMMUNITY): Payer: Self-pay | Admitting: Emergency Medicine

## 2017-08-08 ENCOUNTER — Emergency Department (HOSPITAL_COMMUNITY)
Admission: EM | Admit: 2017-08-08 | Discharge: 2017-08-09 | Disposition: A | Discharge status: Home / Self Care | Payer: Self-pay | Attending: Emergency Medicine | Admitting: Emergency Medicine

## 2017-08-08 DIAGNOSIS — F200 Paranoid schizophrenia: Secondary | ICD-10-CM | POA: Diagnosis present

## 2017-08-08 DIAGNOSIS — G4489 Other headache syndrome: Secondary | ICD-10-CM

## 2017-08-08 LAB — COMPREHENSIVE METABOLIC PANEL
ALBUMIN: 4.1 g/dL (ref 3.5–5.0)
ALT: 11 U/L — ABNORMAL LOW (ref 17–63)
ANION GAP: 7 (ref 5–15)
AST: 18 U/L (ref 15–41)
Alkaline Phosphatase: 72 U/L (ref 38–126)
BUN: 22 mg/dL — ABNORMAL HIGH (ref 6–20)
CO2: 25 mmol/L (ref 22–32)
Calcium: 9.6 mg/dL (ref 8.9–10.3)
Chloride: 109 mmol/L (ref 101–111)
Creatinine, Ser: 0.91 mg/dL (ref 0.61–1.24)
GLUCOSE: 89 mg/dL (ref 65–99)
POTASSIUM: 3.8 mmol/L (ref 3.5–5.1)
Sodium: 141 mmol/L (ref 135–145)
TOTAL PROTEIN: 7.5 g/dL (ref 6.5–8.1)
Total Bilirubin: 0.8 mg/dL (ref 0.3–1.2)

## 2017-08-08 LAB — CBC
HEMATOCRIT: 39.8 % (ref 39.0–52.0)
Hemoglobin: 13.3 g/dL (ref 13.0–17.0)
MCH: 28.6 pg (ref 26.0–34.0)
MCHC: 33.4 g/dL (ref 30.0–36.0)
MCV: 85.6 fL (ref 78.0–100.0)
Platelets: 214 10*3/uL (ref 150–400)
RBC: 4.65 MIL/uL (ref 4.22–5.81)
RDW: 13.4 % (ref 11.5–15.5)
WBC: 7.1 10*3/uL (ref 4.0–10.5)

## 2017-08-08 LAB — RAPID URINE DRUG SCREEN, HOSP PERFORMED
Amphetamines: NOT DETECTED
BENZODIAZEPINES: NOT DETECTED
Barbiturates: NOT DETECTED
COCAINE: NOT DETECTED
OPIATES: NOT DETECTED
Tetrahydrocannabinol: NOT DETECTED

## 2017-08-08 LAB — ETHANOL

## 2017-08-08 MED ORDER — CITALOPRAM HYDROBROMIDE 10 MG PO TABS
10.0000 mg | ORAL_TABLET | Freq: Every day | ORAL | Status: DC
  Administered 2017-08-08 – 2017-08-09 (×2): 10 mg via ORAL
  Filled 2017-08-08 (×2): qty 1

## 2017-08-08 MED ORDER — ACETAMINOPHEN 500 MG PO TABS
1000.0000 mg | ORAL_TABLET | Freq: Once | ORAL | Status: AC
  Administered 2017-08-08: 1000 mg via ORAL
  Filled 2017-08-08: qty 2

## 2017-08-08 MED ORDER — OLANZAPINE 10 MG PO TABS
30.0000 mg | ORAL_TABLET | Freq: Every day | ORAL | Status: DC
  Administered 2017-08-08: 30 mg via ORAL
  Filled 2017-08-08: qty 3

## 2017-08-08 NOTE — ED Notes (Addendum)
In the bathroom to shower and change scrubs, talking.

## 2017-08-08 NOTE — ED Notes (Signed)
Hourly rounding reveals patient sleeping in room. No complaints, stable, in no acute distress. Q15 minute rounds and monitoring via Security Cameras to continue. 

## 2017-08-08 NOTE — Progress Notes (Signed)
TTS attempted to assess the pt. Pt is sleeping and unable to be aroused per nurse Jillyn HiddenGary, RN. TTS to complete once pt is alert.  Princess BruinsAquicha Timon Geissinger, MSW, LCSW Therapeutic Triage Specialist  [phone]

## 2017-08-08 NOTE — ED Notes (Signed)
Pt. Transferred to [facility] from ED to room 34 after screening for contraband. Report to include Situation, Background, Assessment and Recommendations [facility]. Pt. Oriented to unit including Q15 minute rounds as well as the security cameras for their protection. Patient is alert and oriented, warm and dry in no acute distress. Patient denies SI, HI, and AVH. Pt. Encouraged to let me know if needs arise.

## 2017-08-08 NOTE — ED Notes (Signed)
Report to include Situation, Background, Assessment, and Recommendations received from Janie RN. Patient alert and oriented, warm and dry, in no acute distress. Patient denies SI, HI, AVH and pain. Patient made aware of Q15 minute rounds and security cameras for their safety. Patient instructed to come to me with needs or concerns.  

## 2017-08-08 NOTE — BH Assessment (Signed)
[name] Assessment Progress Note  Case was staffed with Arfeen MD and Arville CareParks NP who recommended patient be evaluated for possible medication interventions and observed/monitored for safety. Patient will be seen by psychiatry in the a.m.

## 2017-08-08 NOTE — ED Triage Notes (Signed)
Patient states he needs to see a psychiatrist.
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
## 2017-08-08 NOTE — ED Notes (Signed)
Up to the bathroom 

## 2017-08-09 DIAGNOSIS — F200 Paranoid schizophrenia: Secondary | ICD-10-CM

## 2017-08-09 DIAGNOSIS — Z87891 Personal history of nicotine dependence: Secondary | ICD-10-CM

## 2017-08-09 LAB — CULTURE, GROUP A STREP (THRC)

## 2017-08-09 NOTE — ED Notes (Signed)
Hourly rounding reveals patient sleeping in room. No complaints, stable, in no acute distress. Q15 minute rounds and monitoring via Security Cameras to continue. 

## 2017-08-09 NOTE — Discharge Instructions (Signed)
Follow up with:  Follow-up Information    [name] Follow up.   Specialty:  Behavioral Health Why:  Follow up for out-patient resources  Contact information: [address] [place] [address] [phone]

## 2017-08-09 NOTE — Consult Note (Signed)
[place] Psychiatry Consult   Reason for Consult:  Delusional thinking Referring Physician:  EDP Patient Identification: Collin White MRN:  [id number] Principal Diagnosis: Paranoid schizophrenia [facility]) Diagnosis:   Patient Active Problem List   Diagnosis Date Noted  . Schizophrenia ([place]) [F20.9] 06/30/2017  . Schizophrenia, paranoid type ([place]) [F20.0] 06/08/2017  . 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 Psychoses ([place]) [F29]   . Paranoid schizophrenia ([place]) [F20.0] 09/29/2015  . Cannabis use disorder, moderate, in early remission ([place]) [F12.21] 09/26/2015  . History of posttraumatic stress disorder (PTSD) [Z86.59] 09/26/2015    Total Time spent with patient: 30 minutes  Subjective:   Collin White is a 21 y.o. male patient admitted with somatic complaint of a headache and appearing to be disorganized.  HPI:  Pt was seen and chart reviewed with treatment team and Dr Adele Schilder. Pt speaks in a low, soft tone and is difficult to understand. Pt is unable to state why he is at the hospital. Pt stated he needs to be in a psychiatric hospital because "he can't remember stuff." Pt is a frequent visitor to the emergency room and has had 50 ED visits and 2 inpatient admissions in the past 6 months. Pt will present to the emergency room with somatic complaints. Pt presented to the [facility] on 08/07/17 at [place] with the complaint of sore throat. He was examined and discharged by the EDP. Pt then returned to the [facility] on 08/07/17 at 2337 with the complaint of a headache. Pt was examined again and because he appeared disorganized he was consulted to psychiatry. Pt has been calm, cooperative and medication compliant while in the [facility].  Pt appears to be at baseline. Pt's UDS and BAL are negative. Pt is followed by [facility] and [name]. Pt is stable and psychiatrically clear for discharge.   Past Psychiatric History: As above  Risk to Self: None Risk to Others: None Prior  Inpatient Therapy: Prior Inpatient Therapy: Yes Prior Therapy Dates: 2018 Prior Therapy Facilty/Provider(s): [name] Reason for Treatment: MH issues Prior Outpatient Therapy: Prior Outpatient Therapy: Yes Prior Therapy Dates: 2018(NA) Prior Therapy Facilty/Provider(s): (Per history) Reason for Treatment: Med mang(NA) Does patient have an ACCT team?: No(Per history) Does patient have Intensive In-House Services?  : No Does patient have [name] services? : No Does patient have P4CC services?: No  Past Medical History:  Past Medical History:  Diagnosis Date  . Psychoses ([place])   . Schizophrenia ([place])   . Seizures ([place])     Past Surgical History:  Procedure Laterality Date  . abd surgery s/p traumatic event    . facial reconstructive surgery    . NO PAST SURGERIES  patient stated he had facial reconstruction surgery as a child   . 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 Hypertension Other   . Mental illness Neg Hx    Family Psychiatric  History: Unknown Social History:  Social History   Substance and Sexual Activity  Alcohol Use No     Social History   Substance and Sexual Activity  Drug Use No    Social History   Socioeconomic History  . Marital status: Single    Spouse name: None  . Number of children: None  . Years of education: None  . Highest education level: None  Social Needs  . Financial resource strain: None  . Food insecurity - worry: None  . Food insecurity -  inability: None  . Transportation needs - medical: None  . Transportation needs - non-medical: None  Occupational History  . None  Tobacco Use  . Smoking status: Former Smoker    Types: Cigarettes  . Smokeless tobacco: Never Used  Substance and Sexual Activity  . Alcohol use: No  . Drug use: No  . Sexual activity: Yes    Birth control/protection: Condom  Other Topics Concern  . None  Social History Narrative   ** Merged History  Encounter **       ** Merged History Encounter **       Additional Social History:    Allergies:  No Known Allergies  Labs:  Results for orders placed or performed during the hospital encounter of 08/08/17 (from the past 48 hour(s))  Rapid urine drug screen (hospital performed)     Status: None   Collection Time: 08/08/17  1:22 AM  Result Value Ref Range   Opiates NONE DETECTED NONE DETECTED   Cocaine NONE DETECTED NONE DETECTED   Benzodiazepines NONE DETECTED NONE DETECTED   Amphetamines NONE DETECTED NONE DETECTED   Tetrahydrocannabinol NONE DETECTED NONE DETECTED   Barbiturates NONE DETECTED NONE DETECTED    Comment: (NOTE) DRUG SCREEN FOR MEDICAL PURPOSES ONLY.  IF CONFIRMATION IS NEEDED FOR ANY PURPOSE, NOTIFY LAB WITHIN 5 DAYS. LOWEST DETECTABLE LIMITS FOR URINE DRUG SCREEN Drug Class                     Cutoff (ng/mL) Amphetamine and metabolites    1000 Barbiturate and metabolites    200 Benzodiazepine                 258 Tricyclics and metabolites     300 Opiates and metabolites        300 Cocaine and metabolites        300 THC                            50   CBC     Status: None   Collection Time: 08/08/17  1:27 AM  Result Value Ref Range   WBC 7.1 4.0 - 10.5 K/uL   RBC 4.65 4.22 - 5.81 MIL/uL   Hemoglobin 13.3 13.0 - 17.0 g/dL   HCT 39.8 39.0 - 52.0 %   MCV 85.6 78.0 - 100.0 fL   MCH 28.6 26.0 - 34.0 pg   MCHC 33.4 30.0 - 36.0 g/dL   RDW 13.4 11.5 - 15.5 %   Platelets 214 150 - 400 K/uL  Comprehensive metabolic panel     Status: Abnormal   Collection Time: 08/08/17  1:27 AM  Result Value Ref Range   Sodium 141 135 - 145 mmol/L   Potassium 3.8 3.5 - 5.1 mmol/L   Chloride 109 101 - 111 mmol/L   CO2 25 22 - 32 mmol/L   Glucose, Bld 89 65 - 99 mg/dL   BUN 22 (H) 6 - 20 mg/dL   Creatinine, Ser 0.91 0.61 - 1.24 mg/dL   Calcium 9.6 8.9 - 10.3 mg/dL   Total Protein 7.5 6.5 - 8.1 g/dL   Albumin 4.1 3.5 - 5.0 g/dL   AST 18 15 - 41 U/L   ALT 11 (L) 17 -  63 U/L   Alkaline Phosphatase 72 38 - 126 U/L   Total Bilirubin 0.8 0.3 - 1.2 mg/dL   GFR calc non Af Amer >60 >60 mL/min   GFR calc  Af Amer >60 >60 mL/min    Comment: (NOTE) The eGFR has been calculated using the CKD EPI equation. This calculation has not been validated in all clinical situations. eGFR's persistently <60 mL/min signify possible Chronic Kidney Disease.    Anion gap 7 5 - 15  Ethanol     Status: None   Collection Time: 08/08/17  1:27 AM  Result Value Ref Range   Alcohol, Ethyl (B) <10 <10 mg/dL    Comment:        LOWEST DETECTABLE LIMIT FOR SERUM ALCOHOL IS 10 mg/dL FOR MEDICAL PURPOSES ONLY     Current Facility-Administered Medications  Medication Dose Route Frequency Provider Last Rate Last Dose  . citalopram (CELEXA) tablet 10 mg  10 mg Oral Daily Ethelene Hal, NP   10 mg at 08/09/17 1027  . OLANZapine (ZYPREXA) tablet 30 mg  30 mg Oral QHS Ethelene Hal, NP   30 mg at 08/08/17 2103   Current Outpatient Medications  Medication Sig Dispense Refill  . citalopram (CELEXA) 20 MG tablet Take 1 tablet (20 mg total) by mouth daily. For depression 30 tablet 0  . hydrOXYzine (ATARAX/VISTARIL) 25 MG tablet Take 1 tablet (25 mg total) by mouth every 6 (six) hours as needed for anxiety. 60 tablet 0  . OLANZapine (ZYPREXA) 15 MG tablet Take 2 tablets (30 mg total) by mouth at bedtime. For mood control 60 tablet 0  . traZODone (DESYREL) 100 MG tablet Take 1 tablet 100 mg by mouth at bedtime: For sleep 30 tablet 0  . fluticasone (FLONASE) 50 MCG/ACT nasal spray Place 2 sprays into both nostrils daily. 9.9 g 2  . fluticasone (FLONASE) 50 MCG/ACT nasal spray Place 2 sprays into both nostrils daily. 9.9 g 2  . ibuprofen (ADVIL,MOTRIN) 200 MG tablet Take 2 tablets (400 mg total) by mouth every 6 (six) hours as needed for moderate pain. (Patient not taking: Reported on 08/08/2017) 30 tablet 0  . naproxen (NAPROSYN) 375 MG tablet Take 1 tablet (375 mg total) by  mouth 2 (two) times daily. (Patient not taking: Reported on 07/27/2017) 20 tablet 0  . ondansetron (ZOFRAN ODT) 4 MG disintegrating tablet Take 1 tablet (4 mg total) by mouth every 8 (eight) hours as needed for nausea or vomiting. (Patient not taking: Reported on 07/27/2017) 10 tablet 0  . potassium chloride (K-DUR) 10 MEQ tablet Take 1 tablet (10 mEq total) by mouth daily. (Patient not taking: Reported on 08/08/2017) 7 tablet 0    Musculoskeletal: Strength & Muscle Tone: within normal limits Gait & Station: normal Patient leans: N/A  Psychiatric Specialty Exam: Physical Exam  Constitutional: He is oriented to person, place, and time. He appears well-developed and well-nourished.  Respiratory: Effort normal.  Musculoskeletal: Normal range of motion.  Neurological: He is alert and oriented to person, place, and time.  Psychiatric: His speech is delayed and slurred. He is agitated. Thought content is delusional. Cognition and memory are normal. He expresses impulsivity. He exhibits a depressed mood.    Review of Systems  Psychiatric/Behavioral: Positive for depression. Negative for hallucinations, memory loss, substance abuse and suicidal ideas. The patient is not nervous/anxious and does not have insomnia.   All other systems reviewed and are negative.   Blood pressure 117/86, pulse (!) 57, temperature (!) 97.4 F (36.3 C), temperature source Oral, resp. rate 18, height 6' (1.829 m), weight 63.5 kg (140 lb), SpO2 99 %.Body mass index is 18.99 kg/m.  General Appearance: Disheveled  Eye Contact:  Fair  Speech:  Slow and speaks in a low, soft tone  Volume:  Decreased  Mood:  Anxious, Depressed and Irritable  Affect:  Congruent and Depressed  Thought Process:  Coherent, Goal Directed and Linear  Orientation:  Full (Time, Place, and Person)  Thought Content:  Illogical, Delusions and Rumination  Suicidal Thoughts:  No  Homicidal Thoughts:  No  Memory:  Immediate;   Fair Recent;    Fair Remote;   Fair  Judgement:  Poor  Insight:  Lacking  Psychomotor Activity:  Decreased  Concentration:  Concentration: Fair and Attention Span: Fair  Recall:  [employer] of Knowledge:  Good  Language:  Good  Akathisia:  No  Handed:  Right  AIMS (if indicated):     Assets:  Agricultural consultant Housing  ADL's:  Intact  Cognition:  WNL  Sleep:        Treatment Plan Summary: Plan Paranoid Schizophrenia  Disposition: No evidence of imminent risk to self or others at present.   Patient does not meet criteria for psychiatric inpatient admission. Supportive therapy provided about ongoing stressors. Discussed crisis plan, support from social network, calling 911, coming to the Emergency Department, and calling Suicide Hotline.  Ethelene Hal, NP 08/09/2017 10:44 AM

## 2017-08-09 NOTE — BH Assessment (Signed)
[name] Assessment Progress Note    Per Dr Lolly MustacheArfeen, discharge to ACTT Team

## 2017-08-09 NOTE — B[facility] Suicide Risk Assessment (Signed)
Suicide Risk Assessment  Discharge Assessment   [facility]   Principal Problem: Paranoid schizophrenia [facility](HCC) Discharge Diagnoses:  Patient Active Problem List   Diagnosis Date Noted  . Schizophrenia (HCC) [F20.9] 06/30/2017  . Schizophrenia, paranoid type (HCC) [F20.0] 06/08/2017  . 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 Paranoid schizophrenia (HCC) [F20.0] 09/29/2015  . 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 History of posttraumatic stress disorder (PTSD) [Z86.59] 09/26/2015    Total Time spent with patient: 30 minutes  Musculoskeletal: Strength & Muscle Tone: within normal limits Gait & Station: normal Patient leans: N/A  Psychiatric Specialty Exam: Physical Exam  Constitutional: He is oriented to person, place, and time. He appears well-developed and well-nourished.  Respiratory: Effort normal.  Musculoskeletal: Normal range of motion.  Neurological: He is alert and oriented to person, place, and time.  Psychiatric: His speech is delayed and slurred. He is agitated. Thought content is delusional. Cognition and memory are normal. He expresses impulsivity. He exhibits a depressed mood.   Review of Systems  Psychiatric/Behavioral: Positive for depression. Negative for hallucinations, memory loss, substance abuse and suicidal ideas. The patient is not nervous/anxious and does not have insomnia.   All other systems reviewed and are negative.  Blood pressure 117/86, pulse (!) 57, temperature (!) 97.4 F (36.3 C), temperature source Oral, resp. rate 18, height 6' (1.829 m), weight 63.5 kg (140 lb), SpO2 99 %.Body mass index is 18.99 kg/m. General Appearance: Disheveled Eye Contact:  Fair Speech:  Slow and speaks in a low, soft tone Volume:  Decreased Mood:  Anxious, Depressed and Irritable Affect:  Congruent and Depressed Thought Process:  Coherent, Goal  Directed and Linear Orientation:  Full (Time, Place, and Person) Thought Content:  Illogical, Delusions and Rumination Suicidal Thoughts:  No Homicidal Thoughts:  No Memory:  Immediate;   Fair Recent;   Fair Remote;   Fair Judgement:  Poor Insight:  Lacking Psychomotor Activity:  Decreased Concentration:  Concentration: Fair and Attention Span: Fair Recall:  [employer] of Knowledge:  Good Language:  Good Akathisia:  No Handed:  Right AIMS (if indicated):    Assets:  ArchitectCommunication Skills Financial Resources/Insurance Housing ADL's:  Intact Cognition:  WNL   Mental Status Per Nursing Assessment::   On Admission:   disorganized with multiple somatic complaints  Demographic Factors:  Male, Adolescent or young adult, Living alone and Unemployed  Loss Factors: Financial problems/change in socioeconomic status  Historical Factors: Impulsivity  Risk Reduction Factors:   Sense of responsibility to family  Continued Clinical Symptoms:  Depression:   Impulsivity Schizophrenia:   Paranoid or undifferentiated type More than one psychiatric diagnosis Previous Psychiatric Diagnoses and Treatments  Cognitive Features That Contribute To Risk:  Closed-mindedness    Suicide Risk:  Minimal: No identifiable suicidal ideation.  Patients presenting with no risk factors but with morbid ruminations; may be classified as minimal risk based on the severity of the depressive symptoms  Follow-up Information    [name] Follow up.   Specialty:  Behavioral Health Why:  Follow up for out-patient resources  Contact information: [address] [place] [address] [phone]           Plan Of Care/Follow-up recommendations:  Activity:  as tolerated Diet:  Heart Healthy  Laveda AbbeLaurie Britton Parks, NP 08/09/2017, 11:05 AM

## 2017-08-09 NOTE — ED Notes (Signed)
Pt discharged ambulatory [facility].  Pt was in no distress,.  All belongings were returned to patient.

## 2017-08-11 ENCOUNTER — Emergency Department (HOSPITAL_COMMUNITY)
Admission: EM | Admit: 2017-08-11 | Discharge: 2017-08-12 | Disposition: A | Discharge status: Home / Self Care | Payer: Self-pay | Attending: Emergency Medicine | Admitting: Emergency Medicine

## 2017-08-11 DIAGNOSIS — R51 Headache: Secondary | ICD-10-CM | POA: Insufficient documentation

## 2017-08-11 DIAGNOSIS — R519 Headache, unspecified: Secondary | ICD-10-CM

## 2017-08-11 DIAGNOSIS — Z87891 Personal history of nicotine dependence: Secondary | ICD-10-CM | POA: Insufficient documentation

## 2017-08-11 DIAGNOSIS — R110 Nausea: Secondary | ICD-10-CM | POA: Insufficient documentation

## 2017-08-11 DIAGNOSIS — F209 Schizophrenia, unspecified: Secondary | ICD-10-CM | POA: Insufficient documentation

## 2017-08-11 DIAGNOSIS — R109 Unspecified abdominal pain: Secondary | ICD-10-CM | POA: Insufficient documentation

## 2017-08-11 NOTE — ED Triage Notes (Signed)
Patient c/o one episode of emesis today. Speaking in full sentences without difficulty.

## 2017-08-11 NOTE — ED Notes (Signed)
Pt stated, "I pooed this morning and it was burning. Then, I ate a biscuit and threw up the whole biscuit." Pt c/o headache, buttocks pain, and throat pain.

## 2017-08-12 ENCOUNTER — Encounter (HOSPITAL_COMMUNITY): Payer: Self-pay | Admitting: Family Medicine

## 2017-08-12 DIAGNOSIS — M79641 Pain in right hand: Secondary | ICD-10-CM | POA: Insufficient documentation

## 2017-08-12 DIAGNOSIS — Z87891 Personal history of nicotine dependence: Secondary | ICD-10-CM | POA: Insufficient documentation

## 2017-08-12 LAB — CBC WITH DIFFERENTIAL/PLATELET
Basophils Absolute: 0 10*3/uL (ref 0.0–0.1)
Basophils Relative: 0 %
Eosinophils Absolute: 0 10*3/uL (ref 0.0–0.7)
Eosinophils Relative: 0 %
HCT: 40.2 % (ref 39.0–52.0)
Hemoglobin: 13.5 g/dL (ref 13.0–17.0)
LYMPHS ABS: 2.1 10*3/uL (ref 0.7–4.0)
Lymphocytes Relative: 20 %
MCH: 28.7 pg (ref 26.0–34.0)
MCHC: 33.6 g/dL (ref 30.0–36.0)
MCV: 85.4 fL (ref 78.0–100.0)
MONO ABS: 1.3 10*3/uL — AB (ref 0.1–1.0)
MONOS PCT: 12 %
Neutro Abs: 7.2 10*3/uL (ref 1.7–7.7)
Neutrophils Relative %: 68 %
Platelets: 203 10*3/uL (ref 150–400)
RBC: 4.71 MIL/uL (ref 4.22–5.81)
RDW: 13.1 % (ref 11.5–15.5)
WBC: 10.6 10*3/uL — ABNORMAL HIGH (ref 4.0–10.5)

## 2017-08-12 LAB — COMPREHENSIVE METABOLIC PANEL
ALK PHOS: 87 U/L (ref 38–126)
ALT: 14 U/L — ABNORMAL LOW (ref 17–63)
ANION GAP: 9 (ref 5–15)
AST: 27 U/L (ref 15–41)
Albumin: 4.2 g/dL (ref 3.5–5.0)
BILIRUBIN TOTAL: 1.2 mg/dL (ref 0.3–1.2)
BUN: 20 mg/dL (ref 6–20)
CALCIUM: 9.6 mg/dL (ref 8.9–10.3)
CO2: 27 mmol/L (ref 22–32)
Chloride: 105 mmol/L (ref 101–111)
Creatinine, Ser: 1.23 mg/dL (ref 0.61–1.24)
GLUCOSE: 99 mg/dL (ref 65–99)
POTASSIUM: 3.7 mmol/L (ref 3.5–5.1)
Sodium: 141 mmol/L (ref 135–145)
TOTAL PROTEIN: 7.6 g/dL (ref 6.5–8.1)

## 2017-08-12 MED ORDER — PROMETHAZINE HCL 25 MG PO TABS: 25.0000 mg | ORAL_TABLET | Freq: Four times a day (QID) | ORAL | 0 refills | Status: DC | PRN

## 2017-08-12 MED ORDER — ACETAMINOPHEN 325 MG PO TABS
650.0000 mg | ORAL_TABLET | Freq: Once | ORAL | Status: AC
  Administered 2017-08-12: 650 mg via ORAL
  Filled 2017-08-12: qty 2

## 2017-08-12 MED ORDER — METOCLOPRAMIDE HCL 5 MG/ML IJ SOLN
10.0000 mg | Freq: Once | INTRAMUSCULAR | Status: AC
  Administered 2017-08-12: 10 mg via INTRAMUSCULAR
  Filled 2017-08-12: qty 2

## 2017-08-12 NOTE — ED Notes (Signed)
Pt has kept one ginger ale down and is on his second.

## 2017-08-12 NOTE — ED Provider Notes (Signed)
[facility]-EMERGENCY DEPT Provider Note   CSN: [id number] Arrival date & time: 08/11/17  2130     History   Chief Complaint Chief Complaint  Patient presents with  . Emesis    HPI Long Brimage is a 21 y.o. male.  21 year old male with a history of malingering, schizophrenia, seizures presents to the emergency department for evaluation of vomiting.  He states that he ate a biscuit and swallowed it, but felt it come up to the back of his throat and then go back down.  In triage, patient reports the pain being "burning" in nature and that his "esophagus hurts".  Patient later coughed up a large amount of phlegm.  He has not had any hematemesis or hemoptysis.  He is complaining of pain to the right side of his abdomen as well as a generalized headache.  No recent head injury or trauma.  The patient has been seen in the emergency department 51 times in the past 6 months.  He was most recently discharged to [facility] 2 days ago.   The history is provided by the patient. No language interpreter was used.  Emesis      Past Medical History:  Diagnosis Date  . Psychoses (HCC)   . Schizophrenia (HCC)   . Seizures [facility])     Patient Active Problem List   Diagnosis Date Noted  . Schizophrenia (HCC) 06/30/2017  . Schizophrenia, paranoid type (HCC) 06/08/2017  . 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 Psychoses (HCC)   . Paranoid schizophrenia (HCC) 09/29/2015  . Cannabis use disorder, moderate, in early remission (HCC) 09/26/2015  . History of posttraumatic stress disorder (PTSD) 09/26/2015    Past Surgical History:  Procedure Laterality Date  . abd surgery s/p traumatic event    . facial reconstructive surgery    . NO PAST SURGERIES  patient stated he had facial reconstruction surgery as a child   . tumor removed from head          Home Medications    Prior to Admission medications   Medication Sig Start Date End Date Taking? Authorizing  Provider  citalopram (CELEXA) 20 MG tablet Take 1 tablet (20 mg total) by mouth daily. For depression Patient not taking: Reported on 08/11/2017 07/15/17   Armandina Stammer I, NP  fluticasone (FLONASE) 50 MCG/ACT nasal spray Place 2 sprays into both nostrils daily. Patient not taking: Reported on 08/11/2017 08/07/17   Muthersbaugh, Dahlia Client, PA-C  fluticasone [name]) 50 MCG/ACT nasal spray Place 2 sprays into both nostrils daily. Patient not taking: Reported on 08/11/2017 08/07/17   Muthersbaugh, Dahlia Client, PA-C  hydrOXYzine (ATARAX/VISTARIL) 25 MG tablet Take 1 tablet (25 mg total) by mouth every 6 (six) hours as needed for anxiety. Patient not taking: Reported on 08/11/2017 07/14/17   Armandina Stammer I, NP  ibuprofen (ADVIL,MOTRIN) 200 MG tablet Take 2 tablets (400 mg total) by mouth every 6 (six) hours as needed for moderate pain. Patient not taking: Reported on 08/08/2017 07/27/17   Caccavale, Sophia, PA-C  OLANZapine (ZYPREXA) 15 MG tablet Take 2 tablets (30 mg total) by mouth at bedtime. For mood control Patient not taking: Reported on 08/11/2017 07/14/17   Armandina Stammer I, NP  potassium chloride (K-DUR) 10 MEQ tablet Take 1 tablet (10 mEq total) by mouth daily. Patient not taking: Reported on 08/08/2017 07/26/17   Caccavale, Sophia, PA-C  promethazine (PHENERGAN) 25 MG tablet Take 1 tablet (25 mg total) by mouth every 6 (six) hours as needed for nausea or vomiting.  08/12/17   Antony MaduraHumes, Kaysha Parsell, PA-C  traZODone (DESYREL) 100 MG tablet Take 1 tablet 100 mg by mouth at bedtime: For sleep Patient not taking: Reported on 08/11/2017 07/14/17   Sanjuana KavaNwoko, Agnes I, NP    Family History Family History  Problem Relation Age of Onset  . Hypertension Other   . Mental illness Neg Hx     Social History Social History   Tobacco Use  . Smoking status: Former Smoker    Types: Cigarettes  . Smokeless tobacco: Never Used  Substance Use Topics  . Alcohol use: No  . Drug use: No     Allergies   Patient has no  known allergies.   Review of Systems Review of Systems  Gastrointestinal: Positive for vomiting.  Ten systems reviewed and are negative for acute change, except as noted in the HPI.    Physical Exam Updated Vital Signs BP 111/74   Pulse 88   Temp (!) 97.5 F (36.4 C) (Oral)   Resp 18   Ht 6' (1.829 m)   Wt 63.5 kg (140 lb)   SpO2 97%   BMI 18.99 kg/m   Physical Exam  Constitutional: He is oriented to person, place, and time. He appears well-developed and well-nourished. No distress.  Smells strongly of body odor. Wearing old hospital socks.  HENT:  Head: Normocephalic and atraumatic.  Eyes: Conjunctivae and EOM are normal. No scleral icterus.  Neck: Normal range of motion.  Pulmonary/Chest: Effort normal. No stridor. No respiratory distress.  Respirations even and unlabored  Abdominal: Soft. He exhibits no distension and no mass. There is no tenderness. There is no guarding.  No distension or TTP on exam. No rigidity or peritoneal signs.  Musculoskeletal: Normal range of motion.  Neurological: He is alert and oriented to person, place, and time. He exhibits normal muscle tone. Coordination normal.  GCS 15. Patient moving all extremities.  Skin: Skin is warm and dry. No rash noted. He is not diaphoretic. No erythema. No pallor.  Psychiatric: He has a normal mood and affect. His behavior is normal.  Nursing note and vitals reviewed.    ED Treatments / Results  Labs (all labs ordered are listed, but only abnormal results are displayed) Labs Reviewed  CBC WITH DIFFERENTIAL/PLATELET - Abnormal; Notable for the following components:      Result Value   WBC 10.6 (*)    Monocytes Absolute 1.3 (*)    All other components within normal limits  COMPREHENSIVE METABOLIC PANEL - Abnormal; Notable for the following components:   ALT 14 (*)    All other components within normal limits    EKG  EKG Interpretation None       Radiology No results  found.  Procedures Procedures (including critical care time)  Medications Ordered in ED Medications  metoCLOPramide (REGLAN) injection 10 mg (10 mg Intramuscular Given 08/12/17 0130)  acetaminophen (TYLENOL) tablet 650 mg (650 mg Oral Given 08/12/17 0130)     Initial Impression / Assessment and Plan / ED Course  I have reviewed the triage vital signs and the nursing notes.  Pertinent labs & imaging results that were available during my care of the patient were reviewed by me and considered in my medical decision making (see chart for details).     21 year old male presents to the emergency department for evaluation of nausea.  He was given Reglan for this and has since tolerated 2 glasses of ginger ale.  He has a soft abdomen without reproducible tenderness.  No  distention.  Laboratory workup reassuring.  Leukocytosis is mild, but without left shift.  Liver and kidney function preserved.  No electrolyte derangements.  Patient with history of malingering and homelessness.  I have a strong suspicion that this is his motivation for coming to the ED today.  Will discharge with Phenergan to take as needed for nausea.  Return precautions provided. Patient discharged in stable condition with no unaddressed concerns.   Final Clinical Impressions(s) / ED Diagnoses   Final diagnoses:  Nausea  Bad headache    ED Discharge Orders        Ordered    promethazine (PHENERGAN) 25 MG tablet  Every 6 hours PRN     08/12/17 0221       Antony MaduraHumes, Donnica Jarnagin, PA-C 08/12/17 0245    Dione BoozeGlick, David, MD 08/12/17 [phone]

## 2017-08-12 NOTE — ED Triage Notes (Signed)
Patient is experiencing right arm pain that started today. No recent injury. Numbness in the arm and hand.

## 2017-08-12 NOTE — ED Notes (Signed)
Pt stated that he threw up however it did not come out of his mouth. He swallowed it back down into his stomach and it now is causing him pain. He also stated that his esophagus hurts. It started after the incident and is burning in nature.

## 2017-08-13 ENCOUNTER — Emergency Department (HOSPITAL_COMMUNITY)
Admission: EM | Admit: 2017-08-13 | Discharge: 2017-08-13 | Disposition: A | Discharge status: Home / Self Care | Payer: Self-pay | Attending: Emergency Medicine | Admitting: Emergency Medicine

## 2017-08-13 DIAGNOSIS — M79641 Pain in right hand: Secondary | ICD-10-CM

## 2017-08-13 MED ORDER — IBUPROFEN 200 MG PO TABS
600.0000 mg | ORAL_TABLET | Freq: Once | ORAL | Status: AC
  Administered 2017-08-13: 600 mg via ORAL
  Filled 2017-08-13: qty 3

## 2017-08-13 NOTE — ED Provider Notes (Signed)
[facility]-EMERGENCY DEPT Provider Note   CSN: [id number] Arrival date & time: 08/12/17  2134     History   Chief Complaint Chief Complaint  Patient presents with  . Extremity Pain    HPI Collin White is a 21 y.o. male.  Patient is a 21 year old male with past medical history of schizophrenia and homelessness.  He presents today for evaluation ofRight hand pain.  He injured his hand sometime ago in a manner he cannot recall.  He has had ongoing difficulty with it since this time.  He denies any new injury or trauma.  He denies having tried any medication prior to coming here.   The history is provided by the patient.  Extremity Pain  This is a chronic problem. The problem occurs constantly. The problem has not changed since onset.Nothing aggravates the symptoms. Nothing relieves the symptoms. He has tried nothing for the symptoms.    Past Medical History:  Diagnosis Date  . Psychoses (HCC)   . Schizophrenia (HCC)   . Seizures [facility](HCC)     Patient Active Problem List   Diagnosis Date Noted  . Schizophrenia (HCC) 06/30/2017  . Schizophrenia, paranoid type (HCC) 06/08/2017  . 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 Psychoses (HCC)   . Paranoid schizophrenia (HCC) 09/29/2015  . Cannabis use disorder, moderate, in early remission (HCC) 09/26/2015  . History of posttraumatic stress disorder (PTSD) 09/26/2015    Past Surgical History:  Procedure Laterality Date  . abd surgery s/p traumatic event    . facial reconstructive surgery    . NO PAST SURGERIES  patient stated he had facial reconstruction surgery as a child   . tumor removed from head          Home Medications    Prior to Admission medications   Medication Sig Start Date End Date Taking? Authorizing Provider  citalopram (CELEXA) 20 MG tablet Take 1 tablet (20 mg total) by mouth daily. For depression Patient not taking: Reported on 08/11/2017 07/15/17   Armandina StammerNwoko, Agnes I, NP    fluticasone (FLONASE) 50 MCG/ACT nasal spray Place 2 sprays into both nostrils daily. Patient not taking: Reported on 08/11/2017 08/07/17   Muthersbaugh, Dahlia ClientHannah, PA-C  fluticasone [facility](FLONASE) 50 MCG/ACT nasal spray Place 2 sprays into both nostrils daily. Patient not taking: Reported on 08/11/2017 08/07/17   Muthersbaugh, Dahlia ClientHannah, PA-C  hydrOXYzine (ATARAX/VISTARIL) 25 MG tablet Take 1 tablet (25 mg total) by mouth every 6 (six) hours as needed for anxiety. Patient not taking: Reported on 08/11/2017 07/14/17   Armandina StammerNwoko, Agnes I, NP  ibuprofen (ADVIL,MOTRIN) 200 MG tablet Take 2 tablets (400 mg total) by mouth every 6 (six) hours as needed for moderate pain. Patient not taking: Reported on 08/08/2017 07/27/17   Caccavale, Sophia, PA-C  OLANZapine (ZYPREXA) 15 MG tablet Take 2 tablets (30 mg total) by mouth at bedtime. For mood control Patient not taking: Reported on 08/11/2017 07/14/17   Armandina StammerNwoko, Agnes I, NP  potassium chloride (K-DUR) 10 MEQ tablet Take 1 tablet (10 mEq total) by mouth daily. Patient not taking: Reported on 08/08/2017 07/26/17   Caccavale, Sophia, PA-C  promethazine (PHENERGAN) 25 MG tablet Take 1 tablet (25 mg total) by mouth every 6 (six) hours as needed for nausea or vomiting. 08/12/17   Antony MaduraHumes, Kelly, PA-C  traZODone (DESYREL) 100 MG tablet Take 1 tablet 100 mg by mouth at bedtime: For sleep Patient not taking: Reported on 08/11/2017 07/14/17   Sanjuana KavaNwoko, Agnes I, NP    Family History Family  History  Problem Relation Age of Onset  . Hypertension Other   . Mental illness Neg Hx     Social History Social History   Tobacco Use  . Smoking status: Former Smoker    Types: Cigarettes  . Smokeless tobacco: Never Used  Substance Use Topics  . Alcohol use: No  . Drug use: No     Allergies   Patient has no known allergies.   Review of Systems Review of Systems  All other systems reviewed and are negative.    Physical Exam Updated Vital Signs BP 121/75 (BP Location: Left  Arm)   Pulse 87   Temp (!) 97.5 F (36.4 C) (Oral)   Resp 18   Ht 6' (1.829 m)   Wt 63.5 kg (140 lb)   SpO2 95%   BMI 18.99 kg/m   Physical Exam  Constitutional: He appears well-developed and well-nourished. No distress.  HENT:  Head: Normocephalic and atraumatic.  Neck: Normal range of motion. Neck supple.  Musculoskeletal:  The right hand appears grossly normal.  There is no swelling, deformity, or other abnormality.  He has full range of motion of all fingers and motor and sensation is intact to all fingers.  Ulnar and radial pulses are easily palpable and capillary refill is brisk.  Neurological: He is alert.  Skin: Skin is warm and dry. He is not diaphoretic.  Nursing note and vitals reviewed.    ED Treatments / Results  Labs (all labs ordered are listed, but only abnormal results are displayed) Labs Reviewed - No data to display  EKG  EKG Interpretation None       Radiology No results found.  Procedures Procedures (including critical care time)  Medications Ordered in ED Medications - No data to display   Initial Impression / Assessment and Plan / ED Course  I have reviewed the triage vital signs and the nursing notes.  Pertinent labs & imaging results that were available during my care of the patient were reviewed by me and considered in my medical decision making (see chart for details).  Patient with history of homelessness and mental illness presenting with chronic pain in his hand.  He will be placed in a splint and is to follow-up as needed.  The patient presents here very frequently for various complaints.  Final Clinical Impressions(s) / ED Diagnoses   Final diagnoses:  None    ED Discharge Orders    None       Geoffery Lyonselo, Gedalia Mcmillon, MD 08/13/17 [phone]

## 2017-08-13 NOTE — ED Notes (Signed)
Bed: WA26 Expected date:  Expected time:  Means of arrival:  Comments: 

## 2017-08-13 NOTE — Discharge Instructions (Signed)
Ibuprofen 600 mg every 6 hours as needed for pain.  Wear splint as needed for comfort.

## 2017-08-16 ENCOUNTER — Emergency Department (HOSPITAL_COMMUNITY): Payer: Self-pay

## 2017-08-16 ENCOUNTER — Encounter (HOSPITAL_COMMUNITY): Payer: Self-pay | Admitting: Emergency Medicine

## 2017-08-16 ENCOUNTER — Emergency Department (HOSPITAL_COMMUNITY)
Admission: EM | Admit: 2017-08-16 | Discharge: 2017-08-16 | Disposition: A | Discharge status: Home / Self Care | Payer: Self-pay | Attending: Emergency Medicine | Admitting: Emergency Medicine

## 2017-08-16 DIAGNOSIS — M79641 Pain in right hand: Secondary | ICD-10-CM | POA: Insufficient documentation

## 2017-08-16 MED ORDER — IBUPROFEN 200 MG PO TABS
600.0000 mg | ORAL_TABLET | Freq: Once | ORAL | Status: AC
  Administered 2017-08-16: 600 mg via ORAL
  Filled 2017-08-16: qty 3

## 2017-08-16 NOTE — ED Triage Notes (Signed)
Patient c/o right arm pain after "recent fracture."

## 2017-08-16 NOTE — Discharge Instructions (Signed)
Take tylenol or ibuprofen for pain. Wear the splint as needed for comfort. [facility]

## 2017-08-16 NOTE — ED Provider Notes (Signed)
[facility]-EMERGENCY DEPT Provider Note   CSN: [id number] Arrival date & time: 08/16/17  1718     History   Chief Complaint Chief Complaint  Patient presents with  . Arm Pain    HPI Collin White is a 21 y.o. homeless male who presents to the ED with right hand pain. Patient reports that he broke his hand back in September and has been wearing a Velcro thumb spica splint. He now reports over the past few days having pain to the dorsum of the right hand.    HPI  Past Medical History:  Diagnosis Date  . Psychoses (HCC)   . Schizophrenia (HCC)   . Seizures [facility](HCC)     Patient Active Problem List   Diagnosis Date Noted  . Schizophrenia (HCC) 06/30/2017  . Schizophrenia, paranoid type (HCC) 06/08/2017  . 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 Psychoses (HCC)   . Paranoid schizophrenia (HCC) 09/29/2015  . Cannabis use disorder, moderate, in early remission (HCC) 09/26/2015  . History of posttraumatic stress disorder (PTSD) 09/26/2015    Past Surgical History:  Procedure Laterality Date  . abd surgery s/p traumatic event    . facial reconstructive surgery    . NO PAST SURGERIES  patient stated he had facial reconstruction surgery as a child   . tumor removed from head          Home Medications    Prior to Admission medications   Medication Sig Start Date End Date Taking? Authorizing Provider  citalopram (CELEXA) 20 MG tablet Take 1 tablet (20 mg total) by mouth daily. For depression Patient not taking: Reported on 08/11/2017 07/15/17   Armandina StammerNwoko, Agnes I, NP  fluticasone (FLONASE) 50 MCG/ACT nasal spray Place 2 sprays into both nostrils daily. Patient not taking: Reported on 08/11/2017 08/07/17   Muthersbaugh, Dahlia ClientHannah, PA-C  fluticasone [facility](FLONASE) 50 MCG/ACT nasal spray Place 2 sprays into both nostrils daily. Patient not taking: Reported on 08/11/2017 08/07/17   Muthersbaugh, Dahlia ClientHannah, PA-C  hydrOXYzine (ATARAX/VISTARIL) 25 MG tablet Take  1 tablet (25 mg total) by mouth every 6 (six) hours as needed for anxiety. Patient not taking: Reported on 08/11/2017 07/14/17   Armandina StammerNwoko, Agnes I, NP  ibuprofen (ADVIL,MOTRIN) 200 MG tablet Take 2 tablets (400 mg total) by mouth every 6 (six) hours as needed for moderate pain. Patient not taking: Reported on 08/08/2017 07/27/17   Caccavale, Sophia, PA-C  OLANZapine (ZYPREXA) 15 MG tablet Take 2 tablets (30 mg total) by mouth at bedtime. For mood control Patient not taking: Reported on 08/11/2017 07/14/17   Armandina StammerNwoko, Agnes I, NP  potassium chloride (K-DUR) 10 MEQ tablet Take 1 tablet (10 mEq total) by mouth daily. Patient not taking: Reported on 08/08/2017 07/26/17   Caccavale, Sophia, PA-C  promethazine (PHENERGAN) 25 MG tablet Take 1 tablet (25 mg total) by mouth every 6 (six) hours as needed for nausea or vomiting. 08/12/17   Antony MaduraHumes, Kelly, PA-C  traZODone (DESYREL) 100 MG tablet Take 1 tablet 100 mg by mouth at bedtime: For sleep Patient not taking: Reported on 08/11/2017 07/14/17   Sanjuana KavaNwoko, Agnes I, NP    Family History Family History  Problem Relation Age of Onset  . Hypertension Other   . Mental illness Neg Hx     Social History Social History   Tobacco Use  . Smoking status: Former Smoker    Types: Cigarettes  . Smokeless tobacco: Never Used  Substance Use Topics  . Alcohol use: No  . Drug use: No  Allergies   Patient has no known allergies.   Review of Systems Review of Systems  Constitutional: Negative for fever.  HENT: Negative.   Musculoskeletal: Positive for arthralgias.       Right hand pain  Skin: Negative for wound.     Physical Exam Updated Vital Signs BP (!) 119/57 (BP Location: Left Arm)   Pulse 66   Temp 99 F (37.2 C) (Oral)   Resp 18   SpO2 99%   Physical Exam  Constitutional: He appears well-developed and well-nourished. No distress.  HENT:  Head: Normocephalic.  Eyes: EOM are normal.  Neck: Neck supple.  Cardiovascular: Normal rate.    Pulmonary/Chest: Effort normal.  Musculoskeletal: Normal range of motion.       Right hand: He exhibits tenderness. He exhibits normal range of motion, normal capillary refill, no deformity, no laceration and no swelling. Normal sensation noted.  Patient with tenderness to palpation to the dorsum of the right hand with palpation. Radial pulses 2+, adequate circulation.   Neurological: He is alert.  Skin: Skin is warm and dry.  Nursing note and vitals reviewed.    ED Treatments / Results  Labs (all labs ordered are listed, but only abnormal results are displayed) Labs Reviewed - No data to display  Radiology Dg Hand Complete Right  Result Date: 08/16/2017 CLINICAL DATA:  Right hand pain and numbness. Patient had a spasm in September of 2018 and punched ground. EXAM: RIGHT HAND - COMPLETE 3+ VIEW COMPARISON:  04/21/2017 FINDINGS: The hamate fracture seen previously is not identified today, likely due to differences in position. No residual soft tissue swelling. No new fracture or dislocation identified in the right hand. No focal bone lesion or bone destruction. IMPRESSION: No acute bony abnormalities demonstrated. Electronically Signed   By: Burman NievesWilliam  Stevens M.D.   On: 08/16/2017 18:51    Procedures Procedures (including critical care time)  Medications Ordered in ED Medications  ibuprofen (ADVIL,MOTRIN) tablet 600 mg (600 mg Oral Given 08/16/17 1906)     Initial Impression / Assessment and Plan / ED Course  I have reviewed the triage vital signs and the nursing notes. 21 y.o. male with right hand pain stable for d/c without acute findings on x-ray and no focal neuro deficits. Discussed with the patient results of x-ray and that he may have had the splint to tight on his hand and that he can d/c wearing the splint if it is more comfortable. Patient will take tylenol or ibuprofen as needed. Return precautions discussed.   Final Clinical Impressions(s) / ED Diagnoses   Final  diagnoses:  Right hand pain    ED Discharge Orders    None       Kerrie Buffaloeese, Hope IndustryM, [place]NP 08/16/17 [phone]    Rolan BuccoBelfi, Melanie, MD 08/16/17 2354

## 2017-08-18 ENCOUNTER — Encounter (HOSPITAL_COMMUNITY): Payer: Self-pay

## 2017-08-18 DIAGNOSIS — Z87891 Personal history of nicotine dependence: Secondary | ICD-10-CM | POA: Insufficient documentation

## 2017-08-18 DIAGNOSIS — R51 Headache: Secondary | ICD-10-CM | POA: Insufficient documentation

## 2017-08-18 DIAGNOSIS — Z79899 Other long term (current) drug therapy: Secondary | ICD-10-CM | POA: Insufficient documentation

## 2017-08-18 NOTE — ED Triage Notes (Signed)
Pt complains of a headache for two weeks with no relief from tylenol or motrin

## 2017-08-19 ENCOUNTER — Emergency Department (HOSPITAL_COMMUNITY)
Admission: EM | Admit: 2017-08-19 | Discharge: 2017-08-19 | Disposition: A | Discharge status: Home / Self Care | Payer: Self-pay | Attending: Emergency Medicine | Admitting: Emergency Medicine

## 2017-08-19 DIAGNOSIS — R51 Headache: Secondary | ICD-10-CM

## 2017-08-19 DIAGNOSIS — R519 Headache, unspecified: Secondary | ICD-10-CM

## 2017-08-19 MED ORDER — KETOROLAC TROMETHAMINE 60 MG/2ML IM SOLN
60.0000 mg | Freq: Once | INTRAMUSCULAR | Status: AC
  Administered 2017-08-19: 60 mg via INTRAMUSCULAR
  Filled 2017-08-19: qty 2

## 2017-08-19 NOTE — Discharge Instructions (Signed)
Return here as needed. Follow up with a primary doctor. °

## 2017-08-19 NOTE — ED Provider Notes (Signed)
[facility]-EMERGENCY DEPT Provider Note   CSN: [id number] Arrival date & time: 08/18/17  2227     History   Chief Complaint Chief Complaint  Patient presents with  . Headache    HPI Collin White is a 22 y.o. male.  HPI Patient presents to the emergency department with with a complaint of right-sided headache.  The patient states this started several days ago the patient is a very poor historian.  Patient is giving me history of things that are not related to the headache.  Patient speaks very vaguely about the headache he does state that it is a dull headache.  States nothing seems to make the condition better or worse patient states he did not take any medications prior to arrival.  The patient states ibuprofen makes him feel like the headache is moving to his chest. Past Medical History:  Diagnosis Date  . Psychoses (HCC)   . Schizophrenia (HCC)   . Seizures [facility](HCC)     Patient Active Problem List   Diagnosis Date Noted  . Schizophrenia (HCC) 06/30/2017  . Schizophrenia, paranoid type (HCC) 06/08/2017  . 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 Psychoses (HCC)   . Paranoid schizophrenia (HCC) 09/29/2015  . Cannabis use disorder, moderate, in early remission (HCC) 09/26/2015  . History of posttraumatic stress disorder (PTSD) 09/26/2015    Past Surgical History:  Procedure Laterality Date  . abd surgery s/p traumatic event    . facial reconstructive surgery    . NO PAST SURGERIES  patient stated he had facial reconstruction surgery as a child   . tumor removed from head          Home Medications    Prior to Admission medications   Medication Sig Start Date End Date Taking? Authorizing Provider  citalopram (CELEXA) 20 MG tablet Take 1 tablet (20 mg total) by mouth daily. For depression Patient not taking: Reported on 08/11/2017 07/15/17   Armandina StammerNwoko, Agnes I, NP  fluticasone (FLONASE) 50 MCG/ACT nasal spray Place 2 sprays into both  nostrils daily. Patient not taking: Reported on 08/11/2017 08/07/17   Muthersbaugh, Dahlia ClientHannah, PA-C  fluticasone [facility](FLONASE) 50 MCG/ACT nasal spray Place 2 sprays into both nostrils daily. Patient not taking: Reported on 08/11/2017 08/07/17   Muthersbaugh, Dahlia ClientHannah, PA-C  hydrOXYzine (ATARAX/VISTARIL) 25 MG tablet Take 1 tablet (25 mg total) by mouth every 6 (six) hours as needed for anxiety. Patient not taking: Reported on 08/11/2017 07/14/17   Armandina StammerNwoko, Agnes I, NP  ibuprofen (ADVIL,MOTRIN) 200 MG tablet Take 2 tablets (400 mg total) by mouth every 6 (six) hours as needed for moderate pain. Patient not taking: Reported on 08/08/2017 07/27/17   Caccavale, Sophia, PA-C  OLANZapine (ZYPREXA) 15 MG tablet Take 2 tablets (30 mg total) by mouth at bedtime. For mood control Patient not taking: Reported on 08/11/2017 07/14/17   Armandina StammerNwoko, Agnes I, NP  potassium chloride (K-DUR) 10 MEQ tablet Take 1 tablet (10 mEq total) by mouth daily. Patient not taking: Reported on 08/08/2017 07/26/17   Caccavale, Sophia, PA-C  promethazine (PHENERGAN) 25 MG tablet Take 1 tablet (25 mg total) by mouth every 6 (six) hours as needed for nausea or vomiting. Patient not taking: Reported on 08/19/2017 08/12/17   Antony MaduraHumes, Kelly, PA-C  traZODone (DESYREL) 100 MG tablet Take 1 tablet 100 mg by mouth at bedtime: For sleep Patient not taking: Reported on 08/11/2017 07/14/17   Sanjuana KavaNwoko, Agnes I, NP    Family History Family History  Problem Relation Age of Onset  .  Hypertension Other   . Mental illness Neg Hx     Social History Social History   Tobacco Use  . Smoking status: Former Smoker    Types: Cigarettes  . Smokeless tobacco: Never Used  Substance Use Topics  . Alcohol use: No  . Drug use: No     Allergies   Patient has no known allergies.   Review of Systems Review of Systems Level  5 caveat applies due to poor historian  Physical Exam Updated Vital Signs BP 131/64 (BP Location: Right Arm)   Pulse 68   Temp (!)  97.5 F (36.4 C) (Oral)   Resp 18   SpO2 100%   Physical Exam  Constitutional: He is oriented to person, place, and time. He appears well-developed and well-nourished. No distress.  HENT:  Head: Normocephalic and atraumatic.  Mouth/Throat: Oropharynx is clear and moist.  Eyes: Pupils are equal, round, and reactive to light.  Neck: Normal range of motion. Neck supple.  Cardiovascular: Normal rate, regular rhythm and normal heart sounds. Exam reveals no gallop and no friction rub.  No murmur heard. Pulmonary/Chest: Effort normal and breath sounds normal. No respiratory distress. He has no wheezes.  Abdominal: Soft. Bowel sounds are normal. He exhibits no distension. There is no tenderness.  Neurological: He is alert and oriented to person, place, and time. He has normal strength. He exhibits normal muscle tone. Coordination and gait normal. GCS eye subscore is 4. GCS verbal subscore is 5. GCS motor subscore is 6.  Skin: Skin is warm and dry. Capillary refill takes less than 2 seconds. No rash noted. No erythema.  Psychiatric: He has a normal mood and affect. His behavior is normal.  Nursing note and vitals reviewed.    ED Treatments / Results  Labs (all labs ordered are listed, but only abnormal results are displayed) Labs Reviewed - No data to display  EKG  EKG Interpretation None       Radiology No results found.  Procedures Procedures (including critical care time)  Medications Ordered in ED Medications  ketorolac (TORADOL) injection 60 mg (60 mg Intramuscular Given 08/19/17 0229)     Initial Impression / Assessment and Plan / ED Course  I have reviewed the triage vital signs and the nursing notes.  Pertinent labs & imaging results that were available during my care of the patient were reviewed by me and considered in my medical decision making (see chart for details).    Patient will be treated for simple headache.  The patient does not have any neurological  deficits.  Patient is given treatment for his headache and has been resting since that time.   Final Clinical Impressions(s) / ED Diagnoses   Final diagnoses:  None    ED Discharge Orders    None       Charlestine Night, PA-C 08/19/17 0636    Molpus, Jonny Ruiz, MD 08/19/17 [phone]

## 2017-10-31 DIAGNOSIS — R51 Headache: Secondary | ICD-10-CM | POA: Insufficient documentation

## 2017-10-31 DIAGNOSIS — Z79899 Other long term (current) drug therapy: Secondary | ICD-10-CM | POA: Insufficient documentation

## 2017-10-31 DIAGNOSIS — Z87891 Personal history of nicotine dependence: Secondary | ICD-10-CM | POA: Insufficient documentation

## 2017-11-01 ENCOUNTER — Other Ambulatory Visit: Payer: Self-pay

## 2017-11-01 ENCOUNTER — Encounter (HOSPITAL_COMMUNITY): Payer: Self-pay | Admitting: Emergency Medicine

## 2017-11-01 ENCOUNTER — Emergency Department (HOSPITAL_COMMUNITY)
Admission: EM | Admit: 2017-11-01 | Discharge: 2017-11-01 | Disposition: A | Discharge status: Home / Self Care | Payer: Self-pay | Attending: Emergency Medicine | Admitting: Emergency Medicine

## 2017-11-01 DIAGNOSIS — R519 Headache, unspecified: Secondary | ICD-10-CM

## 2017-11-01 DIAGNOSIS — R51 Headache: Secondary | ICD-10-CM

## 2017-11-01 MED ORDER — KETOROLAC TROMETHAMINE 60 MG/2ML IM SOLN
60.0000 mg | Freq: Once | INTRAMUSCULAR | Status: AC
  Administered 2017-11-01: 60 mg via INTRAMUSCULAR
  Filled 2017-11-01: qty 2

## 2017-11-01 NOTE — ED Triage Notes (Signed)
Pt comes in complaining of right sided headache that wraps around to his eye, Patient always has head pain but it got worse today.

## 2017-11-01 NOTE — ED Notes (Signed)
Pt c/o 9/10 headache that is causing intermittent pain to both eyes. Also, c/o lower back pain after walking.

## 2017-11-01 NOTE — Discharge Instructions (Signed)
Tylenol 1000 mg rotated with ibuprofen 600 mg every 4 hours as needed for pain.  Follow-up with your primary doctor if not improving.

## 2017-11-01 NOTE — ED Provider Notes (Signed)
[facility]-EMERGENCY DEPT Provider Note   CSN: [id number] Arrival date & time: 10/31/17  2326     History   Chief Complaint Chief Complaint  Patient presents with  . Headache    HPI Collin White is a 22 y.o. male.  This patient is a 22 year old male with past medical history of schizophrenia, psychosis, seizures and recurrent headaches presenting for evaluation of headache.  This started 3 hours prior to presentation.  He has not tried taking anything for this.  He denies any nausea or vomiting.  He denies any blurry vision or double vision.  The pain he states is located to the right temple and radiates behind his right eye.   The history is provided by the patient.  Headache   This is a recurrent problem. Episode onset: 3 hours prior to presentation. The problem occurs constantly. The problem has not changed since onset.The headache is associated with nothing. The pain is located in the right unilateral region. The quality of the pain is described as throbbing. The pain is moderate. The pain does not radiate. He has tried nothing for the symptoms.    Past Medical History:  Diagnosis Date  . Psychoses (HCC)   . Schizophrenia (HCC)   . Seizures [facility])     Patient Active Problem List   Diagnosis Date Noted  . Schizophrenia (HCC) 06/30/2017  . Schizophrenia, paranoid type (HCC) 06/08/2017  . 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 Psychoses (HCC)   . Paranoid schizophrenia (HCC) 09/29/2015  . Cannabis use disorder, moderate, in early remission (HCC) 09/26/2015  . History of posttraumatic stress disorder (PTSD) 09/26/2015    Past Surgical History:  Procedure Laterality Date  . abd surgery s/p traumatic event    . facial reconstructive surgery    . NO PAST SURGERIES  patient stated he had facial reconstruction surgery as a child   . tumor removed from head          Home Medications    Prior to Admission medications   Medication  Sig Start Date End Date Taking? Authorizing Provider  citalopram (CELEXA) 20 MG tablet Take 1 tablet (20 mg total) by mouth daily. For depression Patient not taking: Reported on 08/11/2017 07/15/17   Armandina Stammer I, NP  fluticasone (FLONASE) 50 MCG/ACT nasal spray Place 2 sprays into both nostrils daily. Patient not taking: Reported on 08/11/2017 08/07/17   Muthersbaugh, Dahlia Client, PA-C  fluticasone [facility]) 50 MCG/ACT nasal spray Place 2 sprays into both nostrils daily. Patient not taking: Reported on 08/11/2017 08/07/17   Muthersbaugh, Dahlia Client, PA-C  hydrOXYzine (ATARAX/VISTARIL) 25 MG tablet Take 1 tablet (25 mg total) by mouth every 6 (six) hours as needed for anxiety. Patient not taking: Reported on 08/11/2017 07/14/17   Armandina Stammer I, NP  ibuprofen (ADVIL,MOTRIN) 200 MG tablet Take 2 tablets (400 mg total) by mouth every 6 (six) hours as needed for moderate pain. Patient not taking: Reported on 08/08/2017 07/27/17   Caccavale, Sophia, PA-C  OLANZapine (ZYPREXA) 15 MG tablet Take 2 tablets (30 mg total) by mouth at bedtime. For mood control Patient not taking: Reported on 08/11/2017 07/14/17   Armandina Stammer I, NP  potassium chloride (K-DUR) 10 MEQ tablet Take 1 tablet (10 mEq total) by mouth daily. Patient not taking: Reported on 08/08/2017 07/26/17   Caccavale, Sophia, PA-C  promethazine (PHENERGAN) 25 MG tablet Take 1 tablet (25 mg total) by mouth every 6 (six) hours as needed for nausea or vomiting. Patient not taking: Reported on  08/19/2017 08/12/17   Antony MaduraHumes, Kelly, PA-C  traZODone (DESYREL) 100 MG tablet Take 1 tablet 100 mg by mouth at bedtime: For sleep Patient not taking: Reported on 08/11/2017 07/14/17   Sanjuana KavaNwoko, Agnes I, NP    Family History Family History  Problem Relation Age of Onset  . Hypertension Other   . Mental illness Neg Hx     Social History Social History   Tobacco Use  . Smoking status: Former Smoker    Types: Cigarettes  . Smokeless tobacco: Never Used    Substance Use Topics  . Alcohol use: No  . Drug use: No     Allergies   Patient has no known allergies.   Review of Systems Review of Systems  Neurological: Positive for headaches.  All other systems reviewed and are negative.    Physical Exam Updated Vital Signs BP 119/86   Pulse 71   Temp 98.4 F (36.9 C) (Oral)   Resp 16   Ht 6' (1.829 m)   Wt 63.5 kg (140 lb 1.6 oz)   SpO2 99%   BMI 19.00 kg/m   Physical Exam  Constitutional: He is oriented to person, place, and time. He appears well-developed and well-nourished. No distress.  HENT:  Head: Normocephalic and atraumatic.  Mouth/Throat: Oropharynx is clear and moist.  Eyes: EOM are normal. Pupils are equal, round, and reactive to light.  Neck: Normal range of motion. Neck supple.  Cardiovascular: Normal rate and regular rhythm. Exam reveals no friction rub.  No murmur heard. Pulmonary/Chest: Effort normal and breath sounds normal. No respiratory distress. He has no wheezes. He has no rales.  Abdominal: Soft. Bowel sounds are normal. He exhibits no distension. There is no tenderness.  Musculoskeletal: Normal range of motion. He exhibits no edema.  Neurological: He is alert and oriented to person, place, and time. He has normal strength. He is not disoriented. No cranial nerve deficit.  Skin: Skin is warm and dry. He is not diaphoretic.  Nursing note and vitals reviewed.    ED Treatments / Results  Labs (all labs ordered are listed, but only abnormal results are displayed) Labs Reviewed - No data to display  EKG  EKG Interpretation None       Radiology No results found.  Procedures Procedures (including critical care time)  Medications Ordered in ED Medications  ketorolac (TORADOL) injection 60 mg (not administered)     Initial Impression / Assessment and Plan / ED Course  I have reviewed the triage vital signs and the nursing notes.  Pertinent labs & imaging results that were available  during my care of the patient were reviewed by me and considered in my medical decision making (see chart for details).  Patient well-known to the emergency department presenting with complaints of headache.  He is neurologically intact and I see no indication for any imaging studies.  He was given Toradol here and will be discharged.  To return as needed.  Final Clinical Impressions(s) / ED Diagnoses   Final diagnoses:  None    ED Discharge Orders    None       Geoffery Lyonselo, Deleah Tison, MD 11/01/17 [phone]

## 2017-11-09 ENCOUNTER — Emergency Department (HOSPITAL_COMMUNITY)
Admission: EM | Admit: 2017-11-09 | Discharge: 2017-11-09 | Disposition: A | Discharge status: Home / Self Care | Payer: Self-pay | Attending: Emergency Medicine | Admitting: Emergency Medicine

## 2017-11-09 ENCOUNTER — Encounter (HOSPITAL_COMMUNITY): Payer: Self-pay | Admitting: Emergency Medicine

## 2017-11-09 DIAGNOSIS — F17210 Nicotine dependence, cigarettes, uncomplicated: Secondary | ICD-10-CM | POA: Insufficient documentation

## 2017-11-09 DIAGNOSIS — R51 Headache: Secondary | ICD-10-CM | POA: Insufficient documentation

## 2017-11-09 DIAGNOSIS — R519 Headache, unspecified: Secondary | ICD-10-CM

## 2017-11-09 MED ORDER — ACETAMINOPHEN 325 MG PO TABS
650.0000 mg | ORAL_TABLET | Freq: Once | ORAL | Status: AC
  Administered 2017-11-09: 650 mg via ORAL
  Filled 2017-11-09: qty 2

## 2017-11-09 MED ORDER — KETOROLAC TROMETHAMINE 30 MG/ML IJ SOLN
30.0000 mg | Freq: Once | INTRAMUSCULAR | Status: AC
  Administered 2017-11-09: 30 mg via INTRAMUSCULAR
  Filled 2017-11-09: qty 1

## 2017-11-09 NOTE — ED Triage Notes (Addendum)
Patient c/o headache x2 hours. Denies N/V/D. Ambulatory. States Tylenol generally relieves pain. Reports he has not taken any today.

## 2017-11-09 NOTE — ED Provider Notes (Signed)
[facility]-EMERGENCY DEPT Provider Note   CSN: [id number] Arrival date & time: 11/09/17  1837     History   Chief Complaint Chief Complaint  Patient presents with  . Headache    HPI Collin White is a 22 y.o. male.  The history is provided by the patient and medical records. No language interpreter was used.  Headache   Pertinent negatives include no fever.   Collin White is a 22 y.o. male  with a PMH of headaches who presents to the Emergency Department complaining of throbbing right-sided headache consistent with his typical headaches.  Pain began this afternoon a few hours ago.  He typically tries Tylenol which will help, however he has taken no medication today for his symptoms.  Associated with some photophobia.  No phonophobia, visual changes, fever, chills, neck pain.    Past Medical History:  Diagnosis Date  . Psychoses (HCC)   . Schizophrenia (HCC)   . Seizures [facility])     Patient Active Problem List   Diagnosis Date Noted  . Schizophrenia (HCC) 06/30/2017  . Schizophrenia, paranoid type (HCC) 06/08/2017  . 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 Psychoses (HCC)   . Paranoid schizophrenia (HCC) 09/29/2015  . Cannabis use disorder, moderate, in early remission (HCC) 09/26/2015  . History of posttraumatic stress disorder (PTSD) 09/26/2015    Past Surgical History:  Procedure Laterality Date  . abd surgery s/p traumatic event    . facial reconstructive surgery    . NO PAST SURGERIES  patient stated he had facial reconstruction surgery as a child   . tumor removed from head           Home Medications    Prior to Admission medications   Medication Sig Start Date End Date Taking? Authorizing Provider  citalopram (CELEXA) 20 MG tablet Take 1 tablet (20 mg total) by mouth daily. For depression Patient not taking: Reported on 08/11/2017 07/15/17   Armandina Stammer I, NP  fluticasone (FLONASE) 50 MCG/ACT nasal spray Place 2  sprays into both nostrils daily. Patient not taking: Reported on 08/11/2017 08/07/17   Muthersbaugh, Dahlia Client, PA-C  fluticasone [facility]) 50 MCG/ACT nasal spray Place 2 sprays into both nostrils daily. Patient not taking: Reported on 08/11/2017 08/07/17   Muthersbaugh, Dahlia Client, PA-C  hydrOXYzine (ATARAX/VISTARIL) 25 MG tablet Take 1 tablet (25 mg total) by mouth every 6 (six) hours as needed for anxiety. Patient not taking: Reported on 08/11/2017 07/14/17   Armandina Stammer I, NP  ibuprofen (ADVIL,MOTRIN) 200 MG tablet Take 2 tablets (400 mg total) by mouth every 6 (six) hours as needed for moderate pain. Patient not taking: Reported on 08/08/2017 07/27/17   Caccavale, Sophia, PA-C  OLANZapine (ZYPREXA) 15 MG tablet Take 2 tablets (30 mg total) by mouth at bedtime. For mood control Patient not taking: Reported on 08/11/2017 07/14/17   Armandina Stammer I, NP  potassium chloride (K-DUR) 10 MEQ tablet Take 1 tablet (10 mEq total) by mouth daily. Patient not taking: Reported on 08/08/2017 07/26/17   Caccavale, Sophia, PA-C  promethazine (PHENERGAN) 25 MG tablet Take 1 tablet (25 mg total) by mouth every 6 (six) hours as needed for nausea or vomiting. Patient not taking: Reported on 08/19/2017 08/12/17   Antony Madura, PA-C  traZODone (DESYREL) 100 MG tablet Take 1 tablet 100 mg by mouth at bedtime: For sleep Patient not taking: Reported on 08/11/2017 07/14/17   Sanjuana Kava, NP    Family History Family History  Problem Relation Age of Onset  .  Hypertension Other   . Mental illness Neg Hx     Social History Social History   Tobacco Use  . Smoking status: Former Smoker    Types: Cigarettes  . Smokeless tobacco: Never Used  Substance Use Topics  . Alcohol use: No  . Drug use: No     Allergies   Patient has no known allergies.   Review of Systems Review of Systems  Constitutional: Negative for chills and fever.  Eyes: Negative for visual disturbance.  Musculoskeletal: Negative for neck  pain.  Neurological: Positive for headaches. Negative for dizziness, syncope, weakness and numbness.     Physical Exam Updated Vital Signs BP 135/78   Pulse 76   Temp 98.5 F (36.9 C) (Oral)   Resp 16   SpO2 100%   Physical Exam  Constitutional: He is oriented to person, place, and time. He appears well-developed and well-nourished. No distress.  HENT:  Head: Normocephalic and atraumatic.  Mouth/Throat: Oropharynx is clear and moist.  No tenderness of the temporal artery   Eyes: Pupils are equal, round, and reactive to light. Conjunctivae and EOM are normal. No scleral icterus.  No nystagmus   Neck: Normal range of motion. Neck supple.  Full active and passive ROM without pain.  No midline or paraspinal tenderness. No nuchal rigidity or meningeal signs.  Cardiovascular: Normal rate, regular rhythm, normal heart sounds and intact distal pulses.  Pulmonary/Chest: Effort normal and breath sounds normal. No respiratory distress. He has no wheezes. He has no rales.  Abdominal: Soft. Bowel sounds are normal. He exhibits no distension. There is no tenderness. There is no rebound and no guarding.  Musculoskeletal: Normal range of motion.  Lymphadenopathy:    He has no cervical adenopathy.  Neurological: He is alert and oriented to person, place, and time. He has normal reflexes. No cranial nerve deficit. Coordination normal.  Alert, oriented, thought content appropriate, able to give a coherent history. Speech is clear and goal oriented, able to follow commands.  Cranial Nerves:  II:  Peripheral visual fields grossly normal, pupils equal, round, reactive to light III, IV, VI: EOM intact bilaterally, ptosis not present V,VII: smile symmetric, eyes kept closed tightly against resistance, facial light touch sensation equal VIII: hearing grossly normal IX, X: symmetric soft palate movement, uvula elevates symmetrically  XI: bilateral shoulder shrug symmetric and strong XII: midline  tongue extension 5/5 muscle strength in upper and lower extremities bilaterally including strong and equal grip strength and dorsiflexion/plantar flexion Sensory to light touch normal in all four extremities.  Normal finger-to-nose and rapid alternating movements. No drift. Steady gait.   Skin: Skin is warm and dry. No rash noted. He is not diaphoretic.  Nursing note and vitals reviewed.    ED Treatments / Results  Labs (all labs ordered are listed, but only abnormal results are displayed) Labs Reviewed - No data to display  EKG None  Radiology No results found.  Procedures Procedures (including critical care time)  Medications Ordered in ED Medications  ketorolac (TORADOL) 30 MG/ML injection 30 mg (30 mg Intramuscular Given 11/09/17 2229)  acetaminophen (TYLENOL) tablet 650 mg (650 mg Oral Given 11/09/17 2229)     Initial Impression / Assessment and Plan / ED Course  I have reviewed the triage vital signs and the nursing notes.  Pertinent labs & imaging results that were available during my care of the patient were reviewed by me and considered in my medical decision making (see chart for details).    [name]  White is a 22 y.o. male who presents to ED for headache c/w their typical headaches. No focal neuro deficits on exam. Toradol/tylenol given in ED  The patient denies any neurologic symptoms such as visual changes, focal numbness/weakness, balance problems, confusion, or speech difficulty to suggest a life-threatening intracranial process such as intracranial hemorrhage or mass. The patient has no clotting risk factors thus venous sinus thrombosis is unlikely. No fevers, neck pain or nuchal rigidity to suggest meningitis. I feel that the patient is safe for discharge home at this time. PCP follow up strongly encouraged. I have reviewed return precautions including development of neurologic symptoms, confusion, lethargy, difficulty speaking, or new/worsening/concerning  symptoms. All questions answered.   Final Clinical Impressions(s) / ED Diagnoses   Final diagnoses:  None    ED Discharge Orders    None       Ward, Chase Picket, PA-C 11/09/17 2231    Rolan Bucco, MD 11/09/17 [phone]

## 2017-11-09 NOTE — Discharge Instructions (Signed)
It was my pleasure taking care of you today!  Drink plenty of fluids at home. This will help with your headache.  Please follow up with your primary doctor.    Fortunately, your evaluation today is reassuring with no apparent emergent cause for your headache at this time. With that being said, it is VERY important that you monitor your symptoms at home. If you develop worsening headache, new fever, new neck stiffness, rash, weakness, numbness, trouble with your speech, trouble walking, new or worsening symptoms or any concerning symptoms, please return to the ED immediately.

## 2017-11-23 ENCOUNTER — Encounter (HOSPITAL_COMMUNITY): Payer: Self-pay

## 2017-11-23 ENCOUNTER — Emergency Department (HOSPITAL_COMMUNITY)
Admission: EM | Admit: 2017-11-23 | Discharge: 2017-11-23 | Disposition: A | Discharge status: Home / Self Care | Payer: Self-pay | Attending: Emergency Medicine | Admitting: Emergency Medicine

## 2017-11-23 ENCOUNTER — Other Ambulatory Visit: Payer: Self-pay

## 2017-11-23 DIAGNOSIS — Z87891 Personal history of nicotine dependence: Secondary | ICD-10-CM | POA: Insufficient documentation

## 2017-11-23 DIAGNOSIS — R51 Headache: Secondary | ICD-10-CM | POA: Insufficient documentation

## 2017-11-23 DIAGNOSIS — R519 Headache, unspecified: Secondary | ICD-10-CM

## 2017-11-23 DIAGNOSIS — Z79899 Other long term (current) drug therapy: Secondary | ICD-10-CM | POA: Insufficient documentation

## 2017-11-23 MED ORDER — ACETAMINOPHEN 500 MG PO TABS
1000.0000 mg | ORAL_TABLET | Freq: Once | ORAL | Status: AC
  Administered 2017-11-23: 1000 mg via ORAL
  Filled 2017-11-23: qty 2

## 2017-11-23 MED ORDER — KETOROLAC TROMETHAMINE 60 MG/2ML IM SOLN
60.0000 mg | Freq: Once | INTRAMUSCULAR | Status: AC
  Administered 2017-11-23: 60 mg via INTRAMUSCULAR
  Filled 2017-11-23: qty 2

## 2017-11-23 NOTE — Discharge Instructions (Signed)
Take 1000 mg acetaminophen (tylenol) plus 600 mg ibuprofen (aleve, advil) every 6-8 hours.   Return for fevers, neck pain or stiffness, vision changes, vomiting, one sided numbness weakness, facial drooping, speech or gait difficulty

## 2017-11-23 NOTE — ED Provider Notes (Signed)
[facility]   CSN: [id number] Arrival date & time: 11/23/17  1356     History   Chief Complaint Chief Complaint  Patient presents with  . Headache    HPI Collin White is a 22 y.o. male w h/o headaches and schizophrenia here for evaluation of headache onset last night. Headache described as pressure located to right side of head associated with nausea and light sensitivity. Took tylenol last night and headache improved and was able to go to bed, woke up this morning with the headache again. H/o head injury at age 89.    No fevers, chills, vision changes, vomiting, head trauma, seizures, difficulty with speech or gait.   W/ h/o headaches, childhood TBI,      Past Medical History:  Diagnosis Date  . Psychoses (HCC)   . Schizophrenia (HCC)   . Seizures [name](HCC)     Patient Active Problem List   Diagnosis Date Noted  . Schizophrenia (HCC) 06/30/2017  . Schizophrenia, paranoid type (HCC) 06/08/2017  . 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 Psychoses (HCC)   . Paranoid schizophrenia (HCC) 09/29/2015  . Cannabis use disorder, moderate, in early remission (HCC) 09/26/2015  . History of posttraumatic stress disorder (PTSD) 09/26/2015    Past Surgical History:  Procedure Laterality Date  . abd surgery s/p traumatic event    . facial reconstructive surgery    . NO PAST SURGERIES  patient stated he had facial reconstruction surgery as a child   . tumor removed from head           Home Medications    Prior to Admission medications   Medication Sig Start Date End Date Taking? Authorizing Provider  citalopram (CELEXA) 20 MG tablet Take 1 tablet (20 mg total) by mouth daily. For depression Patient not taking: Reported on 08/11/2017 07/15/17   Armandina StammerNwoko, Agnes I, NP  fluticasone (FLONASE) 50 MCG/ACT nasal spray Place 2 sprays into both nostrils daily. Patient not taking: Reported on 08/11/2017 08/07/17    Muthersbaugh, Dahlia ClientHannah, PA-C  fluticasone [facility](FLONASE) 50 MCG/ACT nasal spray Place 2 sprays into both nostrils daily. Patient not taking: Reported on 08/11/2017 08/07/17   Muthersbaugh, Dahlia ClientHannah, PA-C  hydrOXYzine (ATARAX/VISTARIL) 25 MG tablet Take 1 tablet (25 mg total) by mouth every 6 (six) hours as needed for anxiety. Patient not taking: Reported on 08/11/2017 07/14/17   Armandina StammerNwoko, Agnes I, NP  ibuprofen (ADVIL,MOTRIN) 200 MG tablet Take 2 tablets (400 mg total) by mouth every 6 (six) hours as needed for moderate pain. Patient not taking: Reported on 08/08/2017 07/27/17   Caccavale, Sophia, PA-C  OLANZapine (ZYPREXA) 15 MG tablet Take 2 tablets (30 mg total) by mouth at bedtime. For mood control Patient not taking: Reported on 08/11/2017 07/14/17   Armandina StammerNwoko, Agnes I, NP  potassium chloride (K-DUR) 10 MEQ tablet Take 1 tablet (10 mEq total) by mouth daily. Patient not taking: Reported on 08/08/2017 07/26/17   Caccavale, Sophia, PA-C  promethazine (PHENERGAN) 25 MG tablet Take 1 tablet (25 mg total) by mouth every 6 (six) hours as needed for nausea or vomiting. Patient not taking: Reported on 08/19/2017 08/12/17   Antony MaduraHumes, Kelly, PA-C  traZODone (DESYREL) 100 MG tablet Take 1 tablet 100 mg by mouth at bedtime: For sleep Patient not taking: Reported on 08/11/2017 07/14/17   Sanjuana KavaNwoko, Agnes I, NP    Family History Family History  Problem Relation Age of Onset  . Hypertension Other   . Mental illness Neg Hx  Social History Social History   Tobacco Use  . Smoking status: Former Smoker    Types: Cigarettes  . Smokeless tobacco: Never Used  Substance Use Topics  . Alcohol use: No  . Drug use: No     Allergies   Patient has no known allergies.   Review of Systems Review of Systems  Eyes: Positive for photophobia.  Gastrointestinal: Positive for nausea.  Neurological: Positive for headaches.  All other systems reviewed and are negative.    Physical Exam Updated Vital Signs BP 120/85 (BP  Location: Right Arm)   Pulse 64   Temp 98.2 F (36.8 C) (Oral)   Resp 14   Ht 5\' 11"  (1.803 m)   Wt 63.5 kg (140 lb)   SpO2 100%   BMI 19.53 kg/m   Physical Exam  Constitutional: He appears well-developed.  Non-toxic appearance.  NAD.  HENT:  Head: Normocephalic and atraumatic.  Right Ear: External ear normal.  Left Ear: External ear normal.  Nose: Nose normal. No mucosal edema or septal deviation.  Moist mucous membranes Uvula midline Oropharynx and tonsils normal No tenderness over temporal arteries  Eyes: Conjunctivae and lids are normal.  Unable to visualize back of eye  Neck:  No c spine spinous process or muscular tenderness  Full PROM of neck w/o rigidity  No meningeal signs   Cardiovascular: Normal rate, regular rhythm and normal heart sounds.  Pulses:      Radial pulses are 2+ on the right side, and 2+ on the left side.       Dorsalis pedis pulses are 2+ on the right side, and 2+ on the left side.  Pulmonary/Chest: Effort normal and breath sounds normal.  Lymphadenopathy:  No cervical adenopathy  Neurological: He is alert. GCS eye subscore is 4. GCS verbal subscore is 5. GCS motor subscore is 6.  Speech is fluent without obvious dysarthria or dysphasia. Strength 5/5 with hand grip and ankle F/E.   Sensation to light touch intact in hands and feet. Normal gait. No pronator drift. No leg drop.  Normal finger-to-nose and finger tapping.  CN I and VIII not tested. CN II-XII grossly intact bilaterally.   Skin: Skin is warm and dry. Capillary refill takes less than 2 seconds. No rash noted.  Psychiatric: His speech is normal and behavior is normal. Judgment and thought content normal. His affect is blunt.     ED Treatments / Results  Labs (all labs ordered are listed, but only abnormal results are displayed) Labs Reviewed - No data to display  EKG None  Radiology No results found.  Procedures Procedures (including critical care time)  Medications  Ordered in ED Medications  ketorolac (TORADOL) injection 60 mg (60 mg Intramuscular Given 11/23/17 1739)  acetaminophen (TYLENOL) tablet 1,000 mg (1,000 mg Oral Given 11/23/17 1739)     Initial Impression / Assessment and Plan / ED Course  I have reviewed the triage vital signs and the nursing notes.  Pertinent labs & imaging results that were available during my care of the patient were reviewed by me and considered in my medical decision making (see chart for details).    Patient is a 22 y.o. yo male with ppmh who presents with headache, h/o the same with previous ED visits for same. Patient is without high-risk features of headache including: confusion, seizure, headache with exertion, age > 50, meningeal signs, fever, use of anticoagulation, FMH of spontaneous SAH, concomitant drug use or toxic exposure, trauma, h/o CVA/TIA or HTN. On  exam VS are WNL, pt is well-appearing w/ no meningismus, nystagmus, focal neuro deficits, pain over temporal arteries.    Given reassuring hx and exam, emergent imaging or labs not indicated given. Low suspicion for emergent intracranial or vascular etiology.  He was given toradol and tylenol in ED. Pt adequate for DC. Discussed s/s that would warrant return to ED. Pt verbalized understanding and agreeable with ED tx and dc plan.  Final Clinical Impressions(s) / ED Diagnoses   Final diagnoses:  Bad headache    ED Discharge Orders    None       Jerrell Mylar 11/23/17 2134    Azalia Bilis, MD 11/24/17 [phone]

## 2017-11-23 NOTE — ED Triage Notes (Signed)
Pt c/o headache that started yesterday with increasing pain today. Denies NVD. Pt reports he took tylenol yesterday with no relief, denies taking anything today for the pain.

## 2017-11-25 ENCOUNTER — Encounter (HOSPITAL_COMMUNITY): Payer: Self-pay

## 2017-11-25 ENCOUNTER — Emergency Department (HOSPITAL_COMMUNITY): Payer: Self-pay

## 2017-11-25 ENCOUNTER — Emergency Department (HOSPITAL_COMMUNITY)
Admission: EM | Admit: 2017-11-25 | Discharge: 2017-11-25 | Disposition: A | Discharge status: Home / Self Care | Payer: Self-pay | Attending: Emergency Medicine | Admitting: Emergency Medicine

## 2017-11-25 DIAGNOSIS — R110 Nausea: Secondary | ICD-10-CM | POA: Insufficient documentation

## 2017-11-25 LAB — COMPREHENSIVE METABOLIC PANEL
ALK PHOS: 91 U/L (ref 38–126)
ALT: 15 U/L — AB (ref 17–63)
ANION GAP: 11 (ref 5–15)
AST: 23 U/L (ref 15–41)
Albumin: 4.2 g/dL (ref 3.5–5.0)
BUN: 19 mg/dL (ref 6–20)
CALCIUM: 9.6 mg/dL (ref 8.9–10.3)
CO2: 26 mmol/L (ref 22–32)
CREATININE: 1.07 mg/dL (ref 0.61–1.24)
Chloride: 108 mmol/L (ref 101–111)
GFR calc Af Amer: >60 mL/min (ref >60)
Glucose, Bld: 90 mg/dL (ref 65–99)
Potassium: 4.1 mmol/L (ref 3.5–5.1)
Sodium: 145 mmol/L (ref 135–145)
Total Bilirubin: 0.9 mg/dL (ref 0.3–1.2)
Total Protein: 8.1 g/dL (ref 6.5–8.1)

## 2017-11-25 LAB — CBC WITH DIFFERENTIAL/PLATELET
Basophils Absolute: 0 10*3/uL (ref 0.0–0.1)
Basophils Relative: 0 %
EOS ABS: 0 10*3/uL (ref 0.0–0.7)
EOS PCT: 1 %
HCT: 38.5 % — ABNORMAL LOW (ref 39.0–52.0)
HEMOGLOBIN: 12.6 g/dL — AB (ref 13.0–17.0)
LYMPHS ABS: 1.8 10*3/uL (ref 0.7–4.0)
LYMPHS PCT: 29 %
MCH: 28.5 pg (ref 26.0–34.0)
MCHC: 32.7 g/dL (ref 30.0–36.0)
MCV: 87.1 fL (ref 78.0–100.0)
MONOS PCT: 8 %
Monocytes Absolute: 0.5 10*3/uL (ref 0.1–1.0)
Neutro Abs: 3.9 10*3/uL (ref 1.7–7.7)
Neutrophils Relative %: 62 %
PLATELETS: 205 10*3/uL (ref 150–400)
RBC: 4.42 MIL/uL (ref 4.22–5.81)
RDW: 15.6 % — ABNORMAL HIGH (ref 11.5–15.5)
WBC: 6.3 10*3/uL (ref 4.0–10.5)

## 2017-11-25 LAB — ETHANOL: Alcohol, Ethyl (B): <10 mg/dL (ref <10)

## 2017-11-25 LAB — LIPASE, BLOOD: LIPASE: 43 U/L (ref 11–51)

## 2017-11-25 MED ORDER — PROMETHAZINE HCL 25 MG/ML IJ SOLN
25.0000 mg | Freq: Once | INTRAMUSCULAR | Status: AC
  Administered 2017-11-25: 25 mg via INTRAVENOUS
  Filled 2017-11-25: qty 1

## 2017-11-25 MED ORDER — ONDANSETRON HCL 4 MG/2ML IJ SOLN
4.0000 mg | Freq: Once | INTRAMUSCULAR | Status: AC
  Administered 2017-11-25: 4 mg via INTRAVENOUS
  Filled 2017-11-25: qty 2

## 2017-11-25 MED ORDER — PROMETHAZINE HCL 25 MG PO TABS: 25.0000 mg | ORAL_TABLET | Freq: Four times a day (QID) | ORAL | 0 refills | Status: DC | PRN

## 2017-11-25 MED ORDER — SODIUM CHLORIDE 0.9 % IV BOLUS
1000.0000 mL | Freq: Once | INTRAVENOUS | Status: AC
  Administered 2017-11-25: 1000 mL via INTRAVENOUS

## 2017-11-25 NOTE — Discharge Instructions (Signed)
Stay hydrated. You likely have a stomach virus.   Take phenergan as needed for nausea.   See your doctor  Return to ER if you have worse nausea, vomiting, fever, severe abdominal pain

## 2017-11-25 NOTE — ED Provider Notes (Signed)
[facility]-EMERGENCY DEPT Provider Note   CSN: [id number] Arrival date & time: 11/25/17  0112     History   Chief Complaint Chief Complaint  Patient presents with  . Nausea    HPI Collin White is a 22 y.o. male history of schizophrenia, seizures presenting with nausea, poor appetite.  Patient states that he lives with other people and unclear if he is out in the streets or not.  He states that 1 of his roommates is recently sick and had some gas and nausea and he felt sick yesterday.  He states that he was nauseated yesterday but denies any vomiting or diarrhea.  He did eat some food yesterday around 5 PM.  No fevers or chills.  Patient was seen here several days ago for headaches and this is a recurrent problem and the headache is not worse than usual.  Denies any weakness or numbness or vision changes.   The history is provided by the patient.    Past Medical History:  Diagnosis Date  . Psychoses (HCC)   . Schizophrenia (HCC)   . Seizures [facility])     Patient Active Problem List   Diagnosis Date Noted  . Schizophrenia (HCC) 06/30/2017  . Schizophrenia, paranoid type (HCC) 06/08/2017  . 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 Psychoses (HCC)   . Paranoid schizophrenia (HCC) 09/29/2015  . Cannabis use disorder, moderate, in early remission (HCC) 09/26/2015  . History of posttraumatic stress disorder (PTSD) 09/26/2015    Past Surgical History:  Procedure Laterality Date  . abd surgery s/p traumatic event    . facial reconstructive surgery    . NO PAST SURGERIES  patient stated he had facial reconstruction surgery as a child   . tumor removed from head           Home Medications    Prior to Admission medications   Medication Sig Start Date End Date Taking? Authorizing Provider  citalopram (CELEXA) 20 MG tablet Take 1 tablet (20 mg total) by mouth daily. For depression Patient not taking: Reported on 08/11/2017 07/15/17   Armandina Stammer  I, NP  fluticasone (FLONASE) 50 MCG/ACT nasal spray Place 2 sprays into both nostrils daily. Patient not taking: Reported on 08/11/2017 08/07/17   Muthersbaugh, Dahlia Client, PA-C  fluticasone [facility]) 50 MCG/ACT nasal spray Place 2 sprays into both nostrils daily. Patient not taking: Reported on 08/11/2017 08/07/17   Muthersbaugh, Dahlia Client, PA-C  hydrOXYzine (ATARAX/VISTARIL) 25 MG tablet Take 1 tablet (25 mg total) by mouth every 6 (six) hours as needed for anxiety. Patient not taking: Reported on 08/11/2017 07/14/17   Armandina Stammer I, NP  ibuprofen (ADVIL,MOTRIN) 200 MG tablet Take 2 tablets (400 mg total) by mouth every 6 (six) hours as needed for moderate pain. Patient not taking: Reported on 08/08/2017 07/27/17   Caccavale, Sophia, PA-C  OLANZapine (ZYPREXA) 15 MG tablet Take 2 tablets (30 mg total) by mouth at bedtime. For mood control Patient not taking: Reported on 08/11/2017 07/14/17   Armandina Stammer I, NP  potassium chloride (K-DUR) 10 MEQ tablet Take 1 tablet (10 mEq total) by mouth daily. Patient not taking: Reported on 08/08/2017 07/26/17   Caccavale, Sophia, PA-C  promethazine (PHENERGAN) 25 MG tablet Take 1 tablet (25 mg total) by mouth every 6 (six) hours as needed for nausea or vomiting. Patient not taking: Reported on 08/19/2017 08/12/17   Antony Madura, PA-C  traZODone (DESYREL) 100 MG tablet Take 1 tablet 100 mg by mouth at bedtime: For sleep Patient  not taking: Reported on 08/11/2017 07/14/17   Sanjuana KavaNwoko, Agnes I, NP    Family History Family History  Problem Relation Age of Onset  . Hypertension Other   . Mental illness Neg Hx     Social History Social History   Tobacco Use  . Smoking status: Former Smoker    Types: Cigarettes  . Smokeless tobacco: Never Used  Substance Use Topics  . Alcohol use: No  . Drug use: No     Allergies   Patient has no known allergies.   Review of Systems Review of Systems  Gastrointestinal: Positive for nausea.  All other systems reviewed  and are negative.    Physical Exam Updated Vital Signs BP (!) 128/99 (BP Location: Left Arm)   Pulse (!) 59   Temp 98.5 F (36.9 C) (Oral)   Resp 14   SpO2 100%   Physical Exam  Constitutional: He is oriented to person, place, and time.  Disheveled, unkempt   HENT:  Head: Normocephalic.  MM slightly dry   Eyes: Pupils are equal, round, and reactive to light. Conjunctivae and EOM are normal.  Neck: Normal range of motion. Neck supple.  Cardiovascular: Normal rate, regular rhythm and normal heart sounds.  Pulmonary/Chest: Effort normal and breath sounds normal. No stridor. No respiratory distress. He has no wheezes.  Abdominal: Soft. Bowel sounds are normal. He exhibits no distension. There is no tenderness.  Musculoskeletal: Normal range of motion.  Neurological: He is alert and oriented to person, place, and time.  Skin: Skin is warm.  Nursing note and vitals reviewed.    ED Treatments / Results  Labs (all labs ordered are listed, but only abnormal results are displayed) Labs Reviewed  CBC WITH DIFFERENTIAL/PLATELET - Abnormal; Notable for the following components:      Result Value   Hemoglobin 12.6 (*)    HCT 38.5 (*)    RDW 15.6 (*)    All other components within normal limits  COMPREHENSIVE METABOLIC PANEL - Abnormal; Notable for the following components:   ALT 15 (*)    All other components within normal limits  LIPASE, BLOOD  ETHANOL  RAPID URINE DRUG SCREEN, HOSP PERFORMED  URINALYSIS, ROUTINE W REFLEX MICROSCOPIC    EKG None  Radiology Dg Abd Acute W/chest  Result Date: 11/25/2017 CLINICAL DATA:  Mid abdominal pain.  Nausea EXAM: DG ABDOMEN ACUTE W/ 1V CHEST COMPARISON:  06/18/2017 FINDINGS: There is no evidence of dilated bowel loops or free intraperitoneal air. No radiopaque calculi or other significant radiographic abnormality is seen. Heart size and mediastinal contours are within normal limits. Both lungs are clear. IMPRESSION: Negative abdominal  radiographs.  No acute cardiopulmonary disease. Electronically Signed   By: Signa Kellaylor  Stroud M.D.   On: 11/25/2017 07:57    Procedures Procedures (including critical care time)  Medications Ordered in ED Medications  sodium chloride 0.9 % bolus 1,000 mL (0 mLs Intravenous Stopped 11/25/17 0834)  promethazine (PHENERGAN) injection 25 mg (25 mg Intravenous Given 11/25/17 0744)  ondansetron (ZOFRAN) injection 4 mg (4 mg Intravenous Given 11/25/17 [id number])     Initial Impression / Assessment and Plan / ED Course  I have reviewed the triage vital signs and the nursing notes.  Pertinent labs & imaging results that were available during my care of the patient were reviewed by me and considered in my medical decision making (see chart for details).     Kerrin MoMalcolm Schnabel is a 22 y.o. male here with nausea. No vomiting. Has chronic headaches  that is not worse than usual. I think likely mild gastro. Will get labs, acute abdominal series. Will hydrate and give antiemetics and reassess.   8:34 AM Labs and xrays unremarkable. No vomiting in the ED. Felt better after phenergan and fluids. Stable for discharge.   Final Clinical Impressions(s) / ED Diagnoses   Final diagnoses:  None    ED Discharge Orders    None       Charlynne Pander, MD 11/25/17 [phone]

## 2017-11-25 NOTE — ED Notes (Signed)
Patient aware we need urine sample. Urinal at bedside. Patient is unable to void at this time.

## 2017-11-25 NOTE — ED Triage Notes (Signed)
Pt complains of being nauseated, denies andy vomiting or diarrhea Pt states he did eat about 5pm

## 2017-12-06 ENCOUNTER — Emergency Department (HOSPITAL_COMMUNITY)
Admission: EM | Admit: 2017-12-06 | Discharge: 2017-12-07 | Disposition: A | Discharge status: Home / Self Care | Payer: Self-pay | Attending: Emergency Medicine | Admitting: Emergency Medicine

## 2017-12-06 ENCOUNTER — Other Ambulatory Visit: Payer: Self-pay

## 2017-12-06 ENCOUNTER — Encounter (HOSPITAL_COMMUNITY): Payer: Self-pay

## 2017-12-06 DIAGNOSIS — R1084 Generalized abdominal pain: Secondary | ICD-10-CM | POA: Insufficient documentation

## 2017-12-06 DIAGNOSIS — J029 Acute pharyngitis, unspecified: Secondary | ICD-10-CM | POA: Insufficient documentation

## 2017-12-06 DIAGNOSIS — Z87891 Personal history of nicotine dependence: Secondary | ICD-10-CM | POA: Insufficient documentation

## 2017-12-06 DIAGNOSIS — F209 Schizophrenia, unspecified: Secondary | ICD-10-CM | POA: Insufficient documentation

## 2017-12-06 DIAGNOSIS — R110 Nausea: Secondary | ICD-10-CM | POA: Insufficient documentation

## 2017-12-06 DIAGNOSIS — Z79899 Other long term (current) drug therapy: Secondary | ICD-10-CM | POA: Insufficient documentation

## 2017-12-06 LAB — COMPREHENSIVE METABOLIC PANEL
ALT: 6 U/L — ABNORMAL LOW (ref 17–63)
AST: 23 U/L (ref 15–41)
Albumin: 4.6 g/dL (ref 3.5–5.0)
Alkaline Phosphatase: 94 U/L (ref 38–126)
Anion gap: 12 (ref 5–15)
BILIRUBIN TOTAL: 1.1 mg/dL (ref 0.3–1.2)
BUN: 22 mg/dL — AB (ref 6–20)
CO2: 24 mmol/L (ref 22–32)
Calcium: 9.7 mg/dL (ref 8.9–10.3)
Chloride: 105 mmol/L (ref 101–111)
Creatinine, Ser: 1.21 mg/dL (ref 0.61–1.24)
GFR calc Af Amer: >60 mL/min (ref >60)
GFR calc non Af Amer: >60 mL/min (ref >60)
GLUCOSE: 90 mg/dL (ref 65–99)
POTASSIUM: 4 mmol/L (ref 3.5–5.1)
Sodium: 141 mmol/L (ref 135–145)
TOTAL PROTEIN: 8.5 g/dL — AB (ref 6.5–8.1)

## 2017-12-06 LAB — CBC WITH DIFFERENTIAL/PLATELET
BASOS PCT: 0 %
Basophils Absolute: 0 10*3/uL (ref 0.0–0.1)
Eosinophils Absolute: 0.1 10*3/uL (ref 0.0–0.7)
Eosinophils Relative: 1 %
HEMATOCRIT: 41 % (ref 39.0–52.0)
HEMOGLOBIN: 13.8 g/dL (ref 13.0–17.0)
Lymphocytes Relative: 35 %
Lymphs Abs: 2.4 10*3/uL (ref 0.7–4.0)
MCH: 29.1 pg (ref 26.0–34.0)
MCHC: 33.7 g/dL (ref 30.0–36.0)
MCV: 86.5 fL (ref 78.0–100.0)
MONO ABS: 0.5 10*3/uL (ref 0.1–1.0)
Monocytes Relative: 7 %
NEUTROS ABS: 4 10*3/uL (ref 1.7–7.7)
NEUTROS PCT: 57 %
Platelets: 213 10*3/uL (ref 150–400)
RBC: 4.74 MIL/uL (ref 4.22–5.81)
RDW: 14.8 % (ref 11.5–15.5)
WBC: 7 10*3/uL (ref 4.0–10.5)

## 2017-12-06 LAB — URINALYSIS, ROUTINE W REFLEX MICROSCOPIC
Bilirubin Urine: NEGATIVE
GLUCOSE, UA: NEGATIVE mg/dL
Hgb urine dipstick: NEGATIVE
KETONES UR: 5 mg/dL — AB
LEUKOCYTES UA: NEGATIVE
Nitrite: NEGATIVE
Protein, ur: NEGATIVE mg/dL
Specific Gravity, Urine: 1.032 — ABNORMAL HIGH (ref 1.005–1.030)
pH: 5 (ref 5.0–8.0)

## 2017-12-06 LAB — LIPASE, BLOOD: Lipase: 41 U/L (ref 11–51)

## 2017-12-06 MED ORDER — ONDANSETRON 4 MG PO TBDP
4.0000 mg | ORAL_TABLET | Freq: Once | ORAL | Status: AC
  Administered 2017-12-06: 4 mg via ORAL
  Filled 2017-12-06: qty 1

## 2017-12-06 MED ORDER — ACETAMINOPHEN 325 MG PO TABS
650.0000 mg | ORAL_TABLET | Freq: Once | ORAL | Status: AC
  Administered 2017-12-06: 650 mg via ORAL
  Filled 2017-12-06: qty 2

## 2017-12-06 NOTE — Discharge Instructions (Addendum)
Your labs are reassuring, please continue using Tylenol as needed for pain.  Please follow-up with your primary care doctor.  Return to the ED for persistent vomiting, fevers, worsening localized abdominal pain, or any other new or concerning symptoms.

## 2017-12-06 NOTE — ED Triage Notes (Signed)
Pt has multiple complaints. He reports nausea, sore throat and feeling weak when he opens doors. A&Ox4. Ambulatory.

## 2017-12-06 NOTE — ED Provider Notes (Signed)
[facility]-EMERGENCY DEPT Provider Note   CSN: [id number] Arrival date & time: 12/06/17  1804     History   Chief Complaint Chief Complaint  Patient presents with  . Sore Throat  . Nausea    HPI Collin White is a 22 y.o. male.  Collin White is a 22 y.o. Male with a history of schizophrenia and seizures, well-known to the emergency department with 32 visits in the last 6 months, presents to the ED with multiple complaints.  Patient primarily complaining of nausea and occasional sore throat.  During evaluation patient is speaking very softly and is difficult to understand.  He reports he has been feeling nauseous for the past few days, has not had any episodes of vomiting, reports diffuse abdominal pain which she is unable to localize or characterize.  Patient denies any hematemesis, melena or hematochezia, no diarrhea.  He denies any urinary symptoms.  Patient reports he has been having some sore throat he denies fevers or chills, occasional nonproductive cough and rhinorrhea.  Patient denies any difficulty swallowing, no shortness of breath or chest pain no neck pain.  He is not tried anything for any of his symptoms prior to arrival.  Denies any other aggravating or alleviating factors.  Patient has been seen earlier this month with similar symptoms and had reassuring workup.     Past Medical History:  Diagnosis Date  . Psychoses (HCC)   . Schizophrenia (HCC)   . Seizures [facility](HCC)     Patient Active Problem List   Diagnosis Date Noted  . Schizophrenia (HCC) 06/30/2017  . Schizophrenia, paranoid type (HCC) 06/08/2017  . 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 Psychoses (HCC)   . Paranoid schizophrenia (HCC) 09/29/2015  . Cannabis use disorder, moderate, in early remission (HCC) 09/26/2015  . History of posttraumatic stress disorder (PTSD) 09/26/2015    Past Surgical History:  Procedure Laterality Date  . abd surgery s/p traumatic event      . facial reconstructive surgery    . NO PAST SURGERIES  patient stated he had facial reconstruction surgery as a child   . tumor removed from head           Home Medications    Prior to Admission medications   Medication Sig Start Date End Date Taking? Authorizing Provider  citalopram (CELEXA) 20 MG tablet Take 1 tablet (20 mg total) by mouth daily. For depression Patient not taking: Reported on 08/11/2017 07/15/17   Armandina StammerNwoko, Agnes I, NP  fluticasone (FLONASE) 50 MCG/ACT nasal spray Place 2 sprays into both nostrils daily. Patient not taking: Reported on 08/11/2017 08/07/17   Muthersbaugh, Dahlia ClientHannah, PA-C  fluticasone [facility](FLONASE) 50 MCG/ACT nasal spray Place 2 sprays into both nostrils daily. Patient not taking: Reported on 08/11/2017 08/07/17   Muthersbaugh, Dahlia ClientHannah, PA-C  hydrOXYzine (ATARAX/VISTARIL) 25 MG tablet Take 1 tablet (25 mg total) by mouth every 6 (six) hours as needed for anxiety. Patient not taking: Reported on 08/11/2017 07/14/17   Armandina StammerNwoko, Agnes I, NP  ibuprofen (ADVIL,MOTRIN) 200 MG tablet Take 2 tablets (400 mg total) by mouth every 6 (six) hours as needed for moderate pain. Patient not taking: Reported on 08/08/2017 07/27/17   Caccavale, Sophia, PA-C  OLANZapine (ZYPREXA) 15 MG tablet Take 2 tablets (30 mg total) by mouth at bedtime. For mood control Patient not taking: Reported on 08/11/2017 07/14/17   Armandina StammerNwoko, Agnes I, NP  potassium chloride (K-DUR) 10 MEQ tablet Take 1 tablet (10 mEq total) by mouth daily. Patient not taking:  Reported on 08/08/2017 07/26/17   Caccavale, Sophia, PA-C  promethazine (PHENERGAN) 25 MG tablet Take 1 tablet (25 mg total) by mouth every 6 (six) hours as needed for nausea or vomiting. 11/25/17   Charlynne Pander, MD  traZODone (DESYREL) 100 MG tablet Take 1 tablet 100 mg by mouth at bedtime: For sleep Patient not taking: Reported on 08/11/2017 07/14/17   Sanjuana Kava, NP    Family History Family History  Problem Relation Age of Onset  .  Hypertension Other   . Mental illness Neg Hx     Social History Social History   Tobacco Use  . Smoking status: Former Smoker    Types: Cigarettes  . Smokeless tobacco: Never Used  Substance Use Topics  . Alcohol use: No  . Drug use: No     Allergies   Patient has no known allergies.   Review of Systems Review of Systems  Constitutional: Negative for chills and fever.  HENT: Positive for rhinorrhea and sore throat. Negative for trouble swallowing.   Eyes: Negative for visual disturbance.  Respiratory: Positive for cough. Negative for shortness of breath.   Cardiovascular: Negative for chest pain.  Gastrointestinal: Positive for abdominal pain and nausea. Negative for blood in stool, diarrhea and vomiting.  Genitourinary: Negative for dysuria and frequency.  Musculoskeletal: Negative for arthralgias and myalgias.  Skin: Negative for color change and rash.  Neurological: Negative for dizziness, syncope and light-headedness.     Physical Exam Updated Vital Signs BP 127/82 (BP Location: Left Arm)   Pulse 75   Temp 98.2 F (36.8 C) (Oral)   Resp 16   SpO2 100%   Physical Exam  Constitutional: He appears well-developed and well-nourished. No distress.  HENT:  Head: Normocephalic and atraumatic.  Right Ear: Tympanic membrane normal.  Left Ear: Tympanic membrane normal.  Mouth/Throat: Uvula is midline, oropharynx is clear and moist and mucous membranes are normal. No oropharyngeal exudate, posterior oropharyngeal edema or posterior oropharyngeal erythema. Tonsils are 0 on the right. Tonsils are 0 on the left. No tonsillar exudate.  Eyes: Right eye exhibits no discharge. Left eye exhibits no discharge.  Neck: Normal range of motion. Neck supple.  No rigidity  Cardiovascular: Normal rate, regular rhythm, normal heart sounds and intact distal pulses.  Pulmonary/Chest: Effort normal and breath sounds normal. No stridor. No respiratory distress. He has no wheezes. He has no  rhonchi. He has no rales. He exhibits no tenderness.  Respirations equal and unlabored, patient able to speak in full sentences, lungs clear to auscultation bilaterally  Abdominal: Soft. Bowel sounds are normal. He exhibits no distension and no mass. There is tenderness. There is no rebound and no guarding. No hernia.  Abdomen soft, nondistended, bowel sounds present throughout, mild tenderness diffusely, there is no guarding, with distracted exam patient does not complaint of much tenderness, no peritoneal signs.  Lymphadenopathy:    He has no cervical adenopathy.  Neurological: He is alert. Coordination normal.  Skin: Skin is warm and dry. Capillary refill takes less than 2 seconds. He is not diaphoretic.  Psychiatric: He has a normal mood and affect. His behavior is normal.  Nursing note and vitals reviewed.    ED Treatments / Results  Labs (all labs ordered are listed, but only abnormal results are displayed) Labs Reviewed  COMPREHENSIVE METABOLIC PANEL - Abnormal; Notable for the following components:      Result Value   BUN 22 (*)    Total Protein 8.5 (*)  ALT 6 (*)    All other components within normal limits  URINALYSIS, ROUTINE W REFLEX MICROSCOPIC - Abnormal; Notable for the following components:   Specific Gravity, Urine 1.032 (*)    Ketones, ur 5 (*)    All other components within normal limits  CBC WITH DIFFERENTIAL/PLATELET  LIPASE, BLOOD    EKG None  Radiology No results found.  Procedures Procedures (including critical care time)  Medications Ordered in ED Medications  ondansetron (ZOFRAN-ODT) disintegrating tablet 4 mg (4 mg Oral Given 12/06/17 2233)  acetaminophen (TYLENOL) tablet 650 mg (650 mg Oral Given 12/06/17 2233)     Initial Impression / Assessment and Plan / ED Course  I have reviewed the triage vital signs and the nursing notes.  Pertinent labs & imaging results that were available during my care of the patient were reviewed by me and  considered in my medical decision making (see chart for details).  Patient presents to the ED for evaluation of multiple complaints, primarily sore throat and nausea with some generalized abdominal pain.  Patient with normal vitals and appears to be in no acute distress.  Posterior oropharynx is clear, no concern for strep throat, PTA or RPA, patient is not exhibiting any stridor or respiratory distress.  Abdomen with generalized mild tenderness, no guarding or peritoneal signs, patient has had nausea but no vomiting he is not febrile here in the ED will check basic labs and give Zofran and Tylenol and reevaluate the patient.   Lab work is very reassuring, no leukocytosis, normal hemoglobin, no acute electrolyte derangements requiring intervention, normal renal and liver function, normal lipase, UA without signs of infection.  Patient reports symptoms improved after Zofran and Tylenol.  He is tolerating p.o. here in the ED.  Stable for discharge home at this time patient to follow-up with his primary care doctor.  Return precautions discussed.  Patient expressed understanding and is in agreement with plan.  Final Clinical Impressions(s) / ED Diagnoses   Final diagnoses:  Nausea  Generalized abdominal pain  Sore throat    ED Discharge Orders    None       Legrand Rams 12/07/17 1248    Linwood Dibbles, MD 12/09/17 [phone]

## 2017-12-09 ENCOUNTER — Encounter (HOSPITAL_COMMUNITY): Payer: Self-pay

## 2017-12-09 ENCOUNTER — Other Ambulatory Visit: Payer: Self-pay

## 2017-12-09 DIAGNOSIS — R1084 Generalized abdominal pain: Secondary | ICD-10-CM | POA: Insufficient documentation

## 2017-12-09 DIAGNOSIS — Z87891 Personal history of nicotine dependence: Secondary | ICD-10-CM | POA: Insufficient documentation

## 2017-12-09 LAB — COMPREHENSIVE METABOLIC PANEL
ALBUMIN: 4.6 g/dL (ref 3.5–5.0)
ALT: 10 U/L — ABNORMAL LOW (ref 17–63)
ANION GAP: 13 (ref 5–15)
AST: 19 U/L (ref 15–41)
Alkaline Phosphatase: 90 U/L (ref 38–126)
BILIRUBIN TOTAL: 0.6 mg/dL (ref 0.3–1.2)
BUN: 16 mg/dL (ref 6–20)
CHLORIDE: 107 mmol/L (ref 101–111)
CO2: 20 mmol/L — ABNORMAL LOW (ref 22–32)
Calcium: 9.5 mg/dL (ref 8.9–10.3)
Creatinine, Ser: 1.09 mg/dL (ref 0.61–1.24)
GFR calc Af Amer: >60 mL/min (ref >60)
GFR calc non Af Amer: >60 mL/min (ref >60)
GLUCOSE: 86 mg/dL (ref 65–99)
POTASSIUM: 3.9 mmol/L (ref 3.5–5.1)
Sodium: 140 mmol/L (ref 135–145)
TOTAL PROTEIN: 7.9 g/dL (ref 6.5–8.1)

## 2017-12-09 LAB — CBC
HEMATOCRIT: 39.1 % (ref 39.0–52.0)
HEMOGLOBIN: 12.8 g/dL — AB (ref 13.0–17.0)
MCH: 28.9 pg (ref 26.0–34.0)
MCHC: 32.7 g/dL (ref 30.0–36.0)
MCV: 88.3 fL (ref 78.0–100.0)
Platelets: 185 10*3/uL (ref 150–400)
RBC: 4.43 MIL/uL (ref 4.22–5.81)
RDW: 14.9 % (ref 11.5–15.5)
WBC: 7.7 10*3/uL (ref 4.0–10.5)

## 2017-12-09 LAB — LIPASE, BLOOD: LIPASE: 34 U/L (ref 11–51)

## 2017-12-09 MED ORDER — ONDANSETRON 4 MG PO TBDP
4.0000 mg | ORAL_TABLET | Freq: Once | ORAL | Status: AC | PRN
  Administered 2017-12-09: 4 mg via ORAL
  Filled 2017-12-09: qty 1

## 2017-12-09 NOTE — ED Triage Notes (Signed)
Per EMS- Patient c/o constant mid abdominal pain that is non radiating. Patient also c/o N/V that started yesterday.

## 2017-12-10 ENCOUNTER — Emergency Department (HOSPITAL_COMMUNITY)
Admission: EM | Admit: 2017-12-10 | Discharge: 2017-12-10 | Disposition: A | Discharge status: Home / Self Care | Payer: Self-pay | Attending: Emergency Medicine | Admitting: Emergency Medicine

## 2017-12-10 DIAGNOSIS — R1084 Generalized abdominal pain: Secondary | ICD-10-CM

## 2017-12-10 DIAGNOSIS — R110 Nausea: Secondary | ICD-10-CM

## 2017-12-10 LAB — URINALYSIS, ROUTINE W REFLEX MICROSCOPIC
Bacteria, UA: NONE SEEN
Bilirubin Urine: NEGATIVE
GLUCOSE, UA: NEGATIVE mg/dL
HGB URINE DIPSTICK: NEGATIVE
Ketones, ur: 5 mg/dL — AB
Leukocytes, UA: NEGATIVE
Nitrite: NEGATIVE
Protein, ur: 30 mg/dL — AB
SPECIFIC GRAVITY, URINE: 1.028 (ref 1.005–1.030)
pH: 6 (ref 5.0–8.0)

## 2017-12-10 MED ORDER — ACETAMINOPHEN 500 MG PO TABS
1000.0000 mg | ORAL_TABLET | Freq: Once | ORAL | Status: AC
  Administered 2017-12-10: 1000 mg via ORAL
  Filled 2017-12-10: qty 2

## 2017-12-10 MED ORDER — GI COCKTAIL ~~LOC~~
30.0000 mL | Freq: Once | ORAL | Status: AC
  Administered 2017-12-10: 30 mL via ORAL
  Filled 2017-12-10: qty 30

## 2017-12-10 NOTE — ED Provider Notes (Signed)
[facility]-EMERGENCY DEPT Provider Note   CSN: [id number] Arrival date & time: 12/09/17  1626     History   Chief Complaint Chief Complaint  Patient presents with  . Abdominal Pain  . Emesis    HPI Collin White is a 22 y.o. male.  Collin White is a 22 y.o. Male with history of schizophrenia and seizures, well-known to the emergency department with 32 visits in the last 6 months, presents to the ED for evaluation of nausea and epigastric abdominal pain.  Patient reports pain comes and goes nothing makes it worse or better.  He has not taken anything for the pain prior to arrival.  He reports nausea but no episodes of vomiting he is been able to tolerate food and fluids.  He denies any urinary symptoms.  No chest pain or shortness of breath.  Patient denies any fevers or chills.  Patient reports he received nausea medication in triage which has completely resolved his nausea and now he does have some very mild abdominal pain, pain does not radiate anywhere.  Patient denies any diarrhea, melena or hematochezia.     Past Medical History:  Diagnosis Date  . Psychoses (HCC)   . Schizophrenia (HCC)   . Seizures [name](HCC)     Patient Active Problem List   Diagnosis Date Noted  . Schizophrenia (HCC) 06/30/2017  . Schizophrenia, paranoid type (HCC) 06/08/2017  . 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 Psychoses (HCC)   . Paranoid schizophrenia (HCC) 09/29/2015  . Cannabis use disorder, moderate, in early remission (HCC) 09/26/2015  . History of posttraumatic stress disorder (PTSD) 09/26/2015    Past Surgical History:  Procedure Laterality Date  . abd surgery s/p traumatic event    . facial reconstructive surgery    . NO PAST SURGERIES  patient stated he had facial reconstruction surgery as a child   . tumor removed from head           Home Medications    Prior to Admission medications   Medication Sig Start Date End Date Taking?  Authorizing Provider  citalopram (CELEXA) 20 MG tablet Take 1 tablet (20 mg total) by mouth daily. For depression Patient not taking: Reported on 08/11/2017 07/15/17   Armandina StammerNwoko, Agnes I, NP  fluticasone (FLONASE) 50 MCG/ACT nasal spray Place 2 sprays into both nostrils daily. Patient not taking: Reported on 08/11/2017 08/07/17   Muthersbaugh, Dahlia ClientHannah, PA-C  fluticasone [facility](FLONASE) 50 MCG/ACT nasal spray Place 2 sprays into both nostrils daily. Patient not taking: Reported on 08/11/2017 08/07/17   Muthersbaugh, Dahlia ClientHannah, PA-C  hydrOXYzine (ATARAX/VISTARIL) 25 MG tablet Take 1 tablet (25 mg total) by mouth every 6 (six) hours as needed for anxiety. Patient not taking: Reported on 08/11/2017 07/14/17   Armandina StammerNwoko, Agnes I, NP  ibuprofen (ADVIL,MOTRIN) 200 MG tablet Take 2 tablets (400 mg total) by mouth every 6 (six) hours as needed for moderate pain. Patient not taking: Reported on 08/08/2017 07/27/17   Caccavale, Sophia, PA-C  OLANZapine (ZYPREXA) 15 MG tablet Take 2 tablets (30 mg total) by mouth at bedtime. For mood control Patient not taking: Reported on 08/11/2017 07/14/17   Armandina StammerNwoko, Agnes I, NP  potassium chloride (K-DUR) 10 MEQ tablet Take 1 tablet (10 mEq total) by mouth daily. Patient not taking: Reported on 08/08/2017 07/26/17   Caccavale, Sophia, PA-C  promethazine (PHENERGAN) 25 MG tablet Take 1 tablet (25 mg total) by mouth every 6 (six) hours as needed for nausea or vomiting. 11/25/17   Charlynne PanderYao, David Hsienta,  MD  traZODone (DESYREL) 100 MG tablet Take 1 tablet 100 mg by mouth at bedtime: For sleep Patient not taking: Reported on 08/11/2017 07/14/17   Sanjuana Kava, NP    Family History Family History  Problem Relation Age of Onset  . Hypertension Other   . Mental illness Neg Hx     Social History Social History   Tobacco Use  . Smoking status: Former Smoker    Types: Cigarettes  . Smokeless tobacco: Never Used  Substance Use Topics  . Alcohol use: No  . Drug use: No     Allergies     Patient has no known allergies.   Review of Systems Review of Systems  Constitutional: Negative for chills and fever.  HENT: Negative for congestion, rhinorrhea and sore throat.   Eyes: Negative for visual disturbance.  Respiratory: Negative for shortness of breath.   Cardiovascular: Negative for chest pain.  Gastrointestinal: Positive for abdominal pain and nausea. Negative for blood in stool, diarrhea and vomiting.  Genitourinary: Negative for dysuria and frequency.  Musculoskeletal: Negative for arthralgias and myalgias.  Skin: Negative for color change and rash.  Neurological: Negative for dizziness, seizures, weakness, light-headedness and headaches.     Physical Exam Updated Vital Signs BP 131/70 (BP Location: Left Arm)   Pulse (!) 51   Temp 98.1 F (36.7 C) (Oral)   Resp 16   Ht 5\' 11"  (1.803 m)   Wt 63.5 kg (140 lb)   SpO2 100%   BMI 19.53 kg/m   Physical Exam  Constitutional: He appears well-developed and well-nourished. No distress.  HENT:  Head: Normocephalic and atraumatic.  Mouth/Throat: Oropharynx is clear and moist.  Eyes: Right eye exhibits no discharge. Left eye exhibits no discharge.  Cardiovascular: Normal rate, regular rhythm, normal heart sounds and intact distal pulses.  Pulmonary/Chest: Effort normal and breath sounds normal. No respiratory distress.  Respirations equal and unlabored, patient able to speak in full sentences, lungs clear to auscultation bilaterally  Abdominal: Soft. Normal appearance and bowel sounds are normal. There is generalized tenderness. There is no rigidity, no rebound, no guarding, no CVA tenderness, no tenderness at McBurney's point and negative Murphy's sign. No hernia.  Abdomen soft, nondistended, bowel sounds present throughout, patient endorses mild tenderness to palpation throughout the abdomen, but exhibits no guarding or rebound tenderness, no peritoneal signs, no focal or localized tenderness  Neurological: He is  alert. Coordination normal.  Skin: Skin is warm and dry. Capillary refill takes less than 2 seconds. He is not diaphoretic.  Psychiatric: He has a normal mood and affect. His behavior is normal.  Nursing note and vitals reviewed.    ED Treatments / Results  Labs (all labs ordered are listed, but only abnormal results are displayed) Labs Reviewed  COMPREHENSIVE METABOLIC PANEL - Abnormal; Notable for the following components:      Result Value   CO2 20 (*)    ALT 10 (*)    All other components within normal limits  CBC - Abnormal; Notable for the following components:   Hemoglobin 12.8 (*)    All other components within normal limits  URINALYSIS, ROUTINE W REFLEX MICROSCOPIC - Abnormal; Notable for the following components:   Ketones, ur 5 (*)    Protein, ur 30 (*)    All other components within normal limits  LIPASE, BLOOD    EKG None  Radiology No results found.  Procedures Procedures (including critical care time)  Medications Ordered in ED Medications  gi cocktail (  Maalox,Lidocaine,Donnatal) (has no administration in time range)  acetaminophen (TYLENOL) tablet 1,000 mg (has no administration in time range)  ondansetron (ZOFRAN-ODT) disintegrating tablet 4 mg (4 mg Oral Given 12/09/17 1818)     Initial Impression / Assessment and Plan / ED Course  I have reviewed the triage vital signs and the nursing notes.  Pertinent labs & imaging results that were available during my care of the patient were reviewed by me and considered in my medical decision making (see chart for details).  Patient presents for evaluation of nausea and epigastric abdominal pain.  No associated fevers, emesis, urinary symptoms, melena or hematochezia.  On exam patient with normal vitals and in no acute distress.  Abdominal exam is very reassuring, no focal tenderness or peritoneal signs, not concerning for acute abdomen.  Patient was given Zofran in triage which resolved his nausea.  Labs are  overall very reassuring, there is no leukocytosis, normal hemoglobin, no acute electrolyte derangements requiring intervention, normal renal and liver function, normal lipase, UA without any evidence of infection.  Will give Tylenol and GI cocktail.   At this time there does not appear to be any evidence of an acute emergency medical condition and the patient appears stable for discharge with appropriate outpatient follow up. Patient who verbalizes understanding and is agreeable to discharge.    Final Clinical Impressions(s) / ED Diagnoses   Final diagnoses:  Generalized abdominal pain  Nausea    ED Discharge Orders    None       Dartha Lodge, [place] 12/10/17 1610    Ward, Layla Maw, DO 12/14/17 2340

## 2017-12-10 NOTE — Discharge Instructions (Signed)
Your evaluation today is reassuring, labs overall look good.  Please follow-up with your primary care doctor or go to the [facility] for recheck in the next few days.  Return to the ED for worsening or focal abdominal pain, persistent vomiting, fevers or other new or concerning symptoms.

## 2017-12-10 NOTE — ED Notes (Signed)
Pt ambulatory to restroom

## 2017-12-13 ENCOUNTER — Emergency Department (HOSPITAL_COMMUNITY)
Admission: EM | Admit: 2017-12-13 | Discharge: 2017-12-13 | Disposition: A | Discharge status: Home / Self Care | Payer: Self-pay | Attending: Emergency Medicine | Admitting: Emergency Medicine

## 2017-12-13 ENCOUNTER — Encounter (HOSPITAL_COMMUNITY): Payer: Self-pay

## 2017-12-13 ENCOUNTER — Other Ambulatory Visit: Payer: Self-pay

## 2017-12-13 DIAGNOSIS — Z87891 Personal history of nicotine dependence: Secondary | ICD-10-CM | POA: Insufficient documentation

## 2017-12-13 DIAGNOSIS — Z8669 Personal history of other diseases of the nervous system and sense organs: Secondary | ICD-10-CM | POA: Insufficient documentation

## 2017-12-13 DIAGNOSIS — R519 Headache, unspecified: Secondary | ICD-10-CM

## 2017-12-13 DIAGNOSIS — R51 Headache: Secondary | ICD-10-CM | POA: Insufficient documentation

## 2017-12-13 MED ORDER — KETOROLAC TROMETHAMINE 60 MG/2ML IM SOLN
60.0000 mg | Freq: Once | INTRAMUSCULAR | Status: AC
  Administered 2017-12-13: 60 mg via INTRAMUSCULAR
  Filled 2017-12-13: qty 2

## 2017-12-13 NOTE — ED Triage Notes (Signed)
Pt reports a generalized headache and nausea that started today. Hx of same.

## 2017-12-13 NOTE — ED Provider Notes (Signed)
[facility]-EMERGENCY DEPT Provider Note   CSN: [id number] Arrival date & time: 12/13/17  0014     History   Chief Complaint Chief Complaint  Patient presents with  . Headache    HPI Collin White is a 22 y.o. male with a hx of psychosis, schizophrenia, seizures, chronic headaches presents to the Emergency Department complaining of gradual, persistent, headache onset 5 PM yesterday.  He states he has had headaches like this in the past.  He calls this " head twitching."  He states he has not taken any medication prior to arrival because when he takes medication it makes his pain worse however he states he came here for medication to make his have better.  Patient reports no aggravating or alleviating factors at this time.  He denies vision changes, neck pain, neck stiffness, fevers, chills, chest pain, shortness of breath, abdominal pain, nausea, vomiting, diarrhea, weakness, dizziness, syncope.  The history is provided by the patient and medical records. No language interpreter was used.    Past Medical History:  Diagnosis Date  . Psychoses (HCC)   . Schizophrenia (HCC)   . Seizures [facility])     Patient Active Problem List   Diagnosis Date Noted  . Schizophrenia (HCC) 06/30/2017  . Schizophrenia, paranoid type (HCC) 06/08/2017  . 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 Psychoses (HCC)   . Paranoid schizophrenia (HCC) 09/29/2015  . Cannabis use disorder, moderate, in early remission (HCC) 09/26/2015  . History of posttraumatic stress disorder (PTSD) 09/26/2015    Past Surgical History:  Procedure Laterality Date  . abd surgery s/p traumatic event    . facial reconstructive surgery    . NO PAST SURGERIES  patient stated he had facial reconstruction surgery as a child   . tumor removed from head           Home Medications    Prior to Admission medications   Medication Sig Start Date End Date Taking? Authorizing Provider  citalopram  (CELEXA) 20 MG tablet Take 1 tablet (20 mg total) by mouth daily. For depression Patient not taking: Reported on 08/11/2017 07/15/17   Armandina Stammer I, NP  fluticasone (FLONASE) 50 MCG/ACT nasal spray Place 2 sprays into both nostrils daily. Patient not taking: Reported on 08/11/2017 08/07/17   Demarus Latterell, Dahlia Client, PA-C  fluticasone [facility]) 50 MCG/ACT nasal spray Place 2 sprays into both nostrils daily. Patient not taking: Reported on 08/11/2017 08/07/17   Suhaan Perleberg, Dahlia Client, PA-C  hydrOXYzine (ATARAX/VISTARIL) 25 MG tablet Take 1 tablet (25 mg total) by mouth every 6 (six) hours as needed for anxiety. Patient not taking: Reported on 08/11/2017 07/14/17   Armandina Stammer I, NP  ibuprofen (ADVIL,MOTRIN) 200 MG tablet Take 2 tablets (400 mg total) by mouth every 6 (six) hours as needed for moderate pain. Patient not taking: Reported on 08/08/2017 07/27/17   Caccavale, Sophia, PA-C  OLANZapine (ZYPREXA) 15 MG tablet Take 2 tablets (30 mg total) by mouth at bedtime. For mood control Patient not taking: Reported on 08/11/2017 07/14/17   Armandina Stammer I, NP  potassium chloride (K-DUR) 10 MEQ tablet Take 1 tablet (10 mEq total) by mouth daily. Patient not taking: Reported on 08/08/2017 07/26/17   Caccavale, Sophia, PA-C  promethazine (PHENERGAN) 25 MG tablet Take 1 tablet (25 mg total) by mouth every 6 (six) hours as needed for nausea or vomiting. Patient not taking: Reported on 12/10/2017 11/25/17   Charlynne Pander, MD  traZODone (DESYREL) 100 MG tablet Take 1 tablet 100 mg  by mouth at bedtime: For sleep Patient not taking: Reported on 08/11/2017 07/14/17   Sanjuana Kava, NP    Family History Family History  Problem Relation Age of Onset  . Hypertension Other   . Mental illness Neg Hx     Social History Social History   Tobacco Use  . Smoking status: Former Smoker    Types: Cigarettes  . Smokeless tobacco: Never Used  Substance Use Topics  . Alcohol use: No  . Drug use: No      Allergies   Patient has no known allergies.   Review of Systems Review of Systems  Constitutional: Negative for appetite change, diaphoresis, fatigue, fever and unexpected weight change.  HENT: Negative for mouth sores.   Eyes: Negative for visual disturbance.  Respiratory: Negative for cough, chest tightness, shortness of breath and wheezing.   Cardiovascular: Negative for chest pain.  Gastrointestinal: Negative for abdominal pain, constipation, diarrhea, nausea and vomiting.  Endocrine: Negative for polydipsia, polyphagia and polyuria.  Genitourinary: Negative for dysuria, frequency, hematuria and urgency.  Musculoskeletal: Negative for back pain and neck stiffness.  Skin: Negative for rash.  Allergic/Immunologic: Negative for immunocompromised state.  Neurological: Positive for headaches. Negative for syncope and light-headedness.  Hematological: Does not bruise/bleed easily.  Psychiatric/Behavioral: Negative for sleep disturbance. The patient is not nervous/anxious.      Physical Exam Updated Vital Signs BP (!) 128/96   Pulse 64   Temp (!) 97.4 F (36.3 C) (Oral)   Resp 20   SpO2 97%   Physical Exam  Constitutional: He is oriented to person, place, and time. He appears well-developed and well-nourished. No distress.  HENT:  Head: Normocephalic and atraumatic.  Mouth/Throat: Oropharynx is clear and moist.  Eyes: Pupils are equal, round, and reactive to light. Conjunctivae and EOM are normal. No scleral icterus.  No horizontal, vertical or rotational nystagmus  Neck: Normal range of motion. Neck supple.  Full active and passive ROM without pain No midline or paraspinal tenderness No nuchal rigidity or meningeal signs  Cardiovascular: Normal rate, regular rhythm and intact distal pulses.  Pulmonary/Chest: Effort normal and breath sounds normal. No respiratory distress. He has no wheezes. He has no rales.  Abdominal: Soft. Bowel sounds are normal. There is no  tenderness. There is no rebound and no guarding.  Musculoskeletal: Normal range of motion.  Lymphadenopathy:    He has no cervical adenopathy.  Neurological: He is alert and oriented to person, place, and time. No cranial nerve deficit. He exhibits normal muscle tone. Coordination normal.  Mental Status:  Alert, oriented, thought content appropriate. Speech fluent without evidence of aphasia. Able to follow 2 step commands without difficulty.  Cranial Nerves:  II:  Peripheral visual fields grossly normal, pupils equal, round, reactive to light III,IV, VI: ptosis not present, extra-ocular motions intact bilaterally  V,VII: smile symmetric, facial light touch sensation equal VIII: hearing grossly normal bilaterally  IX,X: midline uvula rise  XI: bilateral shoulder shrug equal and strong XII: midline tongue extension  Motor:  5/5 in upper and lower extremities bilaterally including strong and equal grip strength and dorsiflexion/plantar flexion Sensory: Pinprick and light touch normal in all extremities.  Cerebellar: normal finger-to-nose with bilateral upper extremities Gait: normal gait and balance CV: distal pulses palpable throughout   Skin: Skin is warm and dry. No rash noted. He is not diaphoretic.  Psychiatric: He has a normal mood and affect. His behavior is normal. Thought content is paranoid.  Nursing note and vitals reviewed.  ED Treatments / Results   Procedures Procedures (including critical care time)  Medications Ordered in ED Medications  ketorolac (TORADOL) injection 60 mg (60 mg Intramuscular Given 12/13/17 0523)     Initial Impression / Assessment and Plan / ED Course  I have reviewed the triage vital signs and the nursing notes.  Pertinent labs & imaging results that were available during my care of the patient were reviewed by me and considered in my medical decision making (see chart for details).     Vision presents with generalized headache.  He is  neurologically intact.  Record review shows that he is been seen multiple times for similar complaints.  He is not anticoagulated.  No trauma on exam.  He denies trauma.  Patient's last head CT was 2017 however a highly doubt intracranial hemorrhage or intracranial mass at this time.  Patient given Toradol IM.  Patient reports feeling significantly better after administration.  He is ambulatory without difficulty here in the emergency department.  Patient instructed to return if symptoms change or worsen.  He states understanding and is in agreement with this plan.  Final Clinical Impressions(s) / ED Diagnoses   Final diagnoses:  Chronic nonintractable headache, unspecified headache type    ED Discharge Orders    None       Milta Deiters 12/13/17 [id number]    Palumbo, April, MD 12/13/17 [phone]

## 2017-12-13 NOTE — ED Notes (Signed)
No respiratory or acute distress noted alert and oriented x 3 clear speech noted moves all extremities. 

## 2017-12-13 NOTE — ED Notes (Signed)
Pt refused to sign for discharge instructions 

## 2017-12-13 NOTE — Discharge Instructions (Addendum)
1. Medications: usual home medications 2. Treatment: rest, drink plenty of fluids,  3. Follow Up: Please followup with your primary doctor in 2-3 days for discussion of your diagnoses and further evaluation after today's visit; if you do not have a primary care doctor use the resource guide provided to find one; Please return to the ER for vision changes, vomiting or other concerns

## 2017-12-14 ENCOUNTER — Emergency Department (HOSPITAL_COMMUNITY)
Admission: EM | Admit: 2017-12-14 | Discharge: 2017-12-15 | Disposition: A | Discharge status: Home / Self Care | Payer: Self-pay | Attending: Emergency Medicine | Admitting: Emergency Medicine

## 2017-12-14 ENCOUNTER — Other Ambulatory Visit: Payer: Self-pay

## 2017-12-14 ENCOUNTER — Encounter (HOSPITAL_COMMUNITY): Payer: Self-pay | Admitting: *Deleted

## 2017-12-14 DIAGNOSIS — R6889 Other general symptoms and signs: Secondary | ICD-10-CM

## 2017-12-14 DIAGNOSIS — Z79899 Other long term (current) drug therapy: Secondary | ICD-10-CM | POA: Insufficient documentation

## 2017-12-14 DIAGNOSIS — R519 Headache, unspecified: Secondary | ICD-10-CM

## 2017-12-14 DIAGNOSIS — R51 Headache: Secondary | ICD-10-CM | POA: Insufficient documentation

## 2017-12-14 DIAGNOSIS — H538 Other visual disturbances: Secondary | ICD-10-CM | POA: Insufficient documentation

## 2017-12-14 DIAGNOSIS — Z87891 Personal history of nicotine dependence: Secondary | ICD-10-CM | POA: Insufficient documentation

## 2017-12-14 MED ORDER — METOCLOPRAMIDE HCL 5 MG/ML IJ SOLN
10.0000 mg | Freq: Once | INTRAMUSCULAR | Status: AC
  Administered 2017-12-15: 10 mg via INTRAMUSCULAR
  Filled 2017-12-14: qty 2

## 2017-12-14 MED ORDER — KETOROLAC TROMETHAMINE 60 MG/2ML IM SOLN
60.0000 mg | Freq: Once | INTRAMUSCULAR | Status: AC
  Administered 2017-12-14: 60 mg via INTRAMUSCULAR
  Filled 2017-12-14: qty 2

## 2017-12-14 MED ORDER — GI COCKTAIL ~~LOC~~
30.0000 mL | Freq: Once | ORAL | Status: AC
  Administered 2017-12-15: 30 mL via ORAL
  Filled 2017-12-14: qty 30

## 2017-12-14 NOTE — ED Provider Notes (Signed)
[facility] EMERGENCY DEPARTMENT Provider Note   CSN: [id number] Arrival date & time: 12/14/17  1842     History   Chief Complaint Chief Complaint  Patient presents with  . Headache    HPI Collin White is a 22 y.o. male.  22 year old male with a history of seizures, borderline intellectual functioning, paranoid schizophrenia, PTSD presents to the emergency department for complaints of headache.  Symptoms began at 1432 today.  He reports the pain originating in his right temple and radiating throughout his body.  He states that his symptoms are consistent with a "head tremor".  He has not taken any medications for his symptoms prior to arrival.  They have been constant since onset.  He has reports of blurriness to the medial visual fields of his left eye.  No history of head injury or trauma, fevers, nausea, vomiting.  He has been seen 34 times in the emergency department in the past 6 months for various complaints.     Past Medical History:  Diagnosis Date  . Psychoses (HCC)   . Schizophrenia (HCC)   . Seizures [facility])     Patient Active Problem List   Diagnosis Date Noted  . Schizophrenia (HCC) 06/30/2017  . Schizophrenia, paranoid type (HCC) 06/08/2017  . 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 Psychoses (HCC)   . Paranoid schizophrenia (HCC) 09/29/2015  . Cannabis use disorder, moderate, in early remission (HCC) 09/26/2015  . History of posttraumatic stress disorder (PTSD) 09/26/2015    Past Surgical History:  Procedure Laterality Date  . abd surgery s/p traumatic event    . facial reconstructive surgery    . NO PAST SURGERIES  patient stated he had facial reconstruction surgery as a child   . tumor removed from head           Home Medications    Prior to Admission medications   Medication Sig Start Date End Date Taking? Authorizing Provider  citalopram (CELEXA) 20 MG tablet Take 1 tablet (20 mg total) by mouth daily. For  depression Patient not taking: Reported on 08/11/2017 07/15/17   Armandina Stammer I, NP  fluticasone (FLONASE) 50 MCG/ACT nasal spray Place 2 sprays into both nostrils daily. Patient not taking: Reported on 08/11/2017 08/07/17   Muthersbaugh, Dahlia Client, PA-C  fluticasone [facility]) 50 MCG/ACT nasal spray Place 2 sprays into both nostrils daily. Patient not taking: Reported on 08/11/2017 08/07/17   Muthersbaugh, Dahlia Client, PA-C  hydrOXYzine (ATARAX/VISTARIL) 25 MG tablet Take 1 tablet (25 mg total) by mouth every 6 (six) hours as needed for anxiety. Patient not taking: Reported on 08/11/2017 07/14/17   Armandina Stammer I, NP  ibuprofen (ADVIL,MOTRIN) 200 MG tablet Take 2 tablets (400 mg total) by mouth every 6 (six) hours as needed for moderate pain. Patient not taking: Reported on 08/08/2017 07/27/17   Caccavale, Sophia, PA-C  OLANZapine (ZYPREXA) 15 MG tablet Take 2 tablets (30 mg total) by mouth at bedtime. For mood control Patient not taking: Reported on 08/11/2017 07/14/17   Armandina Stammer I, NP  potassium chloride (K-DUR) 10 MEQ tablet Take 1 tablet (10 mEq total) by mouth daily. Patient not taking: Reported on 08/08/2017 07/26/17   Caccavale, Sophia, PA-C  promethazine (PHENERGAN) 25 MG tablet Take 1 tablet (25 mg total) by mouth every 6 (six) hours as needed for nausea or vomiting. Patient not taking: Reported on 12/10/2017 11/25/17   Charlynne Pander, MD  traZODone (DESYREL) 100 MG tablet Take 1 tablet 100 mg by mouth at bedtime: For  sleep Patient not taking: Reported on 08/11/2017 07/14/17   Sanjuana Kava, NP    Family History Family History  Problem Relation Age of Onset  . Hypertension Other   . Mental illness Neg Hx     Social History Social History   Tobacco Use  . Smoking status: Former Smoker    Types: Cigarettes  . Smokeless tobacco: Never Used  Substance Use Topics  . Alcohol use: No  . Drug use: No     Allergies   Patient has no known allergies.   Review of  Systems Review of Systems Ten systems reviewed and are negative for acute change, except as noted in the HPI.    Physical Exam Updated Vital Signs BP 108/77   Pulse 66   Temp 97.9 F (36.6 C) (Oral)   Resp 17   SpO2 96%   Physical Exam  Constitutional: He is oriented to person, place, and time. He appears well-developed and well-nourished. No distress.  Smells of body odor. Disheveled.   HENT:  Head: Normocephalic and atraumatic.  Mouth/Throat: Oropharynx is clear and moist.  Eyes: Pupils are equal, round, and reactive to light. Conjunctivae and EOM are normal. No scleral icterus.  Neck: Normal range of motion.  Pulmonary/Chest: Effort normal. No respiratory distress.  Respirations even and unlabored  Musculoskeletal: Normal range of motion.  Neurological: He is alert and oriented to person, place, and time. He exhibits normal muscle tone. Coordination normal.  GCS 15. Speech is goal oriented. No cranial nerve deficits appreciated; symmetric eyebrow raise, no facial drooping, tongue midline. Patient has equal grip strength bilaterally with 5/5 strength against resistance in all major muscle groups bilaterally. Sensation to light touch intact. Patient moves extremities without ataxia. Patient ambulatory with steady gait.  Skin: Skin is warm and dry. No rash noted. He is not diaphoretic. No erythema. No pallor.  Psychiatric: He has a normal mood and affect. His behavior is normal.  Nursing note and vitals reviewed.    ED Treatments / Results  Labs (all labs ordered are listed, but only abnormal results are displayed) Labs Reviewed - No data to display  EKG None  Radiology No results found.  Procedures Procedures (including critical care time)  Medications Ordered in ED Medications  ketorolac (TORADOL) injection 60 mg (60 mg Intramuscular Given 12/14/17 2252)  gi cocktail (Maalox,Lidocaine,Donnatal) (30 mLs Oral Given 12/15/17 0019)  metoCLOPramide (REGLAN) injection 10  mg (10 mg Intramuscular Given 12/15/17 0019)    11:57 PM Patient states that the medicine has helped his headaches some, but he is now complaining of nausea and abdominal pain.  Patient reports his symptoms are "like, you know, like when a diabetic is sick".  He has been seen in the emergency department previously for nausea as well.  Will manage with GI cocktail and IM shot of Reglan.  1:08 AM Patient states that his stomach is starting to feel better.  He has had no nausea or vomiting.  Vitals have been stable.  Will fluid challenge.  Requesting Sprite.  1:32 AM Patient tolerating fluids.  No vomiting noted.   Initial Impression / Assessment and Plan / ED Course  I have reviewed the triage vital signs and the nursing notes.  Pertinent labs & imaging results that were available during my care of the patient were reviewed by me and considered in my medical decision making (see chart for details).     Patient presents to the emergency department for evaluation of headache which began at  1432 today.  Patient with no history of recent head injury or trauma.  No fever, nuchal rigidity, meningismus to suggest meningitis.  Neurologic exam today is nonfocal.  He has been seen in the ED 34 times in the past 6 months; occasionally for similar complaints.   Treated in the ED with Toradol, Reglan. He was given a GI cocktail for abdominal discomfort and nausea which patient also has a hx of. No N/V. Tolerating Sprite. On reassessment, the patient has had improvement in headache symptoms following a migraine cocktail.  I do not believe further emergent workup is indicated at this time.  Return precautions discussed and provided.  Patient discharged in stable condition with no unaddressed concerns.  Vitals:   12/14/17 2230 12/14/17 2232 12/14/17 2300 12/15/17 0100  BP: (!) 138/95  (!) 117/94 108/77  Pulse:  68 65 66  Resp:      Temp:      TempSrc:      SpO2:  100% 100% 96%    Final Clinical  Impressions(s) / ED Diagnoses   Final diagnoses:  Multiple complaints  Bad headache    ED Discharge Orders    None       Antony Madura, PA-C 12/15/17 0134    Raeford Razor, MD 12/15/17 1538

## 2017-12-14 NOTE — ED Triage Notes (Addendum)
Pt reports headache that started around 1400, hx of same. Denies n/v.

## 2017-12-18 ENCOUNTER — Emergency Department (HOSPITAL_COMMUNITY)
Admission: EM | Admit: 2017-12-18 | Discharge: 2017-12-19 | Disposition: A | Discharge status: Home / Self Care | Payer: Self-pay | Attending: Emergency Medicine | Admitting: Emergency Medicine

## 2017-12-18 ENCOUNTER — Other Ambulatory Visit: Payer: Self-pay

## 2017-12-18 ENCOUNTER — Encounter (HOSPITAL_COMMUNITY): Payer: Self-pay

## 2017-12-18 DIAGNOSIS — R519 Headache, unspecified: Secondary | ICD-10-CM

## 2017-12-18 DIAGNOSIS — R51 Headache: Secondary | ICD-10-CM | POA: Insufficient documentation

## 2017-12-18 DIAGNOSIS — R1084 Generalized abdominal pain: Secondary | ICD-10-CM | POA: Insufficient documentation

## 2017-12-18 DIAGNOSIS — Z79899 Other long term (current) drug therapy: Secondary | ICD-10-CM | POA: Insufficient documentation

## 2017-12-18 DIAGNOSIS — Z87891 Personal history of nicotine dependence: Secondary | ICD-10-CM | POA: Insufficient documentation

## 2017-12-18 MED ORDER — ACETAMINOPHEN 500 MG PO TABS
1000.0000 mg | ORAL_TABLET | Freq: Once | ORAL | Status: AC
  Administered 2017-12-18: 1000 mg via ORAL
  Filled 2017-12-18: qty 2

## 2017-12-18 MED ORDER — GI COCKTAIL ~~LOC~~
30.0000 mL | Freq: Once | ORAL | Status: AC
  Administered 2017-12-18: 30 mL via ORAL
  Filled 2017-12-18: qty 30

## 2017-12-18 NOTE — ED Provider Notes (Signed)
[facility]-EMERGENCY DEPT Provider Note   CSN: [id number] Arrival date & time: 12/18/17  2127     History   Chief Complaint Chief Complaint  Patient presents with  . Headache  . Abdominal Pain    HPI Collin White is a 22 y.o. male with a hx of seizures, paranoid schizophrenia, PTSD presents to the Emergency Department complaining of gradual, persistent, generalized and throbbing headache onset around 7:30pm. Pt reports his symptoms are consistent with a "head tremor" the same as his previous.  Associated symptoms include generalized abdominal pain with nausea but without vomiting that started after he ate at [facility] this evening.  Pt reports that walking makes his abd worse.  No treatments PTA, no alleviating factors.  Pt denies fever, trauma, vision changes, numbness, tingling, weakness.  Record review shows pt has been evaluated 35 times in the last 6 mos occasionally with similar complaints and does have chronic headaches.       The history is provided by the patient and medical records. No language interpreter was used.    Past Medical History:  Diagnosis Date  . Psychoses (HCC)   . Schizophrenia (HCC)   . Seizures [facility])     Patient Active Problem List   Diagnosis Date Noted  . Schizophrenia (HCC) 06/30/2017  . Schizophrenia, paranoid type (HCC) 06/08/2017  . 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 Psychoses (HCC)   . Paranoid schizophrenia (HCC) 09/29/2015  . Cannabis use disorder, moderate, in early remission (HCC) 09/26/2015  . History of posttraumatic stress disorder (PTSD) 09/26/2015    Past Surgical History:  Procedure Laterality Date  . abd surgery s/p traumatic event    . facial reconstructive surgery    . NO PAST SURGERIES  patient stated he had facial reconstruction surgery as a child   . tumor removed from head           Home Medications    Prior to Admission medications   Medication Sig Start Date End Date Taking?  Authorizing Provider  citalopram (CELEXA) 20 MG tablet Take 1 tablet (20 mg total) by mouth daily. For depression Patient not taking: Reported on 08/11/2017 07/15/17   Armandina Stammer I, NP  fluticasone (FLONASE) 50 MCG/ACT nasal spray Place 2 sprays into both nostrils daily. Patient not taking: Reported on 08/11/2017 08/07/17   Stacey Sago, Dahlia Client, PA-C  fluticasone [facility]) 50 MCG/ACT nasal spray Place 2 sprays into both nostrils daily. Patient not taking: Reported on 08/11/2017 08/07/17   Merion Caton, Dahlia Client, PA-C  hydrOXYzine (ATARAX/VISTARIL) 25 MG tablet Take 1 tablet (25 mg total) by mouth every 6 (six) hours as needed for anxiety. Patient not taking: Reported on 08/11/2017 07/14/17   Armandina Stammer I, NP  ibuprofen (ADVIL,MOTRIN) 200 MG tablet Take 2 tablets (400 mg total) by mouth every 6 (six) hours as needed for moderate pain. Patient not taking: Reported on 08/08/2017 07/27/17   Caccavale, Sophia, PA-C  OLANZapine (ZYPREXA) 15 MG tablet Take 2 tablets (30 mg total) by mouth at bedtime. For mood control Patient not taking: Reported on 08/11/2017 07/14/17   Armandina Stammer I, NP  potassium chloride (K-DUR) 10 MEQ tablet Take 1 tablet (10 mEq total) by mouth daily. Patient not taking: Reported on 08/08/2017 07/26/17   Caccavale, Sophia, PA-C  promethazine (PHENERGAN) 25 MG tablet Take 1 tablet (25 mg total) by mouth every 6 (six) hours as needed for nausea or vomiting. Patient not taking: Reported on 12/10/2017 11/25/17   Charlynne Pander, MD  traZODone (DESYREL) 100  MG tablet Take 1 tablet 100 mg by mouth at bedtime: For sleep Patient not taking: Reported on 08/11/2017 07/14/17   Sanjuana Kava, NP    Family History Family History  Problem Relation Age of Onset  . Hypertension Other   . Mental illness Neg Hx     Social History Social History   Tobacco Use  . Smoking status: Former Smoker    Types: Cigarettes  . Smokeless tobacco: Never Used  Substance Use Topics  . Alcohol  use: No  . Drug use: No     Allergies   Patient has no known allergies.   Review of Systems Review of Systems  Constitutional: Negative for appetite change, diaphoresis, fatigue, fever and unexpected weight change.  HENT: Negative for mouth sores.   Eyes: Negative for visual disturbance.  Respiratory: Negative for cough, chest tightness, shortness of breath and wheezing.   Cardiovascular: Negative for chest pain.  Gastrointestinal: Positive for abdominal pain and nausea. Negative for constipation, diarrhea and vomiting.  Endocrine: Negative for polydipsia, polyphagia and polyuria.  Genitourinary: Negative for dysuria, frequency, hematuria and urgency.  Musculoskeletal: Negative for back pain and neck stiffness.  Skin: Negative for rash.  Allergic/Immunologic: Negative for immunocompromised state.  Neurological: Positive for headaches. Negative for syncope and light-headedness.  Hematological: Does not bruise/bleed easily.  Psychiatric/Behavioral: Negative for sleep disturbance. The patient is not nervous/anxious.      Physical Exam Updated Vital Signs BP 112/81 (BP Location: Right Arm)   Pulse 68   Temp 98.7 F (37.1 C) (Oral)   Resp 18   SpO2 99%   Physical Exam  Constitutional: He is oriented to person, place, and time. He appears well-developed and well-nourished. No distress.  HENT:  Head: Normocephalic and atraumatic.  Mouth/Throat: Oropharynx is clear and moist.  Eyes: Pupils are equal, round, and reactive to light. Conjunctivae and EOM are normal. No scleral icterus.  No horizontal, vertical or rotational nystagmus  Neck: Normal range of motion. Neck supple.  Full active and passive ROM without pain No midline or paraspinal tenderness No nuchal rigidity or meningeal signs  Cardiovascular: Normal rate, regular rhythm and intact distal pulses.  Pulmonary/Chest: Effort normal and breath sounds normal. No respiratory distress. He has no wheezes. He has no rales.    Abdominal: Soft. Bowel sounds are normal. There is generalized tenderness. There is no rebound and no guarding.  Very mild abd nonfocal tenderness  Musculoskeletal: Normal range of motion.  Lymphadenopathy:    He has no cervical adenopathy.  Neurological: He is alert and oriented to person, place, and time. No cranial nerve deficit. He exhibits normal muscle tone. Coordination normal.  Mental Status:  Alert, oriented. Speech fluent without evidence of aphasia. Able to follow 2 step commands without difficulty.  Cranial Nerves:  II: pupils equal, round, reactive to light III,IV, VI: ptosis not present, extra-ocular motions intact bilaterally  V,VII: smile symmetric, facial light touch sensation equal VIII: hearing grossly normal bilaterally  IX,X: midline uvula rise  XI: bilateral shoulder shrug equal and strong XII: midline tongue extension  Motor:  5/5 in upper and lower extremities bilaterally including strong and equal grip strength and dorsiflexion/plantar flexion Sensory: Pinprick and light touch normal in all extremities.  Cerebellar: normal finger-to-nose with bilateral upper extremities Gait: normal gait and balance CV: distal pulses palpable throughout   Skin: Skin is warm and dry. No rash noted. He is not diaphoretic.  Psychiatric: He has a normal mood and affect. His behavior is normal. Judgment  and thought content normal.  Nursing note and vitals reviewed.    ED Treatments / Results   Procedures Procedures (including critical care time)  Medications Ordered in ED Medications  acetaminophen (TYLENOL) tablet 1,000 mg (1,000 mg Oral Given 12/18/17 2255)  gi cocktail (Maalox,Lidocaine,Donnatal) (30 mLs Oral Given 12/18/17 2255)     Initial Impression / Assessment and Plan / ED Course  I have reviewed the triage vital signs and the nursing notes.  Pertinent labs & imaging results that were available during my care of the patient were reviewed by me and considered in my  medical decision making (see chart for details).     Patient presents with generalized headache and generalized abdominal pain.  He has a normal neurologic exam.  He walks with a steady gait.  He is not vomiting here in the emergency department.  His abdomen initially is minimally tender without rebound or guarding.  No focal pain.  Patient has had numerous work-ups for both his headaches and his abdominal pain in the past.  His vital signs are stable.  He is without fever.  Doubt acute process at this time.  Patient given acetaminophen and GI cocktail.  He reports complete resolution of his headache and his abdominal pain.  He is tolerated fluids and solid food without difficulty or emesis.  Patient will be discharged at this time.  Instructed to return for new or worsening symptoms.  Patient states understanding.  Final Clinical Impressions(s) / ED Diagnoses   Final diagnoses:  Generalized headache  Generalized abdominal pain    ED Discharge Orders    None       Mardene Sayer Boyd Kerbs 12/19/17 0016    Rolan Bucco, MD 12/19/17 1406

## 2017-12-18 NOTE — ED Triage Notes (Signed)
Pt reports headache and abdominal pain that started when he left [employer]. He also states, "I coughed up a loogie that was kind of hard." A&Ox4. Ambulatory.

## 2017-12-19 NOTE — Discharge Instructions (Addendum)
1. Medications: usual home medications °2. Treatment: rest, drink plenty of fluids,  °3. Follow Up: Please followup with your primary doctor in 1-2 days for discussion of your diagnoses and further evaluation after today's visit; if you do not have a primary care doctor use the resource guide provided to find one; Please return to the ER for worsening symptoms or other concerns ° °

## 2017-12-21 DIAGNOSIS — Z87891 Personal history of nicotine dependence: Secondary | ICD-10-CM | POA: Insufficient documentation

## 2017-12-21 DIAGNOSIS — F200 Paranoid schizophrenia: Secondary | ICD-10-CM | POA: Insufficient documentation

## 2017-12-21 DIAGNOSIS — Z79899 Other long term (current) drug therapy: Secondary | ICD-10-CM | POA: Insufficient documentation

## 2017-12-21 DIAGNOSIS — R51 Headache: Secondary | ICD-10-CM | POA: Insufficient documentation

## 2017-12-21 DIAGNOSIS — F4310 Post-traumatic stress disorder, unspecified: Secondary | ICD-10-CM | POA: Insufficient documentation

## 2017-12-22 ENCOUNTER — Encounter (HOSPITAL_COMMUNITY): Payer: Self-pay

## 2017-12-22 ENCOUNTER — Emergency Department (HOSPITAL_COMMUNITY)
Admission: EM | Admit: 2017-12-22 | Discharge: 2017-12-22 | Disposition: A | Discharge status: Home / Self Care | Payer: Self-pay | Attending: Emergency Medicine | Admitting: Emergency Medicine

## 2017-12-22 DIAGNOSIS — R519 Headache, unspecified: Secondary | ICD-10-CM

## 2017-12-22 DIAGNOSIS — R51 Headache: Secondary | ICD-10-CM

## 2017-12-22 MED ORDER — PREDNISONE 20 MG PO TABS
60.0000 mg | ORAL_TABLET | Freq: Once | ORAL | Status: AC
  Administered 2017-12-22: 60 mg via ORAL
  Filled 2017-12-22: qty 3

## 2017-12-22 MED ORDER — KETOROLAC TROMETHAMINE 60 MG/2ML IM SOLN
60.0000 mg | Freq: Once | INTRAMUSCULAR | Status: AC
  Administered 2017-12-22: 60 mg via INTRAMUSCULAR
  Filled 2017-12-22: qty 2

## 2017-12-22 MED ORDER — METOCLOPRAMIDE HCL 10 MG PO TABS
10.0000 mg | ORAL_TABLET | Freq: Once | ORAL | Status: AC
  Administered 2017-12-22: 10 mg via ORAL
  Filled 2017-12-22: qty 1

## 2017-12-22 MED ORDER — ACETAMINOPHEN 325 MG PO TABS
650.0000 mg | ORAL_TABLET | Freq: Once | ORAL | Status: AC
  Administered 2017-12-22: 650 mg via ORAL
  Filled 2017-12-22: qty 2

## 2017-12-22 MED ORDER — METHYLPREDNISOLONE SODIUM SUCC 125 MG IJ SOLR: 80.0000 mg | Freq: Once | INTRAMUSCULAR | Status: DC

## 2017-12-22 NOTE — ED Triage Notes (Signed)
Pt complains of a headache for several days

## 2017-12-22 NOTE — Discharge Instructions (Signed)
Headache Your lab results showed no acute abnormalities.  For future headaches please try the following regimen: Antiinflammatory medications: Take 600 mg of ibuprofen every 6 hours or 440 mg (over the counter dose) to 500 mg (prescription dose) of naproxen every 12 hours for the next 3 days. After this time, these medications may be used as needed for pain. Take these medications with food to avoid upset stomach. Choose only one of these medications, do not take them together. Tylenol: Should you continue to have additional pain while taking the ibuprofen or naproxen, you may add in tylenol as needed. Your daily total maximum amount of tylenol from all sources should be limited to /day for persons without liver problems, or /day for those with liver problems.  Hydration: Have a goal of about a half liter of water every couple hours to stay well hydrated.   Sleep: Please be sure to get plenty of sleep with a goal of 8 hours per night. Having a regular bed time and bedtime routine can help with this.  Screens: Reduce the amount of time you are in front of screens.  Take about a 5-10-minute break every hour or every couple hours to give your eyes rest.  Do not use screens in dark rooms.  Glasses with a blue light filter may also help reduce eye fatigue.  Stress: Take steps to reduce stress as much as possible.   Follow up: Follow-up with your primary care provider on this issue.  May also need to follow-up with the neurologist for increased frequency of headaches.

## 2017-12-22 NOTE — ED Notes (Signed)
Pt wants to be referred as a" ms. [name]"
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
## 2017-12-25 ENCOUNTER — Emergency Department (HOSPITAL_COMMUNITY)
Admission: EM | Admit: 2017-12-25 | Discharge: 2017-12-26 | Disposition: A | Discharge status: Home / Self Care | Payer: Self-pay | Attending: Emergency Medicine | Admitting: Emergency Medicine

## 2017-12-25 ENCOUNTER — Encounter (HOSPITAL_COMMUNITY): Payer: Self-pay | Admitting: Emergency Medicine

## 2017-12-25 ENCOUNTER — Other Ambulatory Visit: Payer: Self-pay

## 2017-12-25 DIAGNOSIS — W57XXXA Bitten or stung by nonvenomous insect and other nonvenomous arthropods, initial encounter: Secondary | ICD-10-CM | POA: Insufficient documentation

## 2017-12-25 DIAGNOSIS — Y999 Unspecified external cause status: Secondary | ICD-10-CM | POA: Insufficient documentation

## 2017-12-25 DIAGNOSIS — Z87891 Personal history of nicotine dependence: Secondary | ICD-10-CM | POA: Insufficient documentation

## 2017-12-25 DIAGNOSIS — Y929 Unspecified place or not applicable: Secondary | ICD-10-CM | POA: Insufficient documentation

## 2017-12-25 DIAGNOSIS — Y939 Activity, unspecified: Secondary | ICD-10-CM | POA: Insufficient documentation

## 2017-12-25 DIAGNOSIS — S1096XA Insect bite of unspecified part of neck, initial encounter: Secondary | ICD-10-CM | POA: Insufficient documentation

## 2017-12-25 DIAGNOSIS — F209 Schizophrenia, unspecified: Secondary | ICD-10-CM | POA: Insufficient documentation

## 2017-12-25 NOTE — Discharge Instructions (Addendum)
You were seen here for a bug bite. There is no evidence of infection at this time.  Apply ice to the area.  If you develop redness surrounding this, increasing swelling, drainage or fever you can return for further evaluation.   HOW TO MAKE AN ICE PACK  To make an ice pack, do one of the following:  Place crushed ice or a bag of frozen vegetables in a sealable plastic bag. Squeeze out the excess air. Place this bag inside another plastic bag. Slide the bag into a pillowcase or place a damp towel between your skin and the bag.  Mix 3 parts water with 1 part rubbing alcohol. Freeze the mixture in a sealable plastic bag. When you remove the mixture from the freezer, it will be slushy. Squeeze out the excess air. Place this bag inside another plastic bag. Slide the bag into a pillowcase or place a damp towel between your skin and the ice pack.

## 2017-12-25 NOTE — ED Triage Notes (Signed)
Patient complaining of a bite on his neck. Nothing is seen on his neck. Patient has no other complaints.

## 2017-12-25 NOTE — ED Notes (Signed)
Bed: WTR7 Expected date:  Expected time:  Means of arrival:  Comments: 
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
## 2018-01-01 ENCOUNTER — Emergency Department (HOSPITAL_COMMUNITY)
Admission: EM | Admit: 2018-01-01 | Discharge: 2018-01-02 | Disposition: A | Discharge status: Home / Self Care | Payer: Self-pay | Attending: Emergency Medicine | Admitting: Emergency Medicine

## 2018-01-01 DIAGNOSIS — Z79899 Other long term (current) drug therapy: Secondary | ICD-10-CM | POA: Insufficient documentation

## 2018-01-01 DIAGNOSIS — Z87891 Personal history of nicotine dependence: Secondary | ICD-10-CM | POA: Insufficient documentation

## 2018-01-01 DIAGNOSIS — R519 Headache, unspecified: Secondary | ICD-10-CM

## 2018-01-01 DIAGNOSIS — J3489 Other specified disorders of nose and nasal sinuses: Secondary | ICD-10-CM | POA: Insufficient documentation

## 2018-01-01 DIAGNOSIS — R51 Headache: Secondary | ICD-10-CM | POA: Insufficient documentation

## 2018-01-01 DIAGNOSIS — H5789 Other specified disorders of eye and adnexa: Secondary | ICD-10-CM | POA: Insufficient documentation

## 2018-01-01 MED ORDER — OXYMETAZOLINE HCL 0.05 % NA SOLN
1.0000 | Freq: Once | NASAL | Status: DC
  Filled 2018-01-01: qty 15

## 2018-01-01 MED ORDER — KETOROLAC TROMETHAMINE 60 MG/2ML IM SOLN
60.0000 mg | Freq: Once | INTRAMUSCULAR | Status: AC
  Administered 2018-01-02: 60 mg via INTRAMUSCULAR
  Filled 2018-01-01: qty 2
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
## 2018-01-02 ENCOUNTER — Encounter (HOSPITAL_COMMUNITY): Payer: Self-pay

## 2018-01-02 ENCOUNTER — Encounter (HOSPITAL_COMMUNITY): Payer: Self-pay | Admitting: Emergency Medicine

## 2018-01-02 ENCOUNTER — Emergency Department (HOSPITAL_COMMUNITY)
Admission: EM | Admit: 2018-01-02 | Discharge: 2018-01-07 | Disposition: A | Discharge status: Other Acute Inpatient Hospital | Payer: Self-pay | Attending: Emergency Medicine | Admitting: Emergency Medicine

## 2018-01-02 ENCOUNTER — Emergency Department (HOSPITAL_COMMUNITY)
Admission: EM | Admit: 2018-01-02 | Discharge: 2018-01-02 | Disposition: A | Discharge status: Home / Self Care | Payer: Self-pay | Attending: Emergency Medicine | Admitting: Emergency Medicine

## 2018-01-02 ENCOUNTER — Emergency Department (HOSPITAL_COMMUNITY): Payer: Self-pay

## 2018-01-02 ENCOUNTER — Other Ambulatory Visit: Payer: Self-pay

## 2018-01-02 DIAGNOSIS — Z87891 Personal history of nicotine dependence: Secondary | ICD-10-CM | POA: Insufficient documentation

## 2018-01-02 DIAGNOSIS — Z79899 Other long term (current) drug therapy: Secondary | ICD-10-CM | POA: Insufficient documentation

## 2018-01-02 DIAGNOSIS — R0981 Nasal congestion: Secondary | ICD-10-CM | POA: Insufficient documentation

## 2018-01-02 DIAGNOSIS — F23 Brief psychotic disorder: Secondary | ICD-10-CM

## 2018-01-02 DIAGNOSIS — J069 Acute upper respiratory infection, unspecified: Secondary | ICD-10-CM | POA: Insufficient documentation

## 2018-01-02 DIAGNOSIS — F200 Paranoid schizophrenia: Secondary | ICD-10-CM | POA: Diagnosis present

## 2018-01-02 MED ORDER — ACETAMINOPHEN 325 MG PO TABS
650.0000 mg | ORAL_TABLET | Freq: Once | ORAL | Status: AC
  Administered 2018-01-02: 650 mg via ORAL
  Filled 2018-01-02: qty 2

## 2018-01-02 NOTE — ED Notes (Signed)
ED Provider at bedside. 

## 2018-01-02 NOTE — ED Notes (Signed)
Patient transported to X-ray 

## 2018-01-02 NOTE — ED Triage Notes (Signed)
Pt arriving via [facility]. [place] Pt was just discharged from facility approx 3 hours ago.

## 2018-01-02 NOTE — Discharge Instructions (Addendum)
1. Medications: usual home medications 2. Treatment: rest, drink plenty of fluids,  3. Follow Up: Please followup with your primary doctor in 1-2 days for discussion of your diagnoses and further evaluation after today's visit; if you do not have a primary care doctor use the resource guide provided to find one; Please return to the ER for vision changes, worsening pain in your eyes, headache that does not improve, vomiting, fevers or other concerns

## 2018-01-02 NOTE — ED Notes (Signed)
Pt found to have urinated and had a bowel movement on self in triage room. Pt cleaned by staff and given paper scrubs.

## 2018-01-02 NOTE — ED Triage Notes (Signed)
He tells me (in a strange, intense demeanor) that it is "hard for me to blow my nose". He then proceeds to show me tissues "full of snot" (there is essentially nothing on them). He is acting strangely, but specifically denies h.i./s.i. He further tells me he has "threee houses and four million dollars".

## 2018-01-02 NOTE — ED Triage Notes (Signed)
[place] [facility] by security when found in women's room.  C/o headache for 3 years.  Eyes irrigated at the scene.  Was refusing transport but changed his mind.

## 2018-01-02 NOTE — ED Notes (Signed)
Bed: W[facility]6 Expected date:  Expected time:  Means of arrival:  Comments: 

## 2018-01-02 NOTE — ED Triage Notes (Signed)
Pt complaining of headache beginning last night and rated 10/10. Pt reports burning in both eyes that began 30 minutes ago. No neuro deficits, pt ambulatory.
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
## 2018-01-03 LAB — CBC WITH DIFFERENTIAL/PLATELET
BASOS ABS: 0 10*3/uL (ref 0.0–0.1)
Basophils Relative: 0 %
Eosinophils Absolute: 0.1 10*3/uL (ref 0.0–0.7)
Eosinophils Relative: 1 %
HEMATOCRIT: 41.7 % (ref 39.0–52.0)
HEMOGLOBIN: 14.5 g/dL (ref 13.0–17.0)
LYMPHS ABS: 1.9 10*3/uL (ref 0.7–4.0)
LYMPHS PCT: 17 %
MCH: 29.9 pg (ref 26.0–34.0)
MCHC: 34.8 g/dL (ref 30.0–36.0)
MCV: 86 fL (ref 78.0–100.0)
Monocytes Absolute: 1.1 10*3/uL — ABNORMAL HIGH (ref 0.1–1.0)
Monocytes Relative: 10 %
NEUTROS ABS: 7.9 10*3/uL — AB (ref 1.7–7.7)
NEUTROS PCT: 72 %
Platelets: 189 10*3/uL (ref 150–400)
RBC: 4.85 MIL/uL (ref 4.22–5.81)
RDW: 13.2 % (ref 11.5–15.5)
WBC: 11 10*3/uL — AB (ref 4.0–10.5)

## 2018-01-03 LAB — BASIC METABOLIC PANEL
ANION GAP: 14 (ref 5–15)
BUN: 21 mg/dL — AB (ref 6–20)
CO2: 23 mmol/L (ref 22–32)
Calcium: 10 mg/dL (ref 8.9–10.3)
Chloride: 104 mmol/L (ref 101–111)
Creatinine, Ser: 1.3 mg/dL — ABNORMAL HIGH (ref 0.61–1.24)
GFR calc Af Amer: >60 mL/min (ref >60)
GFR calc non Af Amer: >60 mL/min (ref >60)
GLUCOSE: 93 mg/dL (ref 65–99)
POTASSIUM: 3.9 mmol/L (ref 3.5–5.1)
Sodium: 141 mmol/L (ref 135–145)

## 2018-01-03 LAB — ETHANOL

## 2018-01-03 LAB — ACETAMINOPHEN LEVEL: Acetaminophen (Tylenol), Serum: <10 ug/mL — ABNORMAL LOW (ref 10–30)
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
## 2018-01-03 NOTE — Progress Notes (Signed)
Per psychiatrist Elsie Saas and DNP Lord patient meets inpatient psychiatric admission criteria. CSW faxed patient's referral to the following facilities: [address], [address], [address], [address], [place], [address], [address], [place] and [facility].   Celso Sickle, [place] Clinical Social Worker [facility] Cell#: [phone]

## 2018-01-03 NOTE — ED Notes (Signed)
Patient currently asleep with no signs of distress noted at this time. Respirations regular and unlabored. 

## 2018-01-03 NOTE — ED Notes (Addendum)
[place], masturbating in the ED waiting area.  Pt disheveled, poor hygiene, body odor.  Pt rambles, not forthcoming with information. Awake, alert & responsive, denies SI or HI.  Denies feeling hopeless.  Monitoring for safety, Q 15 min checks in effect.  Comfort measures given, pt taking a shower at present.  Pt is a male to male transgender pt who answers to the name Collin White.  Pt has history of PTSD, Schizophrenia.

## 2018-01-03 NOTE — ED Notes (Addendum)
Pt standing in his room, naked with the door closed.  Pt redirected not to stand on the bed.

## 2018-01-03 NOTE — ED Notes (Signed)
Patient denies pain and is resting comfortably.  

## 2018-01-03 NOTE — ED Notes (Signed)
Bed: [facility] Expected date:  Expected time:  Means of arrival:  Comments: [place]

## 2018-01-03 NOTE — ED Provider Notes (Signed)
[facility] DEPT Provider Note: Collin Dell, MD, FACEP  CSN: [id number] MRN: [id number] ARRIVAL: 01/02/18 at 2122 ROOM: UXL24/MWN02   CHIEF COMPLAINT  Medical Clearance  Level 5 caveat: Psychosis HISTORY OF PRESENT ILLNESS  01/03/18 2:42 AM Collin White is a 22 y.o. male with a history of schizophrenia who was seen yesterday afternoon in this ED for congestion and cough.  He was evaluated and discharged home with a diagnosis of URI and nasal congestion.  He denied suicidal or homicidal ideations at the time.  He returned yesterday evening by EMS after [place] a park.  While sitting in the waiting room he was publicly masturbating and had to be moved back to the psych hold area.  He has had minimal verbal interaction with staff and his speech is noted to be rambling.  He has spent an extended period of time observing himself in the mirror.  He reportedly identifies as a male but has normal intact male genitalia and no breast development.    Past Medical History:  Diagnosis Date  . Psychoses (HCC)   . Schizophrenia (HCC)   . Seizures (HCC)     Past Surgical History:  Procedure Laterality Date  . abd surgery s/p traumatic event    . facial reconstructive surgery    . NO PAST SURGERIES  patient stated he had facial reconstruction surgery as a child   . tumor removed from head       Family History  Problem Relation Age of Onset  . Hypertension Other   . Mental illness Neg Hx     Social History   Tobacco Use  . Smoking status: Former Smoker    Types: Cigarettes  . Smokeless tobacco: Never Used  Substance Use Topics  . Alcohol use: No  . Drug use: No    Prior to Admission medications   Medication Sig Start Date End Date Taking? Authorizing Provider  citalopram (CELEXA) 20 MG tablet Take 1 tablet (20 mg total) by mouth daily. For depression Patient not taking: Reported on 01/02/2018 07/15/17   Armandina Stammer I, NP  fluticasone (FLONASE) 50 MCG/ACT  nasal spray Place 2 sprays into both nostrils daily. Patient not taking: Reported on 01/02/2018 08/07/17   Muthersbaugh, Dahlia Client, PA-C  fluticasone [name]) 50 MCG/ACT nasal spray Place 2 sprays into both nostrils daily. Patient not taking: Reported on 01/02/2018 08/07/17   Muthersbaugh, Dahlia Client, PA-C  hydrOXYzine (ATARAX/VISTARIL) 25 MG tablet Take 1 tablet (25 mg total) by mouth every 6 (six) hours as needed for anxiety. Patient not taking: Reported on 01/02/2018 07/14/17   Armandina Stammer I, NP  ibuprofen (ADVIL,MOTRIN) 200 MG tablet Take 2 tablets (400 mg total) by mouth every 6 (six) hours as needed for moderate pain. Patient not taking: Reported on 01/02/2018 07/27/17   Caccavale, Sophia, PA-C  OLANZapine (ZYPREXA) 15 MG tablet Take 2 tablets (30 mg total) by mouth at bedtime. For mood control Patient not taking: Reported on 01/02/2018 07/14/17   Armandina Stammer I, NP  potassium chloride (K-DUR) 10 MEQ tablet Take 1 tablet (10 mEq total) by mouth daily. Patient not taking: Reported on 01/02/2018 07/26/17   Caccavale, Sophia, PA-C  promethazine (PHENERGAN) 25 MG tablet Take 1 tablet (25 mg total) by mouth every 6 (six) hours as needed for nausea or vomiting. Patient not taking: Reported on 01/02/2018 11/25/17   Charlynne Pander, MD  traZODone (DESYREL) 100 MG tablet Take 1 tablet 100 mg by mouth at bedtime: For sleep Patient not  taking: Reported on 08/11/2017 07/14/17   Armandina Stammer I, NP    Allergies Patient has no known allergies.   REVIEW OF SYSTEMS     PHYSICAL EXAMINATION  Initial Vital Signs Blood pressure 123/85, pulse 81, temperature 98.1 F (36.7 C), temperature source Oral, resp. rate 18, SpO2 100 %.  Examination General: Well-developed, well-nourished male in no acute distress; appearance consistent with age of record; poor personal hygiene HENT: normocephalic; atraumatic Eyes: Normal appearance Neck: supple Heart: regular rate and rhythm Lungs: Normal respiratory effort and  excursion Abdomen: soft; nondistended Extremities: No deformity; full range of motion Neurologic: Awake, alert; motor function intact in all extremities and symmetric; no facial droop Skin: Warm and dry Psychiatric: Rambling speech; difficult to engage in conversation; no HI or SI   RESULTS  Summary of this visit's results, reviewed by myself:   EKG Interpretation  Date/Time:    Ventricular Rate:    PR Interval:    QRS Duration:   QT Interval:    QTC Calculation:   R Axis:     Text Interpretation:        Laboratory Studies: Results for orders placed or performed during the hospital encounter of 01/02/18 (from the past 24 hour(s))  CBC with Differential     Status: Abnormal   Collection Time: 01/03/18  1:13 AM  Result Value Ref Range   WBC 11.0 (H) 4.0 - 10.5 K/uL   RBC 4.85 4.22 - 5.81 MIL/uL   Hemoglobin 14.5 13.0 - 17.0 g/dL   HCT 40.9 81.1 - 91.4 %   MCV 86.0 78.0 - 100.0 fL   MCH 29.9 26.0 - 34.0 pg   MCHC 34.8 30.0 - 36.0 g/dL   RDW 78.2 95.6 - 21.3 %   Platelets 189 150 - 400 K/uL   Neutrophils Relative % 72 %   Neutro Abs 7.9 (H) 1.7 - 7.7 K/uL   Lymphocytes Relative 17 %   Lymphs Abs 1.9 0.7 - 4.0 K/uL   Monocytes Relative 10 %   Monocytes Absolute 1.1 (H) 0.1 - 1.0 K/uL   Eosinophils Relative 1 %   Eosinophils Absolute 0.1 0.0 - 0.7 K/uL   Basophils Relative 0 %   Basophils Absolute 0.0 0.0 - 0.1 K/uL  Basic metabolic panel     Status: Abnormal   Collection Time: 01/03/18  1:13 AM  Result Value Ref Range   Sodium 141 135 - 145 mmol/L   Potassium 3.9 3.5 - 5.1 mmol/L   Chloride 104 101 - 111 mmol/L   CO2 23 22 - 32 mmol/L   Glucose, Bld 93 65 - 99 mg/dL   BUN 21 (H) 6 - 20 mg/dL   Creatinine, Ser 0.86 (H) 0.61 - 1.24 mg/dL   Calcium 57.8 8.9 - 46.9 mg/dL   GFR calc non Af Amer >60 >60 mL/min   GFR calc Af Amer >60 >60 mL/min   Anion gap 14 5 - 15  Ethanol     Status: None   Collection Time: 01/03/18  1:14 AM  Result Value Ref Range   Alcohol,  Ethyl (B) <10 <10 mg/dL  Acetaminophen level     Status: Abnormal   Collection Time: 01/03/18  1:14 AM  Result Value Ref Range   Acetaminophen (Tylenol), Serum <10 (L) 10 - 30 ug/mL   Imaging Studies: Dg Chest 2 View  Result Date: 01/02/2018 CLINICAL DATA:  Cough and strange behavior. EXAM: CHEST - 2 VIEW COMPARISON:  11/25/2017 FINDINGS: The heart size and mediastinal contours  are within normal limits. Both lungs are clear. The visualized skeletal structures are unremarkable. IMPRESSION: No active cardiopulmonary disease. Electronically Signed   By: Elberta Fortis M.D.   On: 01/02/2018 16:13    ED COURSE and MDM  Nursing notes and initial vitals signs, including pulse oximetry, reviewed.  Vitals:   01/02/18 2129 01/02/18 2134  BP: 123/85   Pulse: 81   Resp: 18   Temp: 98.1 F (36.7 C)   TempSrc: Oral   SpO2: 100% 100%    PROCEDURES    ED DIAGNOSES     ICD-10-CM   1. Acute episode of schizophrenia with history of multiple episodes (HCC) F23        Omair Dettmer, Jonny Ruiz, MD 01/03/18 [phone]

## 2018-01-04 DIAGNOSIS — F200 Paranoid schizophrenia: Secondary | ICD-10-CM

## 2018-01-04 MED ORDER — STERILE WATER FOR INJECTION IJ SOLN
INTRAMUSCULAR | Status: AC
  Filled 2018-01-04: qty 10

## 2018-01-04 MED ORDER — OLANZAPINE 10 MG IM SOLR
10.0000 mg | Freq: Once | INTRAMUSCULAR | Status: AC
Start: 2018-01-04 — End: 2018-01-05
  Administered 2018-01-05: 10 mg via INTRAMUSCULAR
  Filled 2018-01-04: qty 10

## 2018-01-04 MED ORDER — CITALOPRAM HYDROBROMIDE 10 MG PO TABS
20.0000 mg | ORAL_TABLET | Freq: Every day | ORAL | Status: DC
  Administered 2018-01-05 – 2018-01-07 (×3): 20 mg via ORAL
  Filled 2018-01-04 (×3): qty 2

## 2018-01-04 MED ORDER — OLANZAPINE 10 MG IM SOLR
10.0000 mg | Freq: Once | INTRAMUSCULAR | Status: AC
  Administered 2018-01-04: 10 mg via INTRAMUSCULAR
  Filled 2018-01-04: qty 10

## 2018-01-04 MED ORDER — HYDROXYZINE HCL 25 MG PO TABS
25.0000 mg | ORAL_TABLET | Freq: Four times a day (QID) | ORAL | Status: DC | PRN
  Administered 2018-01-04 – 2018-01-06 (×3): 25 mg via ORAL
  Filled 2018-01-04 (×3): qty 1

## 2018-01-04 MED ORDER — OLANZAPINE 10 MG PO TABS
30.0000 mg | ORAL_TABLET | Freq: Every day | ORAL | Status: DC
  Administered 2018-01-04 – 2018-01-06 (×3): 30 mg via ORAL
  Filled 2018-01-04 (×3): qty 3

## 2018-01-04 MED ORDER — TRAZODONE HCL 100 MG PO TABS
100.0000 mg | ORAL_TABLET | Freq: Every day | ORAL | Status: DC
  Administered 2018-01-04 – 2018-01-06 (×3): 100 mg via ORAL
  Filled 2018-01-04 (×3): qty 1

## 2018-01-04 NOTE — ED Notes (Signed)
Pt went into restroom and began digging in his anus and licking his fingers.  Pt Is soft spoken and disorganized in his speech.

## 2018-01-04 NOTE — ED Notes (Signed)
Patient remains confused, but is more calm at this time and is willing to follow instructions. Patient taken out of his room to a new, clean room. Staff washed patient all over with hot, soapy washcloths and placed him into a clean bed. Patient covered up with a blanket and closed his eyes. No distress noted at this time.

## 2018-01-04 NOTE — ED Notes (Signed)
Patient remains asleep at this time, with no signs of distress noted.

## 2018-01-04 NOTE — ED Notes (Signed)
During rounds, this writer witnessed patient once again naked on his bed, up on his knees and sticking his fingers in his rectum. The patient's bed is noted to be covered in feces from top to bottom. Pt also seen eating feces off of his fingers and licking the sheets repeatedly. Charge nurse and MD notified of behaviors; new orders received.

## 2018-01-04 NOTE — BH Assessment (Signed)
[name] Assessment Progress Note  Per Leata Mouse, MD, this voluntary pt requires psychiatric hospitalization at this time.  The following facilities have been contacted to seek placement for this pt, with results as noted:  Beds available, information sent, decision pending:  [facility]   At capacity:  [facility] Doran Heater, [place] [facility] Coordinator [phone]

## 2018-01-04 NOTE — Consult Note (Addendum)
[facility] Consult   Reason for Consult:  Paranoid behavior Referring Physician:  EDP Patient Identification: Collin White MRN:  [id number] Principal Diagnosis: Schizophrenia, paranoid type [name]) Diagnosis:   Patient Active Problem List   Diagnosis Date Noted  . Schizophrenia ([place]) [F20.9] 06/30/2017  . Schizophrenia, paranoid type ([place]) [F20.0] 06/08/2017  . 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 Psychoses ([place]) [F29]   . Paranoid schizophrenia ([place]) [F20.0] 09/29/2015  . Cannabis use disorder, moderate, in early remission ([place]) [F12.21] 09/26/2015  . History of posttraumatic stress disorder (PTSD) [Z86.59] 09/26/2015    Total Time spent with patient: 45 minutes  Subjective:   Collin White is a 22 y.o. male patient admitted with paranoid behavior and delusional thinking.   HPI: Pt was seen and chart reviewed with treatment team and Dr Octavio Graves. Pt is unable to add much to the assessment due to his disorganized thinking.  Per chart review; Pt was found in the women's bathroom in [facility] park and had been [employer] by police. Pt was also found naked. Pt has been very disorganized since being in the [facility] and has needed emergency medications for stabilization. Pt's UDS is pending because he has been unable to understand the directions to give the urine needed and BAL is negative. Pt is well known to this hospital system and has had 30 visits in the past 6 months. Pt would benefit from an inpatient psychiatric hospitalization for crisis stabilization and medication management.     Past Psychiatric History: As above  Risk to Self: Suicidal Ideation: No Suicidal Intent: No Is patient at risk for suicide?: No Suicidal Plan?: No Access to Means: No What has been your use of drugs/alcohol within the last 12 months?: unknown How many times?: (Unknown) Other Self Harm Risks: None Triggers for Past Attempts: None known Intentional Self Injurious  Behavior: None Risk to Others: Homicidal Ideation: No Thoughts of Harm to Others: No Current Homicidal Intent: No Current Homicidal Plan: No Access to Homicidal Means: No Identified Victim: No one History of harm to others?: No Assessment of Violence: None Noted Violent Behavior Description: Unknown Does patient have access to weapons?: No Criminal Charges Pending?: No Does patient have a court date: No Prior Inpatient Therapy: Prior Inpatient Therapy: Yes Prior Therapy Dates: Oct & Nov 2018 Prior Therapy Facilty/Provider([facility]): [facility] Reason for Treatment: psychosis Prior Outpatient Therapy: Prior Outpatient Therapy: Yes Prior Therapy Dates: Current? Prior Therapy Facilty/Provider(s): [name] Reason for Treatment: ACTT team services Does patient have an ACCT team?: Yes Does patient have Intensive In-House Services?  : No Does patient have [name] services? : Unknown Does patient have P4CC services?: No  Past Medical History:  Past Medical History:  Diagnosis Date  . Psychoses ([place])   . Schizophrenia ([place])   . Seizures ([place])     Past Surgical History:  Procedure Laterality Date  . abd surgery s/p traumatic event    . facial reconstructive surgery    . NO PAST SURGERIES  patient stated he had facial reconstruction surgery as a child   . 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 Hypertension Other   . Mental illness Neg Hx    Family Psychiatric  History: Unknown Social History:  Social History   Substance and Sexual Activity  Alcohol Use No     Social History   Substance and Sexual Activity  Drug Use No    Social  History   Socioeconomic History  . Marital status: Single    Spouse name: Not on file  . Number of children: Not on file  . Years of education: Not on file  . Highest education level: Not on file  Occupational History  . Not on file  Social Needs  . Financial resource strain: Not on  file  . Food insecurity:    Worry: Not on file    Inability: Not on file  . Transportation needs:    Medical: Not on file    Non-medical: Not on file  Tobacco Use  . Smoking status: Former Smoker    Types: Cigarettes  . Smokeless tobacco: Never Used  Substance and Sexual Activity  . Alcohol use: No  . Drug use: No  . Sexual activity: Yes    Birth control/protection: Condom  Lifestyle  . Physical activity:    Days per week: Not on file    Minutes per session: Not on file  . Stress: Not on file  Relationships  . Social connections:    Talks on phone: Not on file    Gets together: Not on file    Attends religious service: Not on file    Active member of club or organization: Not on file    Attends meetings of clubs or organizations: Not on file    Relationship status: Not on file  Other Topics Concern  . Not on file  Social History Narrative   ** Merged History Encounter **       ** Merged History Encounter **       Additional Social History:    Allergies:  No Known Allergies  Labs:  Results for orders placed or performed during the hospital encounter of 01/02/18 (from the past 48 hour(s))  CBC with Differential     Status: Abnormal   Collection Time: 01/03/18  1:13 AM  Result Value Ref Range   WBC 11.0 (H) 4.0 - 10.5 K/uL   RBC 4.85 4.22 - 5.81 MIL/uL   Hemoglobin 14.5 13.0 - 17.0 g/dL   HCT 41.7 39.0 - 52.0 %   MCV 86.0 78.0 - 100.0 fL   MCH 29.9 26.0 - 34.0 pg   MCHC 34.8 30.0 - 36.0 g/dL   RDW 13.2 11.5 - 15.5 %   Platelets 189 150 - 400 K/uL   Neutrophils Relative % 72 %   Neutro Abs 7.9 (H) 1.7 - 7.7 K/uL   Lymphocytes Relative 17 %   Lymphs Abs 1.9 0.7 - 4.0 K/uL   Monocytes Relative 10 %   Monocytes Absolute 1.1 (H) 0.1 - 1.0 K/uL   Eosinophils Relative 1 %   Eosinophils Absolute 0.1 0.0 - 0.7 K/uL   Basophils Relative 0 %   Basophils Absolute 0.0 0.0 - 0.1 K/uL    Comment: Performed at [facility], [place] [address]  [place], [place] [address]  Basic metabolic panel     Status: Abnormal   Collection Time: 01/03/18  1:13 AM  Result Value Ref Range   Sodium 141 135 - 145 mmol/L   Potassium 3.9 3.5 - 5.1 mmol/L   Chloride 104 101 - 111 mmol/L   CO2 23 22 - 32 mmol/L   Glucose, Bld 93 65 - 99 mg/dL   BUN 21 (H) 6 - 20 mg/dL   Creatinine, Ser 1.30 (H) 0.61 - 1.24 mg/dL   Calcium 10.0 8.9 - 10.3 mg/dL   GFR calc non Af Amer >60 >60 mL/min  GFR calc Af Amer >60 >60 mL/min    Comment: (NOTE) The eGFR has been calculated using the CKD EPI equation. This calculation has not been validated in all clinical situations. eGFR's persistently <60 mL/min signify possible Chronic Kidney Disease.    Anion gap 14 5 - 15    Comment: Performed at [facility], [place] [address], [place], [place] [address]  Ethanol     Status: None   Collection Time: 01/03/18  1:14 AM  Result Value Ref Range   Alcohol, Ethyl (B) <10 <10 mg/dL    Comment: (NOTE) Lowest detectable limit for serum alcohol is 10 mg/dL. For medical purposes only. Performed at [name], [place] [address], [place], [place] [address]   Acetaminophen level     Status: Abnormal   Collection Time: 01/03/18  1:14 AM  Result Value Ref Range   Acetaminophen (Tylenol), Serum <10 (L) 10 - 30 ug/mL    Comment: (NOTE) Therapeutic concentrations vary significantly. A range of 10-30 ug/mL  may be an effective concentration for many patients. However, some  are best treated at concentrations outside of this range. Acetaminophen concentrations >150 ug/mL at 4 hours after ingestion  and >50 ug/mL at 12 hours after ingestion are often associated with  toxic reactions. Performed at [facility], [place] [address], [place],  [address]     Current Facility-Administered Medications  Medication Dose Route Frequency Provider Last Rate Last Dose  . sterile water (preservative free) injection            Current  Outpatient Medications  Medication Sig Dispense Refill  . citalopram (CELEXA) 20 MG tablet Take 1 tablet (20 mg total) by mouth daily. For depression (Patient not taking: Reported on 01/02/2018) 30 tablet 0  . fluticasone (FLONASE) 50 MCG/ACT nasal spray Place 2 sprays into both nostrils daily. (Patient not taking: Reported on 01/02/2018) 9.9 g 2  . fluticasone (FLONASE) 50 MCG/ACT nasal spray Place 2 sprays into both nostrils daily. (Patient not taking: Reported on 01/02/2018) 9.9 g 2  . hydrOXYzine (ATARAX/VISTARIL) 25 MG tablet Take 1 tablet (25 mg total) by mouth every 6 (six) hours as needed for anxiety. (Patient not taking: Reported on 01/02/2018) 60 tablet 0  . ibuprofen (ADVIL,MOTRIN) 200 MG tablet Take 2 tablets (400 mg total) by mouth every 6 (six) hours as needed for moderate pain. (Patient not taking: Reported on 01/02/2018) 30 tablet 0  . OLANZapine (ZYPREXA) 15 MG tablet Take 2 tablets (30 mg total) by mouth at bedtime. For mood control (Patient not taking: Reported on 01/02/2018) 60 tablet 0  . potassium chloride (K-DUR) 10 MEQ tablet Take 1 tablet (10 mEq total) by mouth daily. (Patient not taking: Reported on 01/02/2018) 7 tablet 0  . promethazine (PHENERGAN) 25 MG tablet Take 1 tablet (25 mg total) by mouth every 6 (six) hours as needed for nausea or vomiting. (Patient not taking: Reported on 01/02/2018) 12 tablet 0  . traZODone (DESYREL) 100 MG tablet Take 1 tablet 100 mg by mouth at bedtime: For sleep (Patient not taking: Reported on 08/11/2017) 30 tablet 0    Musculoskeletal: Strength & Muscle Tone: within normal limits Gait & Station: normal Patient leans: N/A  Psychiatric Specialty Exam: Physical Exam  Constitutional: He appears well-developed.  HENT:  Head: Normocephalic.  Respiratory: Effort normal.  Musculoskeletal: Normal range of motion.  Neurological: He is alert.  Psychiatric: His speech is slurred. He is withdrawn. Thought content is paranoid and delusional. Cognition  and memory are normal. He expresses impulsivity. He exhibits a depressed mood.    Review of Systems  Psychiatric/Behavioral: Positive for depression and hallucinations. Negative for memory loss, substance abuse and suicidal ideas. The patient is not nervous/anxious and does not have insomnia.   All other systems reviewed and are negative.   Blood pressure (!) 123/91, pulse 74, temperature 98.2 F (36.8 C), temperature source Oral, resp. rate 18, SpO2 99 %.There is no height or weight on file to calculate BMI.  General Appearance: Disheveled  Eye Contact:  Poor  Speech:  Slow  Volume:  Decreased  Mood:  Depressed and Dysphoric  Affect:  Congruent, Depressed and Flat  Thought Process:  Disorganized  Orientation:  Other:  person  Thought Content:  Illogical, Delusions, Hallucinations: Auditory and Paranoid Ideation  Suicidal Thoughts:  UTA, paranoa  Homicidal Thoughts:  UTA, paranoid  Memory:  Immediate;   Fair Recent;   Fair Remote;   Fair  Judgement:  Impaired  Insight:  Shallow  Psychomotor Activity:  Decreased  Concentration:  Concentration: Fair and Attention Span: Fair  Recall:  [employer] of Knowledge:  Fair  Language:  Good  Akathisia:  No  Handed:  Right  AIMS (if indicated):     Assets:  Communication Skills Social Support  ADL's:  Intact  Cognition:  WNL  Sleep:        Treatment Plan Summary: Daily contact with patient to assess and evaluate symptoms and progress in treatment and Medication management (see MAR)  Disposition: Recommend psychiatric Inpatient admission when medically cleared. TTS to seek placement  Ethelene Hal, NP 01/04/2018 1:57 PM   Patient seen face to face for this evaluation, case discussed with treatment team and physician extender and formulated treatment plan. Reviewed the information documented and agree with the treatment plan.  Ambrose Finland, MD 01/04/2018

## 2018-01-04 NOTE — ED Notes (Signed)
Patient given IM Zyprexa as ordered. Security in room to stand by for medication administration. Upon entering the patient's room, a large volume of urine noted to be in the floor. Patient tolerated injection well; no distress noted.

## 2018-01-04 NOTE — ED Notes (Signed)
Patient took HS medications with much encouragement. Pt sitting up on the bed with no signs of distress at this time.

## 2018-01-04 NOTE — ED Notes (Signed)
Patient currently sleeping in his bed. No distress noted at this time. Respirations regular and unlabored.

## 2018-01-04 NOTE — ED Notes (Signed)
Patient took long shower and put on new paper scrubs at this time. Pt states he feels better. No distress noted.

## 2018-01-04 NOTE — ED Notes (Signed)
Patient in the bathroom for a few minutes, when staff noted on the security monitor that he was placing his fingers inside of his rectum and getting feces on them. Pt becoming agitated and angry with redirection from staff and yelled "Leave me alone, I can do this!". Pt remains confused and uncooperative. Dr Eudelia Bunch notified of behaviors; new orders received.

## 2018-01-05 DIAGNOSIS — F640 Transsexualism: Secondary | ICD-10-CM

## 2018-01-05 DIAGNOSIS — Z87891 Personal history of nicotine dependence: Secondary | ICD-10-CM

## 2018-01-05 LAB — RAPID URINE DRUG SCREEN, HOSP PERFORMED
Amphetamines: NOT DETECTED
BARBITURATES: POSITIVE — AB
Benzodiazepines: NOT DETECTED
Cocaine: NOT DETECTED
Opiates: NOT DETECTED
Tetrahydrocannabinol: NOT DETECTED

## 2018-01-05 LAB — URINALYSIS, ROUTINE W REFLEX MICROSCOPIC
BILIRUBIN URINE: NEGATIVE
Bacteria, UA: NONE SEEN
GLUCOSE, UA: NEGATIVE mg/dL
HGB URINE DIPSTICK: NEGATIVE
KETONES UR: NEGATIVE mg/dL
LEUKOCYTES UA: NEGATIVE
Nitrite: NEGATIVE
PH: 5 (ref 5.0–8.0)
Protein, ur: NEGATIVE mg/dL
Specific Gravity, Urine: 1.023 (ref 1.005–1.030)

## 2018-01-05 MED ORDER — STERILE WATER FOR INJECTION IJ SOLN
INTRAMUSCULAR | Status: AC
  Administered 2018-01-05: 11:00:00
  Filled 2018-01-05: qty 10

## 2018-01-05 NOTE — ED Notes (Signed)
This nurse in pt room with psychiatry team during morning rounds. Pt behavior bizarre, delusional, grandiose. Speech pressured, disorganized. Pt irritable at times. Reports he "owns [employer]", "[employer]" and "owns [employer] in [place]." Pt denies SI/HI/AVH. Paranoia noted.Pt refuses to answer some questions reports "classififed information" Encouragement and support provided. Special checks q 15 mins in place for safety, Video monitoring in place. Will continue to monitor.
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
## 2018-01-05 NOTE — ED Notes (Signed)
Patient sticking his fingers into his anus.

## 2018-01-05 NOTE — Progress Notes (Signed)
CSW received a call from [facility] stating pt is declined, due to "psychiatrist feels pt needs something more long-term".  Please reconsult if future social work needs arise.  CSW signing off, as social work intervention is no longer needed.  Dorothe Pea. Gabrella Stroh, LCSW, LCAS, CSI Clinical Social Worker Ph: [phone]

## 2018-01-05 NOTE — BH Assessment (Signed)
[name] Assessment Progress Note   Per Leata Mouse, MD, this voluntary pt continues to require psychiatric hospitalization at this time.  The following facilities have been contacted to seek placement for this pt, with results as noted:  Beds available, information sent, decision pending:  [facility]   Declined:  [facility] (due to pt acuity) [facility] (due to pt acuity) [facility]  (due to pt acuity)   At capacity:  [facility]    [place], [place] [employer] [facility] Coordinator [phone]

## 2018-01-05 NOTE — ED Notes (Signed)
This nurse informed Jacki Cones NP of pt escalating behavior, instructed to give Zyprexa IM as ordered.

## 2018-01-05 NOTE — ED Notes (Signed)
Pt taking a shower 

## 2018-01-06 NOTE — ED Notes (Signed)
[place] id found in patients belongings indicate her DOB is [birth year].  Copy made for registration who will follow up.

## 2018-01-06 NOTE — ED Notes (Signed)
Pt  in the bathroom and was noted to be putting his hand at his rectal area.  Pt redirected by [facility] and stopped.

## 2018-01-06 NOTE — ED Notes (Signed)
Pt up in room, small amt of fecal material noted on sheets, bedding changed.  Pt asked about anxiety, and if medication may help.  Pt reported that he was unsure.
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
## 2018-01-06 NOTE — ED Notes (Addendum)
Pt observed scratching at her rectum with her hand.  Attempted to redirect pt.  Pt denies rectal pain/itching/constipation and reports that it is a "personal thing" and that she does it "24/7" at home. Pt reports that there is none thing that she does at home that can  distract herself from doing this and that there is nothing that we can do.  Pt encouraged not to do this.

## 2018-01-06 NOTE — ED Notes (Signed)
Up tot he bathroom to shower and change scrubs 

## 2018-01-06 NOTE — ED Notes (Signed)
Smeared fecal matter noted on sheets, bedding changed

## 2018-01-06 NOTE — BH Assessment (Signed)
[name] Assessment Progress Note  Annette Stable, MD, thisvoluntarypt continues to require psychiatric hospitalization at this time. The following facilities have been contacted to seek placement for this pt, with results as noted:  Beds available, information sent, decision pending:  [facility]   Declined:  [facility] (due to pt acuity) [facility] (due to pt acuity) [facility] (due to pt acuity)  At capacity:  [facility] [place], [place] [facility] Coord[facility] [phone]

## 2018-01-06 NOTE — ED Notes (Addendum)
Dr Elsie Saas and Jacki Cones NP into see.  Pt denies current si/hi/avh/paranoia at this time.  Pt reports that she lives in [place] has a Dealer.Marland Kitchenis rich... and has 2 jewelry stores."  Pt reports that she has a partner, 6 kids and family in [place].  Pt also reports that she is living in [place] near [place] and also lives in the [place] dorms.  Pt reports that she works at a [employer] here in town.  Pt agreed to be admitted for treatment.

## 2018-01-06 NOTE — ED Notes (Signed)
Patient has been calm and cooperative during shift. Patient has not had any inappropriate behavior during this shift.

## 2018-01-06 NOTE — ED Notes (Signed)
Pt oriented x3, but reported that she was 22 yrs old, born in [birth year] and her name was Collin White.  Pt did report that her name was "Collin White"

## 2018-01-06 NOTE — ED Notes (Signed)
Pt awake, alert & responsive, no distress noted, pt continues to stick finger up rectum.  Monitoring for safety, Q 15 min checks in effect.  Pt calm & cooperative at present.

## 2018-01-07 ENCOUNTER — Inpatient Hospital Stay (HOSPITAL_COMMUNITY)
Admission: AD | Admit: 2018-01-07 | Discharge: 2018-01-14 | DRG: 885 | Disposition: A | Discharge status: Home / Self Care | Payer: No Typology Code available for payment source | Source: Intra-hospital | Attending: Psychiatry | Admitting: Psychiatry

## 2018-01-07 ENCOUNTER — Encounter (HOSPITAL_COMMUNITY): Payer: Self-pay

## 2018-01-07 ENCOUNTER — Other Ambulatory Visit: Payer: Self-pay

## 2018-01-07 DIAGNOSIS — F17210 Nicotine dependence, cigarettes, uncomplicated: Secondary | ICD-10-CM | POA: Diagnosis present

## 2018-01-07 DIAGNOSIS — F200 Paranoid schizophrenia: Secondary | ICD-10-CM | POA: Diagnosis present

## 2018-01-07 DIAGNOSIS — F649 Gender identity disorder, unspecified: Secondary | ICD-10-CM | POA: Diagnosis present

## 2018-01-07 DIAGNOSIS — F329 Major depressive disorder, single episode, unspecified: Secondary | ICD-10-CM | POA: Diagnosis present

## 2018-01-07 DIAGNOSIS — F640 Transsexualism: Secondary | ICD-10-CM | POA: Diagnosis not present

## 2018-01-07 DIAGNOSIS — Z87891 Personal history of nicotine dependence: Secondary | ICD-10-CM | POA: Diagnosis not present

## 2018-01-07 DIAGNOSIS — F419 Anxiety disorder, unspecified: Secondary | ICD-10-CM | POA: Diagnosis not present

## 2018-01-07 DIAGNOSIS — R450 Nervousness: Secondary | ICD-10-CM

## 2018-01-07 DIAGNOSIS — R4183 Borderline intellectual functioning: Secondary | ICD-10-CM | POA: Diagnosis present

## 2018-01-07 DIAGNOSIS — R569 Unspecified convulsions: Secondary | ICD-10-CM | POA: Diagnosis present

## 2018-01-07 DIAGNOSIS — F29 Unspecified psychosis not due to a substance or known physiological condition: Secondary | ICD-10-CM

## 2018-01-07 DIAGNOSIS — Z79899 Other long term (current) drug therapy: Secondary | ICD-10-CM | POA: Diagnosis not present

## 2018-01-07 DIAGNOSIS — G4700 Insomnia, unspecified: Secondary | ICD-10-CM | POA: Diagnosis not present

## 2018-01-07 DIAGNOSIS — R451 Restlessness and agitation: Secondary | ICD-10-CM | POA: Diagnosis not present

## 2018-01-07 MED ORDER — PALIPERIDONE ER 6 MG PO TB24
9.0000 mg | ORAL_TABLET | Freq: Every day | ORAL | Status: DC
  Administered 2018-01-07: 9 mg via ORAL
  Filled 2018-01-07: qty 1

## 2018-01-07 MED ORDER — ALUM & MAG HYDROXIDE-SIMETH 200-200-20 MG/5ML PO SUSP: 30.0000 mL | ORAL | Status: DC | PRN

## 2018-01-07 MED ORDER — PALIPERIDONE ER 6 MG PO TB24
6.0000 mg | ORAL_TABLET | Freq: Every day | ORAL | Status: DC
Start: 2018-01-07 — End: 2018-01-07
  Filled 2018-01-07: qty 1

## 2018-01-07 MED ORDER — HYDROXYZINE HCL 25 MG PO TABS: 25.0000 mg | ORAL_TABLET | Freq: Four times a day (QID) | ORAL | Status: DC | PRN

## 2018-01-07 MED ORDER — PALIPERIDONE ER 6 MG PO TB24
9.0000 mg | ORAL_TABLET | Freq: Every day | ORAL | Status: DC
  Administered 2018-01-08 – 2018-01-14 (×7): 9 mg via ORAL
  Filled 2018-01-07 (×10): qty 1

## 2018-01-07 MED ORDER — ACETAMINOPHEN 325 MG PO TABS: 650.0000 mg | ORAL_TABLET | Freq: Four times a day (QID) | ORAL | Status: DC | PRN

## 2018-01-07 MED ORDER — CITALOPRAM HYDROBROMIDE 20 MG PO TABS
20.0000 mg | ORAL_TABLET | Freq: Every day | ORAL | Status: DC
  Administered 2018-01-08 – 2018-01-14 (×7): 20 mg via ORAL
  Filled 2018-01-07 (×8): qty 1
  Filled 2018-01-07: qty 7
  Filled 2018-01-07: qty 1

## 2018-01-07 MED ORDER — TRAZODONE HCL 100 MG PO TABS
100.0000 mg | ORAL_TABLET | Freq: Every day | ORAL | Status: DC
  Administered 2018-01-08 – 2018-01-13 (×5): 100 mg via ORAL
  Filled 2018-01-07 (×9): qty 1
  Filled 2018-01-07: qty 7
  Filled 2018-01-07: qty 1
  Filled 2018-01-07: qty 7

## 2018-01-07 MED ORDER — MAGNESIUM HYDROXIDE 400 MG/5ML PO SUSP: 30.0000 mL | Freq: Every day | ORAL | Status: DC | PRN

## 2018-01-07 NOTE — Plan of Care (Signed)
D: Pt continues to be paranoid on the unit, pt keeps to himself this evening, pt forwards little information at this time A: Pt was offered support and encouragement. Pt was encourage to attend groups. Q 15 minute checks were done for safety.  R: safety maintained on unit.   Problem: Coping: Goal: Ability to identify and develop effective coping behavior will improve Outcome: Not Progressing   Problem: Self-Concept: Goal: Will verbalize positive feelings about self Outcome: Not Progressing

## 2018-01-07 NOTE — Progress Notes (Signed)
Admission Note: Patient is an 22 year old male who identifies as male.  Patient is admitted to the 500 unit for paranoid behavior and delusional thinking.  Patient presents with flat affect and depressed mood.  States he doesn't know why he is here.  Patient only forward little during admission process.  Denies suicidal thoughts, auditory and visual hallucinations.  Admission plan of care reviewed and consent for treatment signed.  Skin assessment and personal belonging assessed.  Skin is dry and intact.  No contraband found.  Patient oriented to the unit, staff and room.  Routine safety checks initiated.  Patient is safe on the unit.

## 2018-01-07 NOTE — ED Notes (Signed)
[facility] on unit to transfer pt to [name] Adult unit per MD order. Personal property given to [employer] for transfer. Pt ambulatory off unit in police custody.  

## 2018-01-07 NOTE — Consult Note (Addendum)
[name] Face-to-Face Psychiatry Consult   Reason for Consult:  Psychosis  Referring Physician:  EDP Patient Identification: Collin White MRN:  [id number] Principal Diagnosis: Paranoid schizophrenia [facility]) Diagnosis:   Patient Active Problem List   Diagnosis Date Noted  . Paranoid schizophrenia (HCC) [F20.0] 09/29/2015    Priority: High  . Acute episode of schizophrenia with history of multiple episodes (HCC) [F23] 06/30/2017  . Schizophrenia, paranoid type (HCC) [F20.0] 06/08/2017  . 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 History of posttraumatic stress disorder (PTSD) [Z86.59] 09/26/2015    Total Time spent with patient: 45 minutes  Subjective:   Collin White is a 22 y.o. male patient admitted with psychosis.  HPI:  22 yo male who came to the ED with severe psychosis.  He continues to be psychotic despite medications.  Medications adjusted today, Zyprexa 30 mg at bedtime discontinued and Invega 9 mg daily started in the hopes to give a monthly injection for stability as compliance is an issue.  Calmly resting but continues to do disturbing things like defecating and smearing it in his room.  Past Psychiatric History: Paranoid schizophrenia, PTSD  Risk to Self: Suicidal Ideation: No Suicidal Intent: No Is patient at risk for suicide?: No Suicidal Plan?: No Access to Means: No What has been your use of drugs/alcohol within the last 12 months?: unknown How many times?: (Unknown) Other Self Harm Risks: None Triggers for Past Attempts: None known Intentional Self Injurious Behavior: None Risk to Others: Homicidal Ideation: No Thoughts of Harm to Others: No Current Homicidal Intent: No Current Homicidal Plan: No Access to Homicidal Means: No Identified Victim: No one History of harm to others?: No Assessment of Violence: None Noted Violent Behavior  Description: Unknown Does patient have access to weapons?: No Criminal Charges Pending?: No Does patient have a court date: No Prior Inpatient Therapy: Prior Inpatient Therapy: Yes Prior Therapy Dates: Oct & Nov 2018 Prior Therapy Facilty/Provider(s): [facility] Reason for Treatment: psychosis Prior Outpatient Therapy: Prior Outpatient Therapy: Yes Prior Therapy Dates: Current? Prior Therapy Facilty/Provider(s): [name] Reason for Treatment: ACTT team services Does patient have an ACCT team?: Yes Does patient have Intensive In-House Services?  : No Does patient have [name] services? : Unknown Does patient have P4CC services?: No  Past Medical History:  Past Medical History:  Diagnosis Date  . Psychoses (HCC)   . Schizophrenia (HCC)   . Seizures (HCC)     Past Surgical History:  Procedure Laterality Date  . abd surgery s/p traumatic event    . facial reconstructive surgery    . NO PAST SURGERIES  patient stated he had facial reconstruction surgery as a child   . 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 Hypertension Other   . Mental illness Neg Hx    Family Psychiatric  History: none Social History:  Social History   Substance and Sexual Activity  Alcohol Use No     Social History   Substance and Sexual Activity  Drug Use No    Social History   Socioeconomic History  . Marital status: Single    Spouse name: Not on file  . Number of children: Not on file  . Years of education: Not on file  . Highest education level: Not on file  Occupational History  . Not on file  Social Needs  . Financial resource strain: Not on file  . Food insecurity:    Worry: Not on file    Inability: Not on file  . Transportation needs:    Medical: Not on file    Non-medical: Not on file  Tobacco Use  . Smoking status: Former Smoker    Types: Cigarettes  . Smokeless tobacco: Never Used  Substance and Sexual Activity   . Alcohol use: No  . Drug use: No  . Sexual activity: Yes    Birth control/protection: Condom  Lifestyle  . Physical activity:    Days per week: Not on file    Minutes per session: Not on file  . Stress: Not on file  Relationships  . Social connections:    Talks on phone: Not on file    Gets together: Not on file    Attends religious service: Not on file    Active member of club or organization: Not on file    Attends meetings of clubs or organizations: Not on file    Relationship status: Not on file  Other Topics Concern  . Not on file  Social History Narrative   ** Merged History Encounter **       ** Merged History Encounter **       Additional Social History: N/A    Allergies:  No Known Allergies  Labs:  Results for orders placed or performed during the hospital encounter of 01/02/18 (from the past 48 hour(s))  Rapid urine drug screen (hospital performed)     Status: Abnormal   Collection Time: 01/05/18  9:05 PM  Result Value Ref Range   Opiates NONE DETECTED NONE DETECTED   Cocaine NONE DETECTED NONE DETECTED   Benzodiazepines NONE DETECTED NONE DETECTED   Amphetamines NONE DETECTED NONE DETECTED   Tetrahydrocannabinol NONE DETECTED NONE DETECTED   Barbiturates POSITIVE (A) NONE DETECTED    Comment: (NOTE) DRUG SCREEN FOR MEDICAL PURPOSES ONLY.  IF CONFIRMATION IS NEEDED FOR ANY PURPOSE, NOTIFY LAB WITHIN 5 DAYS. LOWEST DETECTABLE LIMITS FOR URINE DRUG SCREEN Drug Class                     Cutoff (ng/mL) Amphetamine and metabolites    1000 Barbiturate and metabolites    200 Benzodiazepine                 200 Tricyclics and metabolites     300 Opiates and metabolites        300 Cocaine and metabolites        300 THC                            50 Performed at [facility], [address], [place], [place] [address]   Urinalysis, Routine w reflex microscopic     Status: None   Collection Time: 01/05/18  9:06 PM  Result Value Ref  Range   Color, Urine YELLOW YELLOW   APPearance CLEAR CLEAR   Specific Gravity, Urine 1.023 1.005 - 1.030   pH 5.0 5.0 - 8.0   Glucose, UA NEGATIVE NEGATIVE mg/dL   Hgb urine dipstick NEGATIVE NEGATIVE   Bilirubin Urine NEGATIVE NEGATIVE   Ketones, ur NEGATIVE NEGATIVE mg/dL   Protein, ur NEGATIVE NEGATIVE mg/dL   Nitrite NEGATIVE NEGATIVE   Leukocytes, UA NEGATIVE NEGATIVE   RBC / HPF 0-5 0 - 5 RBC/hpf   WBC, UA 0-5 0 - 5  WBC/hpf   Bacteria, UA NONE SEEN NONE SEEN   Squamous Epithelial / LPF 0-5 0 - 5   Mucus PRESENT     Comment: Performed at [facility], [address] [place], [place] [address]    Current Facility-Administered Medications  Medication Dose Route Frequency Provider Last Rate Last Dose  . citalopram (CELEXA) tablet 20 mg  20 mg Oral Daily Cardama, Amadeo Garnet, MD   20 mg at 01/07/18 1026  . hydrOXYzine (ATARAX/VISTARIL) tablet 25 mg  25 mg Oral Q6H PRN Nira Conn, MD   25 mg at 01/06/18 1830  . paliperidone (INVEGA) 24 hr tablet 6 mg  6 mg Oral Daily Lord, Jamison Y, NP      . traZODone (DESYREL) tablet 100 mg  100 mg Oral QHS Nira Conn, MD   100 mg at 01/06/18 2144   Current Outpatient Medications  Medication Sig Dispense Refill  . citalopram (CELEXA) 20 MG tablet Take 1 tablet (20 mg total) by mouth daily. For depression (Patient not taking: Reported on 01/02/2018) 30 tablet 0  . fluticasone (FLONASE) 50 MCG/ACT nasal spray Place 2 sprays into both nostrils daily. (Patient not taking: Reported on 01/02/2018) 9.9 g 2  . fluticasone (FLONASE) 50 MCG/ACT nasal spray Place 2 sprays into both nostrils daily. (Patient not taking: Reported on 01/02/2018) 9.9 g 2  . hydrOXYzine (ATARAX/VISTARIL) 25 MG tablet Take 1 tablet (25 mg total) by mouth every 6 (six) hours as needed for anxiety. (Patient not taking: Reported on 01/02/2018) 60 tablet 0  . ibuprofen (ADVIL,MOTRIN) 200 MG tablet Take 2 tablets (400 mg total) by mouth every 6  (six) hours as needed for moderate pain. (Patient not taking: Reported on 01/02/2018) 30 tablet 0  . OLANZapine (ZYPREXA) 15 MG tablet Take 2 tablets (30 mg total) by mouth at bedtime. For mood control (Patient not taking: Reported on 01/02/2018) 60 tablet 0  . potassium chloride (K-DUR) 10 MEQ tablet Take 1 tablet (10 mEq total) by mouth daily. (Patient not taking: Reported on 01/02/2018) 7 tablet 0  . promethazine (PHENERGAN) 25 MG tablet Take 1 tablet (25 mg total) by mouth every 6 (six) hours as needed for nausea or vomiting. (Patient not taking: Reported on 01/02/2018) 12 tablet 0  . traZODone (DESYREL) 100 MG tablet Take 1 tablet 100 mg by mouth at bedtime: For sleep (Patient not taking: Reported on 08/11/2017) 30 tablet 0    Musculoskeletal: Strength & Muscle Tone: within normal limits Gait & Station: normal Patient leans: N/A  Psychiatric Specialty Exam: Physical Exam  Nursing note and vitals reviewed. Constitutional: He appears well-developed.  thin  HENT:  Head: Normocephalic.  Neck: Normal range of motion.  Respiratory: Effort normal.  Musculoskeletal: Normal range of motion.  Neurological: He is alert.  Psychiatric: His speech is normal. His mood appears anxious. His affect is labile. He is actively hallucinating. Thought content is paranoid. Cognition and memory are impaired. He expresses inappropriate judgment.    Review of Systems  Psychiatric/Behavioral: Positive for hallucinations. The patient is nervous/anxious.   All other systems reviewed and are negative.   Blood pressure 124/74, pulse 60, temperature 98.2 F (36.8 C), resp. rate 18, SpO2 98 %.There is no height or weight on file to calculate BMI.  General Appearance: Disheveled  Eye Contact:  Fair  Speech:  Normal Rate  Volume:  Normal  Mood:  Anxious  Affect:  Flat  Thought Process:  Coherent and Descriptions of Associations: Intact  Orientation:  Other:  person  Thought Content:  Hallucinations:  Auditory Visual  Suicidal Thoughts:  No  Homicidal Thoughts:  No  Memory:  Immediate;   Poor Recent;   Poor Remote;   Poor  Judgement:  Impaired  Insight:  Lacking  Psychomotor Activity:  Decreased  Concentration:  Concentration: Poor and Attention Span: Poor  Recall:  Poor  Fund of Knowledge:  Fair  Language:  Fair  Akathisia:  No  Handed:  Right  AIMS (if indicated):   N/A  Assets:  Leisure Time Physical Health Resilience  ADL's:  Intact  Cognition:  Impaired,  Moderate  Sleep:   N/A     Treatment Plan Summary: Daily contact with patient to assess and evaluate symptoms and progress in treatment, Medication management and Plan paranoid schizophrenia:  -Crisis stabilization -Medication management:  Discontinue Zyprexa 30 mg at bedtime for psychosis, started Invega 9 mg daily for psychosis.  Continue Celexa 20 mg daily for depression, vistaril 25 mg every six hours PRN anxiety, and Trazodone 100 mg at bedtime for sleep -Individual counseling  Disposition: Recommend psychiatric Inpatient admission when medically cleared.  Nanine Means, NP 01/07/2018 10:43 AM   Patient seen face-to-face for psychiatric evaluation, chart reviewed and case discussed with the physician extender and developed treatment plan. Reviewed the information documented and agree with the treatment plan.  Juanetta Beets, DO 01/07/18 6:35 PM

## 2018-01-07 NOTE — Progress Notes (Signed)
Did not attend group 

## 2018-01-07 NOTE — Tx Team (Signed)
Initial Treatment Plan 01/07/2018 4:59 PM [name] MVH:[id number]    PATIENT STRESSORS: Financial difficulties Health problems Legal issue Medication change or noncompliance   PATIENT STRENGTHS: Communication skills   PATIENT IDENTIFIED PROBLEMS: Anxiety  Ineffective coping skills  Depression  Medication Noncompliance               DISCHARGE CRITERIA:  Adequate post-discharge living arrangements Reduction of life-threatening or endangering symptoms to within safe limits  PRELIMINARY DISCHARGE PLAN: Attend aftercare/continuing care group Outpatient therapy  PATIENT/FAMILY INVOLVEMENT: This treatment plan has been presented to and reviewed with the patient, Collin White, and/or family member.  The patient and family have been given the opportunity to ask questions and make suggestions.  Clarene Critchley, RN 01/07/2018, 4:59 PM

## 2018-01-07 NOTE — BH Assessment (Signed)
[facility] Assessment Progress Note  Per Juanetta Beets, DO, this pt requires psychiatric hospitalization.  Dr Sharma Covert also finds that pt meets criteria for IVC, which she has initiated.  IVC documents have been faxed to [name], and at 11:35 Burna Cash confirms receipt.  She has since faxed Findings and Custody Order to this Clinical research associate.  At 11:58 I called [employer] and spoke to [employer], who took demographic information, agreeing to dispatch law enforcement to fill out Return of Service.  Law enforcement then presented at [facility], completing Return of Service.  Malva Limes, RN,  [facility] has assigned pt to [facility] Rm [id number].  IVC documents have been faxed to [name].  Pt's nurse, Morrie Sheldon, has been notified, and agrees to call report to [phone].  Pt is to be transported via Patent examiner.   Doylene Canning, [place] Behavioral Health Coordinator [phone]

## 2018-01-07 NOTE — ED Notes (Signed)
[employer] called for transport 

## 2018-01-07 NOTE — ED Notes (Signed)
Pt's linen changed due to fecal materials on sheets.

## 2018-01-08 DIAGNOSIS — F200 Paranoid schizophrenia: Principal | ICD-10-CM

## 2018-01-08 DIAGNOSIS — Z79899 Other long term (current) drug therapy: Secondary | ICD-10-CM

## 2018-01-08 MED ORDER — HYDROXYZINE HCL 50 MG PO TABS
50.0000 mg | ORAL_TABLET | Freq: Four times a day (QID) | ORAL | Status: DC | PRN
  Administered 2018-01-12 – 2018-01-13 (×2): 50 mg via ORAL
  Filled 2018-01-08: qty 10
  Filled 2018-01-08 (×2): qty 1

## 2018-01-08 MED ORDER — HALOPERIDOL 5 MG PO TABS: 5.0000 mg | ORAL_TABLET | Freq: Four times a day (QID) | ORAL | Status: DC | PRN

## 2018-01-08 MED ORDER — BENZTROPINE MESYLATE 1 MG/ML IJ SOLN: 1.0000 mg | Freq: Two times a day (BID) | INTRAMUSCULAR | Status: DC | PRN

## 2018-01-08 MED ORDER — BENZTROPINE MESYLATE 1 MG PO TABS: 1.0000 mg | ORAL_TABLET | Freq: Two times a day (BID) | ORAL | Status: DC | PRN

## 2018-01-08 MED ORDER — PALIPERIDONE PALMITATE ER 156 MG/ML IM SUSY: 156.0000 mg | PREFILLED_SYRINGE | INTRAMUSCULAR | Status: DC

## 2018-01-08 MED ORDER — HALOPERIDOL LACTATE 5 MG/ML IJ SOLN: 5.0000 mg | Freq: Four times a day (QID) | INTRAMUSCULAR | Status: DC | PRN

## 2018-01-08 MED ORDER — PALIPERIDONE PALMITATE ER 234 MG/1.5ML IM SUSY
234.0000 mg | PREFILLED_SYRINGE | Freq: Once | INTRAMUSCULAR | Status: AC
  Administered 2018-01-08: 234 mg via INTRAMUSCULAR
  Filled 2018-01-08: qty 1.5

## 2018-01-08 NOTE — [facility] Counselor (Signed)
Adult Comprehensive Assessment  Patient ID: Collin White, male   DOB: 08/31/1995, 22 y.o.   MRN: [id number]  Information Source: Information source: Patient  Current Stressors:  Patient states their primary concerns and needs for treatment are:: Lack on insight Patient states their goals for this hospitilization and ongoing recovery are:: "I just want to leave and be alright." Employment / Job issues: Unemployed Family Relationships: States her father is Surveyor, mining / Lack of resources (include bankruptcy): Dependent on others Housing / Lack of housing: States she has 3 different places in which she can stay  Living/Environment/Situation:  Living Arrangements: Other (Comment) Living conditions (as described by patient or guardian): States she has 3 different places in which she can stay  Family History:  Are you sexually active?: No What is your sexual orientation?: straight-transgender male to male Does patient have children?: Yes How many children?: 4 How is patient's relationship with their children?: "They are all adults.  I started having kids when I was 11."  Childhood History:  By whom was/is the patient raised?: Father, Other (Comment) Additional childhood history information: Raised by dad and uncles  Description of patient's relationship with caregiver when they were a child: Very close relationship Patient's description of current relationship with people who raised him/her: Good  Mother deceased when pt was latency age Does patient have siblings?: No Did patient suffer any verbal/emotional/physical/sexual abuse as a child?: No Did patient suffer from severe childhood neglect?: No Has patient ever been sexually abused/assaulted/raped as an adolescent or adult?: No Was the patient ever a victim of a crime or a disaster?: No Witnessed domestic violence?: No Has patient been effected by domestic violence as an adult?: No  Education:  Highest grade of school  patient has completed: 10 Currently a Consulting civil engineer?: No Learning disability?: No  Employment/Work Situation:   Employment situation: Unemployed Patient's job has been impacted by current illness: No What is the longest time patient has a held a job?: Almost a year  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 [employer]  Are There Guns or Other Weapons in Your Home?: No  Financial Resources:   Financial resources: No income Does patient have a Lawyer or guardian?: No  Alcohol/Substance Abuse:   What has been your use of drugs/alcohol within the last 12 months?: Denies  Positive for barbiturates Alcohol/Substance Abuse Treatment Hx: Denies past history Has alcohol/substance abuse ever caused legal problems?: No  Social Support System:   Conservation officer, nature Support System: Fair Development worker, community Support System: "My father and my fiance" Type of faith/religion: N/A How does patient's faith help to cope with current illness?: N/A  Leisure/Recreation:   Leisure and Hobbies: Singing, rapping, beat boxing, break dancing  Strengths/Needs:   What is the patient's perception of their strengths?: Break dancing and rapping Patient states they can use these personal strengths during their treatment to contribute to their recovery: No Patient states these barriers may affect/interfere with their treatment: None Patient states these barriers may affect their return to the community: None  "There's no such word as can't."  Discharge Plan:   Currently receiving community mental health services: No Patient states concerns and preferences for aftercare planning are: Willing to get on injection-states she will follow up at [facility] Patient states they will know when they are safe and ready for discharge when: "you tell me I can go." Does patient have access to transportation?: Yes Does patient have financial barriers related to discharge medications?: Yes Patient  description of barriers related to discharge medications: No income, no insurance Will patient be returning to same living situation after discharge?: Yes  Summary/Recommendations:   Summary and Recommendations (to be completed by the evaluator): "Collin White" is a 22 YO AA transgendered male to male patient diagnosed with Schizophrenia.  She presents IVC'd with disorganization, somatic complaints and limited insight. As such, the information gathered in the PSA is suspect in terms of it's reliability.  At d/c, she will return to one of her 3 homes and follow up at [facility].  While here, Collin White can benefit from crises stabilization, medication management, therapeutic milieu and referral for services.  Ida Rogue. 01/08/2018

## 2018-01-08 NOTE — Tx Team (Signed)
Interdisciplinary Treatment and Diagnostic Plan Update  01/08/2018 Time of Session: 3:42 PM  Collin White MRN: [id number]  Principal Diagnosis: Paranoid schizophrenia [facility])  Secondary Diagnoses: Principal Problem:   Paranoid schizophrenia ([place])   Current Medications:  Current Facility-Administered Medications  Medication Dose Route Frequency Provider Last Rate Last Dose  . acetaminophen (TYLENOL) tablet 650 mg  650 mg Oral Q6H PRN Patrecia Pour, NP      . alum & mag hydroxide-simeth (MAALOX/MYLANTA) 200-200-20 MG/5ML suspension 30 mL  30 mL Oral Q4H PRN Patrecia Pour, NP      . benztropine (COGENTIN) tablet 1 mg  1 mg Oral BID PRN Pennelope Bracken, MD       Or  . benztropine mesylate (COGENTIN) injection 1 mg  1 mg Intramuscular BID PRN Pennelope Bracken, MD      . citalopram (CELEXA) tablet 20 mg  20 mg Oral Daily Patrecia Pour, NP   20 mg at 01/08/18 0746  . haloperidol (HALDOL) tablet 5 mg  5 mg Oral Q6H PRN Pennelope Bracken, MD       Or  . haloperidol lactate (HALDOL) injection 5 mg  5 mg Intramuscular Q6H PRN Pennelope Bracken, MD      . hydrOXYzine (ATARAX/VISTARIL) tablet 50 mg  50 mg Oral Q6H PRN Pennelope Bracken, MD      . magnesium hydroxide (MILK OF MAGNESIA) suspension 30 mL  30 mL Oral Daily PRN Patrecia Pour, NP      . Derrill Memo ON 02/05/2018] paliperidone (INVEGA SUSTENNA) injection 156 mg  156 mg Intramuscular Q28 days Maris Berger T, MD      . paliperidone (INVEGA) 24 hr tablet 9 mg  9 mg Oral Daily Patrecia Pour, NP   9 mg at 01/08/18 0746  . traZODone (DESYREL) tablet 100 mg  100 mg Oral QHS Patrecia Pour, NP        PTA Medications: Medications Prior to Admission  Medication Sig Dispense Refill Last Dose  . citalopram (CELEXA) 20 MG tablet Take 1 tablet (20 mg total) by mouth daily. For depression (Patient not taking: Reported on 01/02/2018) 30 tablet 0 Not Taking at Unknown time  . fluticasone (FLONASE) 50  MCG/ACT nasal spray Place 2 sprays into both nostrils daily. (Patient not taking: Reported on 01/02/2018) 9.9 g 2 Not Taking at Unknown time  . fluticasone (FLONASE) 50 MCG/ACT nasal spray Place 2 sprays into both nostrils daily. (Patient not taking: Reported on 01/02/2018) 9.9 g 2 Not Taking at Unknown time  . hydrOXYzine (ATARAX/VISTARIL) 25 MG tablet Take 1 tablet (25 mg total) by mouth every 6 (six) hours as needed for anxiety. (Patient not taking: Reported on 01/02/2018) 60 tablet 0 Not Taking at Unknown time  . ibuprofen (ADVIL,MOTRIN) 200 MG tablet Take 2 tablets (400 mg total) by mouth every 6 (six) hours as needed for moderate pain. (Patient not taking: Reported on 01/02/2018) 30 tablet 0 Not Taking at Unknown time  . OLANZapine (ZYPREXA) 15 MG tablet Take 2 tablets (30 mg total) by mouth at bedtime. For mood control (Patient not taking: Reported on 01/02/2018) 60 tablet 0 Not Taking at Unknown time  . potassium chloride (K-DUR) 10 MEQ tablet Take 1 tablet (10 mEq total) by mouth daily. (Patient not taking: Reported on 01/02/2018) 7 tablet 0 Not Taking at Unknown time  . promethazine (PHENERGAN) 25 MG tablet Take 1 tablet (25 mg total) by mouth every 6 (six) hours as needed for nausea or  vomiting. (Patient not taking: Reported on 01/02/2018) 12 tablet 0 Not Taking at Unknown time  . traZODone (DESYREL) 100 MG tablet Take 1 tablet 100 mg by mouth at bedtime: For sleep (Patient not taking: Reported on 08/11/2017) 30 tablet 0 Not Taking at Unknown time    Patient Stressors: Financial difficulties Health problems Legal issue Medication change or noncompliance  Patient Strengths: Communication skills  Treatment Modalities: Medication Management, Group therapy, Case management,  1 to 1 session with clinician, Psychoeducation, Recreational therapy.   Physician Treatment Plan for Primary Diagnosis: Paranoid schizophrenia ([place]) Long Term Goal(s): Improvement in symptoms so as ready for  discharge  Short Term Goals: Ability to identify and develop effective coping behaviors will improve Compliance with prescribed medications will improve  Medication Management: Evaluate patient's response, side effects, and tolerance of medication regimen.  Therapeutic Interventions: 1 to 1 sessions, Unit Group sessions and Medication administration.  Evaluation of Outcomes: Progressing  Physician Treatment Plan for Secondary Diagnosis: Principal Problem:   Paranoid schizophrenia ([place])   Long Term Goal(s): Improvement in symptoms so as ready for discharge  Short Term Goals: Ability to identify and develop effective coping behaviors will improve Compliance with prescribed medications will improve  Medication Management: Evaluate patient's response, side effects, and tolerance of medication regimen.  Therapeutic Interventions: 1 to 1 sessions, Unit Group sessions and Medication administration.  Evaluation of Outcomes: Progressing   RN Treatment Plan for Primary Diagnosis: Paranoid schizophrenia ([place]) Long Term Goal(s): Knowledge of disease and therapeutic regimen to maintain health will improve  Short Term Goals: Ability to identify and develop effective coping behaviors will improve and Compliance with prescribed medications will improve  Medication Management: RN will administer medications as ordered by provider, will assess and evaluate patient's response and provide education to patient for prescribed medication. RN will report any adverse and/or side effects to prescribing provider.  Therapeutic Interventions: 1 on 1 counseling sessions, Psychoeducation, Medication administration, Evaluate responses to treatment, Monitor vital signs and CBGs as ordered, Perform/monitor CIWA, COWS, AIMS and Fall Risk screenings as ordered, Perform wound care treatments as ordered.  Evaluation of Outcomes: Progressing   LCSW Treatment Plan for Primary Diagnosis: Paranoid schizophrenia  ([place]) Long Term Goal(s): Safe transition to appropriate next level of care at discharge, Engage patient in therapeutic group addressing interpersonal concerns.  Short Term Goals: Engage patient in aftercare planning with referrals and resources  Therapeutic Interventions: Assess for all discharge needs, 1 to 1 time with Social worker, Explore available resources and support systems, Assess for adequacy in community support network, Educate family and significant other(s) on suicide prevention, Complete Psychosocial Assessment, Interpersonal group therapy.  Evaluation of Outcomes: Met  Return home, follow up [name]   Progress in Treatment: Attending groups: No Participating in groups: No Taking medication as prescribed: Yes Toleration medication: Yes, no side effects reported at this time Family/Significant other contact made: No Patient understands diagnosis: No Limited insight Discussing patient identified problems/goals with staff: Yes Medical problems stabilized or resolved: Yes Denies suicidal/homicidal ideation: Yes Issues/concerns per patient self-inventory: None Other: N/A  New problem(s) identified: None identified at this time.   New Short Term/Long Term Goal(s): "I just want to leave here and be alright."   Discharge Plan or Barriers:   Reason for Continuation of Hospitalization:  Delusions  Reality testing Medication stabilization   Estimated Length of Stay: 5/29  Attendees: Patient: Collin White 01/08/2018  3:42 PM  Physician: Maris Berger, MD 01/08/2018  3:42 PM  Nursing: Sena Hitch, RN 01/08/2018  3:42 PM  RN Care Manager: Lars Pinks, RN 01/08/2018  3:42 PM  Social Worker: Ripley Fraise 01/08/2018  3:42 PM  Recreational Therapist: Winfield Cunas 01/08/2018  3:42 PM  Other: Norberto Sorenson 01/08/2018  3:42 PM  Other:  01/08/2018  3:42 PM    Scribe for Treatment Team:  Roque Lias LCSW 01/08/2018 3:42 PM

## 2018-01-08 NOTE — Plan of Care (Signed)
  Problem: Education: Goal: Knowledge of [facility] Education information/materials will improve Outcome: Not Progressing   Problem: Coping: Goal: Ability to identify and develop effective coping behavior will improve Outcome: Not Progressing   Problem: Self-Concept: Goal: Will verbalize positive feelings about self Outcome: Not Progressing  DAR NOTE: Patient presents with flat affect and depressed mood.  Denies suicidal thoughts, pain, auditory and visual hallucinations.  Rates depression at 0, hopelessness at 0, and anxiety at 0.  Maintained on routine safety checks.  Medications given as prescribed.  Support and encouragement offered as needed.  Patient is withdrawn and isolates to his room majority of this shift.  Minimal interaction with staff.  Visible briefly in milieu to watch TV.  Patient is safe on the unit.

## 2018-01-08 NOTE — [facility] Group Notes (Signed)
LCSW Group Therapy Note  01/08/2018 1:15pm  Type of Therapy/Topic:  Group Therapy:  Feelings about Diagnosis  Participation Level:  Did Not Attend   Description of Group:   This group will allow patients to explore their thoughts and feelings about diagnoses they have received. Patients will be guided to explore their level of understanding and acceptance of these diagnoses. Facilitator will encourage patients to process their thoughts and feelings about the reactions of others to their diagnosis and will guide patients in identifying ways to discuss their diagnosis with significant others in their lives. This group will be process-oriented, with patients participating in exploration of their own experiences, giving and receiving support, and processing challenge from other group members.   Therapeutic Goals: 1. Patient will demonstrate understanding of diagnosis as evidenced by identifying two or more symptoms of the disorder 2. Patient will be able to express two feelings regarding the diagnosis 3. Patient will demonstrate their ability to communicate their needs through discussion and/or role play  Summary of Patient Progress:       Therapeutic Modalities:   Cognitive Behavioral Therapy Brief Therapy Feelings Identification    Ida Rogue, LCSW 01/08/2018 1:51 PM

## 2018-01-08 NOTE — [facility] Suicide Risk Assessment (Signed)
[name] Admission Suicide Risk Assessment   Nursing information obtained from:  Patient Demographic factors:  Male Current Mental Status:  NA Loss Factors:  NA Historical Factors:  NA Risk Reduction Factors:  NA  Total Time spent with patient: 1 hour Principal Problem: Paranoid schizophrenia (HCC) Diagnosis:   Patient Active Problem List   Diagnosis Date Noted  . Acute episode of schizophrenia with history of multiple episodes (HCC) [F23] 06/30/2017  . Schizophrenia, paranoid type (HCC) [F20.0] 06/08/2017  . 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 Paranoid schizophrenia (HCC) [F20.0] 09/29/2015  . 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 History of posttraumatic stress disorder (PTSD) [Z86.59] 09/26/2015   Subjective Data: see H&P for details  Continued Clinical Symptoms:  Alcohol Use Disorder Identification Test Final Score (AUDIT): 0 The "Alcohol Use Disorders Identification Test", Guidelines for Use in Primary Care, Second Edition.  World Science writer [facility]). Score between 0-7:  no or low risk or alcohol related problems. Score between 8-15:  moderate risk of alcohol related problems. Score between 16-19:  high risk of alcohol related problems. Score 20 or above:  warrants further diagnostic evaluation for alcohol dependence and treatment.   CLINICAL FACTORS:   Schizophrenia:   Paranoid or undifferentiated type    Psychiatric Specialty Exam: Physical Exam  Nursing note and vitals reviewed.   ROS-See H&P for details  Blood pressure 108/78, pulse 88, temperature 98.4 F (36.9 C), temperature source Oral, resp. rate 16, height 6' (1.829 m), weight 59.4 kg (131 lb), SpO2 98 %.Body mass index is 17.77 kg/m.    COGNITIVE FEATURES THAT CONTRIBUTE TO RISK:  None    SUICIDE RISK:   Minimal: No identifiable suicidal ideation.  Patients presenting with no risk factors but with morbid  ruminations; may be classified as minimal risk based on the severity of the depressive symptoms  PLAN OF CARE: See H&P for details  I certify that inpatient services furnished can reasonably be expected to improve the patient's condition.   Micheal Likens, MD 01/08/2018, 12:55 PM
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
## 2018-01-08 NOTE — Progress Notes (Signed)
Pt had just woke up from a nap. Pt was observed walking to dayroom, requesting a snack. Pt denies SI/HI/AVH/Pain at this time. Pt appears flat/suspicious in affect and mood. Pt appears to be isolative.Will continue with POC.

## 2018-01-08 NOTE — Progress Notes (Signed)
Adult Psychoeducational Group Note  Date:  01/08/2018 Time:  9:04 PM  Group Topic/Focus:  Wrap-Up Group:   The focus of this group is to help patients review their daily goal of treatment and discuss progress on daily workbooks.  Participation Level:  Active  Participation Quality:  Appropriate  Affect:  Appropriate  Cognitive:  Appropriate  Insight: Appropriate  Engagement in Group:  Engaged  Modes of Intervention:  Discussion  Additional Comments: The patient expressed that he attended group.The patient also said that he rates today a 8.  Octavio Manns 01/08/2018, 9:04 PM

## 2018-01-08 NOTE — [facility] Suicide Risk Assessment (Signed)
[name] INPATIENT:  Family/Significant Other Suicide Prevention Education  Suicide Prevention Education:  Patient Refusal for Family/Significant Other Suicide Prevention Education: The patient Collin White has refused to provide written consent for family/significant other to be provided Family/Significant Other Suicide Prevention Education during admission and/or prior to discharge.  Physician notified.  Ida Rogue 01/08/2018, 4:05 PM

## 2018-01-09 DIAGNOSIS — R451 Restlessness and agitation: Secondary | ICD-10-CM

## 2018-01-09 DIAGNOSIS — Z87891 Personal history of nicotine dependence: Secondary | ICD-10-CM

## 2018-01-09 DIAGNOSIS — G4700 Insomnia, unspecified: Secondary | ICD-10-CM

## 2018-01-09 DIAGNOSIS — F419 Anxiety disorder, unspecified: Secondary | ICD-10-CM
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
## 2018-01-09 NOTE — Progress Notes (Signed)
Patient ID: Collin White, male   DOB: 10/27/1995, 21 y.o.   MRN: [id number]    D: Pt has been very flat and depressed on the unit today. Pt did not interact with peers or staff. Pt was very isolative and withdrawn. Pt wrote on his patient self inventory sheet that his depression was a 0, his hopelessness was a 0, and his anxiety was a 0. Pt documented that his goal for today was nothing. Pt took all medications as prescribed by the doctor.  Pt reported being negative SI/HI, no AH/VH noted. A: 15 min checks continued for patient safety. R: Pt safety maintained.     

## 2018-01-09 NOTE — [facility] Group Notes (Signed)
[name] Group Notes: (Clinical Social Work)   01/09/2018      Type of Therapy:  Group Therapy   Participation Level:  Did Not Attend - CSW was asked not to awaken for group   Tierney Behl Grossman-Orr, LCSW 01/09/2018, 12:17 PM 
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
## 2018-01-10 NOTE — Progress Notes (Signed)
Patient ID: Collin White, male   DOB: 09-Jan-1996, 22 y.o.   MRN: [id number]    D: Pt has been very flat and depressed on the unit today. Pt did not interact with peers or staff. Pt was very isolative and withdrawn. Pt wrote on his patient self inventory sheet that his depression was a 0, his hopelessness was a 0, and his anxiety was a 0. Pt documented that his goal for today was nothing. Pt took all medications as prescribed by the doctor.  Pt reported being negative SI/HI, no AH/VH noted. A: 15 min checks continued for patient safety. R: Pt safety maintained.

## 2018-01-10 NOTE — [facility] Group Notes (Signed)
[name] Group Notes:  (Nursing/MHT/Case Management/Adjunct)  Date:  01/10/2018  Time:  11:26 AM  Type of Therapy:  Psychoeducational Skills  Participation Level:  Did Not Attend  Participation Quality:  Did not attend  Affect:  Did not attend  Cognitive:  Did not attend  Insight:  None  Engagement in Group:  Did not attend  Modes of Intervention:  Did not attend  Summary of Progress/Problems: Pt did not attend Psychoeducational group with topic healthy support systems.  [name] 01/10/2018, 11:26 AM

## 2018-01-10 NOTE — Progress Notes (Addendum)
Pt is observed sleeping in bed for majority of shift. Pt denies SI/HI/AVH/Pain at this time. Pt appears flat/paranoid in affect and mood. Pt remains isolative to room provided with encouragement. Pt attends all meals. Pt is guarded/minimal with interaction. Pt proceeded to go back to sleep.Will continue with POC.

## 2018-01-10 NOTE — [facility] Group Notes (Signed)
[name] Group Notes:  (Nursing/MHT/Case Management/Adjunct)  Date:  01/10/2018  Time:  11:26 AM  Type of Therapy:  Orientation/Goals group  Participation Level:  Did Not Attend  Participation Quality:  Did Not Attend  Affect:  Did Not Attend  Cognitive:  Did Not Attend  Insight:  None  Engagement in Group:  Did Not Attend  Modes of Intervention:  Did Not Attend  Summary of Progress/Problems: Pt did not attend patient self inventory group/orientation group.  [name] 01/10/2018, 11:26 AM

## 2018-01-10 NOTE — [facility] Group Notes (Signed)
[facility] LCSW Group Therapy Note  Date/Time:  01/10/2018  11:00AM-12:00PM  Type of Therapy and Topic:  Group Therapy:  Music and Mood  Participation Level:  Active   Description of Group: In this process group, members listened to a variety of genres of music and identified that different types of music evoke different responses.  Patients were encouraged to identify music that was soothing for them and music that was energizing for them.  Patients discussed how this knowledge can help with wellness and recovery in various ways including managing depression and anxiety as well as encouraging healthy sleep habits.    Therapeutic Goals: 1. Patients will explore the impact of different varieties of music on mood 2. Patients will verbalize the thoughts they have when listening to different types of music 3. Patients will identify music that is soothing to them as well as music that is energizing to them 4. Patients will discuss how to use this knowledge to assist in maintaining wellness and recovery 5. Patients will explore the use of music as a coping skill  Summary of Patient Progress:  At the beginning of group, patient was not present and did not arrive until about 15 minutes before group ended.  The song he requested had to be turned off because of the romantic content, and he was not upset about this.  He stated that when he arrived in group he felt "good" and when group was over he felt "simple."  His speech was similarly disorganiazed at different times.  Therapeutic Modalities: Solution Focused Brief Therapy Activity   Ambrose Mantle, LCSW

## 2018-01-10 NOTE — Progress Notes (Signed)
Pt had just woke up from a nap. Pt was observed walking to dayroom, requesting a snack. Pt denies SI/HI/AVH/Pain at this time. Pt appears flat/paranoid in affect and mood. Pt remains isolative to room provided with encouragement. Pt attends all meals. Pt is guarded/minimal with interaction.Will continue with POC.

## 2018-01-11 DIAGNOSIS — F640 Transsexualism: Secondary | ICD-10-CM

## 2018-01-11 MED ORDER — PALIPERIDONE PALMITATE ER 156 MG/ML IM SUSY
156.0000 mg | PREFILLED_SYRINGE | INTRAMUSCULAR | Status: DC
  Administered 2018-01-12: 156 mg via INTRAMUSCULAR
  Filled 2018-01-11 (×2): qty 1

## 2018-01-11 NOTE — Plan of Care (Signed)
D: Pt denies SI/HI/AVH. Pt is pleasant and cooperative. Pt isolated to room this evening, pt forwarded little information at this time.   A: Pt was offered support and encouragement. Pt was encourage to attend groups. Q 15 minute checks were done for safety.   R: Pt attends groups and interacts well with peers and staff. Pt is taking medication. Pt receptive to treatment and safety maintained on unit.   Problem: Coping: Goal: Ability to identify and develop effective coping behavior will improve Outcome: Not Progressing   Problem: Coping: Goal: Coping ability will improve Outcome: Not Progressing   Problem: Coping: Goal: Will verbalize feelings Outcome: Not Progressing   Problem: Safety: Goal: Ability to redirect hostility and anger into socially appropriate behaviors will improve Outcome: Progressing   Problem: Safety: Goal: Ability to remain free from injury will improve Outcome: Progressing
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
## 2018-01-11 NOTE — Plan of Care (Signed)
  Problem: Health Behavior/Discharge Planning: Goal: Compliance with prescribed medication regimen will improve Outcome: Progressing   Problem: Safety: Goal: Ability to remain free from injury will improve Outcome: Progressing  DAR NOTE: Patient presents with flat affect and depressed mood.  Denies suicidal thoughts, pain, auditory and visual hallucinations.  Rates depression at 0, hopelessness at 0, and anxiety at 0.  Maintained on routine safety checks.  Medications given as prescribed.  Support and encouragement offered as needed.  Attended group and participated.  States goal for today is "nothing."  Patient is withdrawn and isolates to his room.  No behavioral issues noted or observed.  Offered no complaint.
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
## 2018-01-12 NOTE — [facility] Group Notes (Signed)
LCSW Group Therapy Note   01/12/2018 1:15pm   Type of Therapy and Topic:  Group Therapy:  Overcoming Obstacles   Participation Level:  Minimal   Description of Group:    In this group patients will be encouraged to explore what they see as obstacles to their own wellness and recovery. They will be guided to discuss their thoughts, feelings, and behaviors related to these obstacles. The group will process together ways to cope with barriers, with attention given to specific choices patients can make. Each patient will be challenged to identify changes they are motivated to make in order to overcome their obstacles. This group will be process-oriented, with patients participating in exploration of their own experiences as well as giving and receiving support and challenge from other group members.   Therapeutic Goals: 1. Patient will identify personal and current obstacles as they relate to admission. 2. Patient will identify barriers that currently interfere with their wellness or overcoming obstacles.  3. Patient will identify feelings, thought process and behaviors related to these barriers. 4. Patient will identify two changes they are willing to make to overcome these obstacles:      Summary of Patient Progress   Stayed the entire time.  Spent at least half of the time humming and talking under her breath, but was not loud enough to be disruptive, and no one else seemed to be concerned.  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

## 2018-01-12 NOTE — Plan of Care (Signed)
D: Pt denies SI/HI/AVH. Pt is pleasant and cooperative. Pt isolates to the room. Pt continues to endorse no AVH, but pt appears to be responding to internal stimuli at times.   A: Pt was offered support and encouragement. Pt was given scheduled medications. Pt was encourage to attend groups. Q 15 minute checks were done for safety.   R: safety maintained on unit.   Problem: Education: Goal: Emotional status will improve Outcome: Progressing   Problem: Education: Goal: Mental status will improve Outcome: Progressing   Problem: Coping: Goal: Ability to identify and develop effective coping behavior will improve Outcome: Not Progressing   Problem: Activity: Goal: Will verbalize the importance of balancing activity with adequate rest periods Outcome: Not Progressing   Problem: Education: Goal: Will be free of psychotic symptoms Outcome: Not Progressing

## 2018-01-12 NOTE — Progress Notes (Signed)
Did not attend group 

## 2018-01-12 NOTE — Plan of Care (Signed)
  Problem: Education: Goal: Will be free of psychotic symptoms Outcome: Progressing   Problem: Health Behavior/Discharge Planning: Goal: Compliance with prescribed medication regimen will improve Outcome: Progressing   Problem: Nutritional: Goal: Ability to achieve adequate nutritional intake will improve Outcome: Progressing   Problem: Safety: Goal: Ability to remain free from injury will improve Outcome: Progressing  DAR NOTE: Patient presents with flat affect and depressed mood.  Denies suicidal thoughts, pain, auditory and visual hallucinations.  Described energy level as normal and concentration as good.  Rates depression at 0, hopelessness at 0, and anxiety at 0.  Maintained on routine safety checks.  Medications given as prescribed.  Support and encouragement offered as needed.  Attended group and participated.  Patient is withdrawn and isolates to his room.  No interaction with staff or peers.  Offered no complaint.

## 2018-01-12 NOTE — Progress Notes (Signed)
Recreation Therapy Notes  Date: 5.28.19 Time: 1000 Location: 500 Hall Dayroom  Group Topic: Coping Skills  Goal Area(s) Addresses:  Patients will be able to identify positive coping skills. Patients will be able to identify benefits of coping skills. Patients will be able to identify benefits of using coping skills post d/c.  Intervention: Worksheet   Activity: Orthoptist.  Patients were to identify the things they feel have them stuck.  Patients were to write those things inside of the spider webb.  Patients would then identify at least 3 coping skills for each situation identified and write it on the outside of the webb.  Education: Pharmacologist, Building control surveyor.   Education Outcome: Acknowledges understanding/In group clarification offered/Needs additional education.   Clinical Observations/Feedback: Pt did not attend group.    [name], LRT/CTRS         [name] A 01/12/2018 12:06 PM

## 2018-01-13 NOTE — Plan of Care (Signed)
D: Pt denies SI/HI/AVH. Pt is pleasant and cooperative. Pt continues to isolate to room. Pt appeared in better spirits that he was given bath earlier today. Pt continues to isolate and appears to be responding.   A: Pt was offered support and encouragement. Pt was given scheduled medications. Pt was encourage to attend groups. Q 15 minute checks were done for safety.   R: safety maintained on unit.   Problem: Education: Goal: Emotional status will improve Outcome: Progressing   Problem: Education: Goal: Mental status will improve Outcome: Progressing   Problem: Coping: Goal: Ability to identify and develop effective coping behavior will improve Outcome: Not Progressing   Problem: Activity: Goal: Will verbalize the importance of balancing activity with adequate rest periods Outcome: Progressing   Problem: Education: Goal: Will be free of psychotic symptoms Outcome: Not Progressing   Problem: Education: Goal: Knowledge of the prescribed therapeutic regimen will improve Outcome: Progressing   Problem: Coping: Goal: Coping ability will improve Outcome: Progressing   Problem: Coping: Goal: Will verbalize feelings Outcome: Progressing

## 2018-01-13 NOTE — B[facility] Group Notes (Signed)
LCSW Group Therapy Note   01/13/2018 1:15pm   Type of Therapy and Topic:  Group Therapy:  Positive Affirmations   Participation Level:  Minimal  Description of Group: This group addressed positive affirmation toward self and others. Patients went around the room and identified two positive things about themselves and two positive things about a peer in the room. Patients reflected on how it felt to share something positive with others, to identify positive things about themselves, and to hear positive things from others. Patients were encouraged to have a daily reflection of positive characteristics or circumstances.  Therapeutic Goals 1. Patient will verbalize two of their positive qualities 2. Patient will demonstrate empathy for others by stating two positive qualities about a peer in the group 3. Patient will verbalize their feelings when voicing positive self affirmations and when voicing positive affirmations of others 4. Patients will discuss the potential positive impact on their wellness/recovery of focusing on positive traits of self and others. Summary of Patient Progress:  Stayed the entire time.  No humming or talking under her breath.  When asked about affirmations, replied in a very quiet voice that was difficult to understand.  Was saying something about the family going through difficult times, and how that has influenced her. The thought did not hold together well.  Therapeutic Modalities Cognitive Behavioral Therapy Motivational Interviewing  Collin White, [place] 01/13/2018 3:29 PM
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
## 2018-01-13 NOTE — Progress Notes (Signed)
Recreation Therapy Notes  Date: 5.29.19 Time: 1000 Location: 500 Hall Dayroom   Group Topic: Communication, Team Building, Problem Solving  Goal Area(s) Addresses:  Patient will effectively work with peer towards shared goal.  Patient will identify skills used to make activity successful.  Patient will identify how skills used during activity can be used to reach post d/c goals.   Intervention: STEM Activity  Activity: Landing Pad. In teams patients were given 12 plastic drinking straws and White length of masking tape. Using the materials provided patients were asked to build White landing pad to catch White golf ball dropped from approximately 6 feet in the air.   Education:Social Skills, Discharge Planning   Education Outcome: Acknowledges education/In group clarification offered/Needs additional education.   Clinical Observations/Feedback: Pt did not attend group.   Collin White, LRT/CTRS         Collin White 01/13/2018 12:43 PM 

## 2018-01-13 NOTE — Progress Notes (Signed)
Adult Psychoeducational Group Note  Date:  01/13/2018 Time:  8:51 AM  Group Topic/Focus:  Goals Group:   The focus of this group is to help patients establish daily goals to achieve during treatment and discuss how the patient can incorporate goal setting into their daily lives to aide in recovery.  Participation Level:  Minimal  Participation Quality:  Appropriate  Affect:  Appropriate  Cognitive:  Lacking  Insight: Appropriate and Limited  Engagement in Group:  Limited  Modes of Intervention:  Orientation  Additional Comments:  When asked about goals the pt said there were no goals set so pt couldn't complete them.   Deforest Hoyles Naydeen Speirs 01/13/2018, 8:51 AM

## 2018-01-13 NOTE — Tx Team (Signed)
Interdisciplinary Treatment and Diagnostic Plan Update  01/13/2018 Time of Session: 10:28 AM  Collin White MRN: [id number]  Principal Diagnosis: Paranoid schizophrenia [facility])  Secondary Diagnoses: Principal Problem:   Paranoid schizophrenia ([place])   Current Medications:  Current Facility-Administered Medications  Medication Dose Route Frequency Provider Last Rate Last Dose  . acetaminophen (TYLENOL) tablet 650 mg  650 mg Oral Q6H PRN Patrecia Pour, NP      . alum & mag hydroxide-simeth (MAALOX/MYLANTA) 200-200-20 MG/5ML suspension 30 mL  30 mL Oral Q4H PRN Patrecia Pour, NP      . benztropine (COGENTIN) tablet 1 mg  1 mg Oral BID PRN Pennelope Bracken, MD       Or  . benztropine mesylate (COGENTIN) injection 1 mg  1 mg Intramuscular BID PRN Pennelope Bracken, MD      . citalopram (CELEXA) tablet 20 mg  20 mg Oral Daily Patrecia Pour, NP   20 mg at 01/13/18 0856  . haloperidol (HALDOL) tablet 5 mg  5 mg Oral Q6H PRN Pennelope Bracken, MD       Or  . haloperidol lactate (HALDOL) injection 5 mg  5 mg Intramuscular Q6H PRN Pennelope Bracken, MD      . hydrOXYzine (ATARAX/VISTARIL) tablet 50 mg  50 mg Oral Q6H PRN Pennelope Bracken, MD   50 mg at 01/12/18 2345  . magnesium hydroxide (MILK OF MAGNESIA) suspension 30 mL  30 mL Oral Daily PRN Patrecia Pour, NP      . paliperidone (INVEGA SUSTENNA) injection 156 mg  156 mg Intramuscular Q28 days Pennelope Bracken, MD   156 mg at 01/12/18 1458  . paliperidone (INVEGA) 24 hr tablet 9 mg  9 mg Oral Daily Patrecia Pour, NP   9 mg at 01/13/18 0856  . traZODone (DESYREL) tablet 100 mg  100 mg Oral QHS Patrecia Pour, NP   100 mg at 01/12/18 2345    PTA Medications: Medications Prior to Admission  Medication Sig Dispense Refill Last Dose  . citalopram (CELEXA) 20 MG tablet Take 1 tablet (20 mg total) by mouth daily. For depression (Patient not taking: Reported on 01/02/2018) 30 tablet 0 Not Taking  at Unknown time  . fluticasone (FLONASE) 50 MCG/ACT nasal spray Place 2 sprays into both nostrils daily. (Patient not taking: Reported on 01/02/2018) 9.9 g 2 Not Taking at Unknown time  . fluticasone (FLONASE) 50 MCG/ACT nasal spray Place 2 sprays into both nostrils daily. (Patient not taking: Reported on 01/02/2018) 9.9 g 2 Not Taking at Unknown time  . hydrOXYzine (ATARAX/VISTARIL) 25 MG tablet Take 1 tablet (25 mg total) by mouth every 6 (six) hours as needed for anxiety. (Patient not taking: Reported on 01/02/2018) 60 tablet 0 Not Taking at Unknown time  . ibuprofen (ADVIL,MOTRIN) 200 MG tablet Take 2 tablets (400 mg total) by mouth every 6 (six) hours as needed for moderate pain. (Patient not taking: Reported on 01/02/2018) 30 tablet 0 Not Taking at Unknown time  . OLANZapine (ZYPREXA) 15 MG tablet Take 2 tablets (30 mg total) by mouth at bedtime. For mood control (Patient not taking: Reported on 01/02/2018) 60 tablet 0 Not Taking at Unknown time  . potassium chloride (K-DUR) 10 MEQ tablet Take 1 tablet (10 mEq total) by mouth daily. (Patient not taking: Reported on 01/02/2018) 7 tablet 0 Not Taking at Unknown time  . promethazine (PHENERGAN) 25 MG tablet Take 1 tablet (25 mg total) by mouth every 6 (six)  hours as needed for nausea or vomiting. (Patient not taking: Reported on 01/02/2018) 12 tablet 0 Not Taking at Unknown time  . traZODone (DESYREL) 100 MG tablet Take 1 tablet 100 mg by mouth at bedtime: For sleep (Patient not taking: Reported on 08/11/2017) 30 tablet 0 Not Taking at Unknown time    Patient Stressors: Financial difficulties Health problems Legal issue Medication change or noncompliance  Patient Strengths: Communication skills  Treatment Modalities: Medication Management, Group therapy, Case management,  1 to 1 session with clinician, Psychoeducation, Recreational therapy.   Physician Treatment Plan for Primary Diagnosis: Paranoid schizophrenia ([place]) Long Term Goal(s):  Improvement in symptoms so as ready for discharge  Short Term Goals: Ability to identify and develop effective coping behaviors will improve Compliance with prescribed medications will improve  Medication Management: Evaluate patient's response, side effects, and tolerance of medication regimen.  Therapeutic Interventions: 1 to 1 sessions, Unit Group sessions and Medication administration.  Evaluation of Outcomes: Progressing   5/29: She remains delusional with thoughts that she own multiple houses and has multiple children, but the delusions are not intrusive or bothersome. She is tolerating her current medications well, and she is in agreement to continue her current regimen without changes.  - Paranoid Schizophrenia:  -Continue [place]. Lorayne Bender Sustenna '234mg'$  IM once given on 5/24, and then start Invega Sustenna 156 mg IMQ28 days starting on 01/12/18.  -Continue Invega 9 mg po qDay.  -Continue celexa '20mg'$  po q Day   -Agitation Protocol  -Continue haldol '5mg'$  po/IM q6h prn agitation/haldol.   -Anxiety:  -Continue vistaril '50mg'$  po q6h prn anxiety.   -Insomnia:  -Continue trazodone '100mg'$  po qhs     Physician Treatment Plan for Secondary Diagnosis: Principal Problem:   Paranoid schizophrenia ([place])   Long Term Goal(s): Improvement in symptoms so as ready for discharge  Short Term Goals: Ability to identify and develop effective coping behaviors will improve Compliance with prescribed medications will improve  Medication Management: Evaluate patient's response, side effects, and tolerance of medication regimen.  Therapeutic Interventions: 1 to 1 sessions, Unit Group sessions and Medication administration.  Evaluation of Outcomes: Progressing   RN Treatment Plan for Primary Diagnosis: Paranoid schizophrenia ([place]) Long Term Goal(s): Knowledge of disease and therapeutic regimen to maintain health  will improve  Short Term Goals: Ability to identify and develop effective coping behaviors will improve and Compliance with prescribed medications will improve  Medication Management: RN will administer medications as ordered by provider, will assess and evaluate patient's response and provide education to patient for prescribed medication. RN will report any adverse and/or side effects to prescribing provider.  Therapeutic Interventions: 1 on 1 counseling sessions, Psychoeducation, Medication administration, Evaluate responses to treatment, Monitor vital signs and CBGs as ordered, Perform/monitor CIWA, COWS, AIMS and Fall Risk screenings as ordered, Perform wound care treatments as ordered.  Evaluation of Outcomes: Progressing   LCSW Treatment Plan for Primary Diagnosis: Paranoid schizophrenia ([place]) Long Term Goal(s): Safe transition to appropriate next level of care at discharge, Engage patient in therapeutic group addressing interpersonal concerns.  Short Term Goals: Engage patient in aftercare planning with referrals and resources  Therapeutic Interventions: Assess for all discharge needs, 1 to 1 time with Social worker, Explore available resources and support systems, Assess for adequacy in community support network, Educate family and significant other(s) on suicide prevention, Complete Psychosocial Assessment, Interpersonal group therapy.  Evaluation of Outcomes: Met  Return home, follow up [name]   Progress in Treatment: Attending groups: No Participating in groups:  No Taking medication as prescribed: Yes Toleration medication: Yes, no side effects reported at this time Family/Significant other contact made: No Patient understands diagnosis: No Limited insight Discussing patient identified problems/goals with staff: Yes Medical problems stabilized or resolved: Yes Denies suicidal/homicidal ideation: Yes Issues/concerns per patient self-inventory: None Other: N/A  New  problem(s) identified: None identified at this time.   New Short Term/Long Term Goal(s): "I just want to leave here and be alright."   Discharge Plan or Barriers:   Reason for Continuation of Hospitalization:  Delusions  Reality testing Medication stabilization   Estimated Length of Stay: 6/3  Attendees: Patient:  01/13/2018  10:28 AM  Physician: Maris Berger, MD 01/13/2018  10:28 AM  Nursing: Phillis Haggis, RN 01/13/2018  10:28 AM  RN Care Manager: Lars Pinks, RN 01/13/2018  10:28 AM  Social Worker: Ripley Fraise 01/13/2018  10:28 AM  Recreational Therapist: Winfield Cunas 01/13/2018  10:28 AM  Other: Norberto Sorenson 01/13/2018  10:28 AM  Other:  01/13/2018  10:28 AM    Scribe for Treatment Team:  Roque Lias LCSW 01/13/2018 10:28 AM

## 2018-01-13 NOTE — Plan of Care (Signed)
  Problem: Coping: Goal: Ability to identify and develop effective coping behavior will improve Outcome: Progressing   Problem: Nutritional: Goal: Ability to achieve adequate nutritional intake will improve Outcome: Progressing   Problem: Self-Concept: Goal: Will verbalize positive feelings about self Outcome: Progressing D: Pt visible inhall on initial contact. Denies SI, HI, AVH and pain. Presents with flat affect and depressed mood. Interacts appropriately with staff and peers. Pt assisted with ADLs (bath) this shift. Room cleaned and linen changed as well.  A: Introduced self to pt. Emotional support and availability provided to pt. All medications administered with verbal education and effects monitored. Safety checks maintained at Q 15 minutes intervals without outburst or self harm gestures.   R: Pt receptive to care. Compliant with medications when offered. Denies adverse drug reactions when assessed this shift. Tolerates all PO intake well. Remains safe on and off unit.

## 2018-01-14 MED ORDER — CITALOPRAM HYDROBROMIDE 20 MG PO TABS: 20.0000 mg | ORAL_TABLET | Freq: Every day | ORAL | 0 refills | Status: DC

## 2018-01-14 MED ORDER — TRAZODONE HCL 100 MG PO TABS: 100.0000 mg | ORAL_TABLET | Freq: Every day | ORAL | 0 refills | Status: DC

## 2018-01-14 MED ORDER — PALIPERIDONE ER 3 MG PO TB24
9.0000 mg | ORAL_TABLET | Freq: Every day | ORAL | Status: DC
  Filled 2018-01-14: qty 21

## 2018-01-14 MED ORDER — PALIPERIDONE ER 9 MG PO TB24: 9.0000 mg | ORAL_TABLET | Freq: Every day | ORAL | 0 refills | Status: DC

## 2018-01-14 MED ORDER — PALIPERIDONE PALMITATE ER 156 MG/ML IM SUSY: 156.0000 mg | PREFILLED_SYRINGE | INTRAMUSCULAR | 0 refills | Status: DC

## 2018-01-14 MED ORDER — FLUTICASONE PROPIONATE 50 MCG/ACT NA SUSP: 2.0000 | Freq: Every day | NASAL | 0 refills | Status: DC

## 2018-01-14 MED ORDER — HYDROXYZINE HCL 50 MG PO TABS: 50.0000 mg | ORAL_TABLET | Freq: Four times a day (QID) | ORAL | 0 refills | Status: DC | PRN
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
## 2018-01-14 NOTE — Progress Notes (Signed)
  [facility] Adult Case Management Discharge Plan :  Will you be returning to the same living situation after discharge:  No At discharge, do you have transportation home?: Yes,  bus pass Do you have the ability to pay for your medications: Yes,  mental health  Release of information consent forms completed and in the chart;  Patient's signature needed at discharge.  Patient to Follow up at: Follow-up Information    [name] Follow up on 01/18/2018.   Specialty:  Behavioral Health Why:  Monday at [facility] for your hospital follow up appointment.  Please bring ID and hospital d/c paperwork Contact information: [address] [place] [address] [phone]           Next level of care provider has access to [facility] Link:no  Safety Planning and Suicide Prevention discussed: Yes,  yes  Have you used any form of tobacco in the last 30 days? (Cigarettes, Smokeless Tobacco, Cigars, and/or Pipes): No  Has patient been referred to the Quitline?: N/A patient is not a smoker  Patient has been referred for addiction treatment: N/A  Ida Rogue, LCSW 01/14/2018, 9:09 AM

## 2018-01-14 NOTE — Progress Notes (Signed)
Patient verbalizes readiness for discharge. Follow up plan explained, AVS, Transition record and SRA given. Prescriptions and teaching provided. Belongings returned and signed for. Suicide safety plan completed and signed. Patient verbalizes understanding. Patient denies SI/HI and assures this Clinical research associate he will seek assistance should that change. Patient discharged to lobby with bus pass and directions.

## 2018-01-14 NOTE — Progress Notes (Signed)
D: pt found in bed. Pt pleasant/compliant with med administration. Pt stated she slept well last night. Pt rates her depression/hopelessness/anxiety all 0/10. Pt denies any physical pain. Pt stated "nothing" for her goal for today and to achieve this goal, "nothing".  A: support and encouragement given. Pt given medication per protocol and standing orders. q71m checks implemented and continued. R: pt safe on the unit. Will continue to monitor.
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
## 2018-01-14 NOTE — Plan of Care (Signed)
Patient verbalizes readiness for discharge. Follow up plan explained, AVS, Transition record and SRA given. Prescriptions and teaching provided. Belongings returned and signed for. Suicide safety plan completed and signed. Patient verbalizes understanding. Patient denies SI/HI and assures this Clinical research associate he will seek assistance should that change. Patient discharged to lobby with bus pass.  Problem: Education: Goal: Knowledge of [facility] Education information/materials will improve Outcome: Adequate for Discharge Goal: Emotional status will improve Outcome: Adequate for Discharge Goal: Mental status will improve Outcome: Adequate for Discharge Goal: Verbalization of understanding the information provided will improve Outcome: Adequate for Discharge   Problem: Coping: Goal: Ability to identify and develop effective coping behavior will improve Outcome: Adequate for Discharge   Problem: Self-Concept: Goal: Will verbalize positive feelings about self Outcome: Adequate for Discharge   Problem: Activity: Goal: Will verbalize the importance of balancing activity with adequate rest periods Outcome: Adequate for Discharge   Problem: Education: Goal: Will be free of psychotic symptoms Outcome: Adequate for Discharge Goal: Knowledge of the prescribed therapeutic regimen will improve Outcome: Adequate for Discharge   Problem: Coping: Goal: Coping ability will improve Outcome: Adequate for Discharge Goal: Will verbalize feelings Outcome: Adequate for Discharge   Problem: Health Behavior/Discharge Planning: Goal: Compliance with prescribed medication regimen will improve Outcome: Adequate for Discharge   Problem: Nutritional: Goal: Ability to achieve adequate nutritional intake will improve Outcome: Adequate for Discharge   Problem: Role Relationship: Goal: Ability to communicate needs accurately will improve Outcome: Adequate for Discharge Goal: Ability to interact with  others will improve Outcome: Adequate for Discharge   Problem: Safety: Goal: Ability to redirect hostility and anger into socially appropriate behaviors will improve Outcome: Adequate for Discharge Goal: Ability to remain free from injury will improve Outcome: Adequate for Discharge   Problem: Self-Care: Goal: Ability to participate in self-care as condition permits will improve Outcome: Adequate for Discharge   Problem: Self-Concept: Goal: Will verbalize positive feelings about self Outcome: Adequate for Discharge

## 2018-01-14 NOTE — Progress Notes (Signed)
Recreation Therapy Notes  Date: 5.30.19 Time: 1000 Location: 500 Hall Dayroom    Group Topic: Communication  Goal Area(s) Addresses:  Patient will effectively communicate with peers in group.  Patient will verbalize benefit of healthy communication. Patient will verbalize positive effect of healthy communication on post d/c goals.  Patient will identify communication techniques that made activity effective for group.   Intervention: Geometrical Drawings  Activity: Back to Back Drawings.  Patients were paired into groups of two.  Each group was given White picture of geometrical shapes.  One person from each gave the instructions on how to draw the picture, while the other person listened so they could draw it.  Once finished the pair would compare the drawing with the original to see how close they came.  Patients would then receive and different picture and switch roles.  Education: Communication, Discharge Planning  Education Outcome: Acknowledges understanding/In group clarification offered/Needs additional education.   Clinical Observations/Feedback: Pt did not attend group.    Torsha Lemus, LRT/CTRS         Collin White 01/14/2018 11:41 AM 

## 2018-01-18 ENCOUNTER — Emergency Department (HOSPITAL_COMMUNITY): Payer: Self-pay

## 2018-01-18 ENCOUNTER — Encounter (HOSPITAL_COMMUNITY): Payer: Self-pay | Admitting: Emergency Medicine

## 2018-01-18 ENCOUNTER — Emergency Department (HOSPITAL_COMMUNITY)
Admission: EM | Admit: 2018-01-18 | Discharge: 2018-01-18 | Disposition: A | Discharge status: Home / Self Care | Payer: Self-pay | Attending: Emergency Medicine | Admitting: Emergency Medicine

## 2018-01-18 ENCOUNTER — Other Ambulatory Visit: Payer: Self-pay

## 2018-01-18 DIAGNOSIS — W2209XA Striking against other stationary object, initial encounter: Secondary | ICD-10-CM | POA: Insufficient documentation

## 2018-01-18 DIAGNOSIS — Y999 Unspecified external cause status: Secondary | ICD-10-CM | POA: Insufficient documentation

## 2018-01-18 DIAGNOSIS — R51 Headache: Secondary | ICD-10-CM

## 2018-01-18 DIAGNOSIS — S0990XA Unspecified injury of head, initial encounter: Secondary | ICD-10-CM

## 2018-01-18 DIAGNOSIS — Y939 Activity, unspecified: Secondary | ICD-10-CM | POA: Insufficient documentation

## 2018-01-18 DIAGNOSIS — Z79899 Other long term (current) drug therapy: Secondary | ICD-10-CM | POA: Insufficient documentation

## 2018-01-18 DIAGNOSIS — Z87891 Personal history of nicotine dependence: Secondary | ICD-10-CM | POA: Insufficient documentation

## 2018-01-18 DIAGNOSIS — R519 Headache, unspecified: Secondary | ICD-10-CM

## 2018-01-18 DIAGNOSIS — Y929 Unspecified place or not applicable: Secondary | ICD-10-CM | POA: Insufficient documentation

## 2018-01-18 LAB — RAPID URINE DRUG SCREEN, HOSP PERFORMED
Amphetamines: NOT DETECTED
Barbiturates: NOT DETECTED
Benzodiazepines: NOT DETECTED
Cocaine: NOT DETECTED
Opiates: NOT DETECTED
Tetrahydrocannabinol: NOT DETECTED

## 2018-01-18 MED ORDER — ACETAMINOPHEN 500 MG PO TABS: 500.0000 mg | ORAL_TABLET | Freq: Four times a day (QID) | ORAL | 0 refills | Status: DC | PRN

## 2018-01-18 MED ORDER — ACETAMINOPHEN 500 MG PO TABS
1000.0000 mg | ORAL_TABLET | Freq: Once | ORAL | Status: AC
  Administered 2018-01-18: 1000 mg via ORAL
  Filled 2018-01-18: qty 2

## 2018-01-18 MED ORDER — IBUPROFEN 600 MG PO TABS: 600.0000 mg | ORAL_TABLET | Freq: Four times a day (QID) | ORAL | 0 refills | Status: DC | PRN

## 2018-01-18 MED ORDER — PALIPERIDONE ER 9 MG PO TB24: 9.0000 mg | ORAL_TABLET | Freq: Every day | ORAL | 0 refills | Status: DC

## 2018-01-18 NOTE — ED Notes (Signed)
Patient being transported to CT at this time 

## 2018-01-18 NOTE — ED Notes (Signed)
PA at bedside.

## 2018-01-18 NOTE — ED Triage Notes (Addendum)
Patient states yesterday he stood up and hit head forehead against his car door yesterday. States today he has a headache and states it "feels like my forehead keeps popping."  Skin at site of injury intact. No swelling or redness.

## 2018-01-18 NOTE — Discharge Instructions (Addendum)
He can take ibuprofen and Tylenol as prescribed, as needed for your headache.  Please follow-up with the concussion clinic with Dr. Katrinka BlazingSmith for concussion testing.  Please return to the emergency department if you develop any new or worsening symptoms.

## 2018-01-19 NOTE — ED Provider Notes (Signed)
[facility] EMERGENCY DEPARTMENT Provider Note   CSN: [id number] Arrival date & time: 01/18/18  0911     History   Chief Complaint Chief Complaint  Patient presents with  . Headache    HPI Collin White is a 22 y.o. male with history of schizophrenia, psychoses who presents with headache and dizziness after hitting his head on a car door yesterday. Patient did not lose consciousness. He has had a sore spot on his forehead.  He has had a headache and intermittent lightheadedness and dizziness since.  He has had some mild nausea, but no vomiting.  He denies any numbness or tingling.  He denies any drug or alcohol use.  Patient did not take any medications prior to arrival.  HPI  Past Medical History:  Diagnosis Date  . Psychoses (HCC)   . Schizophrenia (HCC)   . Seizures [facility]     Patient Active Problem List   Diagnosis Date Noted  . Acute episode of schizophrenia with history of multiple episodes (HCC) 06/30/2017  . Schizophrenia, paranoid type (HCC) 06/08/2017  . 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 Psychoses (HCC)   . Paranoid schizophrenia (HCC) 09/29/2015  . Cannabis use disorder, moderate, in early remission (HCC) 09/26/2015  . History of posttraumatic stress disorder (PTSD) 09/26/2015    Past Surgical History:  Procedure Laterality Date  . abd surgery s/p traumatic event    . facial reconstructive surgery    . NO PAST SURGERIES  patient stated he had facial reconstruction surgery as a child   . tumor removed from head           Home Medications    Prior to Admission medications   Medication Sig Start Date End Date Taking? Authorizing Provider  acetaminophen (TYLENOL) 500 MG tablet Take 1 tablet (500 mg total) by mouth every 6 (six) hours as needed. 01/18/18   Hakiem Malizia, Waylan Boga, PA-C  citalopram (CELEXA) 20 MG tablet Take 1 tablet (20 mg total) by mouth daily. For depression 01/14/18   Armandina Stammer I, NP  fluticasone (FLONASE) 50  MCG/ACT nasal spray Place 2 sprays into both nostrils daily. For allergies 01/14/18   Armandina Stammer I, NP  hydrOXYzine (ATARAX/VISTARIL) 50 MG tablet Take 1 tablet (50 mg total) by mouth every 6 (six) hours as needed for anxiety. 01/14/18   Armandina Stammer I, NP  ibuprofen (ADVIL,MOTRIN) 600 MG tablet Take 1 tablet (600 mg total) by mouth every 6 (six) hours as needed. 01/18/18   Jaxen Samples, Waylan Boga, PA-C  paliperidone (INVEGA SUSTENNA) 156 MG/ML SUSY injection Inject 1 mL (156 mg total) into the muscle every 28 (twenty-eight) days. (Due on 02-09-18): For mood control 02/09/18   Armandina Stammer I, NP  paliperidone (INVEGA) 9 MG 24 hr tablet Take 1 tablet (9 mg total) by mouth daily. For mood control 01/18/18   Harley Mccartney, Waylan Boga, PA-C  traZODone (DESYREL) 100 MG tablet Take 1 tablet (100 mg total) by mouth at bedtime. For sleep 01/14/18   Sanjuana Kava, NP    Family History Family History  Problem Relation Age of Onset  . Hypertension Other   . Mental illness Neg Hx     Social History Social History   Tobacco Use  . Smoking status: Former Smoker    Types: Cigarettes  . Smokeless tobacco: Never Used  Substance Use Topics  . Alcohol use: No  . Drug use: No     Allergies   Patient has no known allergies.   Review  of Systems Review of Systems  Constitutional: Negative for fever.  Eyes: Negative for visual disturbance.  Musculoskeletal: Negative for neck pain.  Neurological: Positive for dizziness, light-headedness and headaches. Negative for syncope and numbness.     Physical Exam Updated Vital Signs BP (!) 141/90 (BP Location: Right Arm)   Pulse 72   Temp 98.1 F (36.7 C) (Oral)   Resp 16   SpO2 100%   Physical Exam  Constitutional: He appears well-developed and well-nourished. No distress.  HENT:  Head: Normocephalic and atraumatic.  Mouth/Throat: Oropharynx is clear and moist. No oropharyngeal exudate.  Eyes: Pupils are equal, round, and reactive to light. Conjunctivae are normal.  Right eye exhibits no discharge. Left eye exhibits no discharge. No scleral icterus.  Neck: Normal range of motion. Neck supple. No thyromegaly present.  Cardiovascular: Normal rate, regular rhythm, normal heart sounds and intact distal pulses. Exam reveals no gallop and no friction rub.  No murmur heard. Pulmonary/Chest: Effort normal and breath sounds normal. No stridor. No respiratory distress. He has no wheezes. He has no rales.  Abdominal: Soft. Bowel sounds are normal. He exhibits no distension. There is no tenderness. There is no rebound and no guarding.  Musculoskeletal: He exhibits no edema.  Lymphadenopathy:    He has no cervical adenopathy.  Neurological: He is alert. Coordination normal. GCS eye subscore is 4. GCS verbal subscore is 5. GCS motor subscore is 6.  CN 3-12 intact; normal sensation throughout; 5/5 strength in all 4 extremities; equal bilateral grip strength  Skin: Skin is warm and dry. No rash noted. He is not diaphoretic. No pallor.  Psychiatric: His affect is blunt. He is not actively hallucinating. Thought content is delusional. He expresses no homicidal and no suicidal ideation.  Nursing note and vitals reviewed.    ED Treatments / Results  Labs (all labs ordered are listed, but only abnormal results are displayed) Labs Reviewed  RAPID URINE DRUG SCREEN, HOSP PERFORMED    EKG None  Radiology Ct Head Wo Contrast  Result Date: 01/18/2018 CLINICAL DATA:  22 year old male status post blunt trauma to the midline forehead on car door yesterday with loss of consciousness. Headache, lethargy. EXAM: CT HEAD WITHOUT CONTRAST TECHNIQUE: Contiguous axial images were obtained from the base of the skull through the vertex without intravenous contrast. COMPARISON:  Cervical spine radiographs 07/14/2017. Head CT without contrast 03/06/2016. FINDINGS: Brain: Cerebral volume remains normal. Cavum septum pellucidum, normal variant. Unchanged dural thickening along the tentorium.  No midline shift, ventriculomegaly, mass effect, evidence of mass lesion, intracranial hemorrhage or evidence of cortically based acute infarction. Gray-white matter differentiation is within normal limits throughout the brain. Vascular: No suspicious intracranial vascular hyperdensity. Skull: Stable and intact. Sinuses/Orbits: Hyperplastic and clear paranasal sinuses, mastoids, petrous apex and sphenoid air cells. Other: Visible scalp and orbits soft tissues appear stable. IMPRESSION: Stable and normal non contrast appearance of the brain. No acute traumatic injury identified. Electronically Signed   By: Odessa FlemingH  Hall M.D.   On: 01/18/2018 12:07    Procedures Procedures (including critical care time)  Medications Ordered in ED Medications  acetaminophen (TYLENOL) tablet 1,000 mg (1,000 mg Oral Given 01/18/18 1003)     Initial Impression / Assessment and Plan / ED Course  I have reviewed the triage vital signs and the nursing notes.  Pertinent labs & imaging results that were available during my care of the patient were reviewed by me and considered in my medical decision making (see chart for details).  Patient presenting with headache and dizziness after minor head injury yesterday.  Suspect minor concussion.  CT head is negative for acute findings.  Patient's affect and behavior very normal, however on chart review, patient seems to be at baseline.  He reports he is not taking his Invega because he does not have his PCP arranged.  Per chart review, it was prescribed 4 days ago when the patient was discharged from [facility].  Will give another prescription, as well as ibuprofen and Tylenol.  Patient referred to concussion clinic.  Patient understands and agrees with plan.  Patient vitals stable throughout ED course and discharged in satisfactory condition.  Final Clinical Impressions(s) / ED Diagnoses   Final diagnoses:  Bad headache  Minor head injury, initial encounter    ED Discharge Orders         Ordered    paliperidone (INVEGA) 9 MG 24 hr tablet  Daily     01/18/18 1251    ibuprofen (ADVIL,MOTRIN) 600 MG tablet  Every 6 hours PRN     01/18/18 1251    acetaminophen (TYLENOL) 500 MG tablet  Every 6 hours PRN     01/18/18 1251       Kimaya Whitlatch, Waylan Boga, PA-C 01/19/18 1610    Gerhard Munch, MD 01/22/18 2342

## 2018-01-23 ENCOUNTER — Emergency Department (HOSPITAL_COMMUNITY)
Admission: EM | Admit: 2018-01-23 | Discharge: 2018-01-24 | Disposition: A | Discharge status: Home / Self Care | Payer: Medicaid Other | Attending: Emergency Medicine | Admitting: Emergency Medicine

## 2018-01-23 ENCOUNTER — Encounter (HOSPITAL_COMMUNITY): Payer: Self-pay | Admitting: Emergency Medicine

## 2018-01-23 DIAGNOSIS — Z87891 Personal history of nicotine dependence: Secondary | ICD-10-CM | POA: Insufficient documentation

## 2018-01-23 DIAGNOSIS — R110 Nausea: Secondary | ICD-10-CM | POA: Insufficient documentation

## 2018-01-23 DIAGNOSIS — Z79899 Other long term (current) drug therapy: Secondary | ICD-10-CM | POA: Insufficient documentation

## 2018-01-23 LAB — COMPREHENSIVE METABOLIC PANEL
ALK PHOS: 72 U/L (ref 38–126)
ALT: 10 U/L — ABNORMAL LOW (ref 17–63)
ANION GAP: 11 (ref 5–15)
AST: 21 U/L (ref 15–41)
Albumin: 4.5 g/dL (ref 3.5–5.0)
BILIRUBIN TOTAL: 0.4 mg/dL (ref 0.3–1.2)
BUN: 13 mg/dL (ref 6–20)
CALCIUM: 9.1 mg/dL (ref 8.9–10.3)
CO2: 27 mmol/L (ref 22–32)
Chloride: 101 mmol/L (ref 101–111)
Creatinine, Ser: 1.04 mg/dL (ref 0.61–1.24)
GLUCOSE: 94 mg/dL (ref 65–99)
Potassium: 3.4 mmol/L — ABNORMAL LOW (ref 3.5–5.1)
Sodium: 139 mmol/L (ref 135–145)
TOTAL PROTEIN: 7.9 g/dL (ref 6.5–8.1)

## 2018-01-23 LAB — CBC
HCT: 37.7 % — ABNORMAL LOW (ref 39.0–52.0)
HEMOGLOBIN: 12.8 g/dL — AB (ref 13.0–17.0)
MCH: 29.2 pg (ref 26.0–34.0)
MCHC: 34 g/dL (ref 30.0–36.0)
MCV: 86.1 fL (ref 78.0–100.0)
Platelets: 227 10*3/uL (ref 150–400)
RBC: 4.38 MIL/uL (ref 4.22–5.81)
RDW: 12.6 % (ref 11.5–15.5)
WBC: 7.3 10*3/uL (ref 4.0–10.5)

## 2018-01-23 LAB — LIPASE, BLOOD: Lipase: 42 U/L (ref 11–51)

## 2018-01-23 NOTE — ED Triage Notes (Signed)
Patient c/o headache and vomiting x2 days. Denies diarrhea and abdominal pain. Patient clothes saturated from rain. Patient provided with paper scrubs in triage.

## 2018-01-24 LAB — URINALYSIS, ROUTINE W REFLEX MICROSCOPIC
BACTERIA UA: NONE SEEN
BILIRUBIN URINE: NEGATIVE
Glucose, UA: NEGATIVE mg/dL
HGB URINE DIPSTICK: NEGATIVE
KETONES UR: NEGATIVE mg/dL
Leukocytes, UA: NEGATIVE
NITRITE: NEGATIVE
Protein, ur: NEGATIVE mg/dL
Specific Gravity, Urine: 1.02 (ref 1.005–1.030)
pH: 6 (ref 5.0–8.0)

## 2018-01-24 MED ORDER — ONDANSETRON 4 MG PO TBDP: 4.0000 mg | ORAL_TABLET | Freq: Three times a day (TID) | ORAL | 0 refills | Status: DC | PRN

## 2018-01-24 MED ORDER — ONDANSETRON 4 MG PO TBDP
4.0000 mg | ORAL_TABLET | Freq: Once | ORAL | Status: AC
  Administered 2018-01-24: 4 mg via ORAL
  Filled 2018-01-24: qty 1

## 2018-01-24 NOTE — ED Provider Notes (Signed)
[facility]-EMERGENCY DEPT Provider Note   CSN: [id number] Arrival date & time: 01/23/18  2056     History   Chief Complaint Chief Complaint  Patient presents with  . Emesis  . Headache    HPI Collin White is a 22 y.o. male.  HPI Patient presents with headache and vomiting.  Has reportedly of headache since he hit his head a few days ago.  States that nausea and vomiting preceded that.  Somewhat decreased appetite.  States he was able to eat today but still had some nausea.  Has been vomiting previously.  Dull headache.  Had head CT done a few days ago.  No abdominal pain.  No diarrhea. Past Medical History:  Diagnosis Date  . Psychoses (HCC)   . Schizophrenia (HCC)   . Seizures [facility](HCC)     Patient Active Problem List   Diagnosis Date Noted  . Acute episode of schizophrenia with history of multiple episodes (HCC) 06/30/2017  . Schizophrenia, paranoid type (HCC) 06/08/2017  . 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 Psychoses (HCC)   . Paranoid schizophrenia (HCC) 09/29/2015  . Cannabis use disorder, moderate, in early remission (HCC) 09/26/2015  . History of posttraumatic stress disorder (PTSD) 09/26/2015    Past Surgical History:  Procedure Laterality Date  . abd surgery s/p traumatic event    . facial reconstructive surgery    . NO PAST SURGERIES  patient stated he had facial reconstruction surgery as a child   . tumor removed from head           Home Medications    Prior to Admission medications   Medication Sig Start Date End Date Taking? Authorizing Provider  acetaminophen (TYLENOL) 500 MG tablet Take 1 tablet (500 mg total) by mouth every 6 (six) hours as needed. 01/18/18   Law, Waylan BogaAlexandra M, PA-C  citalopram (CELEXA) 20 MG tablet Take 1 tablet (20 mg total) by mouth daily. For depression 01/14/18   Armandina StammerNwoko, Agnes I, NP  fluticasone (FLONASE) 50 MCG/ACT nasal spray Place 2 sprays into both nostrils daily. For allergies 01/14/18    Armandina StammerNwoko, Agnes I, NP  hydrOXYzine (ATARAX/VISTARIL) 50 MG tablet Take 1 tablet (50 mg total) by mouth every 6 (six) hours as needed for anxiety. 01/14/18   Armandina StammerNwoko, Agnes I, NP  ibuprofen (ADVIL,MOTRIN) 600 MG tablet Take 1 tablet (600 mg total) by mouth every 6 (six) hours as needed. 01/18/18   Law, Waylan BogaAlexandra M, PA-C  paliperidone (INVEGA SUSTENNA) 156 MG/ML SUSY injection Inject 1 mL (156 mg total) into the muscle every 28 (twenty-eight) days. (Due on 02-09-18): For mood control 02/09/18   Armandina StammerNwoko, Agnes I, NP  paliperidone (INVEGA) 9 MG 24 hr tablet Take 1 tablet (9 mg total) by mouth daily. For mood control 01/18/18   Law, Waylan BogaAlexandra M, PA-C  traZODone (DESYREL) 100 MG tablet Take 1 tablet (100 mg total) by mouth at bedtime. For sleep 01/14/18   Sanjuana KavaNwoko, Agnes I, NP    Family History Family History  Problem Relation Age of Onset  . Hypertension Other   . Mental illness Neg Hx     Social History Social History   Tobacco Use  . Smoking status: Former Smoker    Types: Cigarettes  . Smokeless tobacco: Never Used  Substance Use Topics  . Alcohol use: No  . Drug use: No     Allergies   Patient has no known allergies.   Review of Systems Review of Systems  Constitutional: Positive for appetite change.  HENT: Negative for congestion.   Respiratory: Negative for shortness of breath.   Cardiovascular: Negative for chest pain.  Gastrointestinal: Positive for nausea and vomiting. Negative for abdominal pain.  Genitourinary: Negative for flank pain.  Musculoskeletal: Negative for back pain.  Skin: Negative for rash.  Neurological: Positive for headaches.  Hematological: Negative for adenopathy.  Psychiatric/Behavioral: Negative for confusion.     Physical Exam Updated Vital Signs BP (!) 127/91 (BP Location: Left Arm)   Pulse 80   Temp (!) 97.4 F (36.3 C) (Oral)   Resp 15   Ht 6' (1.829 m)   Wt 63.5 kg (140 lb)   SpO2 100%   BMI 18.99 kg/m   Physical Exam  Constitutional: He  appears well-developed.  HENT:  Mild swelling on forehead.  Eyes: Pupils are equal, round, and reactive to light.  Cardiovascular: Normal rate.  Pulmonary/Chest: Effort normal.  Abdominal: Soft.  Musculoskeletal: He exhibits no tenderness.  Neurological: He is alert.  Awake and appropriate, but somewhat slow to answer.  Reviewing records this appears to be his baseline.  Skin: Skin is warm.  Psychiatric:  Somewhat flat affect     ED Treatments / Results  Labs (all labs ordered are listed, but only abnormal results are displayed) Labs Reviewed  COMPREHENSIVE METABOLIC PANEL - Abnormal; Notable for the following components:      Result Value   Potassium 3.4 (*)    ALT 10 (*)    All other components within normal limits  CBC - Abnormal; Notable for the following components:   Hemoglobin 12.8 (*)    HCT 37.7 (*)    All other components within normal limits  LIPASE, BLOOD  URINALYSIS, ROUTINE W REFLEX MICROSCOPIC    EKG EKG Interpretation  Date/Time:  Saturday January 23 2018 23:34:58 EDT Ventricular Rate:  49 PR Interval:    QRS Duration: 98 QT Interval:  444 QTC Calculation: 401 R Axis:   71 Text Interpretation:  Sinus bradycardia Borderline T wave abnormalities ST elevation, consider inferior injury Confirmed by Benjiman Core [phone]) on 01/23/2018 11:58:13 PM   Radiology No results found.  Procedures Procedures (including critical care time)  Medications Ordered in ED Medications  ondansetron (ZOFRAN-ODT) disintegrating tablet 4 mg (has no administration in time range)     Initial Impression / Assessment and Plan / ED Course  I have reviewed the triage vital signs and the nursing notes.  Pertinent labs & imaging results that were available during my care of the patient were reviewed by me and considered in my medical decision making (see chart for details).     Patient presents with nausea vomiting and headache.  Potential recent concussion.  Labs  reassuring and tolerated orals.  Recent head CT.  Felt better after Zofran.  Discharge home per  Final Clinical Impressions(s) / ED Diagnoses   Final diagnoses:  None    ED Discharge Orders    None       Benjiman Core, MD 01/24/18 [phone]

## 2018-01-24 NOTE — ED Notes (Signed)
Pt attempting to give urine specimen at this time and has been given gingerale and crackers for PO challenge.

## 2018-01-25 ENCOUNTER — Encounter (HOSPITAL_COMMUNITY): Payer: Self-pay | Admitting: Emergency Medicine

## 2018-01-25 ENCOUNTER — Emergency Department (HOSPITAL_COMMUNITY)
Admission: EM | Admit: 2018-01-25 | Discharge: 2018-01-25 | Disposition: A | Discharge status: Home / Self Care | Payer: Medicaid Other | Attending: Physician Assistant | Admitting: Physician Assistant

## 2018-01-25 DIAGNOSIS — R51 Headache: Secondary | ICD-10-CM | POA: Insufficient documentation

## 2018-01-25 DIAGNOSIS — Z87891 Personal history of nicotine dependence: Secondary | ICD-10-CM | POA: Insufficient documentation

## 2018-01-25 DIAGNOSIS — R519 Headache, unspecified: Secondary | ICD-10-CM

## 2018-01-25 HISTORY — DX: Migraine, unspecified, not intractable, without status migrainosus: G43.909

## 2018-01-25 MED ORDER — KETOROLAC TROMETHAMINE 30 MG/ML IJ SOLN
30.0000 mg | Freq: Once | INTRAMUSCULAR | Status: AC
  Administered 2018-01-25: 30 mg via INTRAMUSCULAR
  Filled 2018-01-25: qty 1

## 2018-01-25 NOTE — ED Provider Notes (Signed)
[facility]-EMERGENCY DEPT Provider Note   CSN: [id number] Arrival date & time: 01/25/18  1755     History   Chief Complaint Chief Complaint  Patient presents with  . Insect Bite    HPI Collin White is a 21 y.o. male.  Collin White is a 22 y.o. Male with a history of migraines, seizures, schizophrenia and psychosis, presents to the emergency department for evaluation of reported spider bite to the right side of the back of his head, patient also reports having a headache.  He reports he thinks he was bit by a small red spider about 2 weeks ago, and noticed a bump or scab on the back of his head, he denies surrounding pain or redness, has had no drainage from the area that he can tell.  No fevers.  Of note patient has been seen multiple times over the past 2 weeks and has not mentioned this.  Patient also reports at about 4 PM today he developed a right-sided headache, patient has chronic migraines and has been seen numerous times in the department for similar.  He denies any vision changes, dizziness, nausea or vomiting, no numbness, weakness or tingling in any of his extremities.  No neck pain or stiffness.  No generalized myalgias.  Patient reports his legs are little bit sore but he has been up walking for most of the day, no weakness or numbness.  Nothing prior to arrival to treat his symptoms.     Past Medical History:  Diagnosis Date  . Migraines   . Psychoses (HCC)   . Schizophrenia (HCC)   . Seizures [facility])     Patient Active Problem List   Diagnosis Date Noted  . Acute episode of schizophrenia with history of multiple episodes (HCC) 06/30/2017  . Schizophrenia, paranoid type (HCC) 06/08/2017  . 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 Psychoses (HCC)   . Paranoid schizophrenia (HCC) 09/29/2015  . Cannabis use disorder, moderate, in early remission (HCC) 09/26/2015  . History of posttraumatic stress disorder (PTSD) 09/26/2015    Past  Surgical History:  Procedure Laterality Date  . abd surgery s/p traumatic event    . facial reconstructive surgery    . NO PAST SURGERIES  patient stated he had facial reconstruction surgery as a child   . tumor removed from head           Home Medications    Prior to Admission medications   Medication Sig Start Date End Date Taking? Authorizing Provider  acetaminophen (TYLENOL) 500 MG tablet Take 1 tablet (500 mg total) by mouth every 6 (six) hours as needed. Patient not taking: Reported on 01/25/2018 01/18/18   Emi Holes, PA-C  citalopram (CELEXA) 20 MG tablet Take 1 tablet (20 mg total) by mouth daily. For depression Patient not taking: Reported on 01/25/2018 01/14/18   Armandina Stammer I, NP  fluticasone (FLONASE) 50 MCG/ACT nasal spray Place 2 sprays into both nostrils daily. For allergies Patient not taking: Reported on 01/25/2018 01/14/18   Armandina Stammer I, NP  hydrOXYzine (ATARAX/VISTARIL) 50 MG tablet Take 1 tablet (50 mg total) by mouth every 6 (six) hours as needed for anxiety. Patient not taking: Reported on 01/25/2018 01/14/18   Armandina Stammer I, NP  ibuprofen (ADVIL,MOTRIN) 600 MG tablet Take 1 tablet (600 mg total) by mouth every 6 (six) hours as needed. Patient not taking: Reported on 01/25/2018 01/18/18   Emi Holes, PA-C  ondansetron (ZOFRAN-ODT) 4 MG disintegrating tablet Take 1 tablet (  4 mg total) by mouth every 8 (eight) hours as needed for nausea or vomiting. Patient not taking: Reported on 01/25/2018 01/24/18   Benjiman Core, MD  paliperidone (INVEGA SUSTENNA) 156 MG/ML SUSY injection Inject 1 mL (156 mg total) into the muscle every 28 (twenty-eight) days. (Due on 02-09-18): For mood control Patient not taking: Reported on 01/25/2018 02/09/18   Armandina Stammer I, NP  paliperidone (INVEGA) 9 MG 24 hr tablet Take 1 tablet (9 mg total) by mouth daily. For mood control Patient not taking: Reported on 01/25/2018 01/18/18   Emi Holes, PA-C  traZODone (DESYREL) 100 MG tablet  Take 1 tablet (100 mg total) by mouth at bedtime. For sleep Patient not taking: Reported on 01/25/2018 01/14/18   Sanjuana Kava, NP    Family History Family History  Problem Relation Age of Onset  . Hypertension Other   . Mental illness Neg Hx     Social History Social History   Tobacco Use  . Smoking status: Former Smoker    Types: Cigarettes  . Smokeless tobacco: Never Used  Substance Use Topics  . Alcohol use: No  . Drug use: No     Allergies   Patient has no known allergies.   Review of Systems Review of Systems  Constitutional: Negative for chills and fever.  HENT: Negative for congestion, rhinorrhea and sore throat.   Eyes: Negative for visual disturbance.  Respiratory: Negative for shortness of breath.   Cardiovascular: Negative for chest pain.  Gastrointestinal: Negative for abdominal pain, nausea and vomiting.  Musculoskeletal: Negative for arthralgias and joint swelling.  Skin: Negative for color change and rash.       Insect bite  Neurological: Positive for headaches. Negative for dizziness, seizures, syncope, facial asymmetry, weakness, light-headedness and numbness.     Physical Exam Updated Vital Signs BP 105/72 (BP Location: Left Arm)   Pulse 77   Temp 98.4 F (36.9 C) (Oral)   Resp 16   SpO2 100%   Physical Exam  Constitutional: He is oriented to person, place, and time. He appears well-developed and well-nourished. No distress.  Patient with disheveled appearance, close are soiled, patient is malodorous, he speaks very quietly throughout encounter  HENT:  Head: Normocephalic and atraumatic.  Small lesion noted on the right side of the back of the head at the base of the neck, it appears to be a small, known, palpation resulted in expression of small amount of waxy sebum, no surrounding erythema or induration, no other lesions noted  Eyes: Right eye exhibits no discharge. Left eye exhibits no discharge.  Neck: Neck supple.  No rigidity with  range of motion in all directions, no tenderness to palpation over the neck  Cardiovascular: Normal rate, regular rhythm, normal heart sounds and intact distal pulses.  Pulmonary/Chest: Effort normal and breath sounds normal. No respiratory distress.  Abdominal: Soft. Bowel sounds are normal. He exhibits no distension and no mass. There is no tenderness. There is no guarding.  Neurological: He is alert and oriented to person, place, and time. Coordination normal.  Speech is clear, able to follow commands CN III-XII intact Normal strength in upper and lower extremities bilaterally including dorsiflexion and plantar flexion, strong and equal grip strength Sensation normal to light and sharp touch Moves extremities without ataxia, coordination intact Normal finger to nose and rapid alternating movements No pronator drift  Skin: Skin is warm and dry. Capillary refill takes less than 2 seconds. He is not diaphoretic.  Psychiatric: He has  a normal mood and affect. His behavior is normal.  Nursing note and vitals reviewed.    ED Treatments / Results  Labs (all labs ordered are listed, but only abnormal results are displayed) Labs Reviewed - No data to display  EKG None  Radiology No results found.  Procedures Procedures (including critical care time)  Medications Ordered in ED Medications  ketorolac (TORADOL) 30 MG/ML injection 30 mg (30 mg Intramuscular Given 01/25/18 1940)     Initial Impression / Assessment and Plan / ED Course  I have reviewed the triage vital signs and the nursing notes.  Pertinent labs & imaging results that were available during my care of the patient were reviewed by me and considered in my medical decision making (see chart for details).  Patient presents for evaluation of presumed spider bite on the back of the head, patient thinks this happens about 2 weeks ago but noticed it today.  On exam there is a small pimple-like lesion in that produces waxy  sebum, I think this is more likely a common dome then a insect bite, there is no surrounding erythema or induration to suggest superinfection.  Patient also reports headache, he is seen frequently in the emergency department for these headaches.  He had a recent head imaging approximately 7 days ago after a minor head trauma which was reassuring.  Patient has normal neurologic exam.  Headache is already improving here in the ED after Toradol.  Do not feel additional imaging is necessary at this time.  At this time patient is stable for discharge home, normal vitals.  He is encouraged to follow-up with primary care.  Return precautions discussed.  Final Clinical Impressions(s) / ED Diagnoses   Final diagnoses:  Bad headache    ED Discharge Orders    None       Dartha LodgeFord, Matrice Herro N, [place] JerseyPA-C 01/25/18 2019    Abelino DerrickMackuen, Courteney Lyn, MD 01/25/18 2251

## 2018-01-25 NOTE — Discharge Instructions (Addendum)
Your neurologic exam is very reassuring today.  Continue use Tylenol or ibuprofen as needed for headaches.  There was a small bump on the back of your head that appeared to be a pimple, no evidence of bug bite or surrounding infection today, this likely has already healed.  These follow-up with your regular doctor.  Return if any of the below scenarios develop.  Get help right away if: Your headache becomes really bad. You keep throwing up. You have a stiff neck. You have trouble seeing. You have trouble speaking. You have pain in the eye or ear. Your muscles are weak or you lose muscle control. You lose your balance or have trouble walking. You feel like you will pass out (faint) or you pass out. You have confusion.

## 2018-01-25 NOTE — ED Triage Notes (Signed)
Per pt, states he was bit by a spider on the back of his right, lower back of head-states he noticed it today-states he is also having a headache

## 2018-01-28 ENCOUNTER — Encounter (HOSPITAL_COMMUNITY): Payer: Self-pay

## 2018-01-28 DIAGNOSIS — S60321A Blister (nonthermal) of right thumb, initial encounter: Secondary | ICD-10-CM | POA: Insufficient documentation

## 2018-01-28 DIAGNOSIS — X58XXXA Exposure to other specified factors, initial encounter: Secondary | ICD-10-CM | POA: Insufficient documentation

## 2018-01-28 DIAGNOSIS — Y999 Unspecified external cause status: Secondary | ICD-10-CM | POA: Insufficient documentation

## 2018-01-28 DIAGNOSIS — Y929 Unspecified place or not applicable: Secondary | ICD-10-CM | POA: Insufficient documentation

## 2018-01-28 DIAGNOSIS — Y939 Activity, unspecified: Secondary | ICD-10-CM | POA: Insufficient documentation

## 2018-01-28 DIAGNOSIS — F201 Disorganized schizophrenia: Secondary | ICD-10-CM | POA: Insufficient documentation

## 2018-01-28 NOTE — ED Triage Notes (Signed)
Pt presents with a blister on his R thumb. He states it "just appeared." He also states that his R foot burns when he lays down.

## 2018-01-29 ENCOUNTER — Emergency Department (HOSPITAL_COMMUNITY)
Admission: EM | Admit: 2018-01-29 | Discharge: 2018-01-29 | Disposition: A | Discharge status: Home / Self Care | Payer: Medicaid Other | Attending: Emergency Medicine | Admitting: Emergency Medicine

## 2018-01-29 DIAGNOSIS — F201 Disorganized schizophrenia: Secondary | ICD-10-CM

## 2018-01-29 DIAGNOSIS — S60321A Blister (nonthermal) of right thumb, initial encounter: Secondary | ICD-10-CM

## 2018-01-29 NOTE — ED Notes (Signed)
Pt drinking from sink. Pt instructed to not drink from sink. Pt continuing to drink from sink.

## 2018-01-29 NOTE — ED Provider Notes (Signed)
[facility]-EMERGENCY DEPT Provider Note: [name], MD, FACEP  CSN: [id number] MRN: [id number] ARRIVAL: 01/28/18 at 2155 ROOM: WA02/WA02   CHIEF COMPLAINT  Blister (R thumb)  Level 5 caveat: Schizophrenia HISTORY OF PRESENT ILLNESS  01/29/18 12:54 AM Collin White is a 22 y.o. male with a history of schizophrenia.  He is come planing of a blister to his right thumb.  He states that just appeared yesterday.  He states the blister is "transitional" and is a "10 out of 10".  He also states he is pregnant and that the anterior sole of his right foot burns when he lies down.  He denies suicidal ideation.  Most of his speech is rambling and incoherent and a complete history and review of systems cannot be obtained.  It is noted that he has had 24 visits to the ED in the past 6 months.   Past Medical History:  Diagnosis Date  . Migraines   . Psychoses (HCC)   . Schizophrenia (HCC)   . Seizures (HCC)     Past Surgical History:  Procedure Laterality Date  . abd surgery s/p traumatic event    . facial reconstructive surgery    . NO PAST SURGERIES  patient stated he had facial reconstruction surgery as a child   . tumor removed from head       Family History  Problem Relation Age of Onset  . Hypertension Other   . Mental illness Neg Hx     Social History   Tobacco Use  . Smoking status: Former Smoker    Types: Cigarettes  . Smokeless tobacco: Never Used  Substance Use Topics  . Alcohol use: No  . Drug use: No    Prior to Admission medications   Medication Sig Start Date End Date Taking? Authorizing Provider  acetaminophen (TYLENOL) 500 MG tablet Take 1 tablet (500 mg total) by mouth every 6 (six) hours as needed. Patient not taking: Reported on 01/25/2018 01/18/18   Emi Holes, PA-C  citalopram (CELEXA) 20 MG tablet Take 1 tablet (20 mg total) by mouth daily. For depression Patient not taking: Reported on 01/25/2018 01/14/18   Armandina Stammer I, NP  fluticasone (FLONASE) 50  MCG/ACT nasal spray Place 2 sprays into both nostrils daily. For allergies Patient not taking: Reported on 01/25/2018 01/14/18   Armandina Stammer I, NP  hydrOXYzine (ATARAX/VISTARIL) 50 MG tablet Take 1 tablet (50 mg total) by mouth every 6 (six) hours as needed for anxiety. Patient not taking: Reported on 01/25/2018 01/14/18   Armandina Stammer I, NP  ibuprofen (ADVIL,MOTRIN) 600 MG tablet Take 1 tablet (600 mg total) by mouth every 6 (six) hours as needed. Patient not taking: Reported on 01/25/2018 01/18/18   Emi Holes, PA-C  ondansetron (ZOFRAN-ODT) 4 MG disintegrating tablet Take 1 tablet (4 mg total) by mouth every 8 (eight) hours as needed for nausea or vomiting. Patient not taking: Reported on 01/25/2018 01/24/18   Benjiman Core, MD  paliperidone (INVEGA SUSTENNA) 156 MG/ML SUSY injection Inject 1 mL (156 mg total) into the muscle every 28 (twenty-eight) days. (Due on 02-09-18): For mood control Patient not taking: Reported on 01/25/2018 02/09/18   Armandina Stammer I, NP  paliperidone (INVEGA) 9 MG 24 hr tablet Take 1 tablet (9 mg total) by mouth daily. For mood control Patient not taking: Reported on 01/25/2018 01/18/18   Emi Holes, PA-C  traZODone (DESYREL) 100 MG tablet Take 1 tablet (100 mg total) by mouth at bedtime. For sleep  Patient not taking: Reported on 01/25/2018 01/14/18   Armandina StammerNwoko, Agnes I, NP    Allergies Patient has no known allergies.   REVIEW OF SYSTEMS     PHYSICAL EXAMINATION  Initial Vital Signs Blood pressure 123/85, pulse 75, temperature 97.7 F (36.5 C), temperature source Oral, resp. rate 16, SpO2 100 %.  Examination General: Well-developed, well-nourished male in no acute distress; appearance consistent with age of record HENT: normocephalic; atraumatic Eyes: pupils equal, round and reactive to light; extraocular muscles intact Neck: supple Heart: regular rate and rhythm Lungs: clear to auscultation bilaterally Abdomen: soft; mildly distended; nontender; bowel  sounds present Extremities: No deformity; full range of motion; patient would not remove right shoe for examination Neurologic: Awake, alert; motor function intact in all extremities and symmetric; no facial droop Skin: Warm and dry; small slightly hemorrhagic blister to pad of right thumb consistent with small burn or friction injury Psychiatric: Rambling, incoherent speech; denies SI   RESULTS  Summary of this visit's results, reviewed by myself:   EKG Interpretation  Date/Time:    Ventricular Rate:    PR Interval:    QRS Duration:   QT Interval:    QTC Calculation:   R Axis:     Text Interpretation:        Laboratory Studies: No results found for this or any previous visit (from the past 24 hour(s)). Imaging Studies: No results found.  ED COURSE and MDM  Nursing notes and initial vitals signs, including pulse oximetry, reviewed.  Vitals:   01/28/18 2244  BP: 123/85  Pulse: 75  Resp: 16  Temp: 97.7 F (36.5 C)  TempSrc: Oral  SpO2: 100%   The patient does not appear to be a danger to himself or others at this time.  We will provide local wound care.  PROCEDURES    ED DIAGNOSES     ICD-10-CM   1. 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

## 2018-01-30 ENCOUNTER — Encounter (HOSPITAL_COMMUNITY): Payer: Self-pay | Admitting: *Deleted

## 2018-01-30 ENCOUNTER — Emergency Department (HOSPITAL_COMMUNITY)
Admission: EM | Admit: 2018-01-30 | Discharge: 2018-01-30 | Disposition: A | Discharge status: Home / Self Care | Payer: Medicaid Other | Attending: Emergency Medicine | Admitting: Emergency Medicine

## 2018-01-30 DIAGNOSIS — Z87891 Personal history of nicotine dependence: Secondary | ICD-10-CM | POA: Insufficient documentation

## 2018-01-30 DIAGNOSIS — Y929 Unspecified place or not applicable: Secondary | ICD-10-CM | POA: Insufficient documentation

## 2018-01-30 DIAGNOSIS — Y999 Unspecified external cause status: Secondary | ICD-10-CM | POA: Insufficient documentation

## 2018-01-30 DIAGNOSIS — S60321A Blister (nonthermal) of right thumb, initial encounter: Secondary | ICD-10-CM

## 2018-01-30 DIAGNOSIS — X503XXA Overexertion from repetitive movements, initial encounter: Secondary | ICD-10-CM | POA: Insufficient documentation

## 2018-01-30 DIAGNOSIS — Y939 Activity, unspecified: Secondary | ICD-10-CM | POA: Insufficient documentation

## 2018-01-30 MED ORDER — BACITRACIN ZINC 500 UNIT/GM EX OINT
TOPICAL_OINTMENT | Freq: Once | CUTANEOUS | Status: AC
  Administered 2018-01-30: 1 via TOPICAL

## 2018-01-30 MED ORDER — BACITRACIN ZINC 500 UNIT/GM EX OINT
TOPICAL_OINTMENT | CUTANEOUS | Status: AC
  Filled 2018-01-30: qty 0.9

## 2018-01-30 NOTE — ED Triage Notes (Signed)
Pt complains of blister to right thumb. Pt states his unable to hold anything with his hand. Pt also complains of right foot burning.

## 2018-01-30 NOTE — Discharge Instructions (Signed)
Follow up with your doctor.  Return here as needed 

## 2018-01-30 NOTE — ED Provider Notes (Signed)
[facility]-EMERG[facility] DEPT Provider Note   CSN: [id number] Arrival date & time: 01/30/18  1444     History   Chief Complaint Chief Complaint  Patient presents with  . Blister    right thumb  . Foot Pain    right    HPI Collin White is a 22 y.o. male with hx of mental health problems who presents to the ED with a blister to the right thumb. Patient is right hand dominant and states that he works with his hand a lot. He also states that he was trying to light a blunt and had to try the lighter several time before it worked but does not thinks that caused the blister. Patient states he just wants it to go away. HPI  Past Medical History:  Diagnosis Date  . Migraines   . Psychoses (HCC)   . Schizophrenia (HCC)   . Seizures [facility])     Patient Active Problem List   Diagnosis Date Noted  . Acute episode of schizophrenia with history of multiple episodes (HCC) 06/30/2017  . Schizophrenia, paranoid type (HCC) 06/08/2017  . 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 Psychoses (HCC)   . Paranoid schizophrenia (HCC) 09/29/2015  . Cannabis use disorder, moderate, in early remission (HCC) 09/26/2015  . History of posttraumatic stress disorder (PTSD) 09/26/2015    Past Surgical History:  Procedure Laterality Date  . abd surgery s/p traumatic event    . facial reconstructive surgery    . NO PAST SURGERIES  patient stated he had facial reconstruction surgery as a child   . tumor removed from head           Home Medications    Prior to Admission medications   Not on File    Family History Family History  Problem Relation Age of Onset  . Hypertension Other   . Mental illness Neg Hx     Social History Social History   Tobacco Use  . Smoking status: Former Smoker    Types: Cigarettes  . Smokeless tobacco: Never Used  Substance Use Topics  . Alcohol use: No  . Drug use: No     Allergies   Patient has no known allergies.   Review  of Systems Review of Systems  HENT: Negative.   Respiratory: Negative for cough and shortness of breath.   Cardiovascular: Negative for chest pain.  Gastrointestinal: Negative for abdominal pain.  Skin:       Blister to right thumb     Physical Exam Updated Vital Signs BP 121/86 (BP Location: Right Arm)   Pulse 99   Temp (!) 97.5 F (36.4 C) (Oral)   Resp 16   SpO2 98%   Physical Exam  Constitutional: He appears well-developed and well-nourished. No distress.  Eyes: EOM are normal.  Neck: Neck supple.  Pulmonary/Chest: Effort normal.  Musculoskeletal: Normal range of motion.       Right hand: He exhibits tenderness. He exhibits normal range of motion and no laceration. Normal sensation noted. Normal strength noted.  There is a small blister that is intact to the right thumb palmar aspect. No redness or signs of infection.  Neurological: He is alert.  Skin: Skin is warm and dry.  Nursing note and vitals reviewed.    ED Treatments / Results  Labs (all labs ordered are listed, but only abnormal results are displayed) Labs Reviewed - No data to display  EKG None  Radiology No results found.  Procedures Procedures (including critical  care time)  Medications Ordered in ED Medications  bacitracin ointment (1 application Topical Given 01/30/18 1530)     Initial Impression / Assessment and Plan / ED Course  I have reviewed the triage vital signs and the nursing notes. 22 y.o. male here with a blister to the right thumb stable for d/c without signs of infection. Discussed that we will not open the area will apply dressing and patient to f/u with PCP.   Final Clinical Impressions(s) / ED Diagnoses   Final diagnoses:  Blister of right thumb, initial encounter    ED Discharge Orders    None       Kerrie Buffaloeese, Beaulah Romanek Ash[place]M, [place]NP 01/30/18 Conley Simmonds1937    Long, Joshua G, MD 01/31/18 2156

## 2018-02-01 ENCOUNTER — Encounter (HOSPITAL_COMMUNITY): Payer: Self-pay

## 2018-02-01 ENCOUNTER — Other Ambulatory Visit: Payer: Self-pay

## 2018-02-01 DIAGNOSIS — Z87891 Personal history of nicotine dependence: Secondary | ICD-10-CM | POA: Insufficient documentation

## 2018-02-01 DIAGNOSIS — R208 Other disturbances of skin sensation: Secondary | ICD-10-CM | POA: Insufficient documentation

## 2018-02-01 NOTE — ED Triage Notes (Signed)
PT C/O BURNING TO THE BOTTOM OF THE RIGHT FOOT, BURNING TO THE BILATERAL BUTTOCKS, AND NUMBNESS TO THE RIGHT 2ND AND 3RD FINGERS. PT DENIES INJURY TO THE FINGERS AND FOOT, AND DENIES ITCHING TO THE BUTTOCKS.

## 2018-02-02 ENCOUNTER — Emergency Department (HOSPITAL_COMMUNITY)
Admission: EM | Admit: 2018-02-02 | Discharge: 2018-02-02 | Disposition: A | Discharge status: Home / Self Care | Payer: Medicaid Other | Attending: Emergency Medicine | Admitting: Emergency Medicine

## 2018-02-02 DIAGNOSIS — R208 Other disturbances of skin sensation: Secondary | ICD-10-CM

## 2018-02-02 NOTE — ED Notes (Signed)
Upon assessment with PA, pt covered in dried feces. Given wash rags and cream to assist in cleaning pt.

## 2018-02-02 NOTE — ED Provider Notes (Signed)
[facility]-EMERGENCY DEPT Provider Note   CSN: [id number] Arrival date & time: 02/01/18  1908     History   Chief Complaint Chief Complaint  Patient presents with  . Foot Pain    Right    HPI Collin White is a 22 y.o. male.  Patient presents to the emergency department with a chief complaint of burning on his butt.  He states that this started today.  He also complains of a blister on his right thumb.  He denies any other associated symptoms during my history.  He denies any fevers chills.  He has not taken anything for symptoms.  There are no aggravating or alleviating factors.  The history is provided by the patient. No language interpreter was used.    Past Medical History:  Diagnosis Date  . Migraines   . Psychoses (HCC)   . Schizophrenia (HCC)   . Seizures [facility])     Patient Active Problem List   Diagnosis Date Noted  . Acute episode of schizophrenia with history of multiple episodes (HCC) 06/30/2017  . Schizophrenia, paranoid type (HCC) 06/08/2017  . 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 Psychoses (HCC)   . Paranoid schizophrenia (HCC) 09/29/2015  . Cannabis use disorder, moderate, in early remission (HCC) 09/26/2015  . History of posttraumatic stress disorder (PTSD) 09/26/2015    Past Surgical History:  Procedure Laterality Date  . abd surgery s/p traumatic event    . facial reconstructive surgery    . NO PAST SURGERIES  patient stated he had facial reconstruction surgery as a child   . tumor removed from head           Home Medications    Prior to Admission medications   Not on File    Family History Family History  Problem Relation Age of Onset  . Hypertension Other   . Mental illness Neg Hx     Social History Social History   Tobacco Use  . Smoking status: Former Smoker    Types: Cigarettes  . Smokeless tobacco: Never Used  Substance Use Topics  . Alcohol use: No  . Drug use: No     Allergies    Patient has no known allergies.   Review of Systems Review of Systems  All other systems reviewed and are negative.    Physical Exam Updated Vital Signs BP (!) 129/96 (BP Location: Right Arm)   Pulse (!) 58   Temp 98.3 F (36.8 C) (Oral)   Resp 16   Ht 6' (1.829 m)   Wt 63.5 kg (140 lb)   SpO2 100%   BMI 18.99 kg/m   Physical Exam  Constitutional: He is oriented to person, place, and time. He appears well-developed and well-nourished.  HENT:  Head: Normocephalic and atraumatic.  Eyes: Pupils are equal, round, and reactive to light. Conjunctivae and EOM are normal. Right eye exhibits no discharge. Left eye exhibits no discharge. No scleral icterus.  Neck: Normal range of motion. Neck supple. No JVD present.  Cardiovascular: Normal rate, regular rhythm and normal heart sounds. Exam reveals no gallop and no friction rub.  No murmur heard. Pulmonary/Chest: Effort normal and breath sounds normal. No respiratory distress. He has no wheezes. He has no rales. He exhibits no tenderness.  Abdominal: Soft. He exhibits no distension and no mass. There is no tenderness. There is no rebound and no guarding.  Genitourinary:  Genitourinary Comments: Chaperone present for exam, there is some dry stool around his anus, no rash,  no lesion  Musculoskeletal: Normal range of motion. He exhibits no edema or tenderness.  Neurological: He is alert and oriented to person, place, and time.  Skin: Skin is warm and dry.  Psychiatric: He has a normal mood and affect. His behavior is normal. Judgment and thought content normal.  Nursing note and vitals reviewed.    ED Treatments / Results  Labs (all labs ordered are listed, but only abnormal results are displayed) Labs Reviewed - No data to display  EKG None  Radiology No results found.  Procedures Procedures (including critical care time)  Medications Ordered in ED Medications - No data to display   Initial Impression / Assessment and  Plan / ED Course  I have reviewed the triage vital signs and the nursing notes.  Pertinent labs & imaging results that were available during my care of the patient were reviewed by me and considered in my medical decision making (see chart for details).     Patient with burning sensation of his buttocks.  This could be dry skin or some irritation from the dry stool that is around his anus.  This could be improved by better hygiene, I discussed the importance of keeping the area clean and dry.  His burning sensation could also be sciatica or lumbar radiculopathy.  I will give him low back exercises to see if this benefits.  Final Clinical Impressions(s) / ED Diagnoses   Final diagnoses:  Burning sensation    ED Discharge Orders    None       Roxy HorsemanBrowning, Somnang Mahan, PA-C 02/02/18 0245    Glynn Octaveancour, Stephen, MD 02/02/18 [phone]

## 2018-02-19 ENCOUNTER — Emergency Department (HOSPITAL_COMMUNITY)
Admission: EM | Admit: 2018-02-19 | Discharge: 2018-02-19 | Disposition: A | Discharge status: Home / Self Care | Payer: Medicaid Other | Attending: Emergency Medicine | Admitting: Emergency Medicine

## 2018-02-19 ENCOUNTER — Encounter (HOSPITAL_COMMUNITY): Payer: Self-pay | Admitting: *Deleted

## 2018-02-19 DIAGNOSIS — R4183 Borderline intellectual functioning: Secondary | ICD-10-CM | POA: Insufficient documentation

## 2018-02-19 DIAGNOSIS — L299 Pruritus, unspecified: Secondary | ICD-10-CM | POA: Insufficient documentation

## 2018-02-19 DIAGNOSIS — Z87891 Personal history of nicotine dependence: Secondary | ICD-10-CM | POA: Insufficient documentation

## 2018-02-19 DIAGNOSIS — R143 Flatulence: Secondary | ICD-10-CM | POA: Insufficient documentation

## 2018-02-19 MED ORDER — BISMUTH SUBSALICYLATE 262 MG/15ML PO SUSP
30.0000 mL | Freq: Once | ORAL | Status: AC
  Administered 2018-02-19: 30 mL via ORAL
  Filled 2018-02-19: qty 236

## 2018-02-19 MED ORDER — ACETAMINOPHEN 325 MG PO TABS
650.0000 mg | ORAL_TABLET | Freq: Once | ORAL | Status: AC
  Administered 2018-02-19: 650 mg via ORAL
  Filled 2018-02-19: qty 2

## 2018-02-19 NOTE — ED Provider Notes (Signed)
[facility]-EMERGENCY DEPT Provider Note   CSN: [id number] Arrival date & time: 02/19/18  1858     History   Chief Complaint Chief Complaint  Patient presents with  . Abdominal Pain  . Headache    HPI Salam Micucci is a 22 y.o. male.  HPI   Koty Anctil is a 22 y.o. male, with a history of seizures and schizophrenia, presenting to the ED with abdominal cramping and flatulence after eating blueberries today.  We will not rate or further describe pain. He has had no difficulty having bowel movements. Also complains of an itchy scalp. Denies fever, N/V/C/D, hematochezia/melena, urinary symptoms, or any other complaints.   Past Medical History:  Diagnosis Date  . Migraines   . Psychoses (HCC)   . Schizophrenia (HCC)   . Seizures [facility])     Patient Active Problem List   Diagnosis Date Noted  . Acute episode of schizophrenia with history of multiple episodes (HCC) 06/30/2017  . Schizophrenia, paranoid type (HCC) 06/08/2017  . 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 Psychoses (HCC)   . Paranoid schizophrenia (HCC) 09/29/2015  . Cannabis use disorder, moderate, in early remission (HCC) 09/26/2015  . History of posttraumatic stress disorder (PTSD) 09/26/2015    Past Surgical History:  Procedure Laterality Date  . abd surgery s/p traumatic event    . facial reconstructive surgery    . NO PAST SURGERIES  patient stated he had facial reconstruction surgery as a child   . tumor removed from head           Home Medications    Prior to Admission medications   Not on File    Family History Family History  Problem Relation Age of Onset  . Hypertension Other   . Mental illness Neg Hx     Social History Social History   Tobacco Use  . Smoking status: Former Smoker    Types: Cigarettes  . Smokeless tobacco: Never Used  Substance Use Topics  . Alcohol use: No  . Drug use: No     Allergies   Patient has no known  allergies.   Review of Systems Review of Systems  Constitutional: Negative for chills and fever.  HENT:       Itchy scalp  Respiratory: Negative for shortness of breath.   Cardiovascular: Negative for chest pain.  Gastrointestinal: Positive for abdominal pain. Negative for blood in stool, constipation, diarrhea, nausea and vomiting.  Genitourinary: Negative for dysuria and hematuria.  All other systems reviewed and are negative.    Physical Exam Updated Vital Signs BP (!) 156/85 (BP Location: Left Arm)   Pulse 71   Temp 98.1 F (36.7 C) (Oral)   Resp 18   SpO2 100%   Physical Exam  Constitutional: He appears well-developed and well-nourished. No distress.  HENT:  Head: Normocephalic and atraumatic.  Patient has scaling to his scalp.  Nontender.  No noted papules or pustules.  Has the appearance of dry scalp.  Eyes: Conjunctivae are normal.  Neck: Neck supple.  Cardiovascular: Normal rate, regular rhythm, normal heart sounds and intact distal pulses.  Pulmonary/Chest: Effort normal and breath sounds normal. No respiratory distress.  Abdominal: Soft. Bowel sounds are normal. There is no tenderness. There is no guarding.  Patient has no reaction to palpation.  Musculoskeletal: He exhibits no edema.  Lymphadenopathy:    He has no cervical adenopathy.  Neurological: He is alert.  Skin: Skin is warm and dry. He is not diaphoretic.  Psychiatric:  He has a normal mood and affect. His behavior is normal.  Nursing note and vitals reviewed.    ED Treatments / Results  Labs (all labs ordered are listed, but only abnormal results are displayed) Labs Reviewed - No data to display  EKG None  Radiology No results found.  Procedures Procedures (including critical care time)  Medications Ordered in ED Medications  bismuth subsalicylate (PEPTO BISMOL) 262 MG/15ML suspension 30 mL (has no administration in time range)  acetaminophen (TYLENOL) tablet 650 mg (has no  administration in time range)     Initial Impression / Assessment and Plan / ED Course  I have reviewed the triage vital signs and the nursing notes.  Pertinent labs & imaging results that were available during my care of the patient were reviewed by me and considered in my medical decision making (see chart for details).     Patient presents with abdominal cramping, flatulence, and itchy scalp.  He has a good appetite, despite his complaints. Patient is nontoxic appearing, afebrile, not tachycardic, not tachypneic, not hypotensive, and is in no apparent distress.  No abdominal tenderness on exam. Patient scalp has the appearance of dry skin.  He was provided with supplies for bathing.  End of shift patient care handoff report given to Sharilyn SitesLisa Sanders, PA-C. Plan: Once patient has received his Pepto-Bismol and Tylenol, he may eat, bathe, and then be discharged.  Final Clinical Impressions(s) / ED Diagnoses   Final diagnoses:  Flatulence  Itchy scalp    ED Discharge Orders    None       Concepcion LivingJoy, Shawn C, PA-C 02/19/18 2227    Little, Ambrose Finlandachel Morgan, MD 02/21/18 [phone]

## 2018-02-19 NOTE — ED Triage Notes (Signed)
Pt complains of abdominal pain, black stool and headache for the past 2 days since eating blueberries at [employer].

## 2018-02-19 NOTE — ED Notes (Signed)
Meal given to pt.

## 2018-02-27 ENCOUNTER — Emergency Department (HOSPITAL_COMMUNITY)
Admission: EM | Admit: 2018-02-27 | Discharge: 2018-02-28 | Disposition: A | Discharge status: Home / Self Care | Payer: Medicaid Other | Attending: Emergency Medicine | Admitting: Emergency Medicine

## 2018-02-27 ENCOUNTER — Other Ambulatory Visit: Payer: Self-pay

## 2018-02-27 DIAGNOSIS — Z87891 Personal history of nicotine dependence: Secondary | ICD-10-CM | POA: Insufficient documentation

## 2018-02-27 DIAGNOSIS — Z87890 Personal history of sex reassignment: Secondary | ICD-10-CM | POA: Insufficient documentation

## 2018-02-27 DIAGNOSIS — R5383 Other fatigue: Secondary | ICD-10-CM

## 2018-02-27 DIAGNOSIS — K644 Residual hemorrhoidal skin tags: Secondary | ICD-10-CM | POA: Insufficient documentation

## 2018-02-27 LAB — URINALYSIS, ROUTINE W REFLEX MICROSCOPIC
BILIRUBIN URINE: NEGATIVE
GLUCOSE, UA: NEGATIVE mg/dL
HGB URINE DIPSTICK: NEGATIVE
Ketones, ur: NEGATIVE mg/dL
Leukocytes, UA: NEGATIVE
Nitrite: NEGATIVE
PROTEIN: NEGATIVE mg/dL
Specific Gravity, Urine: 1.028 (ref 1.005–1.030)
pH: 5 (ref 5.0–8.0)

## 2018-02-27 MED ORDER — METOCLOPRAMIDE HCL 10 MG PO TABS
10.0000 mg | ORAL_TABLET | Freq: Once | ORAL | Status: AC
  Administered 2018-02-27: 10 mg via ORAL
  Filled 2018-02-27: qty 1

## 2018-02-27 MED ORDER — HYDROCORTISONE ACETATE 25 MG RE SUPP
25.0000 mg | Freq: Once | RECTAL | Status: AC
Start: 2018-02-27 — End: 2018-02-27
  Administered 2018-02-27: 25 mg via RECTAL
  Filled 2018-02-27: qty 1

## 2018-02-27 MED ORDER — KETOROLAC TROMETHAMINE 30 MG/ML IJ SOLN
30.0000 mg | Freq: Once | INTRAMUSCULAR | Status: AC
  Administered 2018-02-27: 30 mg via INTRAVENOUS
  Filled 2018-02-27: qty 1

## 2018-02-27 NOTE — ED Provider Notes (Signed)
Collin White Provider Note   CSN: [id number] Arrival date & time: 02/27/18  2036     History   Chief Complaint Chief Complaint  Patient presents with  . Fatigue    HPI Collin White is a 22 y.o. MTF transgender patient who goes by "Collin White". Collin White presents today with multiple complaints.  Of note, the patient has 22 visits to the emergency department in the last 6 months.  Chief complaint today is a right-sided frontal headache with photophobia, nausea, and intermittent blurred vision.  Onset 2 days ago.  No treatment prior to arrival.  Collin White states over the counter medications, such as Aleve, typically do not work; however the patient is requesting medication for a headache.   The patient also endorses "achy" rectal discomfort that is worse when having a BM. Reports frequent straining with BMs. No melena, hematochezia, abdominal pain, vomiting, or diarrhea. Also endorses dysuria, onset unknown.  No fever, chills, chest pain, dyspnea, hematuria, dizziness, or lightheadedness.  The history is provided by the patient. No language interpreter was used.    Past Medical History:  Diagnosis Date  . Migraines   . Psychoses (HCC)   . Schizophrenia (HCC)   . Seizures [facility](HCC)     Patient Active Problem List   Diagnosis Date Noted  . Acute episode of schizophrenia with history of multiple episodes (HCC) 06/30/2017  . Schizophrenia, paranoid type (HCC) 06/08/2017  . 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 Psychoses (HCC)   . Paranoid schizophrenia (HCC) 09/29/2015  . Cannabis use disorder, moderate, in early remission (HCC) 09/26/2015  . History of posttraumatic stress disorder (PTSD) 09/26/2015    Past Surgical History:  Procedure Laterality Date  . abd surgery s/p traumatic event    . facial reconstructive surgery    . NO PAST SURGERIES  patient stated he had facial reconstruction surgery as a child   . tumor removed from head            Home Medications    Prior to Admission medications   Not on File    Family History Family History  Problem Relation Age of Onset  . Hypertension Other   . Mental illness Neg Hx     Social History Social History   Tobacco Use  . Smoking status: Former Smoker    Types: Cigarettes  . Smokeless tobacco: Never Used  Substance Use Topics  . Alcohol use: No  . Drug use: No     Allergies   Patient has no known allergies.   Review of Systems Review of Systems  Constitutional: Negative for appetite change, chills, diaphoresis and fever.  Eyes: Positive for photophobia.  Respiratory: Negative for shortness of breath.   Cardiovascular: Negative for chest pain.  Gastrointestinal: Positive for nausea and rectal pain. Negative for abdominal distention, abdominal pain, anal bleeding, blood in stool, constipation, diarrhea and vomiting.  Genitourinary: Positive for dysuria.  Musculoskeletal: Negative for arthralgias, back pain, myalgias, neck pain and neck stiffness.  Skin: Negative for rash.  Allergic/Immunologic: Negative for immunocompromised state.  Neurological: Positive for headaches. Negative for dizziness, weakness and numbness.  Psychiatric/Behavioral: Negative for confusion.     Physical Exam Updated Vital Signs BP 117/90 (BP Location: Right Arm)   Pulse 71   Temp 98.4 F (36.9 C) (Oral)   Resp 14   Ht 6' (1.829 m)   Wt 63.5 kg (140 lb)   SpO2 97%   BMI 18.99 kg/m   Physical Exam  Constitutional: He is  oriented to person, place, and time. He appears well-developed. No distress.  Poor eye contact. Disheveled.   HENT:  Head: Normocephalic.  Eyes: Pupils are equal, round, and reactive to light. Conjunctivae and EOM are normal.  Neck: Normal range of motion. Neck supple.  No meningismus.  Cardiovascular: Normal rate, regular rhythm, normal heart sounds and intact distal pulses. Exam reveals no gallop and no friction rub.  No murmur  heard. Pulmonary/Chest: Effort normal and breath sounds normal. No stridor. No respiratory distress. He has no wheezes. He has no rales. He exhibits no tenderness.  Abdominal: Soft. He exhibits no distension and no mass. There is no tenderness. There is no rebound and no guarding. No hernia.  Genitourinary:  Genitourinary Comments: Several small, nonthrombosed external hemorrhoids are noted.  Neurological: He is alert and oriented to person, place, and time.  Cranial nerves II through XII are grossly intact.  Symmetric tandem gait.  5 out of 5 strength against resistance of the bilateral upper and lower extremities.  Finger-to-nose is intact bilaterally.  Skin: Skin is warm and dry. Capillary refill takes less than 2 seconds. He is not diaphoretic.  Psychiatric: His behavior is normal.  Nursing note and vitals reviewed.  ED Treatments / Results  Labs (all labs ordered are listed, but only abnormal results are displayed) Labs Reviewed  URINALYSIS, ROUTINE W REFLEX MICROSCOPIC    EKG None  Radiology No results found.  Procedures Procedures (including critical care time)  Medications Ordered in ED Medications  ketorolac (TORADOL) 30 MG/ML injection 30 mg (30 mg Intravenous Given 02/27/18 2244)  metoCLOPramide (REGLAN) tablet 10 mg (10 mg Oral Given 02/27/18 2231)  hydrocortisone (ANUSOL-HC) suppository 25 mg (25 mg Rectal Given 02/27/18 2228)     Initial Impression / Assessment and Plan / ED Course  I have reviewed the triage vital signs and the nursing notes.  Pertinent labs & imaging results that were available during my care of the patient were reviewed by me and considered in my medical decision making (see chart for details).     22 year old MTF transgender patient presenting with headache, photophobia, intermittent nausea and blurred vision, dysuria, and rectal pain.  On exam, several nonthrombosed external hemorrhoids are noted.  Anusol ordered.  UA is pending for dysuria.   No focal neurologic deficits on exam.  Of note, the patient has 22 ED visits in the last 6 months.  The patient's medical record was reviewed.  Headache typically resolves with Toradol, which has been ordered along with Reglan for nausea.  Presentation persistence seems consistent with previous ED visits.  At this time, no further urgent or emergent work-up appears indicated.  Doubt CVA, SAH, ICH, meningitis. Patient care transferred to [facility] at the end of my shift, pending UA. Patient presentation, ED course, and plan of care discussed with review of all pertinent labs and imaging. Please see his/her note for further details regarding further ED course and disposition.  Final Clinical Impressions(s) / ED Diagnoses   Final diagnoses:  None    ED Discharge Orders    None       Colbe Viviano A, PA-C 02/27/18 2303    Pricilla Loveless, MD 02/27/18 [phone]

## 2018-02-27 NOTE — ED Triage Notes (Signed)
Pt c/o generalized fatique and weakness admit to occasional nausea but denies lose bowels .

## 2018-02-27 NOTE — ED Notes (Signed)
Urine culture sent down with UA. 

## 2018-02-27 NOTE — ED Provider Notes (Signed)
Patient signed out to me at shift change pending urinalysis.  Plan at shift change is to discharge pending normal urinalysis.  11:43 PM UA is normal.  We will discharged home.  She is in no acute distress.  Vital signs stable.   Roxy HorsemanBrowning, Phuoc Huy, PA-C 02/27/18 2344    Molpus, Jonny RuizJohn, MD 02/28/18 [phone]

## 2018-03-06 ENCOUNTER — Emergency Department (HOSPITAL_COMMUNITY)
Admission: EM | Admit: 2018-03-06 | Discharge: 2018-03-07 | Disposition: A | Discharge status: Home / Self Care | Payer: Medicaid Other | Attending: Emergency Medicine | Admitting: Emergency Medicine

## 2018-03-06 ENCOUNTER — Encounter (HOSPITAL_COMMUNITY): Payer: Self-pay | Admitting: *Deleted

## 2018-03-06 ENCOUNTER — Other Ambulatory Visit: Payer: Self-pay

## 2018-03-06 DIAGNOSIS — R51 Headache: Secondary | ICD-10-CM | POA: Insufficient documentation

## 2018-03-06 DIAGNOSIS — F22 Delusional disorders: Secondary | ICD-10-CM | POA: Insufficient documentation

## 2018-03-06 DIAGNOSIS — Z87891 Personal history of nicotine dependence: Secondary | ICD-10-CM | POA: Insufficient documentation

## 2018-03-06 DIAGNOSIS — R519 Headache, unspecified: Secondary | ICD-10-CM

## 2018-03-06 MED ORDER — ACETAMINOPHEN 325 MG PO TABS
650.0000 mg | ORAL_TABLET | Freq: Once | ORAL | Status: AC
  Administered 2018-03-06: 650 mg via ORAL
  Filled 2018-03-06: qty 2

## 2018-03-06 NOTE — ED Provider Notes (Addendum)
[facility]-EMERGENCY DEPT Provider Note   CSN: [id number] Arrival date & time: 03/06/18  2141     History   Chief Complaint Chief Complaint  Patient presents with  . Headache    HPI Collin White is a 22 y.o. male.  HPI 22 year old African-American male past medical history significant for seizures, schizophrenia, psychosis, migraines, polysubstance abuse presents to the ED for evaluation of headache that he states started 8 PM this evening after touching "poison".  Patient states that it was a [employer] representativeconstruction workers yellow poison.  States that "it was real poison and not drugs".  Patient states he was on his shirt and this caused him to have a headache.  Patient does have a history of migraines and headaches.  He states that the headache is frontal and bilateral temple pain.  Describes it as pounding.  Denies associated vomiting, nausea, vision changes, lightheadedness, dizziness, syncope, photophobia, phonophobia.  Patient did not take any for his pain prior to arrival.  Of note patient has been seen 23 times in the past 6 months.  Patient is not currently taking medications for schizophrenia.  Patient denies any other associated symptoms.  Pain was gradual in onset.   Pt denies any fever, chill, vision changes, lightheadedness, dizziness, congestion, neck pain, cp, sob, cough, abd pain, n/v/d, urinary symptoms, change in bowel habits, melena, hematochezia, lower extremity paresthesias.  Past Medical History:  Diagnosis Date  . Migraines   . Psychoses (HCC)   . Schizophrenia (HCC)   . Seizures [name](HCC)     Patient Active Problem List   Diagnosis Date Noted  . Acute episode of schizophrenia with history of multiple episodes (HCC) 06/30/2017  . Schizophrenia, paranoid type (HCC) 06/08/2017  . 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 Psychoses (HCC)   . Paranoid schizophrenia (HCC) 09/29/2015  . Cannabis use disorder, moderate, in early remission (HCC)  09/26/2015  . History of posttraumatic stress disorder (PTSD) 09/26/2015    Past Surgical History:  Procedure Laterality Date  . abd surgery s/p traumatic event    . facial reconstructive surgery    . NO PAST SURGERIES  patient stated he had facial reconstruction surgery as a child   . tumor removed from head           Home Medications    Prior to Admission medications   Not on File    Family History Family History  Problem Relation Age of Onset  . Hypertension Other   . Mental illness Neg Hx     Social History Social History   Tobacco Use  . Smoking status: Former Smoker    Types: Cigarettes  . Smokeless tobacco: Never Used  Substance Use Topics  . Alcohol use: No  . Drug use: No     Allergies   Patient has no known allergies.   Review of Systems Review of Systems  All other systems reviewed and are negative.    Physical Exam Updated Vital Signs BP 131/85 (BP Location: Right Arm)   Pulse 60   Temp 97.8 F (36.6 C) (Oral)   Resp 16   Ht 6' (1.829 m)   Wt 63.5 kg (140 lb)   SpO2 100%   BMI 18.99 kg/m   Physical Exam  Constitutional: He is oriented to person, place, and time. He appears well-developed and well-nourished.  Non-toxic appearance. No distress.  Disheveled appearance  HENT:  Head: Normocephalic and atraumatic.  Mouth/Throat: Oropharynx is clear and moist.  Eyes: Pupils are equal, round,  and reactive to light. Conjunctivae are normal. Right eye exhibits no discharge. Left eye exhibits no discharge.  Neck: Normal range of motion. Neck supple. No neck rigidity.  No c spine midline tenderness. No paraspinal tenderness. No deformities or step offs noted. Full ROM. Supple. No nuchal rigidity.    Cardiovascular: Normal rate, regular rhythm, normal heart sounds and intact distal pulses. Exam reveals no gallop and no friction rub.  No murmur heard. Pulmonary/Chest: Effort normal and breath sounds normal. No respiratory distress. He exhibits  no tenderness.  Abdominal: Soft. Bowel sounds are normal. He exhibits no distension. There is no tenderness. There is no rebound and no guarding.  Musculoskeletal: Normal range of motion. He exhibits no tenderness.  Lymphadenopathy:    He has no cervical adenopathy.  Neurological: He is alert and oriented to person, place, and time.  The patient is alert, attentive, and oriented x 3. Speech is clear. Cranial nerve II-VII grossly intact. Negative pronator drift. Sensation intact. Strength 5/5 in all extremities. Reflexes 2+ and symmetric at biceps, triceps, knees, and ankles. Rapid alternating movement and fine finger movements intact. Romberg is absent. Posture and gait normal.   Skin: Skin is warm and dry. Capillary refill takes less than 2 seconds. No rash noted.  Psychiatric: Judgment normal. His speech is delayed. He is slowed. Thought content is delusional. He exhibits a depressed mood.  Rambling speech; difficult to engage in conversation; no HI or SI.  Patient appears delusional with flight of thoughts.  Nursing note and vitals reviewed.    ED Treatments / Results  Labs (all labs ordered are listed, but only abnormal results are displayed) Labs Reviewed  COMPREHENSIVE METABOLIC PANEL - Abnormal; Notable for the following components:      Result Value   Glucose, Bld 111 (*)    All other components within normal limits  CBC WITH DIFFERENTIAL/PLATELET - Abnormal; Notable for the following components:   Hemoglobin 12.3 (*)    HCT 36.6 (*)    All other components within normal limits  ETHANOL  RAPID URINE DRUG SCREEN, HOSP PERFORMED    EKG None  Radiology No results found.  Procedures Procedures (including critical care time)  Medications Ordered in ED Medications  acetaminophen (TYLENOL) tablet 650 mg (650 mg Oral Given 03/06/18 2321)  ibuprofen (ADVIL,MOTRIN) tablet 600 mg (600 mg Oral Given 03/07/18 0053)     Initial Impression / Assessment and Plan / ED Course  I  have reviewed the triage vital signs and the nursing notes.  Pertinent labs & imaging results that were available during my care of the patient were reviewed by me and considered in my medical decision making (see chart for details).     Patient with known schizophrenia not currently taking medications along with polysubstance abuse presents to the emergency department today for evaluation of headache.  Of note patient is well-known to the ED with 23 visits in the past 6 months.  Patient states this started after being exposed to a "poison".  Patient appears to be paranoid with hallucinations.  Hard to redirect.  Flight of ideas.  Patient speech is delayed and slowed.  Patient has no focal neuro deficit.  Answers questions appropriately.  Vital signs reassuring.  Heart regular rate and rhythm.  Lungs clear to auscultation bilaterally.  Labs reassuring.  No leukocytosis.  Patient has no meningeal signs.  Patient's headache treated with Motrin and Tylenol.  Given patient's affect and what appears to be acute delusions and responding to internal stimuli  will consult TTS for further evaluation.  Patient is agreeable TTS evaluation.  Psychiatric hold orders were placed.  Clinical presentation does not seem consistent meningitis, ICH, SAH, temporal arteritis, pseudotumor cerebri.  On reassessment patient is sleeping in the room.  Vital signs remained reassuring.  Patient is medically cleared for TTS evaluation and disposition.  TTS evaluated patient and patient does not meet inpatient criteria.  They recommend outpatient follow-up.  Patient's headache has completely resolved.  He is sleeping in the room on reassessment.  Will discharge with outpatient follow-up and recommendations for symptom Medicare at home.  Discussed return precautions with patient as needed.  Pt is hemodynamically stable, in NAD, & able to ambulate in the ED. Evaluation does not show pathology that would require ongoing emergent  intervention or inpatient treatment. I explained the diagnosis to the patient. Pain has been managed & has no complaints prior to dc. Pt is comfortable with above plan and is stable for discharge at this time. All questions were answered prior to disposition. Strict return precautions for f/u to the ED were discussed. Encouraged follow up with PCP.   Final Clinical Impressions(s) / ED Diagnoses   Final diagnoses:  Nonintractable headache, unspecified chronicity pattern, unspecified headache type  Paranoia [facility])    ED Discharge Orders    None       Rise Mu, PA-C 03/07/18 0127    Rise Mu, PA-C 03/07/18 0214    Charlynne Pander, MD 03/07/18 [phone]

## 2018-03-06 NOTE — ED Triage Notes (Signed)
Pt arrives ambulatory to triage with c/o headache that started around 2pm today. Frontal and bilateral temple pain. No meds PTA.

## 2018-03-07 LAB — COMPREHENSIVE METABOLIC PANEL
ALT: 11 U/L (ref 0–44)
AST: 19 U/L (ref 15–41)
Albumin: 4.1 g/dL (ref 3.5–5.0)
Alkaline Phosphatase: 66 U/L (ref 38–126)
Anion gap: 8 (ref 5–15)
BILIRUBIN TOTAL: 0.7 mg/dL (ref 0.3–1.2)
BUN: 12 mg/dL (ref 6–20)
CO2: 28 mmol/L (ref 22–32)
CREATININE: 1 mg/dL (ref 0.61–1.24)
Calcium: 9.6 mg/dL (ref 8.9–10.3)
Chloride: 107 mmol/L (ref 98–111)
Glucose, Bld: 111 mg/dL — ABNORMAL HIGH (ref 70–99)
Potassium: 3.5 mmol/L (ref 3.5–5.1)
Sodium: 143 mmol/L (ref 135–145)
Total Protein: 7.5 g/dL (ref 6.5–8.1)

## 2018-03-07 LAB — CBC WITH DIFFERENTIAL/PLATELET
BASOS PCT: 0 %
Basophils Absolute: 0 10*3/uL (ref 0.0–0.1)
EOS ABS: 0 10*3/uL (ref 0.0–0.7)
Eosinophils Relative: 1 %
HCT: 36.6 % — ABNORMAL LOW (ref 39.0–52.0)
Hemoglobin: 12.3 g/dL — ABNORMAL LOW (ref 13.0–17.0)
LYMPHS ABS: 2.1 10*3/uL (ref 0.7–4.0)
Lymphocytes Relative: 37 %
MCH: 28.7 pg (ref 26.0–34.0)
MCHC: 33.6 g/dL (ref 30.0–36.0)
MCV: 85.3 fL (ref 78.0–100.0)
MONO ABS: 0.5 10*3/uL (ref 0.1–1.0)
MONOS PCT: 8 %
Neutro Abs: 3.1 10*3/uL (ref 1.7–7.7)
Neutrophils Relative %: 54 %
Platelets: 160 10*3/uL (ref 150–400)
RBC: 4.29 MIL/uL (ref 4.22–5.81)
RDW: 12.8 % (ref 11.5–15.5)
WBC: 5.7 10*3/uL (ref 4.0–10.5)

## 2018-03-07 LAB — RAPID URINE DRUG SCREEN, HOSP PERFORMED
Amphetamines: NOT DETECTED
Barbiturates: NOT DETECTED
Benzodiazepines: NOT DETECTED
Cocaine: NOT DETECTED
OPIATES: NOT DETECTED
Tetrahydrocannabinol: NOT DETECTED

## 2018-03-07 LAB — ETHANOL

## 2018-03-07 MED ORDER — IBUPROFEN 200 MG PO TABS
600.0000 mg | ORAL_TABLET | Freq: Once | ORAL | Status: AC
  Administered 2018-03-07: 600 mg via ORAL
  Filled 2018-03-07: qty 3

## 2018-03-07 NOTE — Progress Notes (Signed)
Pt does not meet criteria for inpt treatment per Donell SievertSpencer Simon, PA. EDP Cruzita LedererLeaphart, Lynann BeaverKenneth T, PA-C has been advised. Ilene, RN has also been advised of the recommendation. [facility].   Princess BruinsAquicha Jamarquis Crull, MSW, LCSW Therapeutic Triage Specialist  [phone]

## 2018-03-07 NOTE — Discharge Instructions (Signed)
Drink plenty of fluids at home. This will help with your headache.  °Please follow up with pcp or neurologist.  ° °Fortunately, your evaluation today is reassuring with no apparent emergent cause for your headache at this time. With that being said, it is VERY important that you monitor your symptoms at home. If you develop worsening headache, new fever, new neck stiffness, rash, weakness, numbness, trouble with your speech, trouble walking, new or worsening symptoms or any concerning symptoms, please return to the ED immediately.  ° °

## 2018-03-07 NOTE — BH Assessment (Addendum)
Assessment Note  Collin White is an 22 y.o. male who presents to the ED voluntarily. Pt reports he came to the ED because he has a headache. Pt denies SI, HI and AVH. Per chart review, pt reported to the EDP that he believes he was poisoned by [employer] representative workers. Pt has multiple ED visits in the past 6 months c/o delusions and schizophrenia. Per chart, pt has 23 ED visits in the past 6 months. Pt states he is followed by [facility] for OPT needs. Pt denies any other complaints aside from having a headache. Pt states lives alone in his own home. Pt appears disheveled with poor hygiene. Pt's speech is slurred and at times he is difficult to comprehend. Pt appears drowsy as he continues falling asleep during the assessment and only opens his eyes to answer some of the questions he is asked. Pt states he has no concerns for his safety and tells this Clinical research associate he is able to keep himself safe if d/c.   Pt does not meet criteria for inpt treatment per Donell Sievert, PA. EDP Cruzita Lederer Lynann Beaver, PA-C has been advised. Ilene, RN has also been advised of the recommendation. Pt is instructed to follow up with [facility] for OPT needs.   Diagnosis: Schizophrenia   Past Medical History:  Past Medical History:  Diagnosis Date  . Migraines   . Psychoses (HCC)   . Schizophrenia (HCC)   . Seizures (HCC)     Past Surgical History:  Procedure Laterality Date  . abd surgery s/p traumatic event    . facial reconstructive surgery    . NO PAST SURGERIES  patient stated he had facial reconstruction surgery as a child   . tumor removed from head       Family History:  Family History  Problem Relation Age of Onset  . Hypertension Other   . Mental illness Neg Hx     Social History:  reports that he has quit smoking. His smoking use included cigarettes. He has never used smokeless tobacco. He reports that he does not drink alcohol or use drugs.  Additional Social History:  Alcohol / Drug Use Pain Medications: See  MAR Prescriptions: See MAR Over the Counter: See MAR History of alcohol / drug use?: No history of alcohol / drug abuse  CIWA: CIWA-Ar BP: (!) 137/58 Pulse Rate: (!) 52 COWS:    Allergies: No Known Allergies  Home Medications:  (Not in a hospital admission)  OB/GYN Status:  No LMP for male patient.  General Assessment Data Location of Assessment: [facility] ED TTS Assessment: In system Is this a Tele or Face-to-Face Assessment?: Face-to-Face Is this an Initial Assessment or a Re-assessment for this encounter?: Initial Assessment Marital status: Single Is patient pregnant?: No Pregnancy Status: No Living Arrangements: Alone Can pt return to current living arrangement?: Yes Admission Status: Voluntary Is patient capable of signing voluntary admission?: Yes Referral Source: Self/Family/Friend Insurance type: Medicaid     Crisis Care Plan Living Arrangements: Alone Name of Psychiatrist: Transport planner Name of Therapist: [name]  Education Status Is patient currently in school?: No Is the patient employed, unemployed or receiving disability?: Unemployed  Risk to self with the past 6 months Suicidal Ideation: No Has patient been a risk to self within the past 6 months prior to admission? : No Suicidal Intent: No Has patient had any suicidal intent within the past 6 months prior to admission? : No Is patient at risk for suicide?: No Suicidal Plan?: No Has patient had any  suicidal plan within the past 6 months prior to admission? : No Access to Means: No What has been your use of drugs/alcohol within the last 12 months?: denies use Previous Attempts/Gestures: No Triggers for Past Attempts: None known Intentional Self Injurious Behavior: None Family Suicide History: No Recent stressful life event(s): Other (Comment)(believes he has been poisoned) Persecutory voices/beliefs?: No Depression: No Substance abuse history and/or treatment for substance abuse?: No Suicide prevention  information given to non-admitted patients: Not applicable  Risk to Others within the past 6 months Homicidal Ideation: No Does patient have any lifetime risk of violence toward others beyond the six months prior to admission? : No Thoughts of Harm to Others: No Current Homicidal Intent: No Current Homicidal Plan: No Access to Homicidal Means: No History of harm to others?: No Assessment of Violence: None Noted Does patient have access to weapons?: No Criminal Charges Pending?: No Does patient have a court date: No Is patient on probation?: No  Psychosis Hallucinations: None noted Delusions: Unspecified  Mental Status Report Appearance/Hygiene: Body odor, Poor hygiene Eye Contact: Poor Motor Activity: Freedom of movement Speech: Soft, Slow Level of Consciousness: Drowsy Mood: Labile Affect: Flat Anxiety Level: None Thought Processes: Flight of Ideas Judgement: Impaired Orientation: Person, Place, Time Obsessive Compulsive Thoughts/Behaviors: None  Cognitive Functioning Concentration: Normal Memory: Remote Intact, Recent Intact Is patient IDD: No Is patient DD?: No Insight: Fair Impulse Control: Fair Appetite: Good Have you had any weight changes? : No Change Sleep: No Change Total Hours of Sleep: 8 Vegetative Symptoms: None  ADLScreening [name] Patient's cognitive ability adequate to safely complete daily activities?: Yes Patient able to express need for assistance with ADLs?: Yes Independently performs ADLs?: Yes (appropriate for developmental age)  Prior Inpatient Therapy Prior Inpatient Therapy: Yes Prior Therapy Dates: 2019, 2018 and mult other admissions  [facility] Reason for Treatment: Paranoid schizophrenia   Prior Outpatient Therapy Prior Outpatient Therapy: Yes Prior Therapy Dates: CURRENT Prior Therapy Facilty/Provider(s): [name] Reason for Treatment: MED MANAGEMENT Does patient have an ACCT  team?: No Does patient have Intensive In-House Services?  : No Does patient have [name] services? : Yes Does patient have P4CC services?: No  ADL Screening (condition at time of admission) Patient's cognitive ability adequate to safely complete daily activities?: Yes Is the patient deaf or have difficulty hearing?: No Does the patient have difficulty seeing, even when wearing glasses/contacts?: No Does the patient have difficulty concentrating, remembering, or making decisions?: No Patient able to express need for assistance with ADLs?: Yes Does the patient have difficulty dressing or bathing?: No Independently performs ADLs?: Yes (appropriate for developmental age) Does the patient have difficulty walking or climbing stairs?: No Weakness of Legs: None Weakness of Arms/Hands: None  Home Assistive Devices/Equipment Home Assistive Devices/Equipment: None    Abuse/Neglect Assessment (Assessment to be complete while patient is alone) Abuse/Neglect Assessment Can Be Completed: Yes Physical Abuse: Denies Verbal Abuse: Denies Sexual Abuse: Denies Exploitation of patient/patient's resources: Denies Self-Neglect: Denies     Merchant navy officerAdvance Directives (For Healthcare) Does Patient Have a Medical Advance Directive?: No Would patient like information on creating a medical advance directive?: No - Patient declined    Additional Information 1:1 In Past 12 Months?: No CIRT Risk: No Elopement Risk: No Does patient have medical clearance?: Yes     Disposition: Pt does not meet criteria for inpt treatment per Donell SievertSpencer Simon, PA. EDP Cruzita LedererLeaphart, Lynann BeaverKenneth T, PA-C has been advised. Ilene, RN has also been advised of the recommendation.  Pt is instructed to follow up with [facility] for OPT needs.   Disposition Initial Assessment Completed for this Encounter: Yes Disposition of Patient: Discharge(PER SPENCER SIMON, PA) Patient refused recommended treatment: No Mode of transportation if patient is  discharged?: Walking  On Site Evaluation by:   Reviewed with Physician:    Karolee Ohs 03/07/2018 2:20 AM

## 2018-03-12 ENCOUNTER — Emergency Department (HOSPITAL_COMMUNITY)
Admission: EM | Admit: 2018-03-12 | Discharge: 2018-03-12 | Disposition: A | Discharge status: Home / Self Care | Payer: Self-pay | Attending: Emergency Medicine | Admitting: Emergency Medicine

## 2018-03-12 ENCOUNTER — Encounter (HOSPITAL_COMMUNITY): Payer: Self-pay | Admitting: Emergency Medicine

## 2018-03-12 ENCOUNTER — Other Ambulatory Visit: Payer: Self-pay

## 2018-03-12 DIAGNOSIS — Z87891 Personal history of nicotine dependence: Secondary | ICD-10-CM | POA: Insufficient documentation

## 2018-03-12 DIAGNOSIS — L659 Nonscarring hair loss, unspecified: Secondary | ICD-10-CM | POA: Insufficient documentation

## 2018-03-12 DIAGNOSIS — R110 Nausea: Secondary | ICD-10-CM | POA: Insufficient documentation

## 2018-03-12 DIAGNOSIS — R51 Headache: Secondary | ICD-10-CM | POA: Insufficient documentation

## 2018-03-12 MED ORDER — ONDANSETRON 4 MG PO TBDP
4.0000 mg | ORAL_TABLET | Freq: Once | ORAL | Status: AC
  Administered 2018-03-12: 4 mg via ORAL
  Filled 2018-03-12: qty 1

## 2018-03-12 MED ORDER — DICLOFENAC SODIUM 50 MG PO TBEC
50.0000 mg | DELAYED_RELEASE_TABLET | Freq: Once | ORAL | Status: AC
  Administered 2018-03-12: 50 mg via ORAL
  Filled 2018-03-12 (×2): qty 1

## 2018-03-12 NOTE — ED Provider Notes (Signed)
[facility]-EMERGENCY DEPT Provider Note   CSN: [id number] Arrival date & time: 03/12/18  2043     History   Chief Complaint Chief Complaint  Patient presents with  . Headache  . Nausea    HPI Collin White is a 22 y.o. male.  The history is provided by the patient. No language interpreter was used.  Headache   This is a new problem. The problem occurs constantly. The problem has not changed since onset.The headache is associated with nothing. The patient is experiencing no pain. The pain does not radiate. Associated symptoms include nausea. Pertinent negatives include no vomiting. He has tried nothing for the symptoms. The treatment provided no relief.  Pt complains of nausea.  Pt reports he is nauseated when he drinks coffee or tea.  Pt reports he has a lump on his head. Pt reports he is planning to move to [place].  Past Medical History:  Diagnosis Date  . Migraines   . Psychoses (HCC)   . Schizophrenia (HCC)   . Seizures [facility])     Patient Active Problem List   Diagnosis Date Noted  . Acute episode of schizophrenia with history of multiple episodes (HCC) 06/30/2017  . Schizophrenia, paranoid type (HCC) 06/08/2017  . 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 Psychoses (HCC)   . Paranoid schizophrenia (HCC) 09/29/2015  . Cannabis use disorder, moderate, in early remission (HCC) 09/26/2015  . History of posttraumatic stress disorder (PTSD) 09/26/2015    Past Surgical History:  Procedure Laterality Date  . abd surgery s/p traumatic event    . facial reconstructive surgery    . NO PAST SURGERIES  patient stated he had facial reconstruction surgery as a child   . tumor removed from head           Home Medications    Prior to Admission medications   Not on File    Family History Family History  Problem Relation Age of Onset  . Hypertension Other   . Mental illness Neg Hx     Social History Social History   Tobacco Use    . Smoking status: Former Smoker    Types: Cigarettes  . Smokeless tobacco: Never Used  Substance Use Topics  . Alcohol use: No  . Drug use: No     Allergies   Patient has no known allergies.   Review of Systems Review of Systems  Gastrointestinal: Positive for nausea. Negative for vomiting.  Neurological: Positive for headaches.  All other systems reviewed and are negative.    Physical Exam Updated Vital Signs BP 130/90 (BP Location: Left Arm)   Pulse 75   Temp 98.7 F (37.1 C) (Oral)   Resp 14   Ht 6' (1.829 m)   Wt 63.5 kg (140 lb)   SpO2 100%   BMI 18.99 kg/m   Physical Exam  Constitutional: He appears well-developed and well-nourished.  HENT:  Head: Normocephalic and atraumatic.  Small area of hair loss  No mass on scalp  Eyes: Conjunctivae are normal.  Neck: Neck supple.  Cardiovascular: Normal rate and regular rhythm.  No murmur heard. Pulmonary/Chest: Effort normal and breath sounds normal. No respiratory distress.  Abdominal: Soft. There is no tenderness.  Musculoskeletal: He exhibits no edema.  Neurological: He is alert. He has normal strength.  Skin: Skin is warm and dry.  Psychiatric: He has a normal mood and affect.  Nursing note and vitals reviewed.    ED Treatments / Results  Labs (all labs  ordered are listed, but only abnormal results are displayed) Labs Reviewed - No data to display  EKG None  Radiology No results found.  Procedures Procedures (including critical care time)  Medications Ordered in ED Medications - No data to display   Initial Impression / Assessment and Plan / ED Course  I have reviewed the triage vital signs and the nursing notes.  Pertinent labs & imaging results that were available during my care of the patient were reviewed by me and considered in my medical decision making (see chart for details).     Pt is seen in ED frequently.  Pt has a history of schizophrenia.  Pt seems to be at his baseline.     Final Clinical Impressions(s) / ED Diagnoses   Final diagnoses:  Nausea    ED Discharge Orders    None       Osie CheeksSofia, Keirston Saephanh K, PA-C 03/12/18 2210    Raeford RazorKohut, Stephen, MD 03/14/18 [phone]

## 2018-03-12 NOTE — ED Triage Notes (Signed)
Patient is complaining of the back of his head is hurting. Patient is also complaining of nausea in his mouth.

## 2018-03-12 NOTE — Discharge Instructions (Addendum)
Avoid foods that cause nausea.

## 2018-03-13 DIAGNOSIS — R110 Nausea: Secondary | ICD-10-CM | POA: Insufficient documentation

## 2018-03-13 DIAGNOSIS — Z87891 Personal history of nicotine dependence: Secondary | ICD-10-CM | POA: Insufficient documentation

## 2018-03-13 DIAGNOSIS — R51 Headache: Secondary | ICD-10-CM | POA: Insufficient documentation

## 2018-03-13 DIAGNOSIS — F200 Paranoid schizophrenia: Secondary | ICD-10-CM | POA: Insufficient documentation

## 2018-03-14 ENCOUNTER — Other Ambulatory Visit: Payer: Self-pay

## 2018-03-14 ENCOUNTER — Encounter (HOSPITAL_COMMUNITY): Payer: Self-pay | Admitting: Nurse Practitioner

## 2018-03-14 ENCOUNTER — Emergency Department (HOSPITAL_COMMUNITY)
Admission: EM | Admit: 2018-03-14 | Discharge: 2018-03-15 | Disposition: A | Discharge status: Home / Self Care | Payer: Medicaid Other | Attending: Emergency Medicine | Admitting: Emergency Medicine

## 2018-03-14 ENCOUNTER — Emergency Department (HOSPITAL_COMMUNITY)
Admission: EM | Admit: 2018-03-14 | Discharge: 2018-03-14 | Disposition: A | Discharge status: Home / Self Care | Payer: Medicaid Other | Attending: Emergency Medicine | Admitting: Emergency Medicine

## 2018-03-14 DIAGNOSIS — R519 Headache, unspecified: Secondary | ICD-10-CM

## 2018-03-14 DIAGNOSIS — F209 Schizophrenia, unspecified: Secondary | ICD-10-CM | POA: Insufficient documentation

## 2018-03-14 DIAGNOSIS — Z87891 Personal history of nicotine dependence: Secondary | ICD-10-CM | POA: Insufficient documentation

## 2018-03-14 DIAGNOSIS — R51 Headache: Secondary | ICD-10-CM

## 2018-03-14 DIAGNOSIS — R070 Pain in throat: Secondary | ICD-10-CM | POA: Insufficient documentation

## 2018-03-14 DIAGNOSIS — R110 Nausea: Secondary | ICD-10-CM

## 2018-03-14 MED ORDER — ONDANSETRON 4 MG PO TBDP
4.0000 mg | ORAL_TABLET | Freq: Once | ORAL | Status: AC
  Administered 2018-03-14: 4 mg via ORAL
  Filled 2018-03-14: qty 1

## 2018-03-14 MED ORDER — ACETAMINOPHEN 325 MG PO TABS
650.0000 mg | ORAL_TABLET | Freq: Once | ORAL | Status: AC
Start: 2018-03-14 — End: 2018-03-14
  Administered 2018-03-14: 650 mg via ORAL
  Filled 2018-03-14: qty 2

## 2018-03-14 NOTE — ED Triage Notes (Signed)
Pt states he is having a throat problem, started about an hour ago when he ate a cheeseburger from [employer].

## 2018-03-14 NOTE — ED Triage Notes (Signed)
Pt is c/o a head ache and nause.

## 2018-03-14 NOTE — ED Provider Notes (Signed)
[facility]-EMERGENCY DEPT Provider Note   CSN: [id number] Arrival date & time: 03/13/18  1936     History   Chief Complaint Chief Complaint  Patient presents with  . Headache    HPI Collin White is a 22 y.o. male with a history of migraines, schizophrenia, seizures and psychosis, presenting for head pain and nausea that began yesterday morning.  Patient states that he was walking when he felt like he had a spot on the back of his head that began burning.  Patient states that this burning area moves all around his head and is 5/10 in severity.  Patient denies having taking any medication for this pain.  Patient denies photophobia or phonophobia.  Patient states that he feels like he has a bump on the back of his head.  Patient states that nothing makes his pain worse and that it is constant. Patient also endorses nausea that began at the same time that he noticed a spot on his head.  Patient states that he has been eating since he began having the nausea however he states that he feels like he wants to vomit occasionally.  Patient denies history of fall or injury to the area, he also denies fever, vomiting, diarrhea, abdominal pain, chest pain or shortness of breath, seizure.  Patient presented to this ER approximately 10.5 hours prior to my evaluation, I have been informed that he has been sleeping in the waiting room since arrival.  Upon my initial evaluation the he was sleeping comfortably in the room.  Patient easily arousable, answering questions appropriately, no slurred speech at this time.  Patient denying drug or alcohol use, history of fever, or any other problems today.    HPI  Past Medical History:  Diagnosis Date  . Migraines   . Psychoses (HCC)   . Schizophrenia (HCC)   . Seizures [facility])     Patient Active Problem List   Diagnosis Date Noted  . Acute episode of schizophrenia with history of multiple episodes (HCC) 06/30/2017  . Schizophrenia,  paranoid type (HCC) 06/08/2017  . 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 Psychoses (HCC)   . Paranoid schizophrenia (HCC) 09/29/2015  . Cannabis use disorder, moderate, in early remission (HCC) 09/26/2015  . History of posttraumatic stress disorder (PTSD) 09/26/2015    Past Surgical History:  Procedure Laterality Date  . abd surgery s/p traumatic event    . facial reconstructive surgery    . NO PAST SURGERIES  patient stated he had facial reconstruction surgery as a child   . tumor removed from head           Home Medications    Prior to Admission medications   Not on File    Family History Family History  Problem Relation Age of Onset  . Hypertension Other   . Mental illness Neg Hx     Social History Social History   Tobacco Use  . Smoking status: Former Smoker    Types: Cigarettes  . Smokeless tobacco: Never Used  Substance Use Topics  . Alcohol use: No  . Drug use: No     Allergies   Patient has no known allergies.   Review of Systems Review of Systems  Constitutional: Negative.  Negative for chills, fatigue and fever.  HENT: Negative.  Negative for congestion, rhinorrhea, sore throat and trouble swallowing.   Eyes: Negative.  Negative for photophobia and visual disturbance.  Respiratory: Negative.  Negative for cough and shortness of breath.  Cardiovascular: Negative.  Negative for chest pain.  Gastrointestinal: Positive for nausea. Negative for abdominal pain, blood in stool, diarrhea and vomiting.  Genitourinary: Negative.  Negative for difficulty urinating, dysuria and hematuria.  Musculoskeletal: Negative.  Negative for arthralgias, neck pain and neck stiffness.  Skin: Negative.  Negative for rash and wound.  Neurological: Positive for headaches. Negative for dizziness, seizures, syncope, weakness and light-headedness.    Physical Exam Updated Vital Signs BP 113/85 (BP Location: Left Arm)   Pulse 80   Temp 98 F (36.7 C)   Resp  16   SpO2 100%   Physical Exam  Constitutional: He is oriented to person, place, and time. He appears well-developed and well-nourished. He does not appear ill. No distress.  HENT:  Head: Normocephalic and atraumatic. Head is without raccoon's eyes, without Battle's sign, without contusion and without laceration.    Right Ear: Hearing and external ear normal.  Left Ear: Hearing and external ear normal.  Nose: Nose normal.  Mouth/Throat: Uvula is midline, oropharynx is clear and moist and mucous membranes are normal.  Patient with pain to his occipital scalp.  There are no signs of injury.  Skin is intact, no erythema, swelling, fluctuance, increased warmth or drainage to the area; no sign of infection.   The patient's hair is parted in this area from him rubbing the area.  Eyes: Pupils are equal, round, and reactive to light. EOM are normal.  Neck: Trachea normal, normal range of motion, full passive range of motion without pain and phonation normal. Neck supple. No tracheal tenderness and no muscular tenderness present. No neck rigidity. No tracheal deviation present. No Brudzinski's sign and no Kernig's sign noted.  Cardiovascular: Normal rate, regular rhythm, normal heart sounds and intact distal pulses.  Pulmonary/Chest: Effort normal and breath sounds normal. No respiratory distress. He has no wheezes.  Abdominal: Soft. Bowel sounds are normal. There is no tenderness. There is no guarding.  Musculoskeletal: Normal range of motion.  Neurological: He is alert and oriented to person, place, and time. He has normal strength. He is not disoriented.  Mental Status:  Alert, oriented, thought content appropriate, able to give a coherent history. Speech fluent without evidence of aphasia. Able to follow 2 step commands without difficulty.  Cranial Nerves:  II:  Peripheral visual fields grossly normal, pupils equal, round, reactive to light III,IV, VI: ptosis not present, extra-ocular motions  intact bilaterally  V,VII: smile symmetric, eyebrows raise symmetric, facial light touch sensation equal VIII: hearing grossly normal to voice  X: uvula elevates symmetrically  XI: bilateral shoulder shrug symmetric and strong XII: midline tongue extension without fassiculations Motor:  Normal tone. 5/5 in upper and lower extremities bilaterally including strong and equal grip strength and dorsiflexion/plantar flexion Sensory: Sensation intact to light touch in all extremities.  Gait: normal gait and balance CV: distal pulses palpable throughout   Skin: Skin is warm and dry. Capillary refill takes less than 2 seconds.  Psychiatric: He has a normal mood and affect. His behavior is normal.     ED Treatments / Results  Labs (all labs ordered are listed, but only abnormal results are displayed) Labs Reviewed - No data to display  EKG None  Radiology No results found.  Procedures Procedures (including critical care time)  Medications Ordered in ED Medications  ondansetron (ZOFRAN-ODT) disintegrating tablet 4 mg (4 mg Oral Given 03/14/18 0648)  acetaminophen (TYLENOL) tablet 650 mg (650 mg Oral Given 03/14/18 0647)     Initial Impression /  Assessment and Plan / ED Course  I have reviewed the triage vital signs and the nursing notes.  Pertinent labs & imaging results that were available during my care of the patient were reviewed by me and considered in my medical decision making (see chart for details).  Clinical Course as of Mar 14 737  Sun Mar 14, 2018  1610 I have been informed by nursing staff that patient has eaten a sandwich and drink a soda in the room without complaint of nausea or vomiting.   [BM]  0723 Evaluation patient is sleeping comfortably in room, easily arousable, answers questions appropriately.  He states that he is feeling somewhat better at this time and is requesting discharge.   [BM]    Clinical Course User Index [BM] Bill Salinas, PA-C    Patient has been resting comfortably in the ER for close to 11 hours at time of my initial evaluation.  No acute distress.  Complaining of a bump in his head, no signs of injury or bump present, denies history of injury.  Patient also states that he has nausea but has been without vomiting.  Plan at this time is to treat patient's pain with Tylenol, nausea with Zofran and offer the patient breakfast.  Pending resolution of symptoms plan to discharge patient with return precautions.  I have discussed this patient with Dr. Read Drivers who agrees with plan.   Patient denies neurological symptoms such as vision changes, focal numbness/weakness, confusion, balance problems, speech difficulty patient is nontoxic and well-appearing.  Patient is afebrile, denies neck pain or nuchal rigidity.  Patient is nontoxic, well-appearing.  Reassuring neuro exam, no cranial deficits, no speech deficits, normal and equal strength bilaterally.  I have encouraged the patient to follow-up with the [facility].  On reevaluation patient is sleeping comfortably in the room, no acute distress.  Patient states that pain and nausea have somewhat improved.  Patient has eaten a [place] sandwich and drinking soda without vomiting.  I have discussed plan of care with patient who is agreeable to discharge at this time.  Patient is ambulatory at discharge.  At this time there does not appear to be any evidence of an acute emergency medical condition and the patient appears stable for discharge with appropriate outpatient follow up. Diagnosis was discussed with patient who verbalizes understanding and is agreeable to discharge. I have discussed return precautions with patient who verbalizes understanding of return precautions. Patient strongly encouraged to follow-up with their PCP.  Patient's case discussed with Dr. Read Drivers who agrees with plan to discharge with follow-up.     This note was dictated using DragonOne dictation  software; please contact for any inconsistencies within the note.  Final Clinical Impressions(s) / ED Diagnoses   Final diagnoses:  Nonintractable headache, unspecified chronicity pattern, unspecified headache type  Nausea without vomiting    ED Discharge Orders    None       Elizabeth Palau 03/14/18 1556    Molpus, John, MD 03/14/18 2238

## 2018-03-14 NOTE — ED Notes (Signed)
Pt has no complaints of N/V after food was given.

## 2018-03-14 NOTE — ED Notes (Signed)
Pt was given a Sprite and [place] sandwich.

## 2018-03-14 NOTE — Discharge Instructions (Addendum)
Please follow-up with your primary care provider regarding your visit today.  If you do not have a primary care provider please call the number to [facility] to establish one. Please return to the emergency department for any new or worsening symptoms.  Contact a doctor if: Your symptoms are not helped by medicine. You have a headache that feels different than the other headaches. You feel sick to your stomach (nauseous) or you throw up (vomit). You have a fever. Get help right away if: Your headache becomes really bad. You keep throwing up. You have a stiff neck. You have trouble seeing. You have trouble speaking. You have pain in the eye or ear. Your muscles are weak or you lose muscle control. You lose your balance or have trouble walking. You feel like you will pass out (faint) or you pass out. You have confusion. Contact a health care provider if: You have a headache. You have new symptoms. Your nausea gets worse. You have a fever. You feel light-headed or dizzy. You vomit. You cannot keep fluids down. Get help right away if: You have pain in your chest, neck, arm, or jaw. You feel extremely weak or you faint. You have vomit that is bright red or looks like coffee grounds. You have bloody or black stools or stools that look like tar. You have a severe headache, a stiff neck, or both. You have severe pain, cramping, or bloating in your abdomen. You have a rash. You have difficulty breathing or are breathing very quickly. Your heart is beating very quickly. Your skin feels cold and clammy. You feel confused. You have pain when you urinate. You have signs of dehydration, such as: Dark urine, very little, or no urine. Cracked lips. Dry mouth. Sunken eyes. Sleepiness. Weakness.

## 2018-03-15 MED ORDER — ACETAMINOPHEN 325 MG PO TABS
650.0000 mg | ORAL_TABLET | Freq: Once | ORAL | Status: AC
  Administered 2018-03-15: 650 mg via ORAL
  Filled 2018-03-15: qty 2

## 2018-03-15 NOTE — ED Provider Notes (Signed)
[facility]-EMERGENCY DEPT Provider Note: [name], MD, FACEP  CSN: [id number] MRN: [id number] ARRIVAL: 03/14/18 at 2113 ROOM: WA02/WA02   CHIEF COMPLAINT  Sore Throat  Level 5 caveat: Schizophrenia with limited intellectual functioning HISTORY OF PRESENT ILLNESS  03/15/18 12:32 AM Collin White is a 22 y.o. male with schizophrenia and homelessness.  He has frequent visits to the ED for minor complaints.  He is here this morning for throat discomfort after eating a cheeseburger at [employer]. Per triage notes this was about an hour prior to arrival.  The patient tells me this was about 7 hours ago.  Pain is worse with swallowing.  He does not know if he has had a fever.    Past Medical History:  Diagnosis Date  . Migraines   . Psychoses (HCC)   . Schizophrenia (HCC)   . Seizures (HCC)     Past Surgical History:  Procedure Laterality Date  . abd surgery s/p traumatic event    . facial reconstructive surgery    . NO PAST SURGERIES  patient stated he had facial reconstruction surgery as a child   . tumor removed from head       Family History  Problem Relation Age of Onset  . Hypertension Other   . Mental illness Neg Hx     Social History   Tobacco Use  . Smoking status: Former Smoker    Types: Cigarettes  . Smokeless tobacco: Never Used  Substance Use Topics  . Alcohol use: No  . Drug use: No    Prior to Admission medications   Not on File    Allergies Patient has no known allergies.   REVIEW OF SYSTEMS     PHYSICAL EXAMINATION  Initial Vital Signs Blood pressure 112/80, pulse 72, temperature 99 F (37.2 C), temperature source Oral, resp. rate 14, SpO2 100 %.  Examination General: Well-developed, well-nourished male in no acute distress; appearance consistent with age of record HENT: normocephalic; atraumatic; no pharyngeal erythema, edema or exudate; no dysphonia; no trismus; uvula midline Eyes: pupils equal, round and reactive to light;  extraocular muscles intact Neck: supple; no lymphadenopathy Heart: regular rate and rhythm Lungs: clear to auscultation bilaterally Abdomen: soft; nondistended; nontender; no masses or hepatosplenomegaly; bowel sounds present Extremities: No deformity; full range of motion; pulses normal Neurologic: Awake, alert; motor function intact in all extremities and symmetric; no facial droop Skin: Warm and dry Psychiatric: Flat affect; slowness of speech   RESULTS  Summary of this visit's results, reviewed by myself:   EKG Interpretation  Date/Time:    Ventricular Rate:    PR Interval:    QRS Duration:   QT Interval:    QTC Calculation:   R Axis:     Text Interpretation:        Laboratory Studies: No results found for this or any previous visit (from the past 24 hour(s)). Imaging Studies: No results found.  ED COURSE and MDM  Nursing notes and initial vitals signs, including pulse oximetry, reviewed.  Vitals:   03/14/18 2151  BP: 112/80  Pulse: 72  Resp: 14  Temp: 99 F (37.2 C)  TempSrc: Oral  SpO2: 100%   Patient has unremarkable physical examination.  As noted above he visits to the ED on an almost daily basis with a variety of complaints.  PROCEDURES    ED DIAGNOSES     ICD-10-CM   1. Throat pain R07.0        Taliana Mersereau, Jonny RuizJohn, MD 03/15/18 [phone]

## 2018-03-21 ENCOUNTER — Emergency Department (HOSPITAL_COMMUNITY): Payer: Medicaid Other

## 2018-03-21 ENCOUNTER — Encounter (HOSPITAL_COMMUNITY): Payer: Self-pay | Admitting: Emergency Medicine

## 2018-03-21 ENCOUNTER — Other Ambulatory Visit: Payer: Self-pay

## 2018-03-21 ENCOUNTER — Emergency Department (HOSPITAL_COMMUNITY)
Admission: EM | Admit: 2018-03-21 | Discharge: 2018-03-21 | Disposition: A | Discharge status: Home / Self Care | Payer: Medicaid Other | Attending: Emergency Medicine | Admitting: Emergency Medicine

## 2018-03-21 DIAGNOSIS — R51 Headache: Secondary | ICD-10-CM | POA: Insufficient documentation

## 2018-03-21 DIAGNOSIS — R1013 Epigastric pain: Secondary | ICD-10-CM | POA: Insufficient documentation

## 2018-03-21 DIAGNOSIS — G8929 Other chronic pain: Secondary | ICD-10-CM

## 2018-03-21 DIAGNOSIS — Z87891 Personal history of nicotine dependence: Secondary | ICD-10-CM | POA: Insufficient documentation

## 2018-03-21 LAB — CBC
HCT: 38.5 % — ABNORMAL LOW (ref 39.0–52.0)
Hemoglobin: 13.1 g/dL (ref 13.0–17.0)
MCH: 28.7 pg (ref 26.0–34.0)
MCHC: 34 g/dL (ref 30.0–36.0)
MCV: 84.4 fL (ref 78.0–100.0)
PLATELETS: 183 10*3/uL (ref 150–400)
RBC: 4.56 MIL/uL (ref 4.22–5.81)
RDW: 13.4 % (ref 11.5–15.5)
WBC: 6.2 10*3/uL (ref 4.0–10.5)

## 2018-03-21 LAB — COMPREHENSIVE METABOLIC PANEL
ALBUMIN: 4.2 g/dL (ref 3.5–5.0)
ALT: 10 U/L (ref 0–44)
ANION GAP: 10 (ref 5–15)
AST: 20 U/L (ref 15–41)
Alkaline Phosphatase: 69 U/L (ref 38–126)
BUN: 15 mg/dL (ref 6–20)
CO2: 27 mmol/L (ref 22–32)
Calcium: 9.4 mg/dL (ref 8.9–10.3)
Chloride: 102 mmol/L (ref 98–111)
Creatinine, Ser: 1.16 mg/dL (ref 0.61–1.24)
GFR calc Af Amer: >60 mL/min (ref >60)
GFR calc non Af Amer: >60 mL/min (ref >60)
Glucose, Bld: 123 mg/dL — ABNORMAL HIGH (ref 70–99)
POTASSIUM: 3.6 mmol/L (ref 3.5–5.1)
SODIUM: 139 mmol/L (ref 135–145)
TOTAL PROTEIN: 7.2 g/dL (ref 6.5–8.1)
Total Bilirubin: 0.7 mg/dL (ref 0.3–1.2)

## 2018-03-21 LAB — LIPASE, BLOOD: Lipase: 40 U/L (ref 11–51)

## 2018-03-21 LAB — TROPONIN I: Troponin I: <0.03 ng/mL (ref <0.03)

## 2018-03-21 MED ORDER — METOCLOPRAMIDE HCL 10 MG PO TABS
10.0000 mg | ORAL_TABLET | Freq: Once | ORAL | Status: AC
  Administered 2018-03-21: 10 mg via ORAL
  Filled 2018-03-21: qty 1

## 2018-03-21 MED ORDER — GI COCKTAIL ~~LOC~~
30.0000 mL | Freq: Once | ORAL | Status: AC
  Administered 2018-03-21: 30 mL via ORAL
  Filled 2018-03-21: qty 30

## 2018-03-21 MED ORDER — OMEPRAZOLE 20 MG PO CPDR: 20.0000 mg | DELAYED_RELEASE_CAPSULE | Freq: Every day | ORAL | 0 refills | Status: DC

## 2018-03-21 MED ORDER — ONDANSETRON 4 MG PO TBDP
4.0000 mg | ORAL_TABLET | Freq: Once | ORAL | Status: AC
  Administered 2018-03-21: 4 mg via ORAL
  Filled 2018-03-21: qty 1

## 2018-03-21 MED ORDER — DIPHENHYDRAMINE HCL 25 MG PO CAPS
25.0000 mg | ORAL_CAPSULE | Freq: Once | ORAL | Status: AC
  Administered 2018-03-21: 25 mg via ORAL
  Filled 2018-03-21: qty 1

## 2018-03-21 NOTE — ED Notes (Signed)
Bed: WTR8 Expected date:  Expected time:  Means of arrival:  Comments: EVS Cleaning

## 2018-03-21 NOTE — ED Provider Notes (Signed)
[facility]-EMERGENCY DEPT Provider Note   CSN: [id number] Arrival date & time: 03/21/18  1916     History   Chief Complaint Chief Complaint  Patient presents with  . Nausea    HPI Collin White is a 22 y.o. male.  HPI 22 year old schizophrenic transgender femal here with multiple complaints.  Patient has a well-documented history of chronic headaches and his primary complaint to me is headache.  Describes it as an ache, throbbing, frontal headache.  He also reports epigastric abdominal pain that began after drinking coffee and eating.  Denies any nausea or vomiting.  No diarrhea.  No fevers or chills.  Per report, he initially reported chest pain to the PA, but on my assessment, he declines any chest pain or shortness of breath.  EKG was concerning for abnormal finding, so [facility].  Denies any new hallucinations, White, HI, or other complaints.   Past Medical History:  Diagnosis Date  . Migraines   . Psychoses (HCC)   . Schizophrenia (HCC)   . Seizures [name](HCC)     Patient Active Problem List   Diagnosis Date Noted  . Acute episode of schizophrenia with history of multiple episodes (HCC) 06/30/2017  . Schizophrenia, paranoid type (HCC) 06/08/2017  . 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 Psychoses (HCC)   . Paranoid schizophrenia (HCC) 09/29/2015  . Cannabis use disorder, moderate, in early remission (HCC) 09/26/2015  . History of posttraumatic stress disorder (PTSD) 09/26/2015    Past Surgical History:  Procedure Laterality Date  . abd surgery s/p traumatic event    . facial reconstructive surgery    . NO PAST SURGERIES  patient stated he had facial reconstruction surgery as a child   . tumor removed from head           Home Medications    Prior to Admission medications   Medication Sig Start Date End Date Taking? Authorizing Provider  omeprazole (PRILOSEC) 20 MG capsule Take 1 capsule  (20 mg total) by mouth daily for 14 days. 03/21/18 04/04/18  Shaune PollackIsaacs, Alonso Gapinski, MD    Family History Family History  Problem Relation Age of Onset  . Hypertension Other   . Mental illness Neg Hx     Social History Social History   Tobacco Use  . Smoking status: Former Smoker    Types: Cigarettes  . Smokeless tobacco: Never Used  Substance Use Topics  . Alcohol use: No  . Drug use: No     Allergies   Patient has no known allergies.   Review of Systems Review of Systems  Constitutional: Positive for fatigue. Negative for chills and fever.  HENT: Negative for congestion and rhinorrhea.   Eyes: Negative for visual disturbance.  Respiratory: Negative for cough, shortness of breath and wheezing.   Cardiovascular: Negative for chest pain and leg swelling.  Gastrointestinal: Positive for abdominal pain and nausea. Negative for diarrhea and vomiting.  Genitourinary: Negative for dysuria and flank pain.  Musculoskeletal: Negative for neck pain and neck stiffness.  Skin: Negative for rash and wound.  Allergic/Immunologic: Negative for immunocompromised state.  Neurological: Positive for headaches. Negative for syncope and weakness.  All other systems reviewed and are negative.    Physical Exam Updated Vital Signs BP 114/80   Pulse (!) 51   Temp 98.4 F (36.9 C) (Oral)   Resp 17   Ht 6' (1.829 m)   Wt 63.5 kg (140 lb)   SpO2  100%   BMI 18.99 kg/m   Physical Exam  Constitutional: He is oriented to person, place, and time. He appears well-developed and well-nourished. No distress.  HENT:  Head: Normocephalic and atraumatic.  Mouth/Throat: Oropharynx is clear and moist.  Eyes: Pupils are equal, round, and reactive to light. Conjunctivae are normal.  Neck: Neck supple.  Cardiovascular: Normal rate, regular rhythm and normal heart sounds. Exam reveals no friction rub.  No murmur heard. Pulmonary/Chest: Effort normal and breath sounds normal. No respiratory distress. He has  no wheezes. He has no rales.  Mild tenderness with palpation over anterior inferior sternum  Abdominal: Soft. Bowel sounds are normal. He exhibits no distension. There is tenderness (Epigastric, no rebound or guarding). There is no rebound and no guarding.  Negative Murphy's  Musculoskeletal: He exhibits no edema.  Neurological: He is alert and oriented to person, place, and time. He exhibits normal muscle tone.  Skin: Skin is warm. Capillary refill takes less than 2 seconds.  Psychiatric:  Flat affect.  Denies auditory hallucinations or visual hallucinations currently.  No HI or White.  Nursing note and vitals reviewed.    ED Treatments / Results  Labs (all labs ordered are listed, but only abnormal results are displayed) Labs Reviewed  CBC - Abnormal; Notable for the following components:      Result Value   HCT 38.5 (*)    All other components within normal limits  COMPREHENSIVE METABOLIC PANEL - Abnormal; Notable for the following components:   Glucose, Bld 123 (*)    All other components within normal limits  TROPONIN I  LIPASE, BLOOD    EKG EKG Interpretation  Date/Time:  Sunday March 21 2018 21:00:37 EDT Ventricular Rate:  58 PR Interval:    QRS Duration: 76 QT Interval:  382 QTC Calculation: 376 R Axis:   77 Text Interpretation:  Sinus rhythm ST elevation in inferolateral leads Since last EKG, no significant change There is persistent diffuse ST elevation without new reciprocal changes Confirmed by ,  ([id number]) on 03/21/2018 9:09:08 PM   Radiology Dg Chest 2 View  Result Date: 03/21/2018 CLINICAL DATA:  Acute onset of right-sided chest pain and nausea. EXAM: CHEST - 2 VIEW COMPARISON:  Chest radiograph performed 01/02/2018 FINDINGS: The lungs are well-aerated and clear. There is no evidence of focal opacification, pleural effusion or pneumothorax. The heart is normal in size; the mediastinal contour is within normal limits. No acute osseous abnormalities are  seen. IMPRESSION: No acute cardiopulmonary process seen. Electronically Signed   By: Jeffery  Chang M.D.   On: 03/21/2018 21:04    Procedures Procedures (including critical care time)  Medications Ordered in ED Medications  metoCLOPramide (REGLAN) tablet 10 mg (has no administration in time range)  diphenhydrAMINE (BENADRYL) capsule 25 mg (has no administration in time range)  ondansetron (ZOFRAN-ODT) disintegrating tablet 4 mg (4 mg Oral Given 03/21/18 2126)  gi cocktail (Maalox,Lidocaine,Donnatal) (30 mLs Oral Given 03/21/18 2129)     Initial Impression / Assessment and Plan / ED Course  I have reviewed the triage vital signs and the nursing notes.  Pertinent labs & imaging results that were available during my care of the patient were reviewed by me and considered in my medical decision making (see chart for details).     23  year old male well-known to this ED with chronic headache as well as nausea.  His exam is very reassuring.  There was initially concern for abnormal EKG.  On my review, he has some chronic ST elevations  diffusely, particularly in inferolateral distribution, but this is chronic and not acutely changed.  He has no new reciprocal changes.  Troponin undetectable despite constant symptoms.  Discussed with Dr. Deforest Hoyles of Cardiology who reviewed EKG, agrees the EKG findings are chronic, no intervention needed.  Otherwise, his CMP and lipase are normal.  His abdominal pain is improved with GI cocktail.  He is complaining of persistent chronic headache, but this is well-documented and he has no focal neurologic deficits.  No signs of meningitis or encephalitis.  Will discharge with continued supportive care.  Final Clinical Impressions(s) / ED Diagnoses   Final diagnoses:  Chronic nonintractable headache, unspecified headache type  Epigastric pain    ED Discharge Orders        Ordered    omeprazole (PRILOSEC) 20 MG capsule  Daily     03/21/18 2259       Shaune Pollack, MD 03/21/18 2308

## 2018-03-21 NOTE — ED Notes (Signed)
Pt transported to XR.  

## 2018-03-21 NOTE — ED Triage Notes (Signed)
Patient states he is fighting a nausea feeling. He says he cant throw up and he has tried but could not.

## 2018-03-21 NOTE — ED Provider Notes (Signed)
MSE was initiated and I personally evaluated the patient and placed orders (if any) at  9:11 PM on March 21, 2018.  The patient appears stable so that the remainder of the MSE may be completed by another provider.  Patient presents today for evaluation of multiple complaints.  Patient has a long-standing history of chronic emergency room visits for often times bizarre sounding complaints.  He presents today initially stating that he feels like he has to throw up but is unable to.  On further questioning he reports left-sided chest pain.  He is a very poor historian, secondary to psychiatric condition.  He did indicate left anterior chest pain.  EKG was ordered, which was not entirely normal.  EKG given to Dr. Erma HeritageIsaacs.  Plan for chest x-ray, troponin, patient will be moved to the back for further evaluation.   Norman ClayHammond, Meeya Goldin W, PA-C 03/21/18 2113    Shaune PollackIsaacs, Cameron, MD 03/21/18 2330

## 2018-03-21 NOTE — ED Notes (Signed)
ED Provider at bedside. 

## 2018-04-21 ENCOUNTER — Emergency Department (HOSPITAL_COMMUNITY)
Admission: EM | Admit: 2018-04-21 | Discharge: 2018-04-22 | Disposition: A | Discharge status: Home / Self Care | Payer: Self-pay | Attending: Emergency Medicine | Admitting: Emergency Medicine

## 2018-04-21 ENCOUNTER — Encounter (HOSPITAL_COMMUNITY): Payer: Self-pay | Admitting: Emergency Medicine

## 2018-04-21 ENCOUNTER — Other Ambulatory Visit: Payer: Self-pay

## 2018-04-21 DIAGNOSIS — F200 Paranoid schizophrenia: Secondary | ICD-10-CM | POA: Diagnosis present

## 2018-04-21 DIAGNOSIS — F29 Unspecified psychosis not due to a substance or known physiological condition: Secondary | ICD-10-CM

## 2018-04-21 LAB — RAPID URINE DRUG SCREEN, HOSP PERFORMED
Amphetamines: NOT DETECTED
BENZODIAZEPINES: NOT DETECTED
Barbiturates: NOT DETECTED
COCAINE: NOT DETECTED
OPIATES: NOT DETECTED
Tetrahydrocannabinol: NOT DETECTED

## 2018-04-21 LAB — COMPREHENSIVE METABOLIC PANEL
ALK PHOS: 80 U/L (ref 38–126)
ALT: 10 U/L (ref 0–44)
AST: 22 U/L (ref 15–41)
Albumin: 4.6 g/dL (ref 3.5–5.0)
Anion gap: 11 (ref 5–15)
BUN: 13 mg/dL (ref 6–20)
CALCIUM: 9.7 mg/dL (ref 8.9–10.3)
CHLORIDE: 108 mmol/L (ref 98–111)
CO2: 25 mmol/L (ref 22–32)
CREATININE: 1.08 mg/dL (ref 0.61–1.24)
Glucose, Bld: 85 mg/dL (ref 70–99)
Potassium: 3.7 mmol/L (ref 3.5–5.1)
Sodium: 144 mmol/L (ref 135–145)
Total Bilirubin: 0.6 mg/dL (ref 0.3–1.2)
Total Protein: 8.1 g/dL (ref 6.5–8.1)

## 2018-04-21 LAB — ETHANOL

## 2018-04-21 LAB — CBC
HCT: 41 % (ref 39.0–52.0)
Hemoglobin: 13.8 g/dL (ref 13.0–17.0)
MCH: 28.6 pg (ref 26.0–34.0)
MCHC: 33.7 g/dL (ref 30.0–36.0)
MCV: 85.1 fL (ref 78.0–100.0)
PLATELETS: 199 10*3/uL (ref 150–400)
RBC: 4.82 MIL/uL (ref 4.22–5.81)
RDW: 13.9 % (ref 11.5–15.5)
WBC: 5.5 10*3/uL (ref 4.0–10.5)

## 2018-04-21 MED ORDER — OLANZAPINE 5 MG PO TABS
5.0000 mg | ORAL_TABLET | Freq: Every day | ORAL | Status: DC
  Administered 2018-04-21: 5 mg via ORAL
  Filled 2018-04-21: qty 1

## 2018-04-21 NOTE — Progress Notes (Signed)
TTS consulted with Nira Conn, NP who recommends continued observation for safety and stabilization and social work consulted to assist with possible care coordination. EDP Cathren Laine, MD and pt's nurse Marylou Flesher, RN has been advised.   Princess Bruins, MSW, LCSW Therapeutic Triage Specialist  [phone]

## 2018-04-21 NOTE — ED Provider Notes (Signed)
[facility]-EMERGENCY DEPT Provider Note   CSN: [id number] Arrival date & time: 04/21/18  1747     History   Chief Complaint Chief Complaint  Patient presents with  . Blurred Vision  . patient found with pants down on side of road    HPI Collin White is a 22 y.o. male.  Patient brought by gpd/ems after being called to scene of patient standing by side of road with pants down, no underwear, acting strangely. Pt with hx schizophrenia. Pt very limited historian, poor cooperation with H and P - level 5 caveat. On entering room, patient says although born biologically male, that patients gender identity is male, and that patient is pregnant.   The history is provided by the patient and the EMS personnel. The history is limited by the condition of the patient.    Past Medical History:  Diagnosis Date  . Migraines   . Psychoses (HCC)   . Schizophrenia (HCC)   . Seizures [facility])     Patient Active Problem List   Diagnosis Date Noted  . Acute episode of schizophrenia with history of multiple episodes (HCC) 06/30/2017  . Schizophrenia, paranoid type (HCC) 06/08/2017  . 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 Psychoses (HCC)   . Paranoid schizophrenia (HCC) 09/29/2015  . Cannabis use disorder, moderate, in early remission (HCC) 09/26/2015  . History of posttraumatic stress disorder (PTSD) 09/26/2015    Past Surgical History:  Procedure Laterality Date  . abd surgery s/p traumatic event    . facial reconstructive surgery    . NO PAST SURGERIES  patient stated he had facial reconstruction surgery as a child   . tumor removed from head           Home Medications    Prior to Admission medications   Medication Sig Start Date End Date Taking? Authorizing Provider  omeprazole (PRILOSEC) 20 MG capsule Take 1 capsule (20 mg total) by mouth daily for 14 days. 03/21/18 04/04/18  Shaune Pollack, MD    Family History Family History  Problem  Relation Age of Onset  . Hypertension Other   . Mental illness Neg Hx     Social History Social History   Tobacco Use  . Smoking status: Former Smoker    Types: Cigarettes  . Smokeless tobacco: Never Used  Substance Use Topics  . Alcohol use: No  . Drug use: No     Allergies   Patient has no known allergies.   Review of Systems Review of Systems  Unable to perform ROS: Psychiatric disorder  Constitutional: Negative for fever.  Neurological:       History intermittent headache, none currently.   level 5 caveat, psychiatric illness, not compliant w ros.    Physical Exam Updated Vital Signs BP 122/90   Pulse 76   Temp 98.6 F (37 C) (Oral)   Resp 15   SpO2 100%   Physical Exam  Constitutional: He appears well-developed and well-nourished. No distress.  HENT:  Head: Atraumatic.  Mouth/Throat: Oropharynx is clear and moist.  Eyes: Pupils are equal, round, and reactive to light. Conjunctivae and EOM are normal.  Neck: Neck supple. No tracheal deviation present. No thyromegaly present.  No stiffness or rigidity.   Cardiovascular: Normal rate, regular rhythm, normal heart sounds and intact distal pulses. Exam reveals no gallop and no friction rub.  No murmur heard. Pulmonary/Chest: Effort normal and breath sounds normal. No accessory muscle usage. No respiratory distress.  Abdominal: Soft. Bowel sounds are  normal. He exhibits no distension. There is no tenderness.  Musculoskeletal: He exhibits no edema or tenderness.  Neurological: He is alert.  Speech quiet, says very few words. Moves bil extremities purposefully with good strength. Steady gait.   Skin: Skin is warm and dry. No rash noted.  Psychiatric:  Odd, flat, affect. Poorly cooperative w history.  Nursing note and vitals reviewed.    ED Treatments / Results  Labs (all labs ordered are listed, but only abnormal results are displayed) Results for orders placed or performed during the hospital encounter of  04/21/18  CBC  Result Value Ref Range   WBC 5.5 4.0 - 10.5 K/uL   RBC 4.82 4.22 - 5.81 MIL/uL   Hemoglobin 13.8 13.0 - 17.0 g/dL   HCT 29.5 28.4 - 13.2 %   MCV 85.1 78.0 - 100.0 fL   MCH 28.6 26.0 - 34.0 pg   MCHC 33.7 30.0 - 36.0 g/dL   RDW 44.0 10.2 - 72.5 %   Platelets 199 150 - 400 K/uL  Comprehensive metabolic panel  Result Value Ref Range   Sodium 144 135 - 145 mmol/L   Potassium 3.7 3.5 - 5.1 mmol/L   Chloride 108 98 - 111 mmol/L   CO2 25 22 - 32 mmol/L   Glucose, Bld 85 70 - 99 mg/dL   BUN 13 6 - 20 mg/dL   Creatinine, Ser 3.66 0.61 - 1.24 mg/dL   Calcium 9.7 8.9 - 44.0 mg/dL   Total Protein 8.1 6.5 - 8.1 g/dL   Albumin 4.6 3.5 - 5.0 g/dL   AST 22 15 - 41 U/L   ALT 10 0 - 44 U/L   Alkaline Phosphatase 80 38 - 126 U/L   Total Bilirubin 0.6 0.3 - 1.2 mg/dL   GFR calc non Af Amer >60 >60 mL/min   GFR calc Af Amer >60 >60 mL/min   Anion gap 11 5 - 15  Ethanol  Result Value Ref Range   Alcohol, Ethyl (B) <10 <10 mg/dL    EKG None  Radiology No results found.  Procedures Procedures (including critical care time)  Medications Ordered in ED Medications - No data to display   Initial Impression / Assessment and Plan / ED Course  I have reviewed the triage vital signs and the nursing notes.  Pertinent labs & imaging results that were available during my care of the patient were reviewed by me and considered in my medical decision making (see chart for details).  Labs sent.   Reviewed nursing notes and prior charts for additional history.   BH team consulted.   Labs reviewed - chem normal.  Recheck pt  - alert, content, nad.   BH evaluation pending - disposition per [facility] team.      Final Clinical Impressions(s) / ED Diagnoses   Final diagnoses:  None    ED Discharge Orders    None       Cathren Laine, MD 04/21/18 2009

## 2018-04-21 NOTE — ED Notes (Signed)
Bed: [facility]4 Expected date:  Expected time:  Means of arrival:  Comments: 

## 2018-04-21 NOTE — ED Notes (Signed)
ED Provider at bedside. 

## 2018-04-21 NOTE — ED Triage Notes (Addendum)
Pt brought in by [facility] from side of road where pt was found with pants down at his ankles. Pt c/o blurred vision to EMS x 2 days.  Also stating various random things like "I am pregnant".

## 2018-04-21 NOTE — ED Notes (Signed)
Patient denies SI/HI.AVH. Plan of care discussed. Encouragement and support provided and safety maintain. Q 15 min safety checks in place and video monitoring.  

## 2018-04-21 NOTE — BH Assessment (Addendum)
Assessment Note  Collin White is an 22 y.o. male who presents to the ED voluntarily. Per chart, pt identifies as male. Pt was found wandering outside with her pants to her ankles, wearing no underwear, exposing her genitals. Pt states EMS and police officers brought her to the ED. Upon entering the room, the pt presented with a potent odor. Pt appears disheveled and poorly groomed. Pt was asked to verify her DOB and she stated "August." Of note, the pt's DOB is [birth year]. Pt states her head feels like a rock. Pt states she attends [employer] and lives on campus in a dorm but she does not have a keypad for the dorm. Per chart, pt has a hx of homelessness and is not attending school. Pt is speaking softly during the assessment and at times is inaudible. Pt denies SI and denies HI. TTS asked the pt if she experiences AVH and pt denies however the pt appears to be responding to internal stimuli during the assessment as she is smiling at inappropriate times. Pt also presents with delusional thoughts and reported to EDP that she believes she is pregnant.   Pt has 28 ED visits in the past 6 months. Pt's previous TTS assessment was conducted on 03/07/18 due to delusions and believing that she was poisoned by [employer] workers.   TTS consulted with Nira Conn, NP who recommends continued observation for safety and stabilization and social work consulted to assist with possible care coordination. EDP Cathren Laine, MD and pt's nurse Marylou Flesher, RN has been advised.   Diagnosis: Schizophrenia   Past Medical History:  Past Medical History:  Diagnosis Date  . Migraines   . Psychoses (HCC)   . Schizophrenia (HCC)   . Seizures (HCC)     Past Surgical History:  Procedure Laterality Date  . abd surgery s/p traumatic event    . facial reconstructive surgery    . NO PAST SURGERIES  patient stated he had facial reconstruction surgery as a child   . tumor removed from head       Family History:   Family History  Problem Relation Age of Onset  . Hypertension Other   . Mental illness Neg Hx     Social History:  reports that he has quit smoking. His smoking use included cigarettes. He has never used smokeless tobacco. He reports that he does not drink alcohol or use drugs.  Additional Social History:  Alcohol / Drug Use Pain Medications: See MAR Prescriptions: See MAR Over the Counter: See MAR History of alcohol / drug use?: No history of alcohol / drug abuse  CIWA: CIWA-Ar BP: 122/90 Pulse Rate: 76 COWS:    Allergies: No Known Allergies  Home Medications:  (Not in a hospital admission)  OB/GYN Status:  No LMP for male patient.  General Assessment Data Location of Assessment: [facility] ED TTS Assessment: In system Is this a Tele or Face-to-Face Assessment?: Face-to-Face Is this an Initial Assessment or a Re-assessment for this encounter?: Initial Assessment Patient Accompanied by:: (alone) Language Other than English: No Living Arrangements: Homeless/Shelter What gender do you identify as?: Male Marital status: Single Pregnancy Status: No Living Arrangements: Alone Can pt return to current living arrangement?: Yes Admission Status: Voluntary Is patient capable of signing voluntary admission?: Yes Referral Source: Self/Family/Friend Insurance type: Medicaid     Crisis Care Plan Living Arrangements: Alone Name of Psychiatrist: Vesta Mixer Name of Therapist: Vesta Mixer  Education Status Is patient currently in school?: No Is the patient employed, unemployed  or receiving disability?: Unemployed  Risk to self with the past 6 months Suicidal Ideation: No Has patient been a risk to self within the past 6 months prior to admission? : No Suicidal Intent: No Has patient had any suicidal intent within the past 6 months prior to admission? : No Is patient at risk for suicide?: No Suicidal Plan?: No Has patient had any suicidal plan within the past 6 months prior to  admission? : No Access to Means: No What has been your use of drugs/alcohol within the last 12 months?: pt denies use  Previous Attempts/Gestures: No Triggers for Past Attempts: None known Intentional Self Injurious Behavior: None Family Suicide History: No Recent stressful life event(s): Other (Comment)(not taking psych meds) Persecutory voices/beliefs?: No Depression: No Substance abuse history and/or treatment for substance abuse?: No Suicide prevention information given to non-admitted patients: Not applicable  Risk to Others within the past 6 months Homicidal Ideation: No Does patient have any lifetime risk of violence toward others beyond the six months prior to admission? : No Thoughts of Harm to Others: No Current Homicidal Intent: No Current Homicidal Plan: No Access to Homicidal Means: No History of harm to others?: No Assessment of Violence: None Noted Does patient have access to weapons?: No Criminal Charges Pending?: No Does patient have a court date: No Is patient on probation?: No  Psychosis Hallucinations: (UTA) Delusions: Unspecified  Mental Status Report Appearance/Hygiene: Body odor, Poor hygiene Eye Contact: Good Motor Activity: Freedom of movement Speech: Soft Level of Consciousness: Quiet/awake Mood: Labile Affect: Inconsistent with thought content, Flat Anxiety Level: None Thought Processes: Flight of Ideas Judgement: Impaired Orientation: Not oriented Obsessive Compulsive Thoughts/Behaviors: None  Cognitive Functioning Concentration: Decreased Memory: Remote Impaired, Recent Impaired Is patient IDD: No Insight: Poor Impulse Control: Poor Appetite: Good Have you had any weight changes? : No Change Sleep: No Change Total Hours of Sleep: 9 Vegetative Symptoms: Not bathing  ADLScreening [facility] Assessment Services) Patient's cognitive ability adequate to safely complete daily activities?: Yes Patient able to express need for assistance with  ADLs?: Yes Independently performs ADLs?: Yes (appropriate for developmental age)  Prior Inpatient Therapy Prior Inpatient Therapy: Yes Prior Therapy Dates: 2019, 2018 and mult other admissions  Prior Therapy Facilty/Provider(s): [facility] Reason for Treatment: Paranoid schizophrenia   Prior Outpatient Therapy Prior Outpatient Therapy: Yes Prior Therapy Dates: CURRENT Prior Therapy Facilty/Provider(s): [name] Reason for Treatment: MED MANAGEMENT Does patient have an ACCT team?: No Does patient have Intensive In-House Services?  : No Does patient have [name] services? : Yes Does patient have P4CC services?: No  ADL Screening (condition at time of admission) Patient's cognitive ability adequate to safely complete daily activities?: Yes Is the patient deaf or have difficulty hearing?: No Does the patient have difficulty seeing, even when wearing glasses/contacts?: No Does the patient have difficulty concentrating, remembering, or making decisions?: Yes Patient able to express need for assistance with ADLs?: Yes Does the patient have difficulty dressing or bathing?: No Independently performs ADLs?: Yes (appropriate for developmental age) Does the patient have difficulty walking or climbing stairs?: No Weakness of Legs: None Weakness of Arms/Hands: None  Home Assistive Devices/Equipment Home Assistive Devices/Equipment: None    Abuse/Neglect Assessment (Assessment to be complete while patient is alone) Abuse/Neglect Assessment Can Be Completed: Yes Physical Abuse: Denies Verbal Abuse: Denies Sexual Abuse: Denies Exploitation of patient/patient's resources: Denies Self-Neglect: Denies     Merchant navy officer (For Healthcare) Does Patient Have a Medical Advance Directive?: No Would patient like information on creating  a medical advance directive?: No - Patient declined          Disposition: TTS consulted with Nira Conn, NP who recommends continued observation for safety and  stabilization and social work consulted to assist with possible care coordination. EDP Cathren Laine, MD and pt's nurse Marylou Flesher, RN has been advised.   Disposition Initial Assessment Completed for this Encounter: Yes Disposition of Patient: (OVERNIGHT OBS PENDING AM PSYCH ASSESSMENT ) Patient refused recommended treatment: No  On Site Evaluation by:   Reviewed with Physician:    Karolee Ohs 04/21/2018 9:15 PM

## 2018-04-22 DIAGNOSIS — Z87891 Personal history of nicotine dependence: Secondary | ICD-10-CM

## 2018-04-22 DIAGNOSIS — F200 Paranoid schizophrenia: Secondary | ICD-10-CM

## 2018-04-22 MED ORDER — PALIPERIDONE ER 9 MG PO TB24: 9.0000 mg | ORAL_TABLET | Freq: Every day | ORAL | 0 refills | Status: DC

## 2018-04-22 MED ORDER — CITALOPRAM HYDROBROMIDE 20 MG PO TABS: 20.0000 mg | ORAL_TABLET | Freq: Every day | ORAL | 0 refills | Status: DC

## 2018-04-22 MED ORDER — PALIPERIDONE ER 6 MG PO TB24
9.0000 mg | ORAL_TABLET | Freq: Every day | ORAL | Status: DC
  Administered 2018-04-22: 9 mg via ORAL
  Filled 2018-04-22: qty 1

## 2018-04-22 MED ORDER — CITALOPRAM HYDROBROMIDE 10 MG PO TABS
20.0000 mg | ORAL_TABLET | Freq: Every day | ORAL | Status: DC
  Administered 2018-04-22: 20 mg via ORAL
  Filled 2018-04-22: qty 2

## 2018-04-22 NOTE — Consult Note (Signed)
[name] Psych ED Discharge  04/22/2018 12:34 PM Hemingway Mcrill  MRN:  [id number] Principal Problem: Paranoid schizophrenia [facility]) Discharge Diagnoses:  Patient Active Problem List   Diagnosis Date Noted  . Acute episode of schizophrenia with history of multiple episodes (HCC) [F23] 06/30/2017  . Schizophrenia, paranoid type (HCC) [F20.0] 06/08/2017  . 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 Paranoid schizophrenia (HCC) [F20.0] 09/29/2015  . 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 History of posttraumatic stress disorder (PTSD) [Z86.59] 09/26/2015    Subjective: Pt was seen and chart reviewed with treatment team and Dr Lucianne Muss.  Pt denies suicidal/homicidal ideation, denies auditory/visual hallucinations and does not appear to be responding to internal stimuli. Pt is a male-to-male transgender and goes by [employer]. Pt stated she is here because her stomach was hurting but denies everything else. Pt is well known to this emergency room and appears to be at baseline. Pt is followed by [name] for medication management. Pt was given Invega 6 mg and Celexa 20 mg PO while in the emergency room and will be provided a prescription for these medications upon discharge. Pt's UDS and BAL are negative. Pt's remaining lab work is unremarkable. Pt is stable and psychiatrically clear for discharge.   Total Time spent with patient: 45 minutes  Past Psychiatric History: As above  Past Medical History:  Past Medical History:  Diagnosis Date  . Migraines   . Psychoses (HCC)   . Schizophrenia (HCC)   . Seizures (HCC)    Past Surgical History:  Procedure Laterality Date  . abd surgery s/p traumatic event    . facial reconstructive surgery    . NO PAST SURGERIES  patient stated he had facial reconstruction surgery as a child   . 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  Hypertension Other   . Mental illness Neg Hx    Family Psychiatric  History: Unknown Social History:  Social History   Substance and Sexual Activity  Alcohol Use No    Social History   Substance and Sexual Activity  Drug Use No   Social History   Socioeconomic History  . Marital status: Single    Spouse name: Not on file  . Number of children: Not on file  . Years of education: Not on file  . Highest education level: Not on file  Occupational History  . Not on file  Social Needs  . Financial resource strain: Not on file  . Food insecurity:    Worry: Not on file    Inability: Not on file  . Transportation needs:    Medical: Not on file    Non-medical: Not on file  Tobacco Use  . Smoking status: Former Smoker    Types: Cigarettes  . Smokeless tobacco: Never Used  Substance and Sexual Activity  . Alcohol use: No  . Drug use: No  . Sexual activity: Yes    Birth control/protection: Condom  Lifestyle  . Physical activity:    Days per week: Not on file    Minutes per session: Not on file  . Stress: Not on file  Relationships  . Social connections:    Talks on phone: Not on file    Gets together: Not on file    Attends religious service: Not on file    Active member of club or organization: Not on file  Attends meetings of clubs or organizations: Not on file    Relationship status: Not on file  Other Topics Concern  . Not on file  Social History Narrative   ** Merged History Encounter **       ** Merged History Encounter **        Has this patient used any form of tobacco in the last 30 days? (Cigarettes, Smokeless Tobacco, Cigars, and/or Pipes) Prescription not provided because: Pt declined  Current Medications: Current Facility-Administered Medications  Medication Dose Route Frequency Provider Last Rate Last Dose  . citalopram (CELEXA) tablet 20 mg  20 mg Oral Daily Laveda Abbe, NP   20 mg at 04/22/18 1127  . paliperidone (INVEGA) 24 hr tablet  9 mg  9 mg Oral Daily Laveda Abbe, NP   9 mg at 04/22/18 1158   Current Outpatient Medications  Medication Sig Dispense Refill  . omeprazole (PRILOSEC) 20 MG capsule Take 1 capsule (20 mg total) by mouth daily for 14 days. 14 capsule 0    Musculoskeletal: Strength & Muscle Tone: within normal limits Gait & Station: normal Patient leans: N/A  Psychiatric Specialty Exam: Physical Exam  Constitutional: He is oriented to person, place, and time. He appears well-developed and well-nourished.  HENT:  Head: Normocephalic.  Respiratory: Effort normal.  Musculoskeletal: Normal range of motion.  Neurological: He is alert and oriented to person, place, and time.  Psychiatric: His speech is normal and behavior is normal. Thought content normal. His mood appears anxious. Cognition and memory are normal. He expresses impulsivity. He exhibits a depressed mood.    Review of Systems  Psychiatric/Behavioral: Positive for depression. Negative for hallucinations, memory loss, substance abuse and suicidal ideas. The patient is not nervous/anxious and does not have insomnia.   All other systems reviewed and are negative.   Blood pressure 122/90, pulse 76, temperature 98.6 F (37 C), temperature source Oral, resp. rate 15, SpO2 100 %.There is no height or weight on file to calculate BMI.  General Appearance: Casual  Eye Contact:  Fair  Speech:  Clear and Coherent  Volume:  Decreased  Mood:  Anxious and Depressed  Affect:  Congruent, Depressed and Flat  Thought Process:  Coherent and Linear  Orientation:  Full (Time, Place, and Person)  Thought Content:  Logical  Suicidal Thoughts:  No  Homicidal Thoughts:  No  Memory:  Immediate;   Good Recent;   Fair Remote;   Fair  Judgement:  Fair  Insight:  Fair  Psychomotor Activity:  Normal  Concentration:  Concentration: Good and Attention Span: Good  Recall:  [employer] of Knowledge:  Fair  Language:  Good  Akathisia:  No  Handed:  Right   AIMS (if indicated):     Assets:  Communication Skills  ADL's:  Intact  Cognition:  WNL  Sleep:        Demographic Factors:  Male, Adolescent or young adult, Low socioeconomic status and Unemployed  Loss Factors: Financial problems/change in socioeconomic status  Historical Factors: Impulsivity  Risk Reduction Factors:   Sense of responsibility to family  Continued Clinical Symptoms:  Depression:   Impulsivity Schizophrenia:   Paranoid or undifferentiated type  Cognitive Features That Contribute To Risk:  Closed-mindedness    Suicide Risk:  Minimal: No identifiable suicidal ideation.  Patients presenting with no risk factors but with morbid ruminations; may be classified as minimal risk based on the severity of the depressive symptoms  Follow-up Information    Mee Hives  Dewayne Hatch, NP Follow up.   Why:  Please go see Chales Abrahams at the [name].  She can become your doctor and help you with all kinds of things. Contact information: [address] [place] [address] [phone]           Plan Of Care/Follow-up recommendations:  Activity:  as tolerated Diet:  Heart healthy  Disposition: Take all medications as prescribed. -Invega 6 mg PO daily for mood control -Celexa 20 mg PO daily for depression Keep all follow-up appointments as scheduled.  Do not consume alcohol or use illegal drugs while on prescription medications. Report any adverse effects from your medications to your primary care provider promptly.  In the event of recurrent symptoms or worsening symptoms, call 911, a crisis hotline, or go to the nearest emergency department for evaluation.   Laveda Abbe, NP 04/22/2018, 12:34 PM

## 2018-04-22 NOTE — Discharge Instructions (Signed)
For your behavioral health needs, you are advised to continue treatment with [name]: ° °     [name] °     [address] °     [place], [place] [address] °     [phone] °

## 2018-04-22 NOTE — BH Assessment (Signed)
[name] Assessment Progress Note  Per Nelly Rout, MD, this pt does not require psychiatric hospitalization at this time.  Pt is to be discharged from [facility] with recommendation to continue treatment with [facility].  This has been included in pt's discharge instructions.  Pt's nurse, Velna Hatchet, has been notified.  Doylene Canning, MA Triage Specialist [phone]

## 2018-04-25 ENCOUNTER — Emergency Department (HOSPITAL_COMMUNITY)
Admission: EM | Admit: 2018-04-25 | Discharge: 2018-04-26 | Disposition: A | Discharge status: Home / Self Care | Payer: Medicaid Other | Attending: Emergency Medicine | Admitting: Emergency Medicine

## 2018-04-25 ENCOUNTER — Encounter (HOSPITAL_COMMUNITY): Payer: Self-pay

## 2018-04-25 ENCOUNTER — Other Ambulatory Visit: Payer: Self-pay

## 2018-04-25 DIAGNOSIS — F200 Paranoid schizophrenia: Secondary | ICD-10-CM | POA: Insufficient documentation

## 2018-04-25 DIAGNOSIS — J019 Acute sinusitis, unspecified: Secondary | ICD-10-CM | POA: Insufficient documentation

## 2018-04-25 DIAGNOSIS — R05 Cough: Secondary | ICD-10-CM | POA: Insufficient documentation

## 2018-04-25 DIAGNOSIS — R0981 Nasal congestion: Secondary | ICD-10-CM | POA: Insufficient documentation

## 2018-04-25 DIAGNOSIS — Z87891 Personal history of nicotine dependence: Secondary | ICD-10-CM | POA: Insufficient documentation

## 2018-04-25 DIAGNOSIS — J069 Acute upper respiratory infection, unspecified: Secondary | ICD-10-CM | POA: Insufficient documentation

## 2018-04-25 MED ORDER — OXYMETAZOLINE HCL 0.05 % NA SOLN
1.0000 | Freq: Once | NASAL | Status: AC
  Administered 2018-04-26: 1 via NASAL
  Filled 2018-04-25: qty 15

## 2018-04-25 MED ORDER — IBUPROFEN 200 MG PO TABS
600.0000 mg | ORAL_TABLET | Freq: Once | ORAL | Status: AC
  Administered 2018-04-26: 600 mg via ORAL
  Filled 2018-04-25: qty 3

## 2018-04-25 MED ORDER — LORATADINE 10 MG PO TABS
10.0000 mg | ORAL_TABLET | Freq: Once | ORAL | Status: AC
  Administered 2018-04-26: 10 mg via ORAL
  Filled 2018-04-25: qty 1

## 2018-04-25 MED ORDER — ACETAMINOPHEN 325 MG PO TABS
650.0000 mg | ORAL_TABLET | Freq: Once | ORAL | Status: AC
  Administered 2018-04-26: 650 mg via ORAL
  Filled 2018-04-25: qty 2

## 2018-04-25 NOTE — ED Triage Notes (Signed)
Pt states that his L eye hurts when he rubs it. He also reports forehead pain that is "not a headache." Pt is notably nasally congested

## 2018-04-26 NOTE — Discharge Instructions (Addendum)
You can take Claritin over-the-counter for your stuffy nose, use the nasal spray 2-3 sprays in each nostril twice a day, you do not use more than 3 days or your nasal congestion will get worse.  You can take ibuprofen 600 mg and/or acetaminophen 650 mg every 6 hours as needed for pain.  Recheck if you are not improving in the next week to 10 days or if you get a fever.

## 2018-04-26 NOTE — ED Provider Notes (Signed)
[facility]-EMERGENCY DEPT Provider Note   CSN: [id number] Arrival date & time: 04/25/18  2052  Time seen 23:40 PM    History   Chief Complaint Chief Complaint  Patient presents with  . Headache  . Eye Pain    HPI Collin White is a 22 y.o. male.  HPI patient states he got sick on Friday, September 6.  He states he has pains and indicates the whole right side of his face bilaterally and in his forehead.  He states it is a pressure and pounding feeling.  He states he has metal bars in his face on both sides from facial fractures when he was 22 years old.  States he cannot take medication "because it will go into the metal".  However R when I talked to him about the nurse bringing him pills he was okay with it.  He denies nausea, vomiting, blurred vision.  He states he has had a cough for the past couple days, he states he is coughing up green sputum.  He states he is having rhinorrhea that is white.  He denies sore throat or sneezing.  He states his nose is "clogged".  Patient states he had brain surgery when he was 22 years old.  PCP Patient, No Pcp Per   Past Medical History:  Diagnosis Date  . Migraines   . Psychoses (HCC)   . Schizophrenia (HCC)   . Seizures [facility])     Patient Active Problem List   Diagnosis Date Noted  . Acute episode of schizophrenia with history of multiple episodes (HCC) 06/30/2017  . Schizophrenia, paranoid type (HCC) 06/08/2017  . 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 Psychoses (HCC)   . Paranoid schizophrenia (HCC) 09/29/2015  . Cannabis use disorder, moderate, in early remission (HCC) 09/26/2015  . History of posttraumatic stress disorder (PTSD) 09/26/2015    Past Surgical History:  Procedure Laterality Date  . abd surgery s/p traumatic event    . facial reconstructive surgery    . NO PAST SURGERIES  patient stated he had facial reconstruction surgery as a child   . tumor removed from head            Home Medications    Prior to Admission medications   Medication Sig Start Date End Date Taking? Authorizing Provider  citalopram (CELEXA) 20 MG tablet Take 1 tablet (20 mg total) by mouth daily. 04/23/18  Yes Laveda Abbe, NP  paliperidone (INVEGA) 9 MG 24 hr tablet Take 1 tablet (9 mg total) by mouth daily. 04/23/18  Yes Laveda Abbe, NP    Family History Family History  Problem Relation Age of Onset  . Hypertension Other   . Mental illness Neg Hx     Social History Social History   Tobacco Use  . Smoking status: Former Smoker    Types: Cigarettes  . Smokeless tobacco: Never Used  Substance Use Topics  . Alcohol use: No  . Drug use: No  on disability for seizure and brain surgery   Allergies   Patient has no known allergies.   Review of Systems Review of Systems  All other systems reviewed and are negative.    Physical Exam Updated Vital Signs BP 120/82 (BP Location: Right Arm)   Pulse 75   Temp 98.3 F (36.8 C) (Oral)   Resp 15   SpO2 98%   Physical Exam  Constitutional: He is oriented to person, place, and time. He appears well-developed and well-nourished.  Non-toxic appearance.  He does not appear ill. No distress.  HENT:  Head: Normocephalic and atraumatic.  Right Ear: Tympanic membrane, external ear and ear canal normal.  Left Ear: Tympanic membrane, external ear and ear canal normal.  Nose: Mucosal edema and rhinorrhea present.  Mouth/Throat: Oropharynx is clear and moist and mucous membranes are normal. No dental abscesses or uvula swelling.  Patient sounds stuffy when he talks.  Eyes: Pupils are equal, round, and reactive to light. Conjunctivae and EOM are normal.  Neck: Normal range of motion and full passive range of motion without pain. Neck supple.  Cardiovascular: Normal rate, regular rhythm and normal heart sounds. Exam reveals no gallop and no friction rub.  No murmur heard. Pulmonary/Chest: Effort normal and  breath sounds normal. No respiratory distress. He has no wheezes. He has no rhonchi. He has no rales. He exhibits no tenderness and no crepitus.  Abdominal: Soft. Normal appearance and bowel sounds are normal. He exhibits no distension. There is no tenderness. There is no rebound and no guarding.  Musculoskeletal: Normal range of motion. He exhibits no edema or tenderness.  Moves all extremities well.   Neurological: He is alert and oriented to person, place, and time. He has normal strength. No cranial nerve deficit.  Skin: Skin is warm, dry and intact. No rash noted. No erythema. No pallor.  Psychiatric: He has a normal mood and affect. His speech is normal and behavior is normal. His mood appears not anxious.  Nursing note and vitals reviewed.    ED Treatments / Results  Labs (all labs ordered are listed, but only abnormal results are displayed) Labs Reviewed - No data to display  EKG None  Radiology No results found.  Procedures Procedures (including critical care time)  Medications Ordered in ED Medications  loratadine (CLARITIN) tablet 10 mg (10 mg Oral Given 04/26/18 0006)  ibuprofen (ADVIL,MOTRIN) tablet 600 mg (600 mg Oral Given 04/26/18 0006)  acetaminophen (TYLENOL) tablet 650 mg (650 mg Oral Given 04/26/18 0007)  oxymetazoline (AFRIN) 0.05 % nasal spray 1 spray (1 spray Each Nare Given 04/26/18 0007)     Initial Impression / Assessment and Plan / ED Course  I have reviewed the triage vital signs and the nursing notes.  Pertinent labs & imaging results that were available during my care of the patient were reviewed by me and considered in my medical decision making (see chart for details).     I talked to the patient he has a URI/sinus infection.  He is only had symptoms 2 days.  He will be instructed on symptomatic relief.  Final Clinical Impressions(s) / ED Diagnoses   Final diagnoses:  Upper respiratory tract infection, unspecified type    ED Discharge Orders     None    OTC claritin, afrin, ibuprofen, acetaminophen  Plan discharge  Devoria Albe, MD, Concha Pyo, MD 04/26/18 0040

## 2018-05-01 ENCOUNTER — Encounter (HOSPITAL_COMMUNITY): Payer: Self-pay | Admitting: Obstetrics and Gynecology

## 2018-05-01 ENCOUNTER — Emergency Department (HOSPITAL_COMMUNITY): Payer: Self-pay

## 2018-05-01 ENCOUNTER — Other Ambulatory Visit: Payer: Self-pay

## 2018-05-01 ENCOUNTER — Emergency Department (HOSPITAL_COMMUNITY)
Admission: EM | Admit: 2018-05-01 | Discharge: 2018-05-01 | Disposition: A | Discharge status: Home / Self Care | Payer: Self-pay | Attending: Emergency Medicine | Admitting: Emergency Medicine

## 2018-05-01 DIAGNOSIS — Y999 Unspecified external cause status: Secondary | ICD-10-CM | POA: Insufficient documentation

## 2018-05-01 DIAGNOSIS — S022XXA Fracture of nasal bones, initial encounter for closed fracture: Secondary | ICD-10-CM | POA: Insufficient documentation

## 2018-05-01 DIAGNOSIS — M25512 Pain in left shoulder: Secondary | ICD-10-CM

## 2018-05-01 DIAGNOSIS — Y929 Unspecified place or not applicable: Secondary | ICD-10-CM | POA: Insufficient documentation

## 2018-05-01 DIAGNOSIS — S50311A Abrasion of right elbow, initial encounter: Secondary | ICD-10-CM | POA: Insufficient documentation

## 2018-05-01 DIAGNOSIS — Y939 Activity, unspecified: Secondary | ICD-10-CM | POA: Insufficient documentation

## 2018-05-01 DIAGNOSIS — S161XXA Strain of muscle, fascia and tendon at neck level, initial encounter: Secondary | ICD-10-CM | POA: Insufficient documentation

## 2018-05-01 DIAGNOSIS — S0083XA Contusion of other part of head, initial encounter: Secondary | ICD-10-CM

## 2018-05-01 DIAGNOSIS — S0990XA Unspecified injury of head, initial encounter: Secondary | ICD-10-CM

## 2018-05-01 DIAGNOSIS — Z87891 Personal history of nicotine dependence: Secondary | ICD-10-CM | POA: Insufficient documentation

## 2018-05-01 DIAGNOSIS — Z23 Encounter for immunization: Secondary | ICD-10-CM | POA: Insufficient documentation

## 2018-05-01 DIAGNOSIS — S0231XA Fracture of orbital floor, right side, initial encounter for closed fracture: Secondary | ICD-10-CM | POA: Insufficient documentation

## 2018-05-01 LAB — CBC
HEMATOCRIT: 38.2 % — AB (ref 39.0–52.0)
Hemoglobin: 12.4 g/dL — ABNORMAL LOW (ref 13.0–17.0)
MCH: 28.3 pg (ref 26.0–34.0)
MCHC: 32.5 g/dL (ref 30.0–36.0)
MCV: 87.2 fL (ref 78.0–100.0)
Platelets: 209 10*3/uL (ref 150–400)
RBC: 4.38 MIL/uL (ref 4.22–5.81)
RDW: 13.3 % (ref 11.5–15.5)
WBC: 10 10*3/uL (ref 4.0–10.5)

## 2018-05-01 LAB — BASIC METABOLIC PANEL
ANION GAP: 14 (ref 5–15)
BUN: 11 mg/dL (ref 6–20)
CALCIUM: 9.3 mg/dL (ref 8.9–10.3)
CHLORIDE: 107 mmol/L (ref 98–111)
CO2: 22 mmol/L (ref 22–32)
Creatinine, Ser: 1.21 mg/dL (ref 0.61–1.24)
GFR calc non Af Amer: >60 mL/min (ref >60)
Glucose, Bld: 106 mg/dL — ABNORMAL HIGH (ref 70–99)
Potassium: 3.4 mmol/L — ABNORMAL LOW (ref 3.5–5.1)
Sodium: 143 mmol/L (ref 135–145)

## 2018-05-01 MED ORDER — TETANUS-DIPHTH-ACELL PERTUSSIS 5-2.5-18.5 LF-MCG/0.5 IM SUSP
0.5000 mL | Freq: Once | INTRAMUSCULAR | Status: AC
  Administered 2018-05-01: 0.5 mL via INTRAMUSCULAR
  Filled 2018-05-01: qty 0.5

## 2018-05-01 MED ORDER — HYDROCODONE-ACETAMINOPHEN 5-325 MG PO TABS: 1.0000 | ORAL_TABLET | Freq: Four times a day (QID) | ORAL | 0 refills | Status: DC | PRN

## 2018-05-01 MED ORDER — KETOROLAC TROMETHAMINE 30 MG/ML IJ SOLN
30.0000 mg | Freq: Once | INTRAMUSCULAR | Status: AC
  Administered 2018-05-01: 30 mg via INTRAMUSCULAR
  Filled 2018-05-01: qty 1

## 2018-05-01 MED ORDER — CEPHALEXIN 500 MG PO CAPS: 500.0000 mg | ORAL_CAPSULE | Freq: Four times a day (QID) | ORAL | 0 refills | Status: DC

## 2018-05-01 MED ORDER — BACITRACIN ZINC 500 UNIT/GM EX OINT
TOPICAL_OINTMENT | Freq: Once | CUTANEOUS | Status: AC
  Administered 2018-05-01: 1 via TOPICAL
  Filled 2018-05-01: qty 0.9

## 2018-05-01 NOTE — Discharge Instructions (Addendum)
Keflex as prescribed.  Hydrocodone is prescribed as needed for pain.  Ice the swollen areas for 20 minutes every 2 hours while awake for the next 2 days.  Follow-up with ENT in the next 3 to 4 days.  The contact information for Dr. Jearld FentonByers has been provided in this discharge summary for you to call and make these arrangements.

## 2018-05-01 NOTE — ED Notes (Signed)
Awaiting Ortho.
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
## 2018-05-01 NOTE — ED Notes (Signed)
Ortho at bedside.
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
## 2018-05-01 NOTE — Discharge Instructions (Addendum)
DO NOT BLOW YOUR NOSE. Apply ice to your eye and nose for 20 minutes at a time.  Apply ice to your shoulder for 20 minutes at a time.  Wear the sling for protection. Take ibuprofen every 6 hours for pain. Schedule an appointment with the orthopedic specialist to follow-up on your shoulder injury. Return to the ER if you have vision loss, severe headache, vomiting, or new or concerning symptoms.

## 2018-05-01 NOTE — ED Notes (Signed)
Patient transported to X-ray 
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
## 2018-05-10 ENCOUNTER — Encounter (HOSPITAL_COMMUNITY): Payer: Self-pay | Admitting: Emergency Medicine

## 2018-05-10 DIAGNOSIS — R300 Dysuria: Secondary | ICD-10-CM | POA: Insufficient documentation

## 2018-05-10 DIAGNOSIS — Z5321 Procedure and treatment not carried out due to patient leaving prior to being seen by health care provider: Secondary | ICD-10-CM | POA: Insufficient documentation

## 2018-05-10 NOTE — ED Triage Notes (Signed)
Pt c/o burning when urinates that started today.

## 2018-05-11 ENCOUNTER — Encounter (HOSPITAL_COMMUNITY): Payer: Self-pay | Admitting: Family Medicine

## 2018-05-11 ENCOUNTER — Emergency Department (HOSPITAL_COMMUNITY)
Admission: EM | Admit: 2018-05-11 | Discharge: 2018-05-11 | Disposition: A | Discharge status: Home / Self Care | Payer: Self-pay | Attending: Emergency Medicine | Admitting: Emergency Medicine

## 2018-05-11 DIAGNOSIS — F258 Other schizoaffective disorders: Secondary | ICD-10-CM | POA: Insufficient documentation

## 2018-05-11 DIAGNOSIS — Z87891 Personal history of nicotine dependence: Secondary | ICD-10-CM | POA: Insufficient documentation

## 2018-05-11 DIAGNOSIS — B356 Tinea cruris: Secondary | ICD-10-CM | POA: Insufficient documentation

## 2018-05-11 LAB — URINALYSIS, ROUTINE W REFLEX MICROSCOPIC
BILIRUBIN URINE: NEGATIVE
GLUCOSE, UA: NEGATIVE mg/dL
HGB URINE DIPSTICK: NEGATIVE
KETONES UR: NEGATIVE mg/dL
LEUKOCYTES UA: NEGATIVE
Nitrite: NEGATIVE
Protein, ur: NEGATIVE mg/dL
Specific Gravity, Urine: 1.023 (ref 1.005–1.030)
pH: 6 (ref 5.0–8.0)

## 2018-05-11 MED ORDER — KETOCONAZOLE 2 % EX CREA
TOPICAL_CREAM | Freq: Every day | CUTANEOUS | Status: DC
  Administered 2018-05-11: via TOPICAL
  Filled 2018-05-11: qty 15

## 2018-05-11 NOTE — Discharge Instructions (Addendum)
Apply cream given to you to the affected area once daily. Follow up with your primary care provider. Follow attached handout. If you develop worsening or new concerning symptoms you can return to the emergency department for re-evaluation.

## 2018-05-11 NOTE — ED Notes (Addendum)
Provided patient clothes and assisted patient to the shower. Patient took a White, warm shower. Provided patient personal hygiene products supplied by [employer]. Patient provided prescribed ketoconazole after his shower. He applied it to the affected areas.  Patient left before obtaining vital signs.

## 2018-05-11 NOTE — ED Notes (Signed)
Brought the pt some clothes and handed them to the pt.  He started to look at them and asked this nurse if they are male clothes.  Pt threw the clothes on the floor and states "I can't wear these, they are male clothes, I'm not a man."
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
## 2018-05-11 NOTE — ED Triage Notes (Signed)
Collin White is complaining of dysuria "for weeks" and buttock burning. Denies fever.

## 2018-05-11 NOTE — ED Provider Notes (Signed)
[facility]-EMERGENCY DEPT Provider Note   CSN: [id number] Arrival date & time: 05/11/18  1851     History   Chief Complaint Chief Complaint  Patient presents with  . Dysuria    HPI Collin White is a 22 y.o. male with a history of schizophrenia, psychosis, PTSD presents emergency department today for itching and burning of groin.  Patient reports that he has had a fungal infection in his genitalia region over the last several weeks.  He describes the area as burning, pruritic and rale.  He notes that he has poor hygiene.  He denies recent sexual activity or concerns for STDs.  He denies any sores, ulcers or lesions.  No testicular pain, penile pain penile discharge, urinary frequency, urine urgency, dysuria, hematuria, painful bowel movements, nausea/vomiting/diarrhea, abdominal pain or fever.  He has not tried anything for symptoms.  HPI  Past Medical History:  Diagnosis Date  . Migraines   . Psychoses (HCC)   . Schizophrenia (HCC)   . Seizures [facility](HCC)     Patient Active Problem List   Diagnosis Date Noted  . Acute episode of schizophrenia with history of multiple episodes (HCC) 06/30/2017  . Schizophrenia, paranoid type (HCC) 06/08/2017  . 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 Psychoses (HCC)   . Paranoid schizophrenia (HCC) 09/29/2015  . Cannabis use disorder, moderate, in early remission (HCC) 09/26/2015  . History of posttraumatic stress disorder (PTSD) 09/26/2015    Past Surgical History:  Procedure Laterality Date  . abd surgery s/p traumatic event    . facial reconstructive surgery    . NO PAST SURGERIES  patient stated he had facial reconstruction surgery as a child   . tumor removed from head           Home Medications    Prior to Admission medications   Medication Sig Start Date End Date Taking? Authorizing Provider  cephALEXin (KEFLEX) 500 MG capsule Take 1 capsule (500 mg total) by mouth 4 (four) times daily. 05/01/18    Geoffery Lyonselo, Douglas, MD  citalopram (CELEXA) 20 MG tablet Take 1 tablet (20 mg total) by mouth daily. Patient not taking: Reported on 05/01/2018 04/23/18   Laveda AbbeParks, Laurie Britton, NP  HYDROcodone-acetaminophen [facility](NORCO) 5-325 MG tablet Take 1-2 tablets by mouth every 6 (six) hours as needed. 05/01/18   Geoffery Lyonselo, Douglas, MD  paliperidone (INVEGA) 9 MG 24 hr tablet Take 1 tablet (9 mg total) by mouth daily. Patient not taking: Reported on 05/01/2018 04/23/18   Laveda AbbeParks, Laurie Britton, NP    Family History Family History  Problem Relation Age of Onset  . Hypertension Other   . Mental illness Neg Hx     Social History Social History   Tobacco Use  . Smoking status: Former Smoker    Types: Cigarettes  . Smokeless tobacco: Never Used  Substance Use Topics  . Alcohol use: No  . Drug use: No     Allergies   Patient has no known allergies.   Review of Systems Review of Systems  All other systems reviewed and are negative.    Physical Exam Updated Vital Signs BP 117/79 (BP Location: Left Arm)   Pulse 65   Temp 98.5 F (36.9 C) (Oral)   Resp 16   Ht 6' (1.829 m)   Wt 63.5 kg   SpO2 100%   BMI 18.99 kg/m   Physical Exam  Constitutional: He appears well-developed and well-nourished.  HENT:  Head: Normocephalic and atraumatic.  Right Ear: External ear normal.  Left Ear: External ear normal.  Eyes: Conjunctivae are normal. Right eye exhibits no discharge. Left eye exhibits no discharge. No scleral icterus.  Pulmonary/Chest: Effort normal. No respiratory distress.  Abdominal: Soft. Normal appearance and bowel sounds are normal. He exhibits no distension. There is no tenderness. There is no rigidity, no rebound and no guarding.  Genitourinary: Testes normal and penis normal. Right testis shows no mass, no swelling and no tenderness. Left testis shows no mass, no swelling and no tenderness.  Genitourinary Comments: Chaperone present during genital exam. Patient with noted tinea cruris in the  groin. No bumps on head of penis, specifically no vesicles concerning for herpes or chancre suggestive of syphillis, no pain with palpation, no discharge or urethritis noted, scrotum and testicles w/o erythema or swelling, NTTP   Neurological: He is alert.  Skin: No pallor.  Psychiatric: He has a normal mood and affect.  Nursing note and vitals reviewed.    ED Treatments / Results  Labs (all labs ordered are listed, but only abnormal results are displayed) Labs Reviewed  URINALYSIS, ROUTINE W REFLEX MICROSCOPIC    EKG None  Radiology No results found.  Procedures Procedures (including critical care time)  Medications Ordered in ED Medications  ketoconazole (NIZORAL) 2 % cream (has no administration in time range)     Initial Impression / Assessment and Plan / ED Course  I have reviewed the triage vital signs and the nursing notes.  Pertinent labs & imaging results that were available during my care of the patient were reviewed by me and considered in my medical decision making (see chart for details).     22 y.o. male with jock itch.  Denies any urinary symptoms. UA unremarkable. He denies testing for STDs at this time.  Patient without painful bowel movements, fever, abdominal pain, nausea/vomiting to make me concern for prostatitis.  Patient without vesicles concerning for herpes or canker-like sores concerning for syphilis.  Is without any tenderness or swelling of the testicles.  Do not feel he needs a ultrasound at this time.  Will treat with ketoconazole cream once per day recommend follow with PCP. This was given in the department. Return precautions were discussed.  Patient appears safe for discharge.  Final Clinical Impressions(s) / ED Diagnoses   Final diagnoses:  Tinea cruris    ED Discharge Orders    None       Princella Pellegrini 05/11/18 2230    Linwood Dibbles, MD 05/14/18 1321

## 2018-05-11 NOTE — ED Notes (Signed)
Pt called to be roomed x2 with no response.  RN notified. 

## 2018-05-11 NOTE — ED Notes (Signed)
Bed: W[facility]6 Expected date:  Expected time:  Means of arrival:  Comments: 

## 2018-05-19 ENCOUNTER — Encounter (HOSPITAL_COMMUNITY): Payer: Self-pay | Admitting: Emergency Medicine

## 2018-05-19 ENCOUNTER — Emergency Department (HOSPITAL_COMMUNITY)
Admission: EM | Admit: 2018-05-19 | Discharge: 2018-05-20 | Disposition: A | Discharge status: Home / Self Care | Payer: Self-pay | Attending: Emergency Medicine | Admitting: Emergency Medicine

## 2018-05-19 DIAGNOSIS — R197 Diarrhea, unspecified: Secondary | ICD-10-CM | POA: Insufficient documentation

## 2018-05-19 DIAGNOSIS — Z87891 Personal history of nicotine dependence: Secondary | ICD-10-CM | POA: Insufficient documentation

## 2018-05-19 DIAGNOSIS — R112 Nausea with vomiting, unspecified: Secondary | ICD-10-CM | POA: Insufficient documentation

## 2018-05-19 LAB — CBC
HCT: 40.2 % (ref 39.0–52.0)
Hemoglobin: 13.3 g/dL (ref 13.0–17.0)
MCH: 28.9 pg (ref 26.0–34.0)
MCHC: 33.1 g/dL (ref 30.0–36.0)
MCV: 87.2 fL (ref 78.0–100.0)
Platelets: 230 10*3/uL (ref 150–400)
RBC: 4.61 MIL/uL (ref 4.22–5.81)
RDW: 14.3 % (ref 11.5–15.5)
WBC: 6.4 10*3/uL (ref 4.0–10.5)

## 2018-05-19 LAB — LIPASE, BLOOD: Lipase: 29 U/L (ref 11–51)

## 2018-05-19 LAB — COMPREHENSIVE METABOLIC PANEL
ALBUMIN: 4.4 g/dL (ref 3.5–5.0)
ALT: 11 U/L (ref 0–44)
AST: 19 U/L (ref 15–41)
Alkaline Phosphatase: 80 U/L (ref 38–126)
Anion gap: 8 (ref 5–15)
BILIRUBIN TOTAL: 0.6 mg/dL (ref 0.3–1.2)
BUN: 14 mg/dL (ref 6–20)
CHLORIDE: 111 mmol/L (ref 98–111)
CO2: 28 mmol/L (ref 22–32)
Calcium: 9.8 mg/dL (ref 8.9–10.3)
Creatinine, Ser: 1.09 mg/dL (ref 0.61–1.24)
GFR calc Af Amer: >60 mL/min (ref >60)
GFR calc non Af Amer: >60 mL/min (ref >60)
GLUCOSE: 80 mg/dL (ref 70–99)
POTASSIUM: 4 mmol/L (ref 3.5–5.1)
Sodium: 147 mmol/L — ABNORMAL HIGH (ref 135–145)
TOTAL PROTEIN: 7.9 g/dL (ref 6.5–8.1)

## 2018-05-19 MED ORDER — ONDANSETRON 8 MG PO TBDP
8.0000 mg | ORAL_TABLET | Freq: Once | ORAL | Status: AC
  Administered 2018-05-19: 8 mg via ORAL
  Filled 2018-05-19: qty 1

## 2018-05-19 MED ORDER — DICYCLOMINE HCL 10 MG/ML IM SOLN
20.0000 mg | Freq: Once | INTRAMUSCULAR | Status: AC
  Administered 2018-05-19: 20 mg via INTRAMUSCULAR
  Filled 2018-05-19: qty 2

## 2018-05-19 MED ORDER — ACETAMINOPHEN 500 MG PO TABS
1000.0000 mg | ORAL_TABLET | Freq: Once | ORAL | Status: AC
  Administered 2018-05-19: 1000 mg via ORAL
  Filled 2018-05-19: qty 2

## 2018-05-19 NOTE — ED Triage Notes (Signed)
Pt c/o not feeling good since Monday. Pt rubbing belly and states "I just dont feel good".

## 2018-05-20 ENCOUNTER — Emergency Department (HOSPITAL_COMMUNITY)
Admission: EM | Admit: 2018-05-20 | Discharge: 2018-05-21 | Disposition: A | Discharge status: Home / Self Care | Payer: Self-pay | Attending: Emergency Medicine | Admitting: Emergency Medicine

## 2018-05-20 ENCOUNTER — Encounter (HOSPITAL_COMMUNITY): Payer: Self-pay | Admitting: Emergency Medicine

## 2018-05-20 ENCOUNTER — Encounter (HOSPITAL_COMMUNITY): Payer: Self-pay | Admitting: *Deleted

## 2018-05-20 ENCOUNTER — Emergency Department (HOSPITAL_COMMUNITY): Payer: Self-pay

## 2018-05-20 DIAGNOSIS — R51 Headache: Secondary | ICD-10-CM | POA: Insufficient documentation

## 2018-05-20 DIAGNOSIS — Z87891 Personal history of nicotine dependence: Secondary | ICD-10-CM | POA: Insufficient documentation

## 2018-05-20 DIAGNOSIS — R531 Weakness: Secondary | ICD-10-CM | POA: Insufficient documentation

## 2018-05-20 DIAGNOSIS — R262 Difficulty in walking, not elsewhere classified: Secondary | ICD-10-CM | POA: Insufficient documentation

## 2018-05-20 LAB — URINALYSIS, ROUTINE W REFLEX MICROSCOPIC
Bilirubin Urine: NEGATIVE
GLUCOSE, UA: NEGATIVE mg/dL
Hgb urine dipstick: NEGATIVE
KETONES UR: 5 mg/dL — AB
LEUKOCYTES UA: NEGATIVE
Nitrite: NEGATIVE
PROTEIN: NEGATIVE mg/dL
Specific Gravity, Urine: 1.028 (ref 1.005–1.030)
pH: 5 (ref 5.0–8.0)

## 2018-05-20 MED ORDER — ONDANSETRON 8 MG PO TBDP: ORAL_TABLET | ORAL | 0 refills | Status: DC

## 2018-05-20 NOTE — ED Triage Notes (Signed)
Pt states he can't walk, has a headache, bilateral arm weakness,  and states his right lung hurts. Pt states that when he touches his face, he stops breathing. Pt states he has had symptoms for 3 days.

## 2018-05-20 NOTE — ED Provider Notes (Signed)
[facility]-EMERGENCY DEPT Provider Note   CSN: [id number] Arrival date & time: 05/19/18  1649     History   Chief Complaint Chief Complaint  Patient presents with  . Abdominal Pain  . dont feel good    HPI Collin White is a 22 y.o. male.  The history is provided by the patient.  Diarrhea   This is a new problem. The current episode started yesterday. The problem occurs 2 to 4 times per day. The problem has not changed since onset.The stool consistency is described as watery. There has been no fever. Pertinent negatives include no abdominal pain, no chills, no sweats, no headaches, no URI and no cough. He has tried nothing for the symptoms. The treatment provided no relief.    Past Medical History:  Diagnosis Date  . Migraines   . Psychoses (HCC)   . Schizophrenia (HCC)   . Seizures [facility])     Patient Active Problem List   Diagnosis Date Noted  . Acute episode of schizophrenia with history of multiple episodes (HCC) 06/30/2017  . Schizophrenia, paranoid type (HCC) 06/08/2017  . 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 Psychoses (HCC)   . Paranoid schizophrenia (HCC) 09/29/2015  . Cannabis use disorder, moderate, in early remission (HCC) 09/26/2015  . History of posttraumatic stress disorder (PTSD) 09/26/2015    Past Surgical History:  Procedure Laterality Date  . abd surgery s/p traumatic event    . facial reconstructive surgery    . NO PAST SURGERIES  patient stated he had facial reconstruction surgery as a child   . tumor removed from head           Home Medications    Prior to Admission medications   Medication Sig Start Date End Date Taking? Authorizing Provider  cephALEXin (KEFLEX) 500 MG capsule Take 1 capsule (500 mg total) by mouth 4 (four) times daily. Patient not taking: Reported on 05/19/2018 05/01/18   Geoffery Lyons, MD  citalopram (CELEXA) 20 MG tablet Take 1 tablet (20 mg total) by mouth daily. Patient not taking:  Reported on 05/01/2018 04/23/18   Laveda Abbe, NP  HYDROcodone-acetaminophen [name]) 5-325 MG tablet Take 1-2 tablets by mouth every 6 (six) hours as needed. Patient not taking: Reported on 05/19/2018 05/01/18   Geoffery Lyons, MD  paliperidone (INVEGA) 9 MG 24 hr tablet Take 1 tablet (9 mg total) by mouth daily. Patient not taking: Reported on 05/01/2018 04/23/18   Laveda Abbe, NP    Family History Family History  Problem Relation Age of Onset  . Hypertension Other   . Mental illness Neg Hx     Social History Social History   Tobacco Use  . Smoking status: Former Smoker    Types: Cigarettes  . Smokeless tobacco: Never Used  Substance Use Topics  . Alcohol use: No  . Drug use: No     Allergies   Patient has no known allergies.   Review of Systems Review of Systems  Constitutional: Negative for chills, diaphoresis, fatigue and fever.  HENT: Negative for congestion, trouble swallowing and voice change.   Respiratory: Negative for cough.   Cardiovascular: Negative for chest pain.  Gastrointestinal: Positive for diarrhea and nausea. Negative for abdominal distention, abdominal pain and constipation.  Musculoskeletal: Negative for neck pain and neck stiffness.  Skin: Negative for rash.  Neurological: Negative for dizziness, facial asymmetry, speech difficulty, weakness, light-headedness, numbness and headaches.  Psychiatric/Behavioral: Negative for self-injury, sleep disturbance and suicidal ideas. The patient is  not nervous/anxious.   All other systems reviewed and are negative.    Physical Exam Updated Vital Signs BP (!) 106/92   Pulse (!) 47   Temp (!) 97.4 F (36.3 C) (Oral)   Resp 15   SpO2 100%   Physical Exam  Constitutional: He is oriented to person, place, and time. He appears well-developed and well-nourished. No distress.  HENT:  Head: Normocephalic and atraumatic.  Mouth/Throat: No oropharyngeal exudate.  Eyes: Pupils are equal, round, and  reactive to light. Conjunctivae and EOM are normal.  Neck: Normal range of motion. Neck supple.  Cardiovascular: Normal rate, regular rhythm, normal heart sounds and intact distal pulses.  Pulmonary/Chest: Effort normal and breath sounds normal. No stridor. He has no wheezes. He has no rales.  Abdominal: Soft. Bowel sounds are normal. He exhibits no mass. There is no tenderness. There is no rebound and no guarding.  Musculoskeletal: Normal range of motion.  Neurological: He is alert and oriented to person, place, and time. He displays normal reflexes.  Skin: Skin is warm and dry. Capillary refill takes less than 2 seconds.  Psychiatric: He has a normal mood and affect.     ED Treatments / Results  Labs (all labs ordered are listed, but only abnormal results are displayed) Labs Reviewed  COMPREHENSIVE METABOLIC PANEL - Abnormal; Notable for the following components:      Result Value   Sodium 147 (*)    All other components within normal limits  URINALYSIS, ROUTINE W REFLEX MICROSCOPIC - Abnormal; Notable for the following components:   Ketones, ur 5 (*)    All other components within normal limits  LIPASE, BLOOD  CBC    EKG None  Radiology No results found.  Procedures Procedures (including critical care time)  Medications Ordered in ED Medications  dicyclomine (BENTYL) injection 20 mg (20 mg Intramuscular Given 05/19/18 2340)  ondansetron (ZOFRAN-ODT) disintegrating tablet 8 mg (8 mg Oral Given 05/19/18 2341)  acetaminophen (TYLENOL) tablet 1,000 mg (1,000 mg Oral Given 05/19/18 2341)      Final Clinical Impressions(s) / ED Diagnoses   Walked and PO challenged.     Return for weakness, numbness, changes in vision or speech, fevers >100.4 unrelieved by medication, shortness of breath, intractable vomiting, or diarrhea, abdominal pain, Inability to tolerate liquids or food, cough, altered mental status or any concerns. No signs of systemic illness or infection. The  patient is nontoxic-appearing on exam and vital signs are within normal limits.    I have reviewed the triage vital signs and the nursing notes. Pertinent labs &imaging results that were available during my care of the patient were reviewed by me and considered in my medical decision making (see chart for details).  After history, exam, and medical workup I feel the patient has been appropriately medically screened and is safe for discharge home. Pertinent diagnoses were discussed with the patient. Patient was given return precautions.    Hillery Bhalla, MD 05/20/18 [phone]

## 2018-05-20 NOTE — ED Notes (Signed)
Pt ambulated to bathroom independently and with steady gait. Pt tolerating POs at this time

## 2018-05-20 NOTE — ED Notes (Signed)
Pt refusing to give a pain score or description when asked if they are experiencing pain. Pt response is "I just don't feel good". MD aware

## 2018-05-20 NOTE — ED Provider Notes (Signed)
[facility]-EMERGENCY DEPT Provider Note   CSN: [id number] Arrival date & time: 05/20/18  1535     History   Chief Complaint Chief Complaint  Patient presents with  . Weakness  . Headache  . Difficulty Walking    HPI Collin White is a 22 y.o. male.  Patient is a 22 year old male with past medical history of male to male transgender resume, psychosis, schizophrenia, migraines.  He presents today for evaluation of multiple complaints.  He states that he has a headache, discomfort in the right side of his chest, "vomiting from his lung", and is having difficulty ambulating.  He was seen yesterday with complaints of nausea and vomiting, and discharged after receiving medications and his laboratory studies were unremarkable.  The history is provided by the patient.  Weakness  This is a new problem. The current episode started yesterday. The problem has not changed since onset.There was no focality noted. There has been no fever. Associated symptoms include headaches.  Headache      Past Medical History:  Diagnosis Date  . Migraines   . Psychoses (HCC)   . Schizophrenia (HCC)   . Seizures [facility])     Patient Active Problem List   Diagnosis Date Noted  . Acute episode of schizophrenia with history of multiple episodes (HCC) 06/30/2017  . Schizophrenia, paranoid type (HCC) 06/08/2017  . 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 Psychoses (HCC)   . Paranoid schizophrenia (HCC) 09/29/2015  . Cannabis use disorder, moderate, in early remission (HCC) 09/26/2015  . History of posttraumatic stress disorder (PTSD) 09/26/2015    Past Surgical History:  Procedure Laterality Date  . abd surgery s/p traumatic event    . facial reconstructive surgery    . NO PAST SURGERIES  patient stated he had facial reconstruction surgery as a child   . tumor removed from head           Home Medications    Prior to Admission medications   Medication Sig  Start Date End Date Taking? Authorizing Provider  cephALEXin (KEFLEX) 500 MG capsule Take 1 capsule (500 mg total) by mouth 4 (four) times daily. Patient not taking: Reported on 05/19/2018 05/01/18   Geoffery Lyons, MD  citalopram (CELEXA) 20 MG tablet Take 1 tablet (20 mg total) by mouth daily. Patient not taking: Reported on 05/01/2018 04/23/18   Laveda Abbe, NP  HYDROcodone-acetaminophen [facility]) 5-325 MG tablet Take 1-2 tablets by mouth every 6 (six) hours as needed. Patient not taking: Reported on 05/19/2018 05/01/18   Geoffery Lyons, MD  ondansetron [facility] ODT) 8 MG disintegrating tablet 8mg  ODT q8 hours prn nausea 05/20/18   Palumbo, April, MD  paliperidone (INVEGA) 9 MG 24 hr tablet Take 1 tablet (9 mg total) by mouth daily. Patient not taking: Reported on 05/01/2018 04/23/18   Laveda Abbe, NP    Family History Family History  Problem Relation Age of Onset  . Hypertension Other   . Mental illness Neg Hx     Social History Social History   Tobacco Use  . Smoking status: Former Smoker    Types: Cigarettes  . Smokeless tobacco: Never Used  Substance Use Topics  . Alcohol use: No  . Drug use: No     Allergies   Patient has no known allergies.   Review of Systems Review of Systems  Neurological: Positive for weakness and headaches.  All other systems reviewed and are negative.    Physical Exam Updated Vital Signs BP 118/84 (  BP Location: Left Arm)   Pulse 61   Temp 97.8 F (36.6 C) (Oral)   Resp 18   SpO2 100%   Physical Exam  Constitutional: He is oriented to person, place, and time. He appears well-developed and well-nourished. No distress.  HENT:  Head: Normocephalic and atraumatic.  Mouth/Throat: Oropharynx is clear and moist.  Neck: Normal range of motion. Neck supple.  Cardiovascular: Normal rate and regular rhythm. Exam reveals no friction rub.  No murmur heard. Pulmonary/Chest: Effort normal and breath sounds normal. No respiratory  distress. He has no wheezes. He has no rales.  Abdominal: Soft. Bowel sounds are normal. He exhibits no distension. There is no tenderness.  Musculoskeletal: Normal range of motion. He exhibits no edema.  Neurological: He is alert and oriented to person, place, and time. Coordination normal.  Neurologic exam is difficult and inconsistent as patient is uncooperative.  He does move all extremities.  Skin: Skin is warm and dry. He is not diaphoretic.  Nursing note and vitals reviewed.    ED Treatments / Results  Labs (all labs ordered are listed, but only abnormal results are displayed) Labs Reviewed - No data to display  EKG None  Radiology No results found.  Procedures Procedures (including critical care time)  Medications Ordered in ED Medications - No data to display   Initial Impression / Assessment and Plan / ED Course  I have reviewed the triage vital signs and the nursing notes.  Pertinent labs & imaging results that were available during my care of the patient were reviewed by me and considered in my medical decision making (see chart for details).  Patient well-known to the emergency department for frequent visits.  He presents today with complaints of "not feeling well" and stating that he is too weak to walk although he was able to ambulate to the ER and his exam room.  His physical examination is unremarkable and imaging studies are normal.  He had laboratory studies performed yesterday and I see no indication to repeat these.  He has an extensive psychiatric history and I believe much of his symptomatology is psychosomatic.  I see nothing physical that would explain what he is describing.  At this point, I feel as though he is appropriate for discharge.  He is to continue his medications as before and follow-up with his primary doctor.  Final Clinical Impressions(s) / ED Diagnoses   Final diagnoses:  None    ED Discharge Orders    None       Geoffery Lyons,  MD 05/21/18 [phone]

## 2018-05-21 NOTE — Discharge Instructions (Addendum)
Follow-up with your primary doctor if symptoms are not improving in the next 3 to 4 days.

## 2018-06-07 ENCOUNTER — Other Ambulatory Visit: Payer: Self-pay

## 2018-06-07 ENCOUNTER — Encounter (HOSPITAL_COMMUNITY): Payer: Self-pay | Admitting: Emergency Medicine

## 2018-06-07 DIAGNOSIS — R4183 Borderline intellectual functioning: Secondary | ICD-10-CM | POA: Insufficient documentation

## 2018-06-07 DIAGNOSIS — Z87891 Personal history of nicotine dependence: Secondary | ICD-10-CM | POA: Insufficient documentation

## 2018-06-07 DIAGNOSIS — F209 Schizophrenia, unspecified: Secondary | ICD-10-CM | POA: Insufficient documentation

## 2018-06-07 DIAGNOSIS — R0602 Shortness of breath: Secondary | ICD-10-CM | POA: Insufficient documentation

## 2018-06-07 DIAGNOSIS — F1220 Cannabis dependence, uncomplicated: Secondary | ICD-10-CM | POA: Insufficient documentation

## 2018-06-07 NOTE — ED Triage Notes (Addendum)
Patient c/o N/V/D and bilateral leg weakness "since last week." C/o facial pain and right lung pain. Denies cough, chest pain and SOB.

## 2018-06-08 ENCOUNTER — Emergency Department (HOSPITAL_COMMUNITY)
Admission: EM | Admit: 2018-06-08 | Discharge: 2018-06-08 | Disposition: A | Discharge status: Home / Self Care | Payer: Self-pay | Attending: Emergency Medicine | Admitting: Emergency Medicine

## 2018-06-08 ENCOUNTER — Emergency Department (HOSPITAL_COMMUNITY): Payer: Self-pay

## 2018-06-08 DIAGNOSIS — R0602 Shortness of breath: Secondary | ICD-10-CM

## 2018-06-08 LAB — CBG MONITORING, ED: GLUCOSE-CAPILLARY: 81 mg/dL (ref 70–99)

## 2018-06-08 NOTE — ED Notes (Signed)
Patient ambulated in hallway with no assistance and tolerated well; patient's oxygen saturations stayed at 100% during walk.

## 2018-06-08 NOTE — ED Provider Notes (Signed)
[facility]-EMERGENCY DEPT Provider Note   CSN: [id number] Arrival date & time: 06/07/18  1944     History   Chief Complaint Chief Complaint  Patient presents with  . Emesis  . Diarrhea    HPI Collin White is a 22 y.o. male.   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  He states that whenever he touches his face he feels short of breath.  He complains of generalized weakness.  Denies fever, cough, chest pain.  No medications taken prior to arrival for symptoms.  The patient is well-known to the emergency department with 32 visits in the past 6 months.     Past Medical History:  Diagnosis Date  . Migraines   . Psychoses (HCC)   . Schizophrenia (HCC)   . Seizures [facility])     Patient Active Problem List   Diagnosis Date Noted  . Acute episode of schizophrenia with history of multiple episodes (HCC) 06/30/2017  . Schizophrenia, paranoid type (HCC) 06/08/2017  . 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 Psychoses (HCC)   . Paranoid schizophrenia (HCC) 09/29/2015  . Cannabis use disorder, moderate, in early remission (HCC) 09/26/2015  . History of posttraumatic stress disorder (PTSD) 09/26/2015    Past Surgical History:  Procedure Laterality Date  . abd surgery s/p traumatic event    . facial reconstructive surgery    . NO PAST SURGERIES  patient stated he had facial reconstruction surgery as a child   . tumor removed from head           Home Medications    Prior to Admission medications   Medication Sig Start Date End Date Taking? Authorizing Provider  cephALEXin (KEFLEX) 500 MG capsule Take 1 capsule (500 mg total) by mouth 4 (four) times daily. Patient not taking: Reported on 05/19/2018 05/01/18   Geoffery Lyons, MD  citalopram (CELEXA) 20 MG tablet Take 1 tablet (20 mg total) by mouth  daily. Patient not taking: Reported on 05/01/2018 04/23/18   Laveda Abbe, NP  HYDROcodone-acetaminophen [facility]) 5-325 MG tablet Take 1-2 tablets by mouth every 6 (six) hours as needed. Patient not taking: Reported on 05/19/2018 05/01/18   Geoffery Lyons, MD  ondansetron [facility] ODT) 8 MG disintegrating tablet 8mg  ODT q8 hours prn nausea Patient not taking: Reported on 06/08/2018 05/20/18   Palumbo, April, MD  paliperidone (INVEGA) 9 MG 24 hr tablet Take 1 tablet (9 mg total) by mouth daily. Patient not taking: Reported on 05/01/2018 04/23/18   Laveda Abbe, NP    Family History Family History  Problem Relation Age of Onset  . Hypertension Other   . Mental illness Neg Hx     Social History Social History   Tobacco Use  . Smoking status: Former Smoker    Types: Cigarettes  . Smokeless tobacco: Never Used  Substance Use Topics  . Alcohol use: No  . Drug use: No     Allergies   Patient has no known allergies.   Review of Systems Review of Systems Ten systems reviewed and are negative for acute change, except as noted in the HPI.    Physical Exam Updated Vital Signs BP 116/89 (BP Location: Left Arm)   Pulse 76   Temp 98.7 F (37.1 C) (Oral)   Resp 16   SpO2 100%   Physical Exam  Constitutional: He is oriented to person, place, and  time. He appears well-developed and well-nourished. No distress.  Patient resting comfortably on initial presentation, in NAD  HENT:  Head: Normocephalic and atraumatic.  Eyes: Conjunctivae and EOM are normal. No scleral icterus.  Neck: Normal range of motion.  Cardiovascular: Normal rate, regular rhythm and intact distal pulses.  Pulmonary/Chest: Effort normal. No stridor. No respiratory distress. He has no wheezes. He has no rales.  Lungs CTAB. Respirations even and unlabored.  Musculoskeletal: Normal range of motion.  Neurological: He is alert and oriented to person, place, and time. He exhibits normal muscle tone.  Coordination normal.  Skin: Skin is warm and dry. No rash noted. He is not diaphoretic. No erythema. No pallor.  Psychiatric: He has a normal mood and affect. His behavior is normal.  Nursing note and vitals reviewed.    ED Treatments / Results  Labs (all labs ordered are listed, but only abnormal results are displayed) Labs Reviewed  CBG MONITORING, ED    EKG None  Radiology Dg Chest 2 View  Result Date: 06/08/2018 CLINICAL DATA:  Shortness of breath.  Bilateral leg weakness. EXAM: CHEST - 2 VIEW COMPARISON:  05/20/2018 FINDINGS: The cardiomediastinal contours are normal. The lungs are clear. Pulmonary vasculature is normal. No consolidation, pleural effusion, or pneumothorax. No acute osseous abnormalities are seen. IMPRESSION: Negative radiographs of chest. Electronically Signed   By: Narda Rutherford M.D.   On: 06/08/2018 03:12    Procedures Procedures (including critical care time)  Medications Ordered in ED Medications - No data to display   Initial Impression / Assessment and Plan / ED Course  I have reviewed the triage vital signs and the nursing notes.  Pertinent labs & imaging results that were available during my care of the patient were reviewed by me and considered in my medical decision making (see chart for details).     The patient is well-known to the emergency department and presents today for complaints of his right long being sick.  States that he feels short of breath when he touches his face.  His chest x-ray shows no acute cardiopulmonary abnormality today.  He has been able to ambulate in the emergency department without hypoxia.  Also noting generalized weakness.  His vitals have been stable.  His CBG is normal.  He is well-known to the ED with 32 visits in the past 6 months.  Given reassuring evaluation, I do not believe further emergent work-up is indicated.  He has been instructed to follow-up with a primary care doctor regarding his visit today.   Return precautions provided. Patient discharged in stable condition with no unaddressed concerns.   Final Clinical Impressions(s) / ED Diagnoses   Final diagnoses:  Shortness of breath    ED Discharge Orders    None       Antony Madura, PA-C 06/08/18 0411    Derwood Kaplan, MD 06/08/18 [phone]

## 2018-06-08 NOTE — Discharge Instructions (Signed)
Your work-up in the emergency department was reassuring.  We recommend that you continue to follow-up with a primary doctor to ensure that your symptoms resolve.

## 2018-06-09 ENCOUNTER — Encounter (HOSPITAL_COMMUNITY): Payer: Self-pay | Admitting: Emergency Medicine

## 2018-06-09 ENCOUNTER — Other Ambulatory Visit: Payer: Self-pay

## 2018-06-09 ENCOUNTER — Emergency Department (HOSPITAL_COMMUNITY)
Admission: EM | Admit: 2018-06-09 | Discharge: 2018-06-09 | Disposition: A | Discharge status: Home / Self Care | Payer: Self-pay | Attending: Emergency Medicine | Admitting: Emergency Medicine

## 2018-06-09 DIAGNOSIS — R531 Weakness: Secondary | ICD-10-CM

## 2018-06-09 DIAGNOSIS — F200 Paranoid schizophrenia: Secondary | ICD-10-CM | POA: Insufficient documentation

## 2018-06-09 DIAGNOSIS — R4183 Borderline intellectual functioning: Secondary | ICD-10-CM | POA: Insufficient documentation

## 2018-06-09 DIAGNOSIS — Z79899 Other long term (current) drug therapy: Secondary | ICD-10-CM | POA: Insufficient documentation

## 2018-06-09 DIAGNOSIS — Z87891 Personal history of nicotine dependence: Secondary | ICD-10-CM | POA: Insufficient documentation

## 2018-06-09 DIAGNOSIS — R0602 Shortness of breath: Secondary | ICD-10-CM

## 2018-06-09 DIAGNOSIS — F209 Schizophrenia, unspecified: Secondary | ICD-10-CM | POA: Insufficient documentation

## 2018-06-09 DIAGNOSIS — R110 Nausea: Secondary | ICD-10-CM | POA: Insufficient documentation

## 2018-06-09 MED ORDER — ACETAMINOPHEN 325 MG PO TABS
650.0000 mg | ORAL_TABLET | Freq: Once | ORAL | Status: DC
  Filled 2018-06-09: qty 2

## 2018-06-09 MED ORDER — ONDANSETRON 4 MG PO TBDP
4.0000 mg | ORAL_TABLET | Freq: Once | ORAL | Status: AC
  Administered 2018-06-09: 4 mg via ORAL
  Filled 2018-06-09: qty 1

## 2018-06-09 NOTE — ED Triage Notes (Signed)
Per EMS, patient from [facility], reports nausea and bilateral leg weakness. Reports "when I touch my face I stop breathing."

## 2018-06-09 NOTE — ED Provider Notes (Signed)
[facility]-EMERGENCY DEPT Provider Note   CSN: [id number] Arrival date & time: 06/09/18  1431     History   Chief Complaint Chief Complaint  Patient presents with  . Shortness of Breath  . General pain    HPI Collin White is a 22 y.o. male with PMHx schizophrenia, presenting to the ED with complaint that his right lung is "sick" and BLE weakness. He states he feels like the back of his legs are stiff making it hard to walk.  Denies injury or back pain.  His right lung is "sick." states he "went critical" yesterday while in the ED and went into shock.  Per chart review, no such events happened yesterday.  He states when he touches his face, he feels short of breath.  Symptoms are unchanged since yesterday.  Denies fever, cough, congestion, sore throat, abdominal pain, N/V. Pt is well-known to this ED, with 32 visits in the last 6 months. Last visit yesterday for similar complaints with normal CXR.  Patient also had recent lab work done in the beginning of this month which was all within normal limits.  Denies auditory visual hallucinations.  No recent medications.  Denies illicit drug use, states "I will die if I use any drugs."  The history is provided by the patient.    Past Medical History:  Diagnosis Date  . Migraines   . Psychoses (HCC)   . Schizophrenia (HCC)   . Seizures [facility])     Patient Active Problem List   Diagnosis Date Noted  . Acute episode of schizophrenia with history of multiple episodes (HCC) 06/30/2017  . Schizophrenia, paranoid type (HCC) 06/08/2017  . 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 Psychoses (HCC)   . Paranoid schizophrenia (HCC) 09/29/2015  . Cannabis use disorder, moderate, in early remission (HCC) 09/26/2015  . History of posttraumatic stress disorder (PTSD) 09/26/2015    Past Surgical History:  Procedure Laterality Date  . abd surgery s/p traumatic event    . facial reconstructive surgery    . NO PAST  SURGERIES  patient stated he had facial reconstruction surgery as a child   . tumor removed from head           Home Medications    Prior to Admission medications   Medication Sig Start Date End Date Taking? Authorizing Provider  cephALEXin (KEFLEX) 500 MG capsule Take 1 capsule (500 mg total) by mouth 4 (four) times daily. Patient not taking: Reported on 05/19/2018 05/01/18   Geoffery Lyons, MD  citalopram (CELEXA) 20 MG tablet Take 1 tablet (20 mg total) by mouth daily. Patient not taking: Reported on 05/01/2018 04/23/18   Laveda Abbe, NP  HYDROcodone-acetaminophen [facility]) 5-325 MG tablet Take 1-2 tablets by mouth every 6 (six) hours as needed. Patient not taking: Reported on 05/19/2018 05/01/18   Geoffery Lyons, MD  ondansetron [facility] ODT) 8 MG disintegrating tablet 8mg  ODT q8 hours prn nausea Patient not taking: Reported on 06/08/2018 05/20/18   Palumbo, April, MD  paliperidone (INVEGA) 9 MG 24 hr tablet Take 1 tablet (9 mg total) by mouth daily. Patient not taking: Reported on 05/01/2018 04/23/18   Laveda Abbe, NP    Family History Family History  Problem Relation Age of Onset  . Hypertension Other   . Mental illness Neg Hx     Social History Social History   Tobacco Use  . Smoking status: Former Smoker    Types: Cigarettes  . Smokeless tobacco: Never Used  Substance  Use Topics  . Alcohol use: No  . Drug use: No     Allergies   Patient has no known allergies.   Review of Systems Review of Systems  Constitutional: Negative for fever.  HENT: Negative for congestion, sore throat and trouble swallowing.   Respiratory: Positive for shortness of breath. Negative for cough.   Gastrointestinal: Negative for abdominal pain, nausea and vomiting.  Neurological: Positive for weakness.     Physical Exam Updated Vital Signs BP 106/77 (BP Location: Right Arm)   Pulse 95   Temp 98 F (36.7 C) (Oral)   Resp 18   Ht 6' (1.829 m)   Wt 63.5 kg   SpO2 100%    BMI 18.99 kg/m   Physical Exam  Constitutional: He appears well-developed and well-nourished.  Poor hygiene.  Not distressed.  HENT:  Head: Normocephalic and atraumatic.  Eyes: Conjunctivae are normal.  Cardiovascular: Normal rate, regular rhythm and normal heart sounds.  Pulmonary/Chest: Effort normal and breath sounds normal. No respiratory distress. He exhibits tenderness (Right lateral chest wall tenderness, crepitus.  Symmetric chest expansion.).  Abdominal: Soft. Bowel sounds are normal. There is no tenderness.  Musculoskeletal: Normal range of motion.  Neurological:  Patient ambulating in the ED, with knees slightly bent and hip slightly externally rotated.  Psychiatric: He has a normal mood and affect. His behavior is normal.  Nursing note and vitals reviewed.    ED Treatments / Results  Labs (all labs ordered are listed, but only abnormal results are displayed) Labs Reviewed - No data to display  EKG None  Radiology Dg Chest 2 View  Result Date: 06/08/2018 CLINICAL DATA:  Shortness of breath.  Bilateral leg weakness. EXAM: CHEST - 2 VIEW COMPARISON:  05/20/2018 FINDINGS: The cardiomediastinal contours are normal. The lungs are clear. Pulmonary vasculature is normal. No consolidation, pleural effusion, or pneumothorax. No acute osseous abnormalities are seen. IMPRESSION: Negative radiographs of chest. Electronically Signed   By: Narda Rutherford M.D.   On: 06/08/2018 03:12    Procedures Procedures (including critical care time)  Medications Ordered in ED Medications  acetaminophen (TYLENOL) tablet 650 mg (has no administration in time range)     Initial Impression / Assessment and Plan / ED Course  I have reviewed the triage vital signs and the nursing notes.  Pertinent labs & imaging results that were available during my care of the patient were reviewed by me and considered in my medical decision making (see chart for details).     Patient presenting to  the emergency department with similar complaint to yesterday's visit.  Well-known to this ED with 32 visits the last 6 months.  States he feels short of breath when he touches his face, and thinks his lung is sick.  Chest x-ray done yesterday was normal.  Vital signs are stable today, normal work of breathing.  Oxygen saturation 100% on room air.  Lungs are clear, normal work of breathing.  Some right sided chest wall pain that is reproducible.  No obvious neuro deficits on exam though walking with knees slightly bent and hips externally rotated.  Nontender to legs.  No evidence of life-threatening or emergent pathology today.  Do not believe further work-up is indicated today.  Discussed symptomatic management and PCP follow-up.  Safe for discharge.  Discussed results, findings, treatment and follow up. Patient advised of return precautions. Patient verbalized understanding and agreed with plan.  Final Clinical Impressions(s) / ED Diagnoses   Final diagnoses:  SOB (shortness of  breath)  Weakness    ED Discharge Orders    None       Robinson, [place] N, PA-C 06/09/18 1601    Tilden Fossa, MD 06/10/18 [phone]

## 2018-06-09 NOTE — ED Provider Notes (Signed)
[facility]-EMERGENCY DEPT Provider Note   CSN: [id number] Arrival date & time: 06/09/18  1952     History   Chief Complaint Chief Complaint  Patient presents with  . Nausea    HPI Collin White is a 22 y.o. male.  Patient with a history of paranoid schizophrenia, well known to the ED with 33 previous visits in the last 6 months, including earlier today, presents with nausea. He reports it started today and he has the persistent feeling he is going to start vomiting but has not vomited. No diarrhea.   The history is provided by the patient. No language interpreter was used.    Past Medical History:  Diagnosis Date  . Migraines   . Psychoses (HCC)   . Schizophrenia (HCC)   . Seizures [name])     Patient Active Problem List   Diagnosis Date Noted  . Acute episode of schizophrenia with history of multiple episodes (HCC) 06/30/2017  . Schizophrenia, paranoid type (HCC) 06/08/2017  . 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 Psychoses (HCC)   . Paranoid schizophrenia (HCC) 09/29/2015  . Cannabis use disorder, moderate, in early remission (HCC) 09/26/2015  . History of posttraumatic stress disorder (PTSD) 09/26/2015    Past Surgical History:  Procedure Laterality Date  . abd surgery s/p traumatic event    . facial reconstructive surgery    . NO PAST SURGERIES  patient stated he had facial reconstruction surgery as a child   . tumor removed from head           Home Medications    Prior to Admission medications   Medication Sig Start Date End Date Taking? Authorizing Provider  cephALEXin (KEFLEX) 500 MG capsule Take 1 capsule (500 mg total) by mouth 4 (four) times daily. Patient not taking: Reported on 05/19/2018 05/01/18   Geoffery Lyons, MD  citalopram (CELEXA) 20 MG tablet Take 1 tablet (20 mg total) by mouth daily. Patient not taking: Reported on 05/01/2018 04/23/18   Laveda Abbe, NP  HYDROcodone-acetaminophen [facility]) 5-325 MG  tablet Take 1-2 tablets by mouth every 6 (six) hours as needed. Patient not taking: Reported on 05/19/2018 05/01/18   Geoffery Lyons, MD  ondansetron [name] ODT) 8 MG disintegrating tablet 8mg  ODT q8 hours prn nausea Patient not taking: Reported on 06/08/2018 05/20/18   Palumbo, April, MD  paliperidone (INVEGA) 9 MG 24 hr tablet Take 1 tablet (9 mg total) by mouth daily. Patient not taking: Reported on 05/01/2018 04/23/18   Laveda Abbe, NP    Family History Family History  Problem Relation Age of Onset  . Hypertension Other   . Mental illness Neg Hx     Social History Social History   Tobacco Use  . Smoking status: Former Smoker    Types: Cigarettes  . Smokeless tobacco: Never Used  Substance Use Topics  . Alcohol use: No  . Drug use: No     Allergies   Patient has no known allergies.   Review of Systems Review of Systems  Constitutional: Negative for chills and fever.  HENT: Negative.   Respiratory: Negative.   Cardiovascular: Negative.   Gastrointestinal: Positive for nausea. Negative for vomiting.  Musculoskeletal: Negative.   Skin: Negative.   Neurological: Negative.      Physical Exam Updated Vital Signs BP 101/80 (BP Location: Right Arm)   Pulse 82   Temp 98.4 F (36.9 C) (Oral)   Resp 16   SpO2 100%   Physical Exam  Constitutional: He  is oriented to person, place, and time. He appears well-developed and well-nourished.  HENT:  Head: Normocephalic.  Neck: Normal range of motion. Neck supple.  Cardiovascular: Normal rate and regular rhythm.  Pulmonary/Chest: Effort normal and breath sounds normal. He has no wheezes. He has no rales.  Abdominal: Soft. Bowel sounds are normal.  Soft abdomen, diffusely tender. BS normoactive in all quadrants.   Musculoskeletal: Normal range of motion.  Neurological: He is alert and oriented to person, place, and time.  Skin: Skin is warm and dry. No rash noted.  Psychiatric: He has a normal mood and affect.      ED Treatments / Results  Labs (all labs ordered are listed, but only abnormal results are displayed) Labs Reviewed - No data to display  EKG None  Radiology Dg Chest 2 View  Result Date: 06/08/2018 CLINICAL DATA:  Shortness of breath.  Bilateral leg weakness. EXAM: CHEST - 2 VIEW COMPARISON:  05/20/2018 FINDINGS: The cardiomediastinal contours are normal. The lungs are clear. Pulmonary vasculature is normal. No consolidation, pleural effusion, or pneumothorax. No acute osseous abnormalities are seen. IMPRESSION: Negative radiographs of chest. Electronically Signed   By: Narda Rutherford M.D.   On: 06/08/2018 03:12    Procedures Procedures (including critical care time)  Medications Ordered in ED Medications  ondansetron (ZOFRAN-ODT) disintegrating tablet 4 mg (4 mg Oral Given 06/09/18 2309)     Initial Impression / Assessment and Plan / ED Course  I have reviewed the triage vital signs and the nursing notes.  Pertinent labs & imaging results that were available during my care of the patient were reviewed by me and considered in my medical decision making (see chart for details).     Patient returns to ED for second time today with new complaint of nausea. Seen here earlier for SOB which is no longer a complaint. No vomiting.   The patient is well appearing. No vomiting during his 4 hour current ED visit. Zofran provided. VSS, no tachycardia. Blood pressure mildly low but consistent with documented history.   He has a history of schizophrenia, paranoid type. He does not appear to be responding to internal stimuli. He has been appropriately responsive to questions and allowing exam. He has been calm and cooperative at all times.   He can be discharged home.   Final Clinical Impressions(s) / ED Diagnoses   Final diagnoses:  None   1. Nausea without vomiting  ED Discharge Orders    None       Elpidio Anis, Cordelia Poche 06/09/18 2336    Arby Barrette,  MD 06/10/18 Jacinta Shoe

## 2018-06-09 NOTE — ED Triage Notes (Signed)
Patient reports difficulty breathing, general pain and stiffness. He also reports he fell on the bed earlier today into a deep sleep and was difficult to wake.

## 2018-06-09 NOTE — ED Notes (Signed)
When medication was offered to the patient he said "I'm not doing this." He then left.

## 2018-06-10 ENCOUNTER — Emergency Department (HOSPITAL_COMMUNITY): Payer: Self-pay

## 2018-06-10 ENCOUNTER — Emergency Department (HOSPITAL_COMMUNITY)
Admission: EM | Admit: 2018-06-10 | Discharge: 2018-06-11 | Disposition: A | Discharge status: Home / Self Care | Payer: Self-pay | Attending: Emergency Medicine | Admitting: Emergency Medicine

## 2018-06-10 ENCOUNTER — Encounter (HOSPITAL_COMMUNITY): Payer: Self-pay | Admitting: *Deleted

## 2018-06-10 DIAGNOSIS — M791 Myalgia, unspecified site: Secondary | ICD-10-CM | POA: Insufficient documentation

## 2018-06-10 DIAGNOSIS — R51 Headache: Secondary | ICD-10-CM | POA: Insufficient documentation

## 2018-06-10 DIAGNOSIS — Z87891 Personal history of nicotine dependence: Secondary | ICD-10-CM | POA: Insufficient documentation

## 2018-06-10 LAB — CBC WITH DIFFERENTIAL/PLATELET
Abs Immature Granulocytes: 0.02 10*3/uL (ref 0.00–0.07)
BASOS PCT: 1 %
Basophils Absolute: 0.1 10*3/uL (ref 0.0–0.1)
EOS ABS: 0.1 10*3/uL (ref 0.0–0.5)
Eosinophils Relative: 1 %
HCT: 39.5 % (ref 39.0–52.0)
Hemoglobin: 12.5 g/dL — ABNORMAL LOW (ref 13.0–17.0)
Immature Granulocytes: 0 %
Lymphocytes Relative: 28 %
Lymphs Abs: 1.8 10*3/uL (ref 0.7–4.0)
MCH: 27.8 pg (ref 26.0–34.0)
MCHC: 31.6 g/dL (ref 30.0–36.0)
MCV: 88 fL (ref 80.0–100.0)
MONOS PCT: 8 %
Monocytes Absolute: 0.5 10*3/uL (ref 0.1–1.0)
NEUTROS PCT: 62 %
Neutro Abs: 3.9 10*3/uL (ref 1.7–7.7)
PLATELETS: 189 10*3/uL (ref 150–400)
RBC: 4.49 MIL/uL (ref 4.22–5.81)
RDW: 13.4 % (ref 11.5–15.5)
WBC: 6.3 10*3/uL (ref 4.0–10.5)
nRBC: 0 % (ref 0.0–0.2)

## 2018-06-10 LAB — COMPREHENSIVE METABOLIC PANEL
ALT: 10 U/L (ref 0–44)
ANION GAP: 9 (ref 5–15)
AST: 21 U/L (ref 15–41)
Albumin: 4.1 g/dL (ref 3.5–5.0)
Alkaline Phosphatase: 74 U/L (ref 38–126)
BUN: 17 mg/dL (ref 6–20)
CHLORIDE: 107 mmol/L (ref 98–111)
CO2: 26 mmol/L (ref 22–32)
Calcium: 9.2 mg/dL (ref 8.9–10.3)
Creatinine, Ser: 0.97 mg/dL (ref 0.61–1.24)
GFR calc Af Amer: >60 mL/min (ref >60)
GFR calc non Af Amer: >60 mL/min (ref >60)
GLUCOSE: 83 mg/dL (ref 70–99)
POTASSIUM: 3.7 mmol/L (ref 3.5–5.1)
Sodium: 142 mmol/L (ref 135–145)
TOTAL PROTEIN: 7.4 g/dL (ref 6.5–8.1)
Total Bilirubin: 0.7 mg/dL (ref 0.3–1.2)

## 2018-06-10 MED ORDER — IBUPROFEN 800 MG PO TABS
800.0000 mg | ORAL_TABLET | Freq: Once | ORAL | Status: AC
  Administered 2018-06-10: 800 mg via ORAL
  Filled 2018-06-10: qty 1

## 2018-06-10 NOTE — ED Triage Notes (Signed)
Pt complains of pain in his right lung when walking and pain in his forehead. Pt is hard to understand in triage.

## 2018-06-10 NOTE — Discharge Instructions (Signed)
Take over the counter medications as needed for pain, follow up with a primary care doctor °

## 2018-06-10 NOTE — ED Provider Notes (Signed)
[facility]-EMERGENCY DEPT Provider Note   CSN: [id number] Arrival date & time: 06/10/18  1648     History   Chief Complaint Chief Complaint  Patient presents with  . Headache    HPI Collin White is a 22 y.o. male. Identifies as male  HPI Patient presents to the emergency room with complaints of pain in her right lung.  Pt states she feels sick in her lung.  Touching her face makes it hard for her to breathe. She has a headache as well.  She feels stiff and achy.  She denies any fever.  No cough Past Medical History:  Diagnosis Date  . Migraines   . Psychoses (HCC)   . Schizophrenia (HCC)   . Seizures [facility])     Patient Active Problem List   Diagnosis Date Noted  . Acute episode of schizophrenia with history of multiple episodes (HCC) 06/30/2017  . Schizophrenia, paranoid type (HCC) 06/08/2017  . 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 Psychoses (HCC)   . Paranoid schizophrenia (HCC) 09/29/2015  . Cannabis use disorder, moderate, in early remission (HCC) 09/26/2015  . History of posttraumatic stress disorder (PTSD) 09/26/2015    Past Surgical History:  Procedure Laterality Date  . abd surgery s/p traumatic event    . facial reconstructive surgery    . NO PAST SURGERIES  patient stated he had facial reconstruction surgery as a child   . tumor removed from head           Home Medications    Prior to Admission medications   Not on File    Family History Family History  Problem Relation Age of Onset  . Hypertension Other   . Mental illness Neg Hx     Social History Social History   Tobacco Use  . Smoking status: Former Smoker    Types: Cigarettes  . Smokeless tobacco: Never Used  Substance Use Topics  . Alcohol use: No  . Drug use: No     Allergies   Patient has no known allergies.   Review of Systems Review of Systems  All other systems reviewed and are negative.    Physical Exam Updated Vital  Signs BP 118/60 (BP Location: Left Arm)   Pulse 78   Temp 98.6 F (37 C) (Oral)   Resp 12   SpO2 100%   Physical Exam  Constitutional:  Non-toxic appearance. He does not appear ill. No distress.  HENT:  Head: Normocephalic and atraumatic.  Right Ear: External ear normal.  Left Ear: External ear normal.  Eyes: Conjunctivae are normal. Right eye exhibits no discharge. Left eye exhibits no discharge. No scleral icterus.  Neck: Neck supple. No tracheal deviation present.  Cardiovascular: Normal rate, regular rhythm and intact distal pulses.  Pulmonary/Chest: Effort normal and breath sounds normal. No stridor. No respiratory distress. He has no wheezes. He has no rales.  Abdominal: Soft. Bowel sounds are normal. He exhibits no distension. There is no tenderness. There is no rebound and no guarding.  Musculoskeletal: He exhibits no edema or tenderness.  Neurological: He is alert. He has normal strength. No cranial nerve deficit (no facial droop, extraocular movements intact, no slurred speech) or sensory deficit. He exhibits normal muscle tone. He displays no seizure activity. Coordination normal.  Skin: Skin is warm and dry. No rash noted.  Psychiatric: His affect is blunt. His speech is not slurred. He is withdrawn.  Speech very soft, mumbles, and when asked to repeat himself will be clear  and intelligible  Nursing note and vitals reviewed.    ED Treatments / Results  Labs (all labs ordered are listed, but only abnormal results are displayed) Labs Reviewed  CBC WITH DIFFERENTIAL/PLATELET - Abnormal; Notable for the following components:      Result Value   Hemoglobin 12.5 (*)    All other components within normal limits  COMPREHENSIVE METABOLIC PANEL    EKG None  Radiology Dg Chest 2 View  Result Date: 06/10/2018 CLINICAL DATA:  Right-sided rib pain EXAM: CHEST - 2 VIEW COMPARISON:  06/08/2018, 05/20/2018 FINDINGS: The heart size and mediastinal contours are within normal  limits. Both lungs are clear. The visualized skeletal structures are unremarkable. IMPRESSION: No active cardiopulmonary disease. Electronically Signed   By: Jasmine Pang M.D.   On: 06/10/2018 22:38    Procedures Procedures (including critical care time)  Medications Ordered in ED Medications  ibuprofen (ADVIL,MOTRIN) tablet 800 mg (800 mg Oral Given 06/10/18 2238)     Initial Impression / Assessment and Plan / ED Course  I have reviewed the triage vital signs and the nursing notes.  Pertinent labs & imaging results that were available during my care of the patient were reviewed by me and considered in my medical decision making (see chart for details).   Patient presents with various somatic complaints.  Patient complains of throwing up from the right lung.  Is difficult to get the patient to clarify whether they are coughing or vomiting.  Patient seems adamant that nothing is wrong with the right lung.  Patient is breathing easily.  Oxygenation is normal.  Chest x-ray is normal.  No findings to suggest pulmonary embolism.  Patient has a history of chronic mental health problems and there is likely some component of somatization.  This is the patient's 34th visit to the ED in the last 6 months.  At this time there does not appear to be any evidence of an acute emergency medical condition and the patient appears stable for discharge with appropriate outpatient follow up.   Final Clinical Impressions(s) / ED Diagnoses   Final diagnoses:  Myalgia    ED Discharge Orders    None       Linwood Dibbles, MD 06/10/18 2349

## 2018-06-11 ENCOUNTER — Other Ambulatory Visit: Payer: Self-pay

## 2018-06-11 ENCOUNTER — Emergency Department (HOSPITAL_COMMUNITY)
Admission: EM | Admit: 2018-06-11 | Discharge: 2018-06-11 | Disposition: A | Discharge status: Home / Self Care | Payer: Self-pay | Attending: Emergency Medicine | Admitting: Emergency Medicine

## 2018-06-11 ENCOUNTER — Encounter (HOSPITAL_COMMUNITY): Payer: Self-pay | Admitting: Emergency Medicine

## 2018-06-11 DIAGNOSIS — R0789 Other chest pain: Secondary | ICD-10-CM | POA: Insufficient documentation

## 2018-06-11 DIAGNOSIS — R6889 Other general symptoms and signs: Secondary | ICD-10-CM

## 2018-06-11 MED ORDER — ACETAMINOPHEN 325 MG PO TABS
650.0000 mg | ORAL_TABLET | Freq: Once | ORAL | Status: AC
  Administered 2018-06-11: 650 mg via ORAL
  Filled 2018-06-11: qty 2

## 2018-06-11 NOTE — Discharge Instructions (Signed)
You are seen in the emergency department today due to concern regarding your lung being sick.  Your work-up in the ER from yesterday was reassuring.  Today you have a very reassuring physical exam.   We would like you to follow-up with a primary care provider, if you do not have a primary care provider please call the phone number circled in your discharge instructions to follow-up with the [facility] or the [facility] also provided your discharge instructions.  Return to the ER for new or worsening symptoms or any other concerns.

## 2018-06-11 NOTE — ED Provider Notes (Signed)
[facility]-EMERGENC[facility] DEPT Provider Note   CSN: [id number] Arrival date & time: 06/11/18  1325     History   Chief Complaint Right lung is "sick"   HPI Collin White is a 22 y.o. male with a hx of schizophrenia, psychosis, PTSD, and male to male transgender who returns to the ED with chief complaint of right lung being "sick". Patient reports she just does not feel well. States sxs were exacerbated after drinking milk today. Has not tried medicines at home. Has been seen in the ED several times recently for similar and has not followed up with PCP. Triage note mentions nausea, patient denies this to me. Denies dyspnea, cough, leg pain/swelling, hemoptysis, recent surgery/trauma, recent long travel, hormone use, personal hx of cancer, or hx of DVT/PE. He denies URI sxs. Denies abdominal pain, vomiting, or diarrhea. Denies illicit drug use.    HPI  Past Medical History:  Diagnosis Date  . Migraines   . Psychoses (HCC)   . Schizophrenia (HCC)   . Seizures [facility])     Patient Active Problem List   Diagnosis Date Noted  . Acute episode of schizophrenia with history of multiple episodes (HCC) 06/30/2017  . Schizophrenia, paranoid type (HCC) 06/08/2017  . 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 Psychoses (HCC)   . Paranoid schizophrenia (HCC) 09/29/2015  . Cannabis use disorder, moderate, in early remission (HCC) 09/26/2015  . History of posttraumatic stress disorder (PTSD) 09/26/2015    Past Surgical History:  Procedure Laterality Date  . abd surgery s/p traumatic event    . facial reconstructive surgery    . NO PAST SURGERIES  patient stated he had facial reconstruction surgery as a child   . tumor removed from head           Home Medications    Prior to Admission medications   Not on File    Family History Family History  Problem Relation Age of Onset  . Hypertension Other   . Mental illness Neg Hx     Social History Social  History   Tobacco Use  . Smoking status: Former Smoker    Types: Cigarettes  . Smokeless tobacco: Never Used  Substance Use Topics  . Alcohol use: No  . Drug use: No     Allergies   Patient has no known allergies.   Review of Systems Review of Systems  Constitutional: Negative for chills and fever.  HENT: Negative for congestion, ear pain and sore throat.   Respiratory: Negative for cough and shortness of breath.   Cardiovascular: Negative for leg swelling.       Positive for R lung pain and being "sick"  Gastrointestinal: Negative for abdominal pain, diarrhea and vomiting.  Musculoskeletal: Negative for myalgias.  Neurological: Negative for syncope.    Physical Exam Updated Vital Signs BP 111/83   Pulse 77   Temp (!) 97.4 F (36.3 C)   Resp 17   Ht 6' (1.829 m)   Wt 63.5 kg   SpO2 100%   BMI 18.99 kg/m   Physical Exam  Constitutional: He appears well-developed and well-nourished.  Non-toxic appearance. No distress.  HENT:  Head: Normocephalic and atraumatic.  Right Ear: Tympanic membrane is not perforated, not erythematous, not retracted and not bulging.  Left Ear: Tympanic membrane is not perforated, not erythematous, not retracted and not bulging.  Nose: Nose normal.  Mouth/Throat: Uvula is midline and oropharynx is clear and moist. No oropharyngeal exudate or posterior oropharyngeal erythema.  Eyes:  Conjunctivae are normal. Right eye exhibits no discharge. Left eye exhibits no discharge.  Neck: Normal range of motion. Neck supple.  Cardiovascular: Normal rate and regular rhythm.  No murmur heard. Pulses:      Radial pulses are 2+ on the right side, and 2+ on the left side.  Pulmonary/Chest: Effort normal and breath sounds normal. No respiratory distress. He has no wheezes. He has no rhonchi. He has no rales. He exhibits tenderness (mild right anterior chest wall tenderness to palpation without overlying skin changes or palapble crepitus/deformity).    Respiration even and unlabored  Abdominal: Soft. He exhibits no distension. There is no tenderness.  Musculoskeletal: He exhibits no edema or tenderness.  Lymphadenopathy:    He has no cervical adenopathy.  Neurological: He is alert.  Mumbles frequently throughout evaluation when responding to questions. Does not appear to be responding to internal stimuli at present.    Skin: Skin is warm and dry. No rash noted.  Psychiatric: He has a normal mood and affect. His behavior is normal. Thought content normal.  Nursing note and vitals reviewed.    ED Treatments / Results  Labs (all labs ordered are listed, but only abnormal results are displayed) Labs Reviewed - No data to display  EKG None  Radiology Dg Chest 2 View  Result Date: 06/10/2018 CLINICAL DATA:  Right-sided rib pain EXAM: CHEST - 2 VIEW COMPARISON:  06/08/2018, 05/20/2018 FINDINGS: The heart size and mediastinal contours are within normal limits. Both lungs are clear. The visualized skeletal structures are unremarkable. IMPRESSION: No active cardiopulmonary disease. Electronically Signed   By: Jasmine Pang M.D.   On: 06/10/2018 22:38    Procedures Procedures (including critical care time)  Medications Ordered in ED Medications  acetaminophen (TYLENOL) tablet 650 mg (has no administration in time range)     Initial Impression / Assessment and Plan / ED Course  I have reviewed the triage vital signs and the nursing notes.  Pertinent labs & imaging results that were available during my care of the patient were reviewed by me and considered in my medical decision making (see chart for details).    Patient well-known to the emergency department presenting with right lung feeling "sick." Seen in the ED 35 times in the past 6 months, seen for similar chief complaint 3 times in the past 3 days. On exam today patient is well appearing. Lungs are CTA. No evidence of respiratory distress. Some reproducibility of discomfort  with chest wall palpation- no crepitus/deformities or overlying skin changes noted. CXR from visit yesterday has been reviewed and is without infiltrate to suggest pneumonia, pneumothorax, edema, effusion, or fracture/dislocation. Yesterday's visit blood work reviewed and fairly unremarkable as well, no electrolyte disturbance, hgb consistent with previous, no leukocytosis.  PERC negative doubt PE, Given lack of risk factors and reproducibility w/ palpation do not suspect ACS. Ambulatory and tolerating PO in the ER today. Given reassuring evaluation, I do not believe further emergent work-up is indicated.  She has been instructed to follow-up with a primary care doctor regarding his visit today- resources were provided.  I discussed  need for PCP follow-up and return precautions with the patient. Provided opportunity for questions, patient confirmed understanding. Discharged in stable condition.   Final Clinical Impressions(s) / ED Diagnoses   Final diagnoses:  Feels sick    ED Discharge Orders    None       Cherly Anderson, PA-C 06/11/18 1434    Virgina Norfolk, DO 06/11/18 1506

## 2018-06-11 NOTE — ED Triage Notes (Addendum)
Patient c/o nausea and right lung pain after drinking milk today. Denies V/D.

## 2018-06-11 NOTE — ED Notes (Signed)
Patient became angry and aggravated during the discharge process. When follow up information was reviewed with the patient be became more upset stating he couldn't follow up because "it's illegal" and "they won't let me!" All attempts to educate were unsuccessful.

## 2018-07-22 ENCOUNTER — Other Ambulatory Visit: Payer: Self-pay

## 2018-07-22 ENCOUNTER — Emergency Department (HOSPITAL_COMMUNITY): Payer: Self-pay

## 2018-07-22 ENCOUNTER — Emergency Department (HOSPITAL_COMMUNITY)
Admission: EM | Admit: 2018-07-22 | Discharge: 2018-07-22 | Disposition: A | Discharge status: Home / Self Care | Payer: Self-pay | Attending: Emergency Medicine | Admitting: Emergency Medicine

## 2018-07-22 ENCOUNTER — Encounter (HOSPITAL_COMMUNITY): Payer: Self-pay

## 2018-07-22 DIAGNOSIS — R05 Cough: Secondary | ICD-10-CM

## 2018-07-22 DIAGNOSIS — R112 Nausea with vomiting, unspecified: Secondary | ICD-10-CM | POA: Insufficient documentation

## 2018-07-22 DIAGNOSIS — R059 Cough, unspecified: Secondary | ICD-10-CM

## 2018-07-22 DIAGNOSIS — Z87891 Personal history of nicotine dependence: Secondary | ICD-10-CM | POA: Insufficient documentation

## 2018-07-22 LAB — CBC WITH DIFFERENTIAL/PLATELET
Abs Immature Granulocytes: 0.01 10*3/uL (ref 0.00–0.07)
BASOS ABS: 0 10*3/uL (ref 0.0–0.1)
Basophils Relative: 0 %
EOS PCT: 0 %
Eosinophils Absolute: 0 10*3/uL (ref 0.0–0.5)
HEMATOCRIT: 41.9 % (ref 39.0–52.0)
HEMOGLOBIN: 13.7 g/dL (ref 13.0–17.0)
Immature Granulocytes: 0 %
LYMPHS ABS: 1.5 10*3/uL (ref 0.7–4.0)
LYMPHS PCT: 23 %
MCH: 28.5 pg (ref 26.0–34.0)
MCHC: 32.7 g/dL (ref 30.0–36.0)
MCV: 87.1 fL (ref 80.0–100.0)
MONO ABS: 0.5 10*3/uL (ref 0.1–1.0)
MONOS PCT: 8 %
Neutro Abs: 4.3 10*3/uL (ref 1.7–7.7)
Neutrophils Relative %: 69 %
Platelets: 145 10*3/uL — ABNORMAL LOW (ref 150–400)
RBC: 4.81 MIL/uL (ref 4.22–5.81)
RDW: 12.5 % (ref 11.5–15.5)
WBC: 6.4 10*3/uL (ref 4.0–10.5)
nRBC: 0 % (ref 0.0–0.2)

## 2018-07-22 LAB — COMPREHENSIVE METABOLIC PANEL
ALBUMIN: 4.9 g/dL (ref 3.5–5.0)
ALK PHOS: 74 U/L (ref 38–126)
ALT: 11 U/L (ref 0–44)
ANION GAP: 8 (ref 5–15)
AST: 24 U/L (ref 15–41)
BILIRUBIN TOTAL: 0.7 mg/dL (ref 0.3–1.2)
BUN: 14 mg/dL (ref 6–20)
CALCIUM: 9.7 mg/dL (ref 8.9–10.3)
CO2: 28 mmol/L (ref 22–32)
CREATININE: 1.01 mg/dL (ref 0.61–1.24)
Chloride: 106 mmol/L (ref 98–111)
GFR calc non Af Amer: >60 mL/min (ref >60)
Glucose, Bld: 93 mg/dL (ref 70–99)
POTASSIUM: 4.3 mmol/L (ref 3.5–5.1)
Sodium: 142 mmol/L (ref 135–145)
Total Protein: 8.3 g/dL — ABNORMAL HIGH (ref 6.5–8.1)

## 2018-07-22 LAB — LIPASE, BLOOD: Lipase: 32 U/L (ref 11–51)

## 2018-07-22 MED ORDER — ONDANSETRON 4 MG PO TBDP: 4.0000 mg | ORAL_TABLET | Freq: Three times a day (TID) | ORAL | 0 refills | Status: DC | PRN

## 2018-07-22 MED ORDER — ONDANSETRON HCL 4 MG/2ML IJ SOLN
4.0000 mg | Freq: Once | INTRAMUSCULAR | Status: AC
  Administered 2018-07-22: 4 mg via INTRAVENOUS
  Filled 2018-07-22: qty 2

## 2018-07-22 NOTE — ED Provider Notes (Signed)
[facility]-EMERGENCY DEPT Provider Note   CSN: [id number] Arrival date & time: 07/22/18  1803     History   Chief Complaint Chief Complaint  Patient presents with  . Emesis    HPI Collin White is a 22 y.o. male.  Patient presents to the emergency department with a chief complaint of right upper quadrant abdominal pain x5 days.  Pain is been worsening.  Reports associated nausea and vomiting.  Denies any fevers chills, or diarrhea.  No treatments prior to arrival.  Also reports productive cough.  Pain does not radiate.  The history is provided by the patient. No language interpreter was used.    Past Medical History:  Diagnosis Date  . Migraines   . Psychoses (HCC)   . Schizophrenia (HCC)   . Seizures [name](HCC)     Patient Active Problem List   Diagnosis Date Noted  . Acute episode of schizophrenia with history of multiple episodes (HCC) 06/30/2017  . Schizophrenia, paranoid type (HCC) 06/08/2017  . 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 Psychoses (HCC)   . Paranoid schizophrenia (HCC) 09/29/2015  . Cannabis use disorder, moderate, in early remission (HCC) 09/26/2015  . History of posttraumatic stress disorder (PTSD) 09/26/2015    Past Surgical History:  Procedure Laterality Date  . abd surgery s/p traumatic event    . facial reconstructive surgery    . NO PAST SURGERIES  patient stated he had facial reconstruction surgery as a child   . tumor removed from head           Home Medications    Prior to Admission medications   Not on File    Family History Family History  Problem Relation Age of Onset  . Hypertension Other   . Mental illness Neg Hx     Social History Social History   Tobacco Use  . Smoking status: Former Smoker    Types: Cigarettes  . Smokeless tobacco: Never Used  Substance Use Topics  . Alcohol use: No  . Drug use: No     Allergies   Patient has no known allergies.   Review of  Systems Review of Systems  All other systems reviewed and are negative.    Physical Exam Updated Vital Signs BP 123/85   Pulse 76   Temp (!) 97.5 F (36.4 C) (Oral)   Resp 16   Ht 6' (1.829 m)   Wt 63.5 kg   SpO2 100%   BMI 18.99 kg/m   Physical Exam  Constitutional: He is oriented to person, place, and time. He appears well-developed and well-nourished.  HENT:  Head: Normocephalic and atraumatic.  Eyes: Pupils are equal, round, and reactive to light. Conjunctivae and EOM are normal. Right eye exhibits no discharge. Left eye exhibits no discharge. No scleral icterus.  Neck: Normal range of motion. Neck supple. No JVD present.  Cardiovascular: Normal rate, regular rhythm and normal heart sounds. Exam reveals no gallop and no friction rub.  No murmur heard. Pulmonary/Chest: Effort normal and breath sounds normal. No respiratory distress. He has no wheezes. He has no rales. He exhibits no tenderness.  Abdominal: Soft. He exhibits no distension and no mass. There is tenderness. There is no rebound and no guarding.  Generalized epigastric tenderness no Murphy sign, no McBurney point tenderness  Musculoskeletal: Normal range of motion. He exhibits no edema or tenderness.  Neurological: He is alert and oriented to person, place, and time.  Skin: Skin is warm and dry.  Psychiatric: He  has a normal mood and affect. His behavior is normal. Judgment and thought content normal.  Nursing note and vitals reviewed.    ED Treatments / Results  Labs (all labs ordered are listed, but only abnormal results are displayed) Labs Reviewed  COMPREHENSIVE METABOLIC PANEL - Abnormal; Notable for the following components:      Result Value   Total Protein 8.3 (*)    All other components within normal limits  CBC WITH DIFFERENTIAL/PLATELET - Abnormal; Notable for the following components:   Platelets 145 (*)    All other components within normal limits  LIPASE, BLOOD  CBC WITH  DIFFERENTIAL/PLATELET    EKG None  Radiology Dg Chest 2 View  Result Date: 07/22/2018 CLINICAL DATA:  Right-sided chest pain and vomiting. EXAM: CHEST - 2 VIEW COMPARISON:  06/10/2018 FINDINGS: The heart size and mediastinal contours are within normal limits. Both lungs are clear. The visualized upper abdominal bowel gas pattern is unremarkable. Moderate stool in the visualized colon. The visualized skeletal structures are unremarkable. IMPRESSION: Normal chest x-ray. Electronically Signed   By: Rudie Meyer M.D.   On: 07/22/2018 19:36    Procedures Procedures (including critical care time)  Medications Ordered in ED Medications  ondansetron (ZOFRAN) injection 4 mg (has no administration in time range)     Initial Impression / Assessment and Plan / ED Course  I have reviewed the triage vital signs and the nursing notes.  Pertinent labs & imaging results that were available during my care of the patient were reviewed by me and considered in my medical decision making (see chart for details).     Patient with right upper abdominal tenderness.  Has had increasing pain for the past 5 days.  Reports associated nausea and vomiting.  Will check labs and reassess.  Laboratory work-up is reassuring.  Normal LFTs, no right upper quadrant tenderness or Murphy sign, I believe patient's abdominal discomfort to be secondary to retching.  He is tolerating p.o.  Will discharge home with Zofran.  Return precautions given.  Patient understands agrees with plan.  He is stable and ready for discharge.  Final Clinical Impressions(s) / ED Diagnoses   Final diagnoses:  Nausea and vomiting, intractability of vomiting not specified, unspecified vomiting type    ED Discharge Orders         Ordered    ondansetron (ZOFRAN ODT) 4 MG disintegrating tablet  Every 8 hours PRN     07/22/18 2136           Roxy Horseman, PA-C 07/22/18 2137    Long, Arlyss Repress, MD 07/23/18 [phone]

## 2018-07-22 NOTE — ED Triage Notes (Addendum)
Pt states that he is having right sided abdominal pain and has had emesis. Pt states that every time he tries to eat something it comes back up. Pt states he has had the abd pain x 5 days

## 2018-08-03 ENCOUNTER — Encounter (HOSPITAL_COMMUNITY): Payer: Self-pay | Admitting: Emergency Medicine

## 2018-08-03 ENCOUNTER — Emergency Department (HOSPITAL_COMMUNITY)
Admission: EM | Admit: 2018-08-03 | Discharge: 2018-08-03 | Disposition: A | Discharge status: Home / Self Care | Payer: Self-pay | Attending: Emergency Medicine | Admitting: Emergency Medicine

## 2018-08-03 ENCOUNTER — Other Ambulatory Visit: Payer: Self-pay

## 2018-08-03 DIAGNOSIS — Z590 Homelessness: Secondary | ICD-10-CM | POA: Insufficient documentation

## 2018-08-03 DIAGNOSIS — K602 Anal fissure, unspecified: Secondary | ICD-10-CM | POA: Insufficient documentation

## 2018-08-03 DIAGNOSIS — Z87891 Personal history of nicotine dependence: Secondary | ICD-10-CM | POA: Insufficient documentation

## 2018-08-03 DIAGNOSIS — K6289 Other specified diseases of anus and rectum: Secondary | ICD-10-CM

## 2018-08-03 NOTE — ED Triage Notes (Signed)
Patient c/o burning to rectum since BM yesterday. Denies bleeding.

## 2018-08-03 NOTE — Discharge Instructions (Signed)
Be sure to clean your buttock and anal area well each time you have a bowel movement.

## 2018-08-03 NOTE — ED Provider Notes (Signed)
[facility]-EMERG[facility] DEPT Provider Note   CSN: [id number] Arrival date & time: 08/03/18  1746     History   Chief Complaint Chief Complaint  Patient presents with  . Rectal Pain    HPI Collin White is a 22 y.o. adult has had 23 visits in the past 6 months and is homeless with hx of Psychoses, Schizophrenia and Seizures presents to the ED with c/o burning in the rectal area. Patient reports that after he had a BM yesterday he started burning and it has continued.  HPI  Past Medical History:  Diagnosis Date  . Migraines   . Psychoses (HCC)   . Schizophrenia (HCC)   . Seizures [facility])     Patient Active Problem List   Diagnosis Date Noted  . Acute episode of schizophrenia with history of multiple episodes (HCC) 06/30/2017  . Schizophrenia, paranoid type (HCC) 06/08/2017  . 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 Psychoses (HCC)   . Paranoid schizophrenia (HCC) 09/29/2015  . Cannabis use disorder, moderate, in early remission (HCC) 09/26/2015  . History of posttraumatic stress disorder (PTSD) 09/26/2015    Past Surgical History:  Procedure Laterality Date  . abd surgery s/p traumatic event    . facial reconstructive surgery    . NO PAST SURGERIES  patient stated he had facial reconstruction surgery as a child   . tumor removed from head           Home Medications    Prior to Admission medications   Medication Sig Start Date End Date Taking? Authorizing Provider  ondansetron (ZOFRAN ODT) 4 MG disintegrating tablet Take 1 tablet (4 mg total) by mouth every 8 (eight) hours as needed for nausea or vomiting. 07/22/18   Roxy Horseman, PA-C    Family History Family History  Problem Relation Age of Onset  . Hypertension Other   . Mental illness Neg Hx     Social History Social History   Tobacco Use  . Smoking status: Former Smoker    Types: Cigarettes  . Smokeless tobacco: Never Used  Substance Use Topics  . Alcohol use: No   . Drug use: No     Allergies   Patient has no known allergies.   Review of Systems Review of Systems  Gastrointestinal:       Burning to the anal area and buttocks.     Physical Exam Updated Vital Signs BP (!) 119/91   Pulse 98   Temp 98.4 F (36.9 C)   Resp 17   SpO2 100%   Physical Exam Vitals signs and nursing note reviewed.  Constitutional:      General: She is not in acute distress.    Appearance: She is well-developed.  Neck:     Musculoskeletal: Neck supple.  Cardiovascular:     Rate and Rhythm: Normal rate.  Pulmonary:     Effort: Pulmonary effort is normal.  Genitourinary:    Comments: Dried stool bilateral buttock, moist soft stool in and around the anal area. No lesions noted. Skin irritation where stool has dried. Rectal exam, normal tone, no mass palpated. There is a small tear noted to the anus. No bleeding. Musculoskeletal: Normal range of motion.  Skin:    General: Skin is warm and dry.  Neurological:     Mental Status: She is alert and oriented to person, place, and time.     Comments: Patient speaks soft and mumbles but when asked to repeat he speaks louder and you can understand  what he says.       ED Treatments / Results  Labs (all labs ordered are listed, but only abnormal results are displayed) Labs Reviewed - No data to display Radiology No results found.  Procedures Procedures (including critical care time)  Medications Ordered in ED Medications - No data to display   Initial Impression / Assessment and Plan / ED Course  I have reviewed the triage vital signs and the nursing notes. 22 y.o. male with multiple ED visits here tonight with c/o anal burning since BM yesterday. Patient taken to the shower and was able to clean self. Patient given clothes and coat. Re examined after patient cleaned. No signs of skin infection. Discussed with the patient small anal tear and fu with  and Wellness. Patient stable for d/c.    Final Clinical Impressions(s) / ED Diagnoses   Final diagnoses:  Anal fissure  Anal or rectal pain  Perirectal skin irritation    ED Discharge Orders    None       Kerrie Buffaloeese, Hope MetamoraM, NP 08/03/18 2222    Doug SouJacubowitz, Sam, MD 08/03/18 2330

## 2018-08-03 NOTE — ED Notes (Signed)
Pt  C/o of rectum area has poor hygiene and stool that is dried old stool  as well as loose stools are noted .

## 2018-08-04 ENCOUNTER — Encounter (HOSPITAL_COMMUNITY): Payer: Self-pay | Admitting: Emergency Medicine

## 2018-08-04 ENCOUNTER — Emergency Department (HOSPITAL_COMMUNITY)
Admission: EM | Admit: 2018-08-04 | Discharge: 2018-08-04 | Disposition: A | Discharge status: Home / Self Care | Payer: Self-pay | Attending: Emergency Medicine | Admitting: Emergency Medicine

## 2018-08-04 DIAGNOSIS — Z87891 Personal history of nicotine dependence: Secondary | ICD-10-CM | POA: Insufficient documentation

## 2018-08-04 DIAGNOSIS — R197 Diarrhea, unspecified: Secondary | ICD-10-CM | POA: Insufficient documentation

## 2018-08-04 NOTE — Discharge Instructions (Signed)
Thank you for allowing me to care for you today in the Emergency Department.   I have attached some information about food choices that can help to improve diarrhea.  Return to the emergency department if you develop blood in your diarrhea, high fever, uncontrollable abdominal pain, or other new, concerning symptoms.

## 2018-08-04 NOTE — ED Notes (Signed)
Hold lab draw at this time per RN. 

## 2018-08-04 NOTE — ED Triage Notes (Signed)
Pt arriving with complaint of diarrhea. Pt reports having episodes of diarrhea after each meal. Pt also reports burning sensation in abdomen.

## 2018-08-04 NOTE — ED Provider Notes (Signed)
[facility]-EMERGENCY DEPT Provider Note   CSN: [id number] Arrival date & time: 08/04/18  1857     History   Chief Complaint Chief Complaint  Patient presents with  . Diarrhea    HPI Collin White is a 22 y.o. adult (FTM transgender he/him/his) a history of schizophrenia and seizures who presents to the emergency department with a chief complaint of diarrhea.  Patient states nonbloody diarrhea, onset at 7 AM.  Denies fever, chills, abdominal pain, vomiting, nausea, or urinary symptoms.  The patient was seen yesterday complaints of burning in the rectal area.  Denies rectal pain today.  Patient is requesting a sandwich.  The history is provided by the patient. No language interpreter was used.    Past Medical History:  Diagnosis Date  . Migraines   . Psychoses (HCC)   . Schizophrenia (HCC)   . Seizures [facility])     Patient Active Problem List   Diagnosis Date Noted  . Acute episode of schizophrenia with history of multiple episodes (HCC) 06/30/2017  . Schizophrenia, paranoid type (HCC) 06/08/2017  . 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 Psychoses (HCC)   . Paranoid schizophrenia (HCC) 09/29/2015  . Cannabis use disorder, moderate, in early remission (HCC) 09/26/2015  . History of posttraumatic stress disorder (PTSD) 09/26/2015    Past Surgical History:  Procedure Laterality Date  . abd surgery s/p traumatic event    . facial reconstructive surgery    . NO PAST SURGERIES  patient stated he had facial reconstruction surgery as a child   . tumor removed from head           Home Medications    Prior to Admission medications   Medication Sig Start Date End Date Taking? Authorizing Provider  ondansetron (ZOFRAN ODT) 4 MG disintegrating tablet Take 1 tablet (4 mg total) by mouth every 8 (eight) hours as needed for nausea or vomiting. 07/22/18   Roxy Horseman, PA-C    Family History Family History  Problem Relation Age of Onset  .  Hypertension Other   . Mental illness Neg Hx     Social History Social History   Tobacco Use  . Smoking status: Former Smoker    Types: Cigarettes  . Smokeless tobacco: Never Used  Substance Use Topics  . Alcohol use: No  . Drug use: No     Allergies   Patient has no known allergies.   Review of Systems Review of Systems  Constitutional: Negative for activity change, chills and fever.  HENT: Negative for congestion, sinus pressure and sinus pain.   Respiratory: Negative for shortness of breath.   Cardiovascular: Negative for chest pain.  Gastrointestinal: Positive for diarrhea. Negative for abdominal pain, nausea and vomiting.  Genitourinary: Negative for dysuria.  Musculoskeletal: Negative for back pain.  Skin: Negative for rash.  Allergic/Immunologic: Negative for immunocompromised state.  Neurological: Negative for dizziness, seizures, weakness and headaches.  Psychiatric/Behavioral: Negative for confusion.   Physical Exam Updated Vital Signs BP 124/82   Pulse 86   Temp 97.9 F (36.6 C) (Oral)   Resp 18   SpO2 100%   Physical Exam Vitals signs and nursing note reviewed. Exam conducted with a chaperone present.  Constitutional:      Appearance: She is well-developed.     Comments: Disheveled appearance.   HENT:     Head: Normocephalic and atraumatic.  Neck:     Musculoskeletal: Normal range of motion. No neck rigidity.  Cardiovascular:     Rate and Rhythm:  Normal rate and regular rhythm.     Pulses: Normal pulses.     Heart sounds: Normal heart sounds. No murmur. No friction rub.  Pulmonary:     Effort: No respiratory distress.     Breath sounds: Normal breath sounds. No stridor. No wheezing, rhonchi or rales.  Chest:     Chest wall: No tenderness.  Abdominal:     General: There is no distension.     Palpations: Abdomen is soft. There is no mass.     Tenderness: There is no abdominal tenderness. There is no guarding or rebound.     Hernia: No hernia  is present.  Genitourinary:    Comments: Chaperoned exam.  There are multiple nonthrombosed external hemorrhoids.  Minimal amount of light brown stool is noted around the anus.  No bleeding.  No focal tenderness to palpation. Musculoskeletal: Normal range of motion.  Neurological:     Mental Status: She is alert and oriented to person, place, and time.      ED Treatments / Results  Labs (all labs ordered are listed, but only abnormal results are displayed) Labs Reviewed - No data to display  EKG None  Radiology No results found.  Procedures Procedures (including critical care time)  Medications Ordered in ED Medications - No data to display   Initial Impression / Assessment and Plan / ED Course  I have reviewed the triage vital signs and the nursing notes.  Pertinent labs & imaging results that were available during my care of the patient were reviewed by me and considered in my medical decision making (see chart for details).     22 year old transgender adult (he/him/his) with 24 visits in the last 6 months.  Last seen yesterday for rectal burning and was diagnosed with an anal fissure.  Presents today with nonbloody diarrhea, onset this morning.  No other associated symptoms.  Labs were last checked approximately 1-1/2-weeks ago.  On exam, the patient has no signs of clinical dehydration.  Vital signs are normal and reassuring.  Abdomen is soft, nontender, nondistended.  Rectal exam with multiple nonthrombosed external hemorrhoids, but otherwise unremarkable.    The patient suffers from homelessness, but when asked, states that he lives on [address] in a gray house with the red door.  [facility] the [facility]  Sandwich given in the ER.  Labs and further imaging or not indicated at this time.  Low suspicion for perirectal abscess, diverticulitis, C. difficile, or colitis.  Strict return precautions given.  The patient is hemodynamically stable and in  no acute distress.  Safe for discharge to home and outpatient follow-up at this time.  Final Clinical Impressions(s) / ED Diagnoses   Final diagnoses:  Diarrhea, unspecified type    ED Discharge Orders    None       Barkley BoardsMcDonald, Shelby Anderle A, PA-C 08/04/18 2241    Arby BarrettePfeiffer, Marcy, MD 08/07/18 [phone]

## 2018-08-05 ENCOUNTER — Emergency Department (HOSPITAL_COMMUNITY)
Admission: EM | Admit: 2018-08-05 | Discharge: 2018-08-05 | Disposition: A | Discharge status: Home / Self Care | Payer: Self-pay | Attending: Emergency Medicine | Admitting: Emergency Medicine

## 2018-08-05 ENCOUNTER — Other Ambulatory Visit: Payer: Self-pay

## 2018-08-05 ENCOUNTER — Encounter (HOSPITAL_COMMUNITY): Payer: Self-pay

## 2018-08-05 DIAGNOSIS — Z79899 Other long term (current) drug therapy: Secondary | ICD-10-CM | POA: Insufficient documentation

## 2018-08-05 DIAGNOSIS — Z87891 Personal history of nicotine dependence: Secondary | ICD-10-CM | POA: Insufficient documentation

## 2018-08-05 DIAGNOSIS — R197 Diarrhea, unspecified: Secondary | ICD-10-CM | POA: Insufficient documentation

## 2018-08-05 LAB — CBC WITH DIFFERENTIAL/PLATELET
Abs Immature Granulocytes: 0.02 10*3/uL (ref 0.00–0.07)
Basophils Absolute: 0 10*3/uL (ref 0.0–0.1)
Basophils Relative: 0 %
EOS ABS: 0.1 10*3/uL (ref 0.0–0.5)
Eosinophils Relative: 2 %
HCT: 40.8 % (ref 39.0–52.0)
Hemoglobin: 13.1 g/dL (ref 13.0–17.0)
Immature Granulocytes: 0 %
Lymphocytes Relative: 26 %
Lymphs Abs: 1.7 10*3/uL (ref 0.7–4.0)
MCH: 28.4 pg (ref 26.0–34.0)
MCHC: 32.1 g/dL (ref 30.0–36.0)
MCV: 88.5 fL (ref 80.0–100.0)
Monocytes Absolute: 0.5 10*3/uL (ref 0.1–1.0)
Monocytes Relative: 7 %
Neutro Abs: 4.1 10*3/uL (ref 1.7–7.7)
Neutrophils Relative %: 65 %
PLATELETS: 166 10*3/uL (ref 150–400)
RBC: 4.61 MIL/uL (ref 4.22–5.81)
RDW: 13.7 % (ref 11.5–15.5)
WBC: 6.4 10*3/uL (ref 4.0–10.5)
nRBC: 0 % (ref 0.0–0.2)

## 2018-08-05 LAB — COMPREHENSIVE METABOLIC PANEL
ALBUMIN: 4.4 g/dL (ref 3.5–5.0)
ALT: 12 U/L (ref 0–44)
ANION GAP: 8 (ref 5–15)
AST: 27 U/L (ref 15–41)
Alkaline Phosphatase: 67 U/L (ref 38–126)
BUN: 21 mg/dL — AB (ref 6–20)
CO2: 27 mmol/L (ref 22–32)
Calcium: 9.6 mg/dL (ref 8.9–10.3)
Chloride: 107 mmol/L (ref 98–111)
Creatinine, Ser: 1.11 mg/dL (ref 0.61–1.24)
GFR calc Af Amer: >60 mL/min (ref >60)
GFR calc non Af Amer: >60 mL/min (ref >60)
GLUCOSE: 86 mg/dL (ref 70–99)
Potassium: 4.6 mmol/L (ref 3.5–5.1)
Sodium: 142 mmol/L (ref 135–145)
Total Bilirubin: 0.7 mg/dL (ref 0.3–1.2)
Total Protein: 7.9 g/dL (ref 6.5–8.1)

## 2018-08-05 LAB — URINALYSIS, ROUTINE W REFLEX MICROSCOPIC
Bilirubin Urine: NEGATIVE
GLUCOSE, UA: NEGATIVE mg/dL
Hgb urine dipstick: NEGATIVE
Ketones, ur: NEGATIVE mg/dL
LEUKOCYTES UA: NEGATIVE
NITRITE: NEGATIVE
Protein, ur: NEGATIVE mg/dL
Specific Gravity, Urine: 1.023 (ref 1.005–1.030)
pH: 6 (ref 5.0–8.0)

## 2018-08-05 LAB — LIPASE, BLOOD: Lipase: 29 U/L (ref 11–51)

## 2018-08-05 MED ORDER — ONDANSETRON HCL 4 MG/2ML IJ SOLN
4.0000 mg | Freq: Once | INTRAMUSCULAR | Status: DC
  Filled 2018-08-05: qty 2

## 2018-08-05 MED ORDER — ONDANSETRON 4 MG PO TBDP
4.0000 mg | ORAL_TABLET | Freq: Once | ORAL | Status: AC
  Administered 2018-08-05: 4 mg via ORAL
  Filled 2018-08-05: qty 1

## 2018-08-05 MED ORDER — DIPHENOXYLATE-ATROPINE 2.5-0.025 MG PO TABS
1.0000 | ORAL_TABLET | Freq: Once | ORAL | Status: AC
  Administered 2018-08-05: 1 via ORAL
  Filled 2018-08-05: qty 1

## 2018-08-05 NOTE — ED Notes (Signed)
This RN called lab about pt's CBC.  Lab stated the CBC had clotted and they need a recollect.

## 2018-08-05 NOTE — Discharge Instructions (Signed)
Return if any problems.

## 2018-08-05 NOTE — ED Provider Notes (Signed)
[facility]-EMERGENCY DEPT Provider Note   CSN: [id number] Arrival date & time: 08/05/18  [id number]     History   Chief Complaint No chief complaint on file.   HPI Collin White is a 22 y.o. adult.  The history is provided by the patient. No language interpreter was used.  Diarrhea   This is a new problem. The current episode started yesterday. The problem occurs 5 to 10 times per day. There has been no fever. She has tried nothing for the symptoms. The treatment provided no relief.   Pt reports multiple episodes of diarrhea.  Past Medical History:  Diagnosis Date  . Migraines   . Psychoses (HCC)   . Schizophrenia (HCC)   . Seizures [name](HCC)     Patient Active Problem List   Diagnosis Date Noted  . Acute episode of schizophrenia with history of multiple episodes (HCC) 06/30/2017  . Schizophrenia, paranoid type (HCC) 06/08/2017  . 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 Psychoses (HCC)   . Paranoid schizophrenia (HCC) 09/29/2015  . Cannabis use disorder, moderate, in early remission (HCC) 09/26/2015  . History of posttraumatic stress disorder (PTSD) 09/26/2015    Past Surgical History:  Procedure Laterality Date  . abd surgery s/p traumatic event    . facial reconstructive surgery    . NO PAST SURGERIES  patient stated he had facial reconstruction surgery as a child   . tumor removed from head           Home Medications    Prior to Admission medications   Medication Sig Start Date End Date Taking? Authorizing Provider  ondansetron (ZOFRAN ODT) 4 MG disintegrating tablet Take 1 tablet (4 mg total) by mouth every 8 (eight) hours as needed for nausea or vomiting. 07/22/18   Roxy HorsemanBrowning, Robert, PA-C    Family History Family History  Problem Relation Age of Onset  . Hypertension Other   . Mental illness Neg Hx     Social History Social History   Tobacco Use  . Smoking status: Former Smoker    Types: Cigarettes  . Smokeless  tobacco: Never Used  Substance Use Topics  . Alcohol use: No  . Drug use: No     Allergies   Patient has no known allergies.   Review of Systems Review of Systems  Gastrointestinal: Positive for diarrhea.  All other systems reviewed and are negative.    Physical Exam Updated Vital Signs There were no vitals taken for this visit.  Physical Exam Vitals signs and nursing note reviewed.  Constitutional:      Appearance: She is well-developed.  HENT:     Head: Normocephalic.     Right Ear: Tympanic membrane normal.     Nose: Nose normal.     Mouth/Throat:     Mouth: Mucous membranes are moist.  Eyes:     Pupils: Pupils are equal, round, and reactive to light.  Neck:     Musculoskeletal: Normal range of motion.  Cardiovascular:     Rate and Rhythm: Normal rate.  Pulmonary:     Effort: Pulmonary effort is normal.  Abdominal:     General: Abdomen is flat. There is no distension.     Palpations: Abdomen is soft.  Musculoskeletal: Normal range of motion.  Neurological:     Mental Status: She is alert and oriented to person, place, and time.  Psychiatric:        Mood and Affect: Mood normal.      ED Treatments /  Results  Labs (all labs ordered are listed, but only abnormal results are displayed) Labs Reviewed - No data to display  EKG None  Radiology No results found.  Procedures Procedures (including critical care time)  Medications Ordered in ED Medications - No data to display   Initial Impression / Assessment and Plan / ED Course  I have reviewed the triage vital signs and the nursing notes.  Pertinent labs & imaging results that were available during my care of the patient were reviewed by me and considered in my medical decision making (see chart for details).    MDM Pt given zofran odt and imodium.  No further diarrhea after medications. Labs reviewed.  No acute abnormality    Final Clinical Impressions(s) / ED Diagnoses   Final diagnoses:    Diarrhea, unspecified type    ED Discharge Orders    None    An After Visit Summary was printed and given to the patient.    Osie CheeksSofia,  K, PA-C 08/05/18 1410    Azalia Bilisampos, Kevin, MD 08/05/18 [phone]

## 2018-08-05 NOTE — ED Notes (Signed)
Pt difficult stick RN 3x attempt.  Phlebotomy called

## 2018-08-05 NOTE — ED Triage Notes (Signed)
Pt reports c/o of burning diarrhea since yesterday, and c/o of abdominal pain

## 2018-08-12 ENCOUNTER — Encounter (HOSPITAL_COMMUNITY): Payer: Self-pay

## 2018-08-12 ENCOUNTER — Emergency Department (HOSPITAL_COMMUNITY)
Admission: EM | Admit: 2018-08-12 | Discharge: 2018-08-13 | Disposition: A | Discharge status: Home / Self Care | Payer: Self-pay | Attending: Emergency Medicine | Admitting: Emergency Medicine

## 2018-08-12 DIAGNOSIS — Z7982 Long term (current) use of aspirin: Secondary | ICD-10-CM | POA: Insufficient documentation

## 2018-08-12 DIAGNOSIS — R109 Unspecified abdominal pain: Secondary | ICD-10-CM

## 2018-08-12 DIAGNOSIS — R1084 Generalized abdominal pain: Secondary | ICD-10-CM | POA: Insufficient documentation

## 2018-08-12 NOTE — ED Triage Notes (Signed)
Pt complains of left flank pain

## 2018-08-13 ENCOUNTER — Other Ambulatory Visit: Payer: Self-pay

## 2018-08-13 ENCOUNTER — Emergency Department (HOSPITAL_COMMUNITY): Payer: Self-pay

## 2018-08-13 LAB — RAPID URINE DRUG SCREEN, HOSP PERFORMED
Amphetamines: NOT DETECTED
Barbiturates: NOT DETECTED
Benzodiazepines: NOT DETECTED
Cocaine: NOT DETECTED
Opiates: NOT DETECTED
Tetrahydrocannabinol: NOT DETECTED

## 2018-08-13 LAB — COMPREHENSIVE METABOLIC PANEL
ALK PHOS: 63 U/L (ref 38–126)
ALT: 13 U/L (ref 0–44)
AST: 20 U/L (ref 15–41)
Albumin: 4 g/dL (ref 3.5–5.0)
Anion gap: 10 (ref 5–15)
BUN: 34 mg/dL — ABNORMAL HIGH (ref 6–20)
CO2: 24 mmol/L (ref 22–32)
Calcium: 8.6 mg/dL — ABNORMAL LOW (ref 8.9–10.3)
Chloride: 108 mmol/L (ref 98–111)
Creatinine, Ser: 1.11 mg/dL (ref 0.61–1.24)
GFR calc Af Amer: >60 mL/min (ref >60)
GFR calc non Af Amer: >60 mL/min (ref >60)
Glucose, Bld: 90 mg/dL (ref 70–99)
Potassium: 3.6 mmol/L (ref 3.5–5.1)
Sodium: 142 mmol/L (ref 135–145)
Total Bilirubin: 0.9 mg/dL (ref 0.3–1.2)
Total Protein: 7.5 g/dL (ref 6.5–8.1)

## 2018-08-13 LAB — ETHANOL: Alcohol, Ethyl (B): <10 mg/dL (ref <10)

## 2018-08-13 LAB — CBC WITH DIFFERENTIAL/PLATELET
ABS IMMATURE GRANULOCYTES: 0.01 10*3/uL (ref 0.00–0.07)
Basophils Absolute: 0 10*3/uL (ref 0.0–0.1)
Basophils Relative: 0 %
Eosinophils Absolute: 0 10*3/uL (ref 0.0–0.5)
Eosinophils Relative: 1 %
HCT: 35.6 % — ABNORMAL LOW (ref 39.0–52.0)
HEMOGLOBIN: 11.5 g/dL — AB (ref 13.0–17.0)
IMMATURE GRANULOCYTES: 0 %
Lymphocytes Relative: 27 %
Lymphs Abs: 1.3 10*3/uL (ref 0.7–4.0)
MCH: 28.5 pg (ref 26.0–34.0)
MCHC: 32.3 g/dL (ref 30.0–36.0)
MCV: 88.3 fL (ref 80.0–100.0)
Monocytes Absolute: 0.4 10*3/uL (ref 0.1–1.0)
Monocytes Relative: 8 %
NEUTROS PCT: 64 %
Neutro Abs: 3.2 10*3/uL (ref 1.7–7.7)
Platelets: 184 10*3/uL (ref 150–400)
RBC: 4.03 MIL/uL — ABNORMAL LOW (ref 4.22–5.81)
RDW: 13.2 % (ref 11.5–15.5)
WBC: 5 10*3/uL (ref 4.0–10.5)
nRBC: 0 % (ref 0.0–0.2)

## 2018-08-13 LAB — LIPASE, BLOOD: Lipase: 42 U/L (ref 11–51)

## 2018-08-13 LAB — URINALYSIS, ROUTINE W REFLEX MICROSCOPIC
Bilirubin Urine: NEGATIVE
Glucose, UA: NEGATIVE mg/dL
Hgb urine dipstick: NEGATIVE
Ketones, ur: 20 mg/dL — AB
Leukocytes, UA: NEGATIVE
Nitrite: NEGATIVE
PH: 6 (ref 5.0–8.0)
Protein, ur: NEGATIVE mg/dL
Specific Gravity, Urine: 1.039 — ABNORMAL HIGH (ref 1.005–1.030)

## 2018-08-13 MED ORDER — SODIUM CHLORIDE 0.9 % IV BOLUS
1000.0000 mL | Freq: Once | INTRAVENOUS | Status: AC
  Administered 2018-08-13: 1000 mL via INTRAVENOUS

## 2018-08-13 MED ORDER — KETOROLAC TROMETHAMINE 15 MG/ML IJ SOLN
15.0000 mg | Freq: Once | INTRAMUSCULAR | Status: AC
  Administered 2018-08-13: 15 mg via INTRAVENOUS
  Filled 2018-08-13: qty 1

## 2018-08-13 MED ORDER — IOPAMIDOL (ISOVUE-300) INJECTION 61%
100.0000 mL | Freq: Once | INTRAVENOUS | Status: AC | PRN
  Administered 2018-08-13: 100 mL via INTRAVENOUS

## 2018-08-13 MED ORDER — ASPIRIN EC 325 MG PO TBEC: 325.0000 mg | DELAYED_RELEASE_TABLET | Freq: Every day | ORAL | 0 refills | Status: DC

## 2018-08-13 MED ORDER — IOPAMIDOL (ISOVUE-300) INJECTION 61%
INTRAVENOUS | Status: AC
  Filled 2018-08-13: qty 100

## 2018-08-13 MED ORDER — SODIUM CHLORIDE (PF) 0.9 % IJ SOLN
INTRAMUSCULAR | Status: AC
  Filled 2018-08-13: qty 50

## 2018-08-13 MED ORDER — ASPIRIN 81 MG PO CHEW
324.0000 mg | CHEWABLE_TABLET | Freq: Once | ORAL | Status: AC
  Administered 2018-08-13: 324 mg via ORAL
  Filled 2018-08-13: qty 4

## 2018-08-13 MED ORDER — PROCHLORPERAZINE EDISYLATE 10 MG/2ML IJ SOLN
10.0000 mg | Freq: Once | INTRAMUSCULAR | Status: AC
  Administered 2018-08-13: 10 mg via INTRAVENOUS
  Filled 2018-08-13: qty 2

## 2018-08-13 NOTE — ED Notes (Signed)
Attempted to get blood work from IV, unsuccessful.

## 2018-08-13 NOTE — Discharge Instructions (Signed)
Take Aspirin every day.   It is VERY important that you see [facility]. You have a clot in one of your veins in your abdomen and you will need to see her for further work up of this.   Return to ER for new / worsening symptoms, any additional concerns.

## 2018-08-13 NOTE — Care Management Note (Signed)
Case Management Note  CM consulted for no pcp and no ins.  CM spoke with Kennon HolterPilcher, PA and advised that contact information for [facility]avin[facility]Mary Ann Placey[facility] was already on pt's [facility].  No further CM needs identified during conversation.  Sundee Garland, Lynnae SandhoffAngela N, RN 08/13/2018, 11:55 AM

## 2018-08-13 NOTE — ED Provider Notes (Signed)
[facility]-EMERGENCY DEPT Provider Note   CSN: [id number] Arrival date & time: 08/12/18  2159     History   Chief Complaint Chief Complaint  Patient presents with  . Flank Pain    HPI Collin White is a 22 y.o. adult.  The history is provided by the patient and medical records. No language interpreter was used.  Flank Pain  Associated symptoms include abdominal pain.   Collin White is a 22 y.o. adult  with a PMH of migraines, schizophrenia, seizures who presents to the Emergency Department complaining of worsening generalized abdominal pain for several days. Feels most intense to the left flank. Associated with nausea and several episodes of emesis. Also notes headache c/w his usual migraines. Denies any medications prior to arrival for his symptoms. Denies any drug or ETOH use. Reports diarrhea a few weeks ago which has resolved. No recent ABX use.    Past Medical History:  Diagnosis Date  . Migraines   . Psychoses (HCC)   . Schizophrenia (HCC)   . Seizures [facility](HCC)     Patient Active Problem List   Diagnosis Date Noted  . Acute episode of schizophrenia with history of multiple episodes (HCC) 06/30/2017  . Schizophrenia, paranoid type (HCC) 06/08/2017  . 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 Psychoses (HCC)   . Paranoid schizophrenia (HCC) 09/29/2015  . Cannabis use disorder, moderate, in early remission (HCC) 09/26/2015  . History of posttraumatic stress disorder (PTSD) 09/26/2015    Past Surgical History:  Procedure Laterality Date  . abd surgery s/p traumatic event    . facial reconstructive surgery    . NO PAST SURGERIES  patient stated he had facial reconstruction surgery as a child   . tumor removed from head           Home Medications    Prior to Admission medications   Medication Sig Start Date End Date Taking? Authorizing Provider  aspirin EC 325 MG tablet Take 1 tablet (325 mg total) by mouth daily. 08/13/18    Loreal Schuessler, Chase PicketJaime Pilcher, PA-C  ondansetron (ZOFRAN ODT) 4 MG disintegrating tablet Take 1 tablet (4 mg total) by mouth every 8 (eight) hours as needed for nausea or vomiting. 07/22/18   Roxy HorsemanBrowning, Robert, PA-C    Family History Family History  Problem Relation Age of Onset  . Hypertension Other   . Mental illness Neg Hx     Social History Social History   Tobacco Use  . Smoking status: Former Smoker    Types: Cigarettes  . Smokeless tobacco: Never Used  Substance Use Topics  . Alcohol use: No  . Drug use: No     Allergies   Patient has no known allergies.   Review of Systems Review of Systems  Constitutional: Negative for fever.  Gastrointestinal: Positive for abdominal pain, diarrhea (Resolved), nausea and vomiting. Negative for blood in stool.  Genitourinary: Positive for flank pain. Negative for difficulty urinating, dysuria and urgency.  All other systems reviewed and are negative.    Physical Exam Updated Vital Signs BP 117/89   Pulse 75   Temp 98.4 F (36.9 C) (Oral)   Resp 15   SpO2 100%   Physical Exam Vitals signs and nursing note reviewed.  Constitutional:      General: She is not in acute distress.    Appearance: She is well-developed.     Comments: Unkempt. Mumbles frequently throughout evaluation when responding to questions. Does not appear to be responding to internal stimuli at  present.    HENT:     Head: Normocephalic and atraumatic.  Cardiovascular:     Rate and Rhythm: Normal rate and regular rhythm.     Heart sounds: Normal heart sounds. No murmur.  Pulmonary:     Effort: Pulmonary effort is normal. No respiratory distress.     Breath sounds: Normal breath sounds.  Abdominal:     General: There is no distension.     Palpations: Abdomen is soft.     Comments: Diffuse abdominal tenderness.  Skin:    General: Skin is warm and dry.  Neurological:     Mental Status: She is alert and oriented to person, place, and time.      ED  Treatments / Results  Labs (all labs ordered are listed, but only abnormal results are displayed) Labs Reviewed  URINALYSIS, ROUTINE W REFLEX MICROSCOPIC - Abnormal; Notable for the following components:      Result Value   Specific Gravity, Urine 1.039 (*)    Ketones, ur 20 (*)    All other components within normal limits  CBC WITH DIFFERENTIAL/PLATELET - Abnormal; Notable for the following components:   RBC 4.03 (*)    Hemoglobin 11.5 (*)    HCT 35.6 (*)    All other components within normal limits  COMPREHENSIVE METABOLIC PANEL - Abnormal; Notable for the following components:   BUN 34 (*)    Calcium 8.6 (*)    All other components within normal limits  LIPASE, BLOOD  RAPID URINE DRUG SCREEN, HOSP PERFORMED  ETHANOL    EKG None  Radiology Ct Abdomen Pelvis W Contrast  Result Date: 08/13/2018 CLINICAL DATA:  Abdominal pain EXAM: CT ABDOMEN AND PELVIS WITH CONTRAST TECHNIQUE: Multidetector CT imaging of the abdomen and pelvis was performed using the standard protocol following bolus administration of intravenous contrast. CONTRAST:  ISOVUE-300 IOPAMIDOL (ISOVUE-300) INJECTION 61% COMPARISON:  06/03/2017 FINDINGS: Lower chest:  No contributory findings. Hepatobiliary: No focal liver abnormality.No evidence of biliary obstruction or stone. Pancreas: Unremarkable. Spleen: Unremarkable. Adrenals/Urinary Tract: Negative adrenals. No hydronephrosis or stone. Unremarkable bladder. Stomach/Bowel: No obstruction. No appendicitis or other inflammatory changes. Vascular/Lymphatic: There is a nonocclusive filling defect within a portal branch either serving the left colon, see marked coronal reformats. This is not a mixing artifact given the tubular shape and the contrast timing with diffusely opacified portal venous system. The finding is unusual in isolation as the colon itself appears unremarkable and there is no surrounding inflammation typical of septic thrombophlebitis. No underlying  obstructive lesion is seen. No propagation into the central portal venous system. No mass or adenopathy. Reproductive:Negative Other: No ascites or pneumoperitoneum. Musculoskeletal: No acute abnormalities. Mild dextroscoliosis of the lumbar spine IMPRESSION: 1. 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 No evidence of mesenteric ischemia. 2. Otherwise negative abdomen. Electronically Signed   By: Marnee Spring M.D.   On: 08/13/2018 10:46    Procedures Procedures (including critical care time)  Medications Ordered in ED Medications  iopamidol (ISOVUE-300) 61 % injection (has no administration in time range)  sodium chloride (PF) 0.9 % injection (has no administration in time range)  aspirin chewable tablet 324 mg (has no administration in time range)  sodium chloride 0.9 % bolus 1,000 mL (0 mLs Intravenous Stopped 08/13/18 0802)  prochlorperazine (COMPAZINE) injection 10 mg (10 mg Intravenous Given 08/13/18 0703)  ketorolac (TORADOL) 15 MG/ML injection 15 mg (15 mg Intravenous Given 08/13/18 0703)  iopamidol (ISOVUE-300) 61 % injection 100 mL (100 mLs Intravenous Contrast Given 08/13/18 1018)     Initial Impression / Assessment and Plan / ED Course  I have reviewed the triage vital signs and the nursing notes.  Pertinent labs & imaging results that were available during my care of the patient were reviewed by me and considered in my medical decision making (see chart for details).    Collin White is a 22 y.o. adult who presents to ED for left-sided abdominal pain. Afebrile and hemodynamically stable on exam. General abdominal tenderness, most significantly to the left. UA without signs of infection.  Blood work reassuring.  CT scan shows nonocclusive thrombus within the left colic vein without any underlying inflammation detected.  No other findings on scan.  Discussed case and CT findings with  attending, Dr. Jacqulyn BathLong, who recommends starting patient on full dose aspirin and stressing PCP follow-up.  Case management consulted who has put information in discharge summary for follow-up care.  I stressed the importance of this with patient at length.  Hopefully he will follow-up.Evaluation does not show pathology that would require ongoing emergent intervention or inpatient treatment. Reasons to return to ER were discussed and all questions answered.   Final Clinical Impressions(s) / ED Diagnoses   Final diagnoses:  Flank pain    ED Discharge Orders         Ordered    aspirin EC 325 MG tablet  Daily     08/13/18 1209           Carlisle Enke, Chase PicketJaime Pilcher, PA-C 08/13/18 1213    Maia PlanLong, Joshua G, MD 08/13/18 1836

## 2018-08-19 ENCOUNTER — Emergency Department (HOSPITAL_COMMUNITY): Payer: Self-pay

## 2018-08-19 ENCOUNTER — Emergency Department (HOSPITAL_COMMUNITY)
Admission: EM | Admit: 2018-08-19 | Discharge: 2018-08-19 | Disposition: A | Discharge status: Home / Self Care | Payer: Self-pay | Attending: Emergency Medicine | Admitting: Emergency Medicine

## 2018-08-19 DIAGNOSIS — F209 Schizophrenia, unspecified: Secondary | ICD-10-CM | POA: Insufficient documentation

## 2018-08-19 DIAGNOSIS — F32A Depression, unspecified: Secondary | ICD-10-CM

## 2018-08-19 DIAGNOSIS — R109 Unspecified abdominal pain: Secondary | ICD-10-CM | POA: Insufficient documentation

## 2018-08-19 DIAGNOSIS — Z87891 Personal history of nicotine dependence: Secondary | ICD-10-CM | POA: Insufficient documentation

## 2018-08-19 DIAGNOSIS — F329 Major depressive disorder, single episode, unspecified: Secondary | ICD-10-CM

## 2018-08-19 DIAGNOSIS — R51 Headache: Secondary | ICD-10-CM | POA: Insufficient documentation

## 2018-08-19 LAB — COMPREHENSIVE METABOLIC PANEL
ALT: 11 U/L (ref 0–44)
AST: 21 U/L (ref 15–41)
Albumin: 4 g/dL (ref 3.5–5.0)
Alkaline Phosphatase: 59 U/L (ref 38–126)
Anion gap: 9 (ref 5–15)
BUN: 19 mg/dL (ref 6–20)
CO2: 27 mmol/L (ref 22–32)
Calcium: 9.3 mg/dL (ref 8.9–10.3)
Chloride: 106 mmol/L (ref 98–111)
Creatinine, Ser: 1.07 mg/dL (ref 0.61–1.24)
GFR calc Af Amer: >60 mL/min (ref >60)
GFR calc non Af Amer: >60 mL/min (ref >60)
Glucose, Bld: 92 mg/dL (ref 70–99)
Potassium: 4 mmol/L (ref 3.5–5.1)
Sodium: 142 mmol/L (ref 135–145)
Total Bilirubin: 0.6 mg/dL (ref 0.3–1.2)
Total Protein: 7.5 g/dL (ref 6.5–8.1)

## 2018-08-19 LAB — URINALYSIS, ROUTINE W REFLEX MICROSCOPIC
Bilirubin Urine: NEGATIVE
Glucose, UA: NEGATIVE mg/dL
Hgb urine dipstick: NEGATIVE
Ketones, ur: 5 mg/dL — AB
Leukocytes, UA: NEGATIVE
Nitrite: NEGATIVE
Protein, ur: NEGATIVE mg/dL
Specific Gravity, Urine: 1.03 (ref 1.005–1.030)
pH: 8 (ref 5.0–8.0)

## 2018-08-19 LAB — CBC WITH DIFFERENTIAL/PLATELET
ABS IMMATURE GRANULOCYTES: 0.03 10*3/uL (ref 0.00–0.07)
Basophils Absolute: 0 10*3/uL (ref 0.0–0.1)
Basophils Relative: 1 %
Eosinophils Absolute: 0.1 10*3/uL (ref 0.0–0.5)
Eosinophils Relative: 1 %
HCT: 41.7 % (ref 39.0–52.0)
HEMOGLOBIN: 13.3 g/dL (ref 13.0–17.0)
Immature Granulocytes: 0 %
Lymphocytes Relative: 21 %
Lymphs Abs: 1.8 10*3/uL (ref 0.7–4.0)
MCH: 28.4 pg (ref 26.0–34.0)
MCHC: 31.9 g/dL (ref 30.0–36.0)
MCV: 88.9 fL (ref 80.0–100.0)
Monocytes Absolute: 0.5 10*3/uL (ref 0.1–1.0)
Monocytes Relative: 6 %
Neutro Abs: 6.2 10*3/uL (ref 1.7–7.7)
Neutrophils Relative %: 71 %
Platelets: 237 10*3/uL (ref 150–400)
RBC: 4.69 MIL/uL (ref 4.22–5.81)
RDW: 13.6 % (ref 11.5–15.5)
WBC: 8.7 10*3/uL (ref 4.0–10.5)
nRBC: 0 % (ref 0.0–0.2)

## 2018-08-19 LAB — ACETAMINOPHEN LEVEL: Acetaminophen (Tylenol), Serum: <10 ug/mL — ABNORMAL LOW (ref 10–30)

## 2018-08-19 LAB — RAPID URINE DRUG SCREEN, HOSP PERFORMED
Amphetamines: NOT DETECTED
Barbiturates: NOT DETECTED
Benzodiazepines: NOT DETECTED
Cocaine: NOT DETECTED
OPIATES: NOT DETECTED
Tetrahydrocannabinol: NOT DETECTED

## 2018-08-19 LAB — I-STAT CG4 LACTIC ACID, ED: LACTIC ACID, VENOUS: 1.68 mmol/L (ref 0.5–1.9)

## 2018-08-19 LAB — I-STAT BETA HCG BLOOD, ED (MC, WL, AP ONLY): I-stat hCG, quantitative: <5 m[IU]/mL (ref <5)

## 2018-08-19 LAB — ETHANOL: Alcohol, Ethyl (B): <10 mg/dL (ref <10)

## 2018-08-19 LAB — SALICYLATE LEVEL: Salicylate Lvl: <7 mg/dL (ref 2.8–30.0)

## 2018-08-19 LAB — LIPASE, BLOOD: Lipase: 43 U/L (ref 11–51)

## 2018-08-19 MED ORDER — IOPAMIDOL (ISOVUE-370) INJECTION 76%
INTRAVENOUS | Status: AC
  Filled 2018-08-19: qty 100

## 2018-08-19 MED ORDER — KETOROLAC TROMETHAMINE 30 MG/ML IJ SOLN
30.0000 mg | Freq: Once | INTRAMUSCULAR | Status: AC
  Administered 2018-08-19: 30 mg via INTRAVENOUS
  Filled 2018-08-19: qty 1

## 2018-08-19 MED ORDER — IOPAMIDOL (ISOVUE-370) INJECTION 76%
100.0000 mL | Freq: Once | INTRAVENOUS | Status: AC | PRN
  Administered 2018-08-19: 100 mL via INTRAVENOUS

## 2018-08-19 MED ORDER — SODIUM CHLORIDE (PF) 0.9 % IJ SOLN
INTRAMUSCULAR | Status: AC
  Filled 2018-08-19: qty 50

## 2018-08-19 MED ORDER — SODIUM CHLORIDE 0.9 % IV BOLUS
1000.0000 mL | Freq: Once | INTRAVENOUS | Status: AC
  Administered 2018-08-19: 1000 mL via INTRAVENOUS

## 2018-08-19 NOTE — Discharge Instructions (Addendum)
Continue ASA 325 mg daily.   See your doctor   Return to ER if you have worse flank pain, vomiting, fever, worse depression, thoughts of harming yourself, hallucinations

## 2018-08-19 NOTE — ED Notes (Signed)
Pt given personal belongings back and changing in bathroom. Ambulatory

## 2018-08-19 NOTE — ED Provider Notes (Signed)
[facility]-EMERGENCY DEPT Provider Note   CSN: [id number] Arrival date & time: 08/19/18  1738     History   Chief Complaint Chief Complaint  Patient presents with  . Psychiatric Evaluation  . Flank Pain    HPI Collin White is a 23 y.o. adult hx of schizophrenia, seizures, psychosis, here presenting with R flank pain, headaches.  Patient states that she has been having right flank pain for the last several days.  Patient was seen in the ED several days ago and had a CT that showed a colonic pain thrombosis and CT abdomen pelvis.  Patient was started on full dose aspirin but complains of persistent right flank pain.  Patient also has headache as well.  Denies any fevers or chills or vomiting.  Patient initially said that he had some purulent discharge from the right flank area.  She denies any constipation or diarrhea.  She denies any suicidal homicidal ideation or hallucinations.  The history is provided by the patient.    Past Medical History:  Diagnosis Date  . Migraines   . Psychoses (HCC)   . Schizophrenia (HCC)   . Seizures [facility])     Patient Active Problem List   Diagnosis Date Noted  . Acute episode of schizophrenia with history of multiple episodes (HCC) 06/30/2017  . Schizophrenia, paranoid type (HCC) 06/08/2017  . 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 Psychoses (HCC)   . Paranoid schizophrenia (HCC) 09/29/2015  . Cannabis use disorder, moderate, in early remission (HCC) 09/26/2015  . History of posttraumatic stress disorder (PTSD) 09/26/2015    Past Surgical History:  Procedure Laterality Date  . abd surgery s/p traumatic event    . facial reconstructive surgery    . NO PAST SURGERIES  patient stated he had facial reconstruction surgery as a child   . tumor removed from head           Home Medications    Prior to Admission medications   Medication Sig Start Date End Date Taking? Authorizing Provider  aspirin EC 325 MG  tablet Take 1 tablet (325 mg total) by mouth daily. 08/13/18   Ward, Chase Picket, PA-C  ondansetron (ZOFRAN ODT) 4 MG disintegrating tablet Take 1 tablet (4 mg total) by mouth every 8 (eight) hours as needed for nausea or vomiting. 07/22/18   Roxy Horseman, PA-C    Family History Family History  Problem Relation Age of Onset  . Hypertension Other   . Mental illness Neg Hx     Social History Social History   Tobacco Use  . Smoking status: Former Smoker    Types: Cigarettes  . Smokeless tobacco: Never Used  Substance Use Topics  . Alcohol use: No  . Drug use: No     Allergies   Patient has no known allergies.   Review of Systems Review of Systems  Genitourinary: Positive for flank pain.  All other systems reviewed and are negative.    Physical Exam Updated Vital Signs BP 118/72   Pulse 84   Temp 98.6 F (37 C) (Oral)   Resp 19   Ht 6' (1.829 m)   Wt 66.7 kg   SpO2 98%   BMI 19.94 kg/m   Physical Exam Vitals signs and nursing note reviewed.  HENT:     Head: Normocephalic.     Nose: Nose normal.     Mouth/Throat:     Mouth: Mucous membranes are moist.  Eyes:     Extraocular Movements: Extraocular movements  intact.     Pupils: Pupils are equal, round, and reactive to light.  Neck:     Musculoskeletal: Normal range of motion.  Cardiovascular:     Rate and Rhythm: Normal rate and regular rhythm.     Pulses: Normal pulses.     Heart sounds: Normal heart sounds.  Pulmonary:     Effort: Pulmonary effort is normal.     Breath sounds: Normal breath sounds.  Abdominal:     General: Abdomen is flat.     Palpations: Abdomen is soft.     Comments: Mild R CVAT   Musculoskeletal: Normal range of motion.  Skin:    General: Skin is warm.     Capillary Refill: Capillary refill takes less than 2 seconds.  Neurological:     General: No focal deficit present.     Mental Status: She is alert.  Psychiatric:        Mood and Affect: Mood normal.      ED  Treatments / Results  Labs (all labs ordered are listed, but only abnormal results are displayed) Labs Reviewed  ACETAMINOPHEN LEVEL - Abnormal; Notable for the following components:      Result Value   Acetaminophen (Tylenol), Serum <10 (*)    All other components within normal limits  URINALYSIS, ROUTINE W REFLEX MICROSCOPIC - Abnormal; Notable for the following components:   Ketones, ur 5 (*)    All other components within normal limits  CBC WITH DIFFERENTIAL/PLATELET  COMPREHENSIVE METABOLIC PANEL  LIPASE, BLOOD  ETHANOL  SALICYLATE LEVEL  RAPID URINE DRUG SCREEN, HOSP PERFORMED  I-STAT BETA HCG BLOOD, ED ([facility], [facility], [facility] ONLY)  I-STAT CG4 LACTIC ACID, ED    EKG None  Radiology Dg Chest 2 View  Result Date: 08/19/2018 CLINICAL DATA:  Weakness chest pain EXAM: CHEST - 2 VIEW COMPARISON:  07/22/2018 FINDINGS: The heart size and mediastinal contours are within normal limits. Both lungs are clear. The visualized skeletal structures are unremarkable. IMPRESSION: No active cardiopulmonary disease. Electronically Signed   By: Jasmine Pang M.D.   On: 08/19/2018 20:43   Ct Head Wo Contrast  Result Date: 08/19/2018 CLINICAL DATA:  Headache, acute, normal neuro exam EXAM: CT HEAD WITHOUT CONTRAST TECHNIQUE: Contiguous axial images were obtained from the base of the skull through the vertex without intravenous contrast. COMPARISON:  Head CT 05/20/2018 FINDINGS: Brain: No intracranial hemorrhage, mass effect, or midline shift. No hydrocephalus. The basilar cisterns are patent. No evidence of territorial infarct or acute ischemia. No extra-axial or intracranial fluid collection. Vascular: No hyperdense vessel. Skull: No fracture or focal lesion. Sinuses/Orbits: Paranasal sinuses and mastoid air cells are clear. The visualized orbits are unremarkable. Other: None. IMPRESSION: Unremarkable noncontrast head CT. Electronically Signed   By: Narda Rutherford M.D.   On: 08/19/2018 22:02   Ct Angio Abd/pel  W And/or Wo Contrast  Result Date: 08/19/2018 CLINICAL DATA:  Portal vein thrombosis r/o mesenteric ischemia. Patient reports abdominal pain. History of thrombus in the left colic vein. EXAM: CTA ABDOMEN AND PELVIS wITHOUT AND WITH CONTRAST TECHNIQUE: Multidetector CT imaging of the abdomen and pelvis was performed using the standard protocol during bolus administration of intravenous contrast. Multiplanar reconstructed images and MIPs were obtained and reviewed to evaluate the vascular anatomy. CONTRAST:  ISOVUE-370 IOPAMIDOL (ISOVUE-370) INJECTION 76% COMPARISON:  CT 6 days ago 08/13/2018 FINDINGS: VASCULAR Aorta: Normal caliber aorta without aneurysm, dissection, vasculitis or significant stenosis. Celiac: Patent without evidence of aneurysm, dissection, vasculitis or significant stenosis. SMA: Patent  without evidence of aneurysm, dissection, vasculitis or significant stenosis. Mesenteric branches are patent. Renals: Both renal arteries are patent without evidence of aneurysm, dissection, vasculitis, fibromuscular dysplasia or significant stenosis. IMA: Patent without evidence of aneurysm, dissection, vasculitis or significant stenosis. Inflow: Patent without evidence of aneurysm, dissection, vasculitis or significant stenosis. Proximal Outflow: Bilateral common femoral and visualized portions of the superficial and profunda femoral arteries are patent without evidence of aneurysm, dissection, vasculitis or significant stenosis. Veins: The venous structures are not as well assessed as on prior exam given phase of contrast. Allowing for this, no evidence of portal vein thrombosis. The previous nonocclusive filling defect within a portal branches serving the left colon is not as well assessed given phase of contrast on the current exam, tentatively identified on image 56 series 7. No evidence of progression. The distal branches are not well assessed. Review of the MIP images confirms the above findings.  NON-VASCULAR Lower chest: Lung bases are clear. Hepatobiliary: No focal hepatic abnormality. Gallbladder is decompressed, no calcified gallstone. Pancreas: Not well assessed on the current exam, no evidence of pancreatic inflammation. Spleen: Normal in size without focal abnormality. Adrenals/Urinary Tract: Normal adrenal glands. No hydronephrosis or perinephric edema. Urinary bladder is partially distended. No definite perivesicular edema. Stomach/Bowel: Bowel assessment is limited in the absence of enteric contrast and paucity of intra-abdominal fat. No evidence of bowel inflammation, wall thickening or obstruction. Stomach is distended with ingested contents. Lymphatic: No bulky adenopathy. Reproductive: Prostate is unremarkable. Other: No free air or ascites. Musculoskeletal: There are no acute or suspicious osseous abnormalities. IMPRESSION: VASCULAR 1. The filling defect in the left colic vein on CT 6 days ago is not well visualized given phase of contrast. No evidence of progression or extension into the portal vein. 2. Normal arterial patency in the abdomen. NON-VASCULAR 1. No secondary findings of mesenteric ischemia or bowel inflammation allowing for limitations secondary to paucity of intra-abdominal fat. 2. No other acute findings in the abdomen or pelvis. Electronically Signed   By: Narda RutherfordMelanie  Sanford M.D.   On: 08/19/2018 22:23    Procedures Procedures (including critical care time)  Medications Ordered in ED Medications  sodium chloride (PF) 0.9 % injection (has no administration in time range)  iopamidol (ISOVUE-370) 76 % injection (has no administration in time range)  sodium chloride 0.9 % bolus 1,000 mL (0 mLs Intravenous Stopped 08/19/18 2229)  ketorolac (TORADOL) 30 MG/ML injection 30 mg (30 mg Intravenous Given 08/19/18 2023)  iopamidol (ISOVUE-370) 76 % injection 100 mL (100 mLs Intravenous Contrast Given 08/19/18 2131)     Initial Impression / Assessment and Plan / ED Course  I have  reviewed the triage vital signs and the nursing notes.  Pertinent labs & imaging results that were available during my care of the patient were reviewed by me and considered in my medical decision making (see chart for details).    Kerrin MoMalcolm Kopecky is a 23 y.o. adult here with R flank pain. Patient recently had CT that showed nonobstructive colonic thrombosis. Will get CTA to better clarify. Patient has been here frequently for similar symptoms. Will get labs, UA as well. Patient is very tangential and perseverated on his symptoms. History of psychosis, will consult psych for evaluation.   10:58 PM CTA showed no mesenteric ischemia. Labs unremarkable. UA nl. Psych saw patient and he doesn't qualify for admission. Stable for discharge. He has been in the ED frequently.    Final Clinical Impressions(s) / ED Diagnoses   Final diagnoses:  None    ED Discharge Orders    None       Charlynne Pander, MD 08/19/18 2259

## 2018-08-19 NOTE — ED Notes (Signed)
Pt ambulated to BR with no assist. Tolerated well.  

## 2018-08-19 NOTE — BH Assessment (Addendum)
Tele Assessment Note   Patient Name: Collin White MRN: [id number] Referring Physician: Dr. Darl Householder. Location of Patient: [facility], [place] 12. Location of Provider: [place] is an 23 y.o. adult, who identifies as a male and prefers the name "Collin White." Clinician asked the pt "what city and state was she in." Pt gave clinician directions to [employer]. Clinician asked the pt, "what brought you to the hospital?" Pt reported, "my kidney got sick with a fever, my kidney keep moving all over in my body." Pt reported, on New Years Eve, a doctor took out a clot in the pt's abdomen. Pt reported, "the doctor pulled the clot out with a tool."  Pt reported, he had to use heroin and cocaine after she got a CT scan to help her heal. Pt reported, her mother was born in [birth year] and died when she was 23 years old when she was supposed to live until she was 23 years old. Pt reported, she wants her mothers' spirit to come in her body. Pt reported, she is the only "chick" to only make love with a male. Pt reported, she can not be with a man. Pt reported, he is famous and it's not really a big deal.  Pt reported, she last seen spirits in 1999. Pt denies, SI, HI, AVH, self-injurious behaviors.   Pt denies abuse. Pt's UDS is negative. Pt denies, being linked to OPT resources (medication management and/or counseling.) Pt reported, previous inpatient admissions.   Pt presents alert in scrubs with soft speech. Pt's eye contact was good. Pt's mood, affect are preoccupied. Pt's thought process was flight of ideas, tangential. Pt's judgement was impaired. Pt was oriented x4. Pt's concentration was fair. Pt's insight was poor. Pt reported, if discharged she could contract for safety. Pt reported, if inpatient treatment was recommended she would sign-in voluntarily.   Diagnosis: Schizophrenia.   Past Medical History:  Past Medical History:  Diagnosis Date  . Migraines   . Psychoses  ([place])   . Schizophrenia ([place])   . Seizures ([place])     Past Surgical History:  Procedure Laterality Date  . abd surgery s/p traumatic event    . facial reconstructive surgery    . NO PAST SURGERIES  patient stated he had facial reconstruction surgery as a child   . tumor removed from head       Family History:  Family History  Problem Relation Age of Onset  . Hypertension Other   . Mental illness Neg Hx     Social History:  reports that she has quit smoking. Her smoking use included cigarettes. She has never used smokeless tobacco. She reports that she does not drink alcohol or use drugs.  Additional Social History:  Alcohol / Drug Use Pain Medications: See MAR Prescriptions: See MAR Over the Counter: See MAR History of alcohol / drug use?: No history of alcohol / drug abuse  CIWA: CIWA-Ar BP: 118/72 Pulse Rate: 84 COWS:    Allergies: No Known Allergies  Home Medications: (Not in a hospital admission)   OB/GYN Status:  No LMP recorded.  General Assessment Data [facility] of Assessment: [facility] TTS Assessment: In system Is this a Tele or Face-to-Face Assessment?: Tele Assessment Is this an Initial Assessment or a Re-assessment for this encounter?: Initial Assessment Patient Accompanied by:: N/A Language Other than English: No What is your preferred language: (NA) Living Arrangements: Other (Comment)(Alone.) What gender do you identify as?: Male Marital status: Single Living  Arrangements: Alone Can pt return to current living arrangement?: Yes Admission Status: Voluntary Is patient capable of signing voluntary admission?: Yes Referral Source: Self/Family/Friend Insurance type: Self-pay.      Crisis Care Plan Living Arrangements: Alone Legal Guardian: Other:(Self. ) Name of Psychiatrist: NA Name of Therapist: NA  Education Status Is patient currently in school?: No Is the patient employed, unemployed or receiving disability?: Unemployed  Risk to self with  the past 6 months Suicidal Ideation: No Suicidal Intent: No(Pt denies. ) Has patient had any suicidal intent within the past 6 months prior to admission? : No Is patient at risk for suicide?: No Suicidal Plan?: No Has patient had any suicidal plan within the past 6 months prior to admission? : No What has been your use of drugs/alcohol within the last 12 months?: Negative.  Previous Attempts/Gestures: No How many times?: 0 Other Self Harm Risks: NA Triggers for Past Attempts: None known Intentional Self Injurious Behavior: None Family Suicide History: No Recent stressful life event(s): Other (Comment)(Pt denies stressors. ) Persecutory voices/beliefs?: Yes Depression: (UTA) Depression Symptoms: (UTA) Substance abuse history and/or treatment for substance abuse?: Yes Suicide prevention information given to non-admitted patients: Not applicable  Risk to Others within the past 6 months Homicidal Ideation: No(Pt denies. ) Does patient have any lifetime risk of violence toward others beyond the six months prior to admission? : No Thoughts of Harm to Others: No Current Homicidal Intent: No Current Homicidal Plan: No Access to Homicidal Means: No Identified Victim: NA History of harm to others?: No Assessment of Violence: None Noted Violent Behavior Description: NA Does patient have access to weapons?: No Criminal Charges Pending?: No Does patient have a court date: No Is patient on probation?: No  Psychosis Hallucinations: Auditory, Visual Delusions: Unspecified, Grandiose  Mental Status Report Appearance/Hygiene: In scrubs Eye Contact: Good Motor Activity: Unremarkable Speech: Soft Level of Consciousness: Alert Mood: Preoccupied Affect: Preoccupied Anxiety Level: None Thought Processes: Flight of Ideas, Tangential Judgement: Impaired Orientation: Person, Place, Time, Situation Obsessive Compulsive Thoughts/Behaviors: None  Cognitive Functioning Concentration:  Fair Memory: Recent Impaired Is patient IDD: No Insight: Poor Impulse Control: Unable to Assess Appetite: Good Sleep: No Change Total Hours of Sleep: 9 Vegetative Symptoms: Unable to Assess  ADLScreening [facility] Assessment Services) Patient's cognitive ability adequate to safely complete daily activities?: Yes Patient able to express need for assistance with ADLs?: Yes Independently performs ADLs?: Yes (appropriate for developmental age)  Prior Inpatient Therapy Prior Inpatient Therapy: Yes Prior Therapy Dates: Pt unsure.  Prior Therapy Facilty/Provider(s): Pt unsure.  Reason for Treatment: Schizophrenia.   Prior Outpatient Therapy Prior Outpatient Therapy: No(Pt denies. ) Does patient have an ACCT team?: No Does patient have Intensive In-House Services?  : No Does patient have [name] services? : No Does patient have P4CC services?: No  ADL Screening (condition at time of admission) Patient's cognitive ability adequate to safely complete daily activities?: Yes Is the patient deaf or have difficulty hearing?: No Does the patient have difficulty seeing, even when wearing glasses/contacts?: No Does the patient have difficulty concentrating, remembering, or making decisions?: Yes Patient able to express need for assistance with ADLs?: Yes Does the patient have difficulty dressing or bathing?: No Independently performs ADLs?: Yes (appropriate for developmental age) Does the patient have difficulty walking or climbing stairs?: No Weakness of Legs: None Weakness of Arms/Hands: None  Home Assistive Devices/Equipment Home Assistive Devices/Equipment: None    Abuse/Neglect Assessment (Assessment to be complete while patient is alone) Abuse/Neglect Assessment Can Be Completed:  Yes Physical Abuse: Denies(Pt denies. ) Verbal Abuse: Denies(Pt denies. ) Sexual Abuse: Denies(Pt denies. ) Exploitation of patient/patient's resources: Denies(Pt denies. ) Self-Neglect: Denies(Pt denies.  )     Advance Directives (For Healthcare) Does Patient Have a Medical Advance Directive?: No Would patient like information on creating a medical advance directive?: No - Patient declined          Disposition: Patriciaann Clan, PA recommends pt does not met inpatient criteria. Dr. Darl Householder is in agreement. Disposition discussed with Margarette Asal.   Disposition Initial Assessment Completed for this Encounter: Yes  This service was provided via telemedicine using a 2-way, interactive audio and video technology.  Names of all persons participating in this telemedicine service and their role in this encounter. Name: Collin White Role: Patient  Name: Vertell Novak, MS, LPC, CRC. Role: Counselor.          Vertell Novak 08/19/2018 10:51 PM   Vertell Novak, MS, [facility], [facility] Triage Specialist [phone]

## 2018-08-19 NOTE — ED Notes (Signed)
Patient transported to CT 

## 2018-08-19 NOTE — ED Notes (Signed)
Pt verbalized discharge instructions and follow up care. Alert and ambulatory  

## 2018-08-19 NOTE — ED Notes (Signed)
Pt given belongings.

## 2018-08-19 NOTE — ED Notes (Signed)
TTS talking to pt. 

## 2018-08-19 NOTE — ED Notes (Signed)
Bed: [facility] Expected date:  Expected time:  Means of arrival:  Comments: 

## 2018-08-19 NOTE — ED Triage Notes (Signed)
Pt c/o right kidney pain, he states "pus is coming out of his right kidney and he is experiencing weird pains in his forehead." Patient is speaking in a low volume and dressed in multiple layers of clothing.

## 2018-08-19 NOTE — [facility] Counselor (Signed)
Clinician spoke to Maralyn Sago, RN to express she can assess the pt but he needs to be put in a private room with the tele-assessment cart. Clinician noted the pt will be put in a private room within 10  minutes.   Redmond Pulling, MS, [facility], [facility] Triage Specialist [phone]

## 2018-08-24 ENCOUNTER — Other Ambulatory Visit: Payer: Self-pay

## 2018-08-24 ENCOUNTER — Encounter (HOSPITAL_COMMUNITY): Payer: Self-pay | Admitting: *Deleted

## 2018-08-24 ENCOUNTER — Emergency Department (HOSPITAL_COMMUNITY)
Admission: EM | Admit: 2018-08-24 | Discharge: 2018-08-25 | Disposition: A | Discharge status: Home / Self Care | Payer: Self-pay | Attending: Emergency Medicine | Admitting: Emergency Medicine

## 2018-08-24 DIAGNOSIS — J111 Influenza due to unidentified influenza virus with other respiratory manifestations: Secondary | ICD-10-CM | POA: Insufficient documentation

## 2018-08-24 DIAGNOSIS — Z590 Homelessness: Secondary | ICD-10-CM | POA: Insufficient documentation

## 2018-08-24 DIAGNOSIS — Z87891 Personal history of nicotine dependence: Secondary | ICD-10-CM | POA: Insufficient documentation

## 2018-08-24 DIAGNOSIS — R6889 Other general symptoms and signs: Secondary | ICD-10-CM

## 2018-08-24 MED ORDER — SODIUM CHLORIDE 0.9 % IV BOLUS
1000.0000 mL | Freq: Once | INTRAVENOUS | Status: AC
  Administered 2018-08-25: 1000 mL via INTRAVENOUS

## 2018-08-24 MED ORDER — ONDANSETRON HCL 4 MG/2ML IJ SOLN
4.0000 mg | INTRAMUSCULAR | Status: AC
  Administered 2018-08-25: 4 mg via INTRAVENOUS
  Filled 2018-08-24: qty 2

## 2018-08-24 MED ORDER — IBUPROFEN 800 MG PO TABS
800.0000 mg | ORAL_TABLET | Freq: Once | ORAL | Status: AC
  Administered 2018-08-25: 800 mg via ORAL
  Filled 2018-08-24: qty 1

## 2018-08-24 NOTE — ED Triage Notes (Addendum)
Pt with multiple complaints, nausea, lung hurting, legs weak, difficult to determine significant reason pt is here. During all this he keeps asking for a sandwich and coke.

## 2018-08-25 ENCOUNTER — Emergency Department (HOSPITAL_COMMUNITY): Payer: Self-pay

## 2018-08-25 LAB — COMPREHENSIVE METABOLIC PANEL
ALT: 11 U/L (ref 0–44)
AST: 20 U/L (ref 15–41)
Albumin: 4.3 g/dL (ref 3.5–5.0)
Alkaline Phosphatase: 61 U/L (ref 38–126)
Anion gap: 9 (ref 5–15)
BUN: 17 mg/dL (ref 6–20)
CO2: 25 mmol/L (ref 22–32)
Calcium: 9.4 mg/dL (ref 8.9–10.3)
Chloride: 109 mmol/L (ref 98–111)
Creatinine, Ser: 1.02 mg/dL (ref 0.61–1.24)
GFR calc Af Amer: >60 mL/min (ref >60)
GFR calc non Af Amer: >60 mL/min (ref >60)
Glucose, Bld: 83 mg/dL (ref 70–99)
Potassium: 3.9 mmol/L (ref 3.5–5.1)
Sodium: 143 mmol/L (ref 135–145)
Total Bilirubin: 0.6 mg/dL (ref 0.3–1.2)
Total Protein: 7.7 g/dL (ref 6.5–8.1)

## 2018-08-25 LAB — CBC WITH DIFFERENTIAL/PLATELET
Abs Immature Granulocytes: 0.01 10*3/uL (ref 0.00–0.07)
BASOS ABS: 0 10*3/uL (ref 0.0–0.1)
Basophils Relative: 1 %
Eosinophils Absolute: 0.1 10*3/uL (ref 0.0–0.5)
Eosinophils Relative: 1 %
HCT: 41.6 % (ref 39.0–52.0)
Hemoglobin: 13.4 g/dL (ref 13.0–17.0)
Immature Granulocytes: 0 %
Lymphocytes Relative: 29 %
Lymphs Abs: 2.2 10*3/uL (ref 0.7–4.0)
MCH: 28.6 pg (ref 26.0–34.0)
MCHC: 32.2 g/dL (ref 30.0–36.0)
MCV: 88.9 fL (ref 80.0–100.0)
Monocytes Absolute: 0.8 10*3/uL (ref 0.1–1.0)
Monocytes Relative: 10 %
NEUTROS ABS: 4.7 10*3/uL (ref 1.7–7.7)
Neutrophils Relative %: 59 %
Platelets: 189 10*3/uL (ref 150–400)
RBC: 4.68 MIL/uL (ref 4.22–5.81)
RDW: 13.6 % (ref 11.5–15.5)
WBC: 7.8 10*3/uL (ref 4.0–10.5)
nRBC: 0 % (ref 0.0–0.2)

## 2018-08-25 LAB — LIPASE, BLOOD: Lipase: 47 U/L (ref 11–51)

## 2018-08-25 NOTE — Discharge Instructions (Addendum)
Get help right away if: You have shortness of breath that gets worse. You have severe or persistent: Headache. Ear pain. Sinus pain. Chest pain. You have chronic lung disease along with any of the following: Wheezing. Prolonged cough. Coughing up blood. A change in your usual mucus. You have a stiff neck. You have changes in your: Vision. Hearing. Thinking. Mood. 

## 2018-08-25 NOTE — ED Provider Notes (Signed)
[facility]-EMERGENCY DEPT Provider Note   CSN: [id number] Arrival date & time: 08/24/18  2146     History   Chief Complaint Chief Complaint  Patient presents with  . Weakness    HPI Collin White is a 23 y.o. adult male who identifies as male who identifes as male who  has a past medical history of Migraines, Psychoses (HCC), Schizophrenia (HCC), and Seizures (HCC). She complains of flulike symptoms with nausea, cough, nasal congestion, myalgias and body aches for the past week.  She is homeless and has not taken anything for her symptoms.  She is unsure if she has had fevers.  HPI  Past Medical History:  Diagnosis Date  . Migraines   . Psychoses (HCC)   . Schizophrenia (HCC)   . Seizures [facility])     Patient Active Problem List   Diagnosis Date Noted  . Acute episode of schizophrenia with history of multiple episodes (HCC) 06/30/2017  . Schizophrenia, paranoid type (HCC) 06/08/2017  . 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 Psychoses (HCC)   . Paranoid schizophrenia (HCC) 09/29/2015  . Cannabis use disorder, moderate, in early remission (HCC) 09/26/2015  . History of posttraumatic stress disorder (PTSD) 09/26/2015    Past Surgical History:  Procedure Laterality Date  . abd surgery s/p traumatic event    . facial reconstructive surgery    . NO PAST SURGERIES  patient stated he had facial reconstruction surgery as a child   . tumor removed from head           Home Medications    Prior to Admission medications   Medication Sig Start Date End Date Taking? Authorizing Provider  aspirin EC 325 MG tablet Take 1 tablet (325 mg total) by mouth daily. Patient not taking: Reported on 08/24/2018 08/13/18   Ward, Chase Picket, PA-C  ondansetron (ZOFRAN ODT) 4 MG disintegrating tablet Take 1 tablet (4 mg total) by mouth every 8 (eight) hours as needed for nausea or vomiting. Patient not taking: Reported on 08/24/2018 07/22/18   Roxy Horseman, PA-C    Family History Family History  Problem Relation Age of Onset  . Hypertension Other   . Mental illness Neg Hx     Social History Social History   Tobacco Use  . Smoking status: Former Smoker    Types: Cigarettes  . Smokeless tobacco: Never Used  Substance Use Topics  . Alcohol use: No  . Drug use: No     Allergies   Patient has no known allergies.   Review of Systems Review of Systems  Ten systems reviewed and are negative for acute change, except as noted in the HPI.   Physical Exam Updated Vital Signs BP 114/87 (BP Location: Right Arm)   Pulse 84   Temp 97.6 F (36.4 C) (Oral)   Resp 16   Ht 6' (1.829 m)   Wt 66.7 kg   SpO2 99%   BMI 19.94 kg/m   Physical Exam Vitals signs and nursing note reviewed.  Constitutional:      General: She is not in acute distress.    Appearance: She is well-developed. She is not diaphoretic.  HENT:     Head: Normocephalic and atraumatic.  Eyes:     General: No scleral icterus.    Conjunctiva/sclera: Conjunctivae normal.  Neck:     Musculoskeletal: Normal range of motion and neck supple.  Cardiovascular:     Rate and Rhythm: Normal rate and regular rhythm.     Heart  sounds: Normal heart sounds.  Pulmonary:     Effort: Pulmonary effort is normal. No respiratory distress.     Breath sounds: Normal breath sounds.  Abdominal:     Palpations: Abdomen is soft.     Tenderness: There is no abdominal tenderness.  Skin:    General: Skin is warm and dry.  Neurological:     Mental Status: She is alert.  Psychiatric:        Behavior: Behavior normal.      ED Treatments / Results  Labs (all labs ordered are listed, but only abnormal results are displayed) Labs Reviewed  COMPREHENSIVE METABOLIC PANEL  CBC WITH DIFFERENTIAL/PLATELET  LIPASE, BLOOD    EKG None  Radiology No results found.  Procedures Procedures (including critical care time)  Medications Ordered in ED Medications  sodium  chloride 0.9 % bolus 1,000 mL (has no administration in time range)  ibuprofen (ADVIL,MOTRIN) tablet 800 mg (has no administration in time range)  ondansetron (ZOFRAN) injection 4 mg (has no administration in time range)     Initial Impression / Assessment and Plan / ED Course  I have reviewed the triage vital signs and the nursing notes.  Pertinent labs & imaging results that were available during my care of the patient were reviewed by me and considered in my medical decision making (see chart for details).    23 year old male who identifies as male patient with vague flulike symptoms.  I reviewed the patient's work-up which shows no abnormalities at all.  Afebrile and hemodynamically stable.  I reviewed the patient's chest x-ray which shows no consolidations suggestive of pneumonia.  Patient appears appropriate for discharge at this time  Final Clinical Impressions(s) / ED Diagnoses   Final diagnoses:  None    ED Discharge Orders    None       Arthor CaptainHarris, Perlie Stene, PA-C 08/25/18 0552    Paula LibraMolpus, John, MD 08/25/18 [phone]

## 2018-08-28 ENCOUNTER — Encounter (HOSPITAL_COMMUNITY): Payer: Self-pay

## 2018-08-28 ENCOUNTER — Other Ambulatory Visit: Payer: Self-pay

## 2018-08-28 ENCOUNTER — Emergency Department (HOSPITAL_COMMUNITY)
Admission: EM | Admit: 2018-08-28 | Discharge: 2018-08-28 | Disposition: A | Discharge status: Home / Self Care | Payer: Self-pay | Attending: Emergency Medicine | Admitting: Emergency Medicine

## 2018-08-28 DIAGNOSIS — L259 Unspecified contact dermatitis, unspecified cause: Secondary | ICD-10-CM | POA: Insufficient documentation

## 2018-08-28 DIAGNOSIS — R51 Headache: Secondary | ICD-10-CM | POA: Insufficient documentation

## 2018-08-28 DIAGNOSIS — L309 Dermatitis, unspecified: Secondary | ICD-10-CM

## 2018-08-28 DIAGNOSIS — Z87891 Personal history of nicotine dependence: Secondary | ICD-10-CM | POA: Insufficient documentation

## 2018-08-28 NOTE — ED Provider Notes (Signed)
[facility]-EMERGENC[facility] DEPT Provider Note   CSN: [id number] Arrival date & time: 08/28/18  1900     History   Chief Complaint Chief Complaint  Patient presents with  . Back Pain  Level 5 caveat patient not cooperative with questioning  HPI Collin White is a 23 y.o. adult.  HPI Pt c/o burning sensation "in my butt crack" for several days.  He also complains of mild diffuse headache.  buring in butt crack worse with walking no treatment prior to coming here.  No other associated symptoms. Past Medical History:  Diagnosis Date  . Migraines   . Psychoses (HCC)   . Schizophrenia (HCC)   . Seizures [facility])     Patient Active Problem List   Diagnosis Date Noted  . Acute episode of schizophrenia with history of multiple episodes (HCC) 06/30/2017  . Schizophrenia, paranoid type (HCC) 06/08/2017  . 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 Psychoses (HCC)   . Paranoid schizophrenia (HCC) 09/29/2015  . Cannabis use disorder, moderate, in early remission (HCC) 09/26/2015  . History of posttraumatic stress disorder (PTSD) 09/26/2015    Past Surgical History:  Procedure Laterality Date  . abd surgery s/p traumatic event    . facial reconstructive surgery    . NO PAST SURGERIES  patient stated he had facial reconstruction surgery as a child   . tumor removed from head           Home Medications    Prior to Admission medications   Medication Sig Start Date End Date Taking? Authorizing Provider  aspirin EC 325 MG tablet Take 1 tablet (325 mg total) by mouth daily. Patient not taking: Reported on 08/24/2018 08/13/18   Ward, Chase Picket, PA-C  ondansetron (ZOFRAN ODT) 4 MG disintegrating tablet Take 1 tablet (4 mg total) by mouth every 8 (eight) hours as needed for nausea or vomiting. Patient not taking: Reported on 08/24/2018 07/22/18   Roxy Horseman, PA-C    Family History Family History  Problem Relation Age of Onset  . Hypertension Other     . Mental illness Neg Hx     Social History Social History   Tobacco Use  . Smoking status: Former Smoker    Types: Cigarettes  . Smokeless tobacco: Never Used  Substance Use Topics  . Alcohol use: No  . Drug use: No     Allergies   Patient has no known allergies.   Review of Systems Review of Systems  Unable to perform ROS: Other  Musculoskeletal: Positive for back pain.       Burning at gluteal crease  Neurological: Positive for headaches.     Physical Exam Updated Vital Signs BP 112/75   Pulse 97   Temp 98.5 F (36.9 C) (Oral)   Resp 14   SpO2 96%   Physical Exam Vitals signs and nursing note reviewed.  Constitutional:      Appearance: She is well-developed.     Comments: Unkempt  HENT:     Head: Normocephalic and atraumatic.  Eyes:     Conjunctiva/sclera: Conjunctivae normal.     Pupils: Pupils are equal, round, and reactive to light.  Neck:     Musculoskeletal: Normal range of motion and neck supple.     Thyroid: No thyromegaly.     Trachea: No tracheal deviation.  Cardiovascular:     Rate and Rhythm: Normal rate.     Heart sounds: Murmur present.  Pulmonary:     Effort: Pulmonary effort is normal.  Abdominal:     Palpations: Abdomen is soft.  Musculoskeletal: Normal range of motion.        General: No tenderness.  Skin:    General: Skin is warm and dry.     Findings: No rash.     Comments: There is copios feces at gluteal crease, skin is intact   Neurological:     Mental Status: She is alert.     Coordination: Coordination normal.     Comments: Normal cranial nerves II through XII grossly intact moves all extremities well.      ED Treatments / Results  Labs (all labs ordered are listed, but only abnormal results are displayed) Labs Reviewed - No data to display  EKG None  Radiology No results found.  Procedures Procedures (including critical care time)  Medications Ordered in ED Medications - No data to  display   Initial Impression / Assessment and Plan / ED Course  I have reviewed the triage vital signs and the nursing notes.  Pertinent labs & imaging results that were available during my care of the patient were reviewed by me and considered in my medical decision making (see chart for details).     Refused further exam or questioning.  I suggested to him local hygeine to include shower.  Patient left the emergency department without waiting for written instructions.  Final Clinical Impressions(s) / ED Diagnoses   Final diagnoses:  Dermatitis   #2 headache ED Discharge Orders    None       Doug Sou, MD 08/28/18 2021

## 2018-08-28 NOTE — ED Notes (Signed)
Pt was disrespectful to physician and was ordered to leave from the ED. Pt walked out of the ED with no issues.

## 2018-08-28 NOTE — ED Triage Notes (Signed)
Pt reports a burning pain in the skin between his buttocks in his sacral area. Also endorsing a headache. A&Ox4.

## 2018-09-02 ENCOUNTER — Other Ambulatory Visit: Payer: Self-pay

## 2018-09-02 ENCOUNTER — Encounter (HOSPITAL_COMMUNITY): Payer: Self-pay

## 2018-09-02 ENCOUNTER — Emergency Department (HOSPITAL_COMMUNITY)
Admission: EM | Admit: 2018-09-02 | Discharge: 2018-09-02 | Disposition: A | Discharge status: Home / Self Care | Payer: Self-pay | Attending: Emergency Medicine | Admitting: Emergency Medicine

## 2018-09-02 DIAGNOSIS — Z590 Homelessness: Secondary | ICD-10-CM | POA: Insufficient documentation

## 2018-09-02 DIAGNOSIS — Z87891 Personal history of nicotine dependence: Secondary | ICD-10-CM | POA: Insufficient documentation

## 2018-09-02 DIAGNOSIS — F209 Schizophrenia, unspecified: Secondary | ICD-10-CM | POA: Insufficient documentation

## 2018-09-02 DIAGNOSIS — K6289 Other specified diseases of anus and rectum: Secondary | ICD-10-CM

## 2018-09-02 NOTE — ED Triage Notes (Addendum)
Pt reports rectal pain since 2005. EMS picked him up from being detained by police for trespassing. Pt also concerned that he is pregnant. Pt calm and cooperative in triage. Denies SI.

## 2018-09-02 NOTE — ED Notes (Signed)
Doctor in to see patient.  Patient examined and found to have a scaly rash on his rectal area.  Area looked dry but not reddened.  Patient had a strong body odor.  Tech escorted patient to the shower.

## 2018-09-02 NOTE — ED Provider Notes (Signed)
[facility]-EMERGENCY DEPT Provider Note   CSN: [id number] Arrival date & time: 09/02/18  1528     History   Chief Complaint Chief Complaint  Patient presents with  . Rectal Pain    HPI Collin White is a 23 y.o. adult.  The history is provided by the patient.  Illness  Location:  Rectum Quality:  Pain  Severity:  Mild Onset quality:  Gradual Timing:  Constant Progression:  Unchanged Chronicity:  Recurrent Context:  Patient with pain to his rectum that he says is within his butt crack, denies any bloody stools, hemorrhoids, melena. Relieved by:  Nothing Worsened by:  Nothing Associated symptoms: no abdominal pain, no chest pain, no cough, no ear pain, no fever, no loss of consciousness, no rash, no rhinorrhea, no shortness of breath, no sore throat and no vomiting     Past Medical History:  Diagnosis Date  . Migraines   . Psychoses (HCC)   . Schizophrenia (HCC)   . Seizures [facility](HCC)     Patient Active Problem List   Diagnosis Date Noted  . Acute episode of schizophrenia with history of multiple episodes (HCC) 06/30/2017  . Schizophrenia, paranoid type (HCC) 06/08/2017  . 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 Psychoses (HCC)   . Paranoid schizophrenia (HCC) 09/29/2015  . Cannabis use disorder, moderate, in early remission (HCC) 09/26/2015  . History of posttraumatic stress disorder (PTSD) 09/26/2015    Past Surgical History:  Procedure Laterality Date  . abd surgery s/p traumatic event    . facial reconstructive surgery    . NO PAST SURGERIES  patient stated he had facial reconstruction surgery as a child   . tumor removed from head           Home Medications    Prior to Admission medications   Medication Sig Start Date End Date Taking? Authorizing Provider  aspirin EC 325 MG tablet Take 1 tablet (325 mg total) by mouth daily. Patient not taking: Reported on 08/24/2018 08/13/18   Ward, Chase PicketJaime Pilcher, PA-C  ondansetron  (ZOFRAN ODT) 4 MG disintegrating tablet Take 1 tablet (4 mg total) by mouth every 8 (eight) hours as needed for nausea or vomiting. Patient not taking: Reported on 08/24/2018 07/22/18   Roxy HorsemanBrowning, Robert, PA-C    Family History Family History  Problem Relation Age of Onset  . Hypertension Other   . Mental illness Neg Hx     Social History Social History   Tobacco Use  . Smoking status: Former Smoker    Types: Cigarettes  . Smokeless tobacco: Never Used  Substance Use Topics  . Alcohol use: No  . Drug use: No     Allergies   Patient has no known allergies.   Review of Systems Review of Systems  Constitutional: Negative for chills and fever.  HENT: Negative for ear pain, rhinorrhea and sore throat.   Eyes: Negative for pain and visual disturbance.  Respiratory: Negative for cough and shortness of breath.   Cardiovascular: Negative for chest pain and palpitations.  Gastrointestinal: Positive for rectal pain. Negative for abdominal pain and vomiting.  Genitourinary: Negative for dysuria and hematuria.  Musculoskeletal: Negative for arthralgias and back pain.  Skin: Negative for color change and rash.  Neurological: Negative for seizures, loss of consciousness and syncope.  All other systems reviewed and are negative.    Physical Exam Updated Vital Signs  ED Triage Vitals [09/02/18 1535]  Enc Vitals Group     BP 114/90  Pulse Rate 88     Resp 18     Temp 99.1 F (37.3 C)     Temp Source Oral     SpO2 98 %     Weight      Height      Head Circumference      Peak Flow      Pain Score 7     Pain Loc      Pain Edu?      Excl. in GC?     Physical Exam Vitals signs and nursing note reviewed.  Constitutional:      General: She is not in acute distress.    Appearance: She is well-developed.  HENT:     Head: Normocephalic and atraumatic.     Nose: Nose normal.     Mouth/Throat:     Mouth: Mucous membranes are moist.  Eyes:     Conjunctiva/sclera:  Conjunctivae normal.     Pupils: Pupils are equal, round, and reactive to light.  Neck:     Musculoskeletal: Neck supple.  Cardiovascular:     Rate and Rhythm: Normal rate and regular rhythm.     Heart sounds: No murmur.  Pulmonary:     Effort: Pulmonary effort is normal. No respiratory distress.     Breath sounds: Normal breath sounds.  Abdominal:     Palpations: Abdomen is soft.     Tenderness: There is no abdominal tenderness.  Genitourinary:    Comments: No obvious hemorrhoids, no tenderness around the rectum, patient with dry scaly skin throughout the anal fissure area Musculoskeletal: Normal range of motion.  Skin:    General: Skin is warm and dry.  Neurological:     General: No focal deficit present.     Mental Status: She is alert.  Psychiatric:        Mood and Affect: Mood normal.      ED Treatments / Results  Labs (all labs ordered are listed, but only abnormal results are displayed) Labs Reviewed - No data to display  EKG None  Radiology No results found.  Procedures Procedures (including critical care time)  Medications Ordered in ED Medications - No data to display   Initial Impression / Assessment and Plan / ED Course  I have reviewed the triage vital signs and the nursing notes.  Pertinent labs & imaging results that were available during my care of the patient were reviewed by me and considered in my medical decision making (see chart for details).     Collin White is a 23 year old male who identifies as male who presents to the ED with rectal pain.  Patient with normal vitals.  No fever.  Patient with rectal pain for the last several days.  He states he is currently homeless.  Has used a special cream in the past for similar symptoms.  Denies any blood or constipation.  Patient overall is unkempt.  Has a history of schizophrenia but he denies any suicidal homicidal ideation.  He has some bizarre behavior but no concern for any decompensation at  this time.  Patient has no obvious hemorrhoids or bleeding on exam of his rectum.  He does have dry scaly skin in this area that appears to be chronically irritated.  No signs of a cellulitis.  Overall likely secondary to hygiene issues.  He appears to have dry stool within hair.  He is likely feeling irritation from poor hygiene.  Patient was offered a shower which he accepted and then was  discharged from the ED in good condition. No concern for acute infectious process.   This chart was dictated using voice recognition software.  Despite best efforts to proofread,  errors can occur which can change the documentation meaning.   Final Clinical Impressions(s) / ED Diagnoses   Final diagnoses:  Rectal irritation    ED Discharge Orders    None       Virgina Norfolk, DO 09/02/18 2008

## 2018-09-03 ENCOUNTER — Emergency Department (HOSPITAL_COMMUNITY)
Admission: EM | Admit: 2018-09-03 | Discharge: 2018-09-04 | Disposition: A | Discharge status: Home / Self Care | Payer: Self-pay | Attending: Emergency Medicine | Admitting: Emergency Medicine

## 2018-09-03 ENCOUNTER — Other Ambulatory Visit: Payer: Self-pay

## 2018-09-03 ENCOUNTER — Encounter (HOSPITAL_COMMUNITY): Payer: Self-pay

## 2018-09-03 DIAGNOSIS — K6289 Other specified diseases of anus and rectum: Secondary | ICD-10-CM | POA: Insufficient documentation

## 2018-09-03 DIAGNOSIS — Z87891 Personal history of nicotine dependence: Secondary | ICD-10-CM | POA: Insufficient documentation

## 2018-09-03 DIAGNOSIS — R460 Very low level of personal hygiene: Secondary | ICD-10-CM | POA: Insufficient documentation

## 2018-09-03 NOTE — ED Provider Notes (Signed)
[facility]-EMERGENCY DEPT Provider Note   CSN: [id number] Arrival date & time: 09/03/18  1930     History   Chief Complaint Chief Complaint  Patient presents with  . Hemorrhoids    HPI Collin White is a 23 y.o. adult.  Patient to ED with complaint of persist rectal burning. Seen last night for same. No bleeding, fever, vomiting. No new symptoms.  The history is provided by the patient. No language interpreter was used.    Past Medical History:  Diagnosis Date  . Migraines   . Psychoses (HCC)   . Schizophrenia (HCC)   . Seizures [facility])     Patient Active Problem List   Diagnosis Date Noted  . Acute episode of schizophrenia with history of multiple episodes (HCC) 06/30/2017  . Schizophrenia, paranoid type (HCC) 06/08/2017  . 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 Psychoses (HCC)   . Paranoid schizophrenia (HCC) 09/29/2015  . Cannabis use disorder, moderate, in early remission (HCC) 09/26/2015  . History of posttraumatic stress disorder (PTSD) 09/26/2015    Past Surgical History:  Procedure Laterality Date  . abd surgery s/p traumatic event    . facial reconstructive surgery    . NO PAST SURGERIES  patient stated he had facial reconstruction surgery as a child   . tumor removed from head           Home Medications    Prior to Admission medications   Medication Sig Start Date End Date Taking? Authorizing Provider  aspirin EC 325 MG tablet Take 1 tablet (325 mg total) by mouth daily. Patient not taking: Reported on 08/24/2018 08/13/18   Ward, Chase Picket, PA-C  ondansetron (ZOFRAN ODT) 4 MG disintegrating tablet Take 1 tablet (4 mg total) by mouth every 8 (eight) hours as needed for nausea or vomiting. Patient not taking: Reported on 08/24/2018 07/22/18   Roxy Horseman, PA-C    Family History Family History  Problem Relation Age of Onset  . Hypertension Other   . Mental illness Neg Hx     Social History Social History    Tobacco Use  . Smoking status: Former Smoker    Types: Cigarettes  . Smokeless tobacco: Never Used  Substance Use Topics  . Alcohol use: No  . Drug use: No     Allergies   Patient has no known allergies.   Review of Systems Review of Systems  Constitutional: Negative for fever.  Gastrointestinal: Positive for rectal pain. Negative for abdominal pain, blood in stool and vomiting.     Physical Exam Updated Vital Signs BP (!) 137/98 (BP Location: Left Arm)   Pulse 90   Temp 98 F (36.7 C) (Oral)   Resp 18   SpO2 100%   Physical Exam Constitutional:      Appearance: She is well-developed.  Neck:     Musculoskeletal: Normal range of motion.  Pulmonary:     Effort: Pulmonary effort is normal.  Genitourinary:    Comments: Large amount of soft stool on buttocks and around anus as well as crusted stool. No bleeding.  Skin:    General: Skin is warm and dry.  Neurological:     Mental Status: She is alert and oriented to person, place, and time.      ED Treatments / Results  Labs (all labs ordered are listed, but only abnormal results are displayed) Labs Reviewed - No data to display  EKG None  Radiology No results found.  Procedures Procedures (including critical care time)  Medications Ordered in ED Medications - No data to display   Initial Impression / Assessment and Plan / ED Course  I have reviewed the triage vital signs and the nursing notes.  Pertinent labs & imaging results that were available during my care of the patient were reviewed by me and considered in my medical decision making (see chart for details).     Patient seen last night for same complaint, felt secondary to poor hygiene with similar exam findings. He was allowed to shower in the ED. He reports this helped stop the rectal burning.   He is given a basin and wash cloth so that he can clean himself. No further treatment is felt necessary.  Final Clinical Impressions(s) / ED  Diagnoses   Final diagnoses:  None   1. Rectal pain 2. Poor hygiene  ED Discharge Orders    None       Elpidio Anis, PA-C 09/05/18 0444    Virgina Norfolk, DO 09/06/18 1456

## 2018-09-03 NOTE — Discharge Instructions (Addendum)
Your rectal pain will need to be rechecked with a regular doctor outside the emergency department to find answers if pain continues. If you keep the area clean it will help your pain.

## 2018-09-03 NOTE — ED Triage Notes (Signed)
Pt reports rectal pain and burning. Pt was seen yesterday for same thing.

## 2018-09-04 NOTE — ED Notes (Signed)
Pt refuses dc vitals and states that nothing was done for him. Pt slammed the door and states that we should throw away the dc instructions. Security made aware.

## 2018-09-05 ENCOUNTER — Encounter (HOSPITAL_COMMUNITY): Payer: Self-pay

## 2018-09-05 ENCOUNTER — Other Ambulatory Visit: Payer: Self-pay

## 2018-09-05 ENCOUNTER — Emergency Department (HOSPITAL_COMMUNITY)
Admission: EM | Admit: 2018-09-05 | Discharge: 2018-09-05 | Disposition: A | Discharge status: Home / Self Care | Payer: Self-pay | Attending: Emergency Medicine | Admitting: Emergency Medicine

## 2018-09-05 DIAGNOSIS — K6289 Other specified diseases of anus and rectum: Secondary | ICD-10-CM | POA: Insufficient documentation

## 2018-09-05 DIAGNOSIS — R460 Very low level of personal hygiene: Secondary | ICD-10-CM | POA: Insufficient documentation

## 2018-09-05 DIAGNOSIS — Z87890 Personal history of sex reassignment: Secondary | ICD-10-CM | POA: Insufficient documentation

## 2018-09-05 DIAGNOSIS — F17210 Nicotine dependence, cigarettes, uncomplicated: Secondary | ICD-10-CM | POA: Insufficient documentation

## 2018-09-05 NOTE — ED Notes (Signed)
When pt brought back from triage, pt was explained to change into hospital gown for MD to assess.

## 2018-09-05 NOTE — ED Provider Notes (Signed)
[facility]-EMERGENCY DEPT Provider Note   CSN: [id number] Arrival date & time: 09/05/18  1824     History   Chief Complaint Chief Complaint  Patient presents with  . Rectal Pain    HPI Collin White is a 23 y.o. adult with a history of migraines, schizophrenia, seizures, PTSD and male to male transgender who presents to the emergency department with persistent rectal discomfort.  Patient's fourth visit to the emergency department for similar complaints in the past 1 week.  States she is having consistent discomfort to the "butt crack" and that is burning in nature. Mildly alleviated with wash cloth at prior visit. Denies diarrhea, constipation, melena, hematochezia, BRBPR, or abdominal pain.   HPI  Past Medical History:  Diagnosis Date  . Migraines   . Psychoses (HCC)   . Schizophrenia (HCC)   . Seizures [facility])     Patient Active Problem List   Diagnosis Date Noted  . Acute episode of schizophrenia with history of multiple episodes (HCC) 06/30/2017  . Schizophrenia, paranoid type (HCC) 06/08/2017  . 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 Psychoses (HCC)   . Paranoid schizophrenia (HCC) 09/29/2015  . Cannabis use disorder, moderate, in early remission (HCC) 09/26/2015  . History of posttraumatic stress disorder (PTSD) 09/26/2015    Past Surgical History:  Procedure Laterality Date  . abd surgery s/p traumatic event    . facial reconstructive surgery    . NO PAST SURGERIES  patient stated he had facial reconstruction surgery as a child   . tumor removed from head           Home Medications    Prior to Admission medications   Medication Sig Start Date End Date Taking? Authorizing Provider  aspirin EC 325 MG tablet Take 1 tablet (325 mg total) by mouth daily. Patient not taking: Reported on 08/24/2018 08/13/18   Ward, Chase Picket, PA-C  ondansetron (ZOFRAN ODT) 4 MG disintegrating tablet Take 1 tablet (4 mg total) by mouth every 8  (eight) hours as needed for nausea or vomiting. Patient not taking: Reported on 08/24/2018 07/22/18   Roxy Horseman, PA-C    Family History Family History  Problem Relation Age of Onset  . Hypertension Other   . Mental illness Neg Hx     Social History Social History   Tobacco Use  . Smoking status: Former Smoker    Types: Cigarettes  . Smokeless tobacco: Never Used  Substance Use Topics  . Alcohol use: No  . Drug use: No     Allergies   Patient has no known allergies.   Review of Systems Review of Systems  Constitutional: Negative for chills and fever.  Gastrointestinal: Positive for rectal pain. Negative for abdominal pain, anal bleeding, blood in stool, constipation, diarrhea, nausea and vomiting.  Genitourinary: Negative for dysuria.  All other systems reviewed and are negative.    Physical Exam Updated Vital Signs BP 117/77 (BP Location: Left Arm)   Pulse 98   Temp 98.1 F (36.7 C) (Oral)   Resp 18   Ht 5\' 10"  (1.778 m)   Wt 59 kg   SpO2 100%   BMI 18.65 kg/m   Physical Exam Vitals signs and nursing note reviewed. Exam conducted with a chaperone present.  Constitutional:      General: She is not in acute distress.    Appearance: She is well-developed.  HENT:     Head: Normocephalic and atraumatic.  Eyes:     General:  Right eye: No discharge.        Left eye: No discharge.     Conjunctiva/sclera: Conjunctivae normal.  Cardiovascular:     Rate and Rhythm: Normal rate and regular rhythm.     Heart sounds: No murmur.  Pulmonary:     Effort: Pulmonary effort is normal. No respiratory distress.     Breath sounds: Normal breath sounds.  Abdominal:     General: There is no distension.     Palpations: Abdomen is soft.     Tenderness: There is no abdominal tenderness.  Genitourinary:    Comments: Significant amount of soft & crusted brown stool surrounding the rectum and the gluteal fold.  No appreciable skin breakdown.  No obvious  hemorrhoids or fissures. NO erythema/warmth or palpable fluctuance.  Neurological:     Mental Status: She is alert.     Comments: Clear speech.   Psychiatric:        Behavior: Behavior normal.        Thought Content: Thought content normal.      ED Treatments / Results  Labs (all labs ordered are listed, but only abnormal results are displayed) Labs Reviewed - No data to display  EKG None  Radiology No results found.  Procedures Procedures (including critical care time)  Medications Ordered in ED Medications - No data to display   Initial Impression / Assessment and Plan / ED Course  I have reviewed the triage vital signs and the nursing notes.  Pertinent labs & imaging results that were available during my care of the patient were reviewed by me and considered in my medical decision making (see chart for details).   Patient returns to the emergency department with persistent complaint of rectal discomfort/burning sensation.  Nontoxic-appearing, no apparent distress, vitals WNL on my assessment.  On exam patient has significant amount of fecal content surrounding the rectum.  No appreciable hemorrhoids, fissures, or bleeding noted. Stool is soft and brown.  No melena.  No appreciable skin break down or signs of cellulitis. Seen in the emergency department 3 times in the past 1 week for similar, suspected to be secondary to poor hygiene, this seems to be the case on my assessment as well.  Patient provided with warm washcloth and soap.  Discussed importance of hygiene and primary care/IRC follow-up. I discussed treatment plan, need for follow-up, and return precautions with the patient. Provided opportunity for questions, patient confirmed understanding and is in agreement with plan.    Final Clinical Impressions(s) / ED Diagnoses   Final diagnoses:  Rectal irritation    ED Discharge Orders    None       Desmond Lope 09/05/18 2139    Maia Plan,  MD 09/05/18 2333

## 2018-09-05 NOTE — ED Notes (Signed)
Pt in room smoking a cigarette. Pt explained that tobacco is prohibited on the campus by Clinical research associate and security. Security at bedside, cigarette's and lighter taken from pt.

## 2018-09-05 NOTE — ED Notes (Signed)
Pt given scrubs and socks, along with toiletries to cleanse with.

## 2018-09-05 NOTE — ED Triage Notes (Signed)
States pain in rectal area for a couple of days, pt is talking incoherently unable to understand most of what patient is saying and repeats the same thing over.

## 2018-09-24 ENCOUNTER — Encounter (HOSPITAL_COMMUNITY): Payer: Self-pay

## 2018-09-24 ENCOUNTER — Emergency Department (HOSPITAL_COMMUNITY)
Admission: EM | Admit: 2018-09-24 | Discharge: 2018-09-24 | Disposition: A | Discharge status: Home / Self Care | Payer: Self-pay | Attending: Emergency Medicine | Admitting: Emergency Medicine

## 2018-09-24 DIAGNOSIS — M79602 Pain in left arm: Secondary | ICD-10-CM | POA: Insufficient documentation

## 2018-09-24 DIAGNOSIS — Z87891 Personal history of nicotine dependence: Secondary | ICD-10-CM | POA: Insufficient documentation

## 2018-09-24 DIAGNOSIS — R52 Pain, unspecified: Secondary | ICD-10-CM

## 2018-09-24 DIAGNOSIS — M79604 Pain in right leg: Secondary | ICD-10-CM | POA: Insufficient documentation

## 2018-09-24 DIAGNOSIS — R1011 Right upper quadrant pain: Secondary | ICD-10-CM | POA: Insufficient documentation

## 2018-09-24 DIAGNOSIS — M79605 Pain in left leg: Secondary | ICD-10-CM | POA: Insufficient documentation

## 2018-09-24 DIAGNOSIS — R109 Unspecified abdominal pain: Secondary | ICD-10-CM

## 2018-09-24 DIAGNOSIS — M79601 Pain in right arm: Secondary | ICD-10-CM | POA: Insufficient documentation

## 2018-09-24 MED ORDER — ACETAMINOPHEN 325 MG PO TABS
650.0000 mg | ORAL_TABLET | Freq: Once | ORAL | Status: AC
  Administered 2018-09-24: 650 mg via ORAL
  Filled 2018-09-24: qty 2

## 2018-09-24 NOTE — ED Notes (Signed)
Bed: [facility]4 Expected date:  Expected time:  Means of arrival:  Comments: 

## 2018-09-24 NOTE — Discharge Instructions (Signed)
You can take 1 to 2 tablets of Tylenol every 6 hours as needed for pain.  Do not exceed 4000 mg of Tylenol daily.  Drink plenty fluids and get plenty of rest.  Follow-up with Lavinia Sharps to establish primary care services.  Return to the emergency department if any concerning signs or symptoms develop.

## 2018-09-24 NOTE — ED Provider Notes (Signed)
[facility]-EMERGENCY DEPT Provider Note   CSN: [id number] Arrival date & time: 09/24/18  1815     History   Chief Complaint Chief Complaint  Patient presents with  . Abdominal Pain    HPI Collin White is a 23 y.o. adult with history of migraines, schizophrenia, psychosis, cannabis use disorder, seizures, borderline intellectual functioning presents for evaluation of acute onset, generalized myalgias beginning at around 4:20 PM today.  He reports aching pain to the right side of the abdomen and pain in to his upper and lower extremities.  He denies nausea, vomiting, numbness, weakness, urinary symptoms, diarrhea, constipation, fevers, or chills.  He has not tried anything for his symptoms.  Pain does not radiate.  No aggravating or alleviating factors noted.  He is well-known to this ED with 25 visits in the last 6 months for various complaints.  Has presented multiple times recently for evaluation of flank pain with negative work-ups.  The history is provided by the patient.    Past Medical History:  Diagnosis Date  . Migraines   . Psychoses (HCC)   . Schizophrenia (HCC)   . Seizures [facility])     Patient Active Problem List   Diagnosis Date Noted  . Acute episode of schizophrenia with history of multiple episodes (HCC) 06/30/2017  . Schizophrenia, paranoid type (HCC) 06/08/2017  . 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 Psychoses (HCC)   . Paranoid schizophrenia (HCC) 09/29/2015  . Cannabis use disorder, moderate, in early remission (HCC) 09/26/2015  . History of posttraumatic stress disorder (PTSD) 09/26/2015    Past Surgical History:  Procedure Laterality Date  . abd surgery s/p traumatic event    . facial reconstructive surgery    . NO PAST SURGERIES  patient stated he had facial reconstruction surgery as a child   . tumor removed from head           Home Medications    Prior to Admission medications   Medication Sig Start Date  End Date Taking? Authorizing Provider  aspirin EC 325 MG tablet Take 1 tablet (325 mg total) by mouth daily. Patient not taking: Reported on 08/24/2018 08/13/18   Ward, Chase Picket, PA-C  ondansetron (ZOFRAN ODT) 4 MG disintegrating tablet Take 1 tablet (4 mg total) by mouth every 8 (eight) hours as needed for nausea or vomiting. Patient not taking: Reported on 08/24/2018 07/22/18   Roxy Horseman, PA-C    Family History Family History  Problem Relation Age of Onset  . Hypertension Other   . Mental illness Neg Hx     Social History Social History   Tobacco Use  . Smoking status: Former Smoker    Types: Cigarettes  . Smokeless tobacco: Never Used  Substance Use Topics  . Alcohol use: No  . Drug use: No     Allergies   Patient has no known allergies.   Review of Systems Review of Systems  Constitutional: Negative for chills and fever.  Respiratory: Negative for shortness of breath.   Cardiovascular: Negative for chest pain.  Gastrointestinal: Positive for abdominal pain. Negative for diarrhea, nausea and vomiting.  Musculoskeletal: Positive for myalgias.  All other systems reviewed and are negative.    Physical Exam Updated Vital Signs BP 119/78 (BP Location: Left Arm)   Pulse (!) 102   Temp 97.7 F (36.5 C) (Oral)   Resp 13   Ht 6' (1.829 m)   Wt 63.5 kg   SpO2 99%   BMI 18.99 kg/m  Physical Exam Vitals signs and nursing note reviewed.  Constitutional:      General: She is not in acute distress.    Appearance: She is well-developed.     Comments: Resting in chair. Mumbles frequently but generally answers questions. Clothing disheveled and dirty.   HENT:     Head: Normocephalic and atraumatic.  Eyes:     General:        Right eye: No discharge.        Left eye: No discharge.     Extraocular Movements: Extraocular movements intact.     Conjunctiva/sclera: Conjunctivae normal.     Pupils: Pupils are equal, round, and reactive to light.  Neck:      Musculoskeletal: Normal range of motion and neck supple.     Vascular: No JVD.     Trachea: No tracheal deviation.  Cardiovascular:     Rate and Rhythm: Normal rate and regular rhythm.     Pulses: Normal pulses.     Heart sounds: Normal heart sounds.  Pulmonary:     Effort: Pulmonary effort is normal.     Breath sounds: Normal breath sounds.  Chest:     Chest wall: No tenderness.  Abdominal:     General: Abdomen is flat. Bowel sounds are normal. There is no distension.     Palpations: Abdomen is soft.     Tenderness: There is no abdominal tenderness. There is no right CVA tenderness, left CVA tenderness, guarding or rebound.     Comments: No tenderness on palpation of the abdomen.   Musculoskeletal: Normal range of motion.        General: No swelling or tenderness.     Comments: No midline spine TTP, no paraspinal muscle tenderness, no deformity, crepitus, or step-off noted.  5/5 strength of BUE and BLE major muscle groups.  No deformity, crepitus, erythema, or swelling noted on palpation of the extremities.  Skin:    General: Skin is warm and dry.     Findings: No erythema.  Neurological:     General: No focal deficit present.     Mental Status: She is alert and oriented to person, place, and time. Mental status is at baseline.     Cranial Nerves: No cranial nerve deficit.     Sensory: No sensory deficit.     Motor: No weakness.     Coordination: Coordination normal.  Psychiatric:        Behavior: Behavior normal.      ED Treatments / Results  Labs (all labs ordered are listed, but only abnormal results are displayed) Labs Reviewed - No data to display  EKG None  Radiology No results found.  Procedures Procedures (including critical care time)  Medications Ordered in ED Medications  acetaminophen (TYLENOL) tablet 650 mg (650 mg Oral Given 09/24/18 2010)     Initial Impression / Assessment and Plan / ED Course  I have reviewed the triage vital signs and the  nursing notes.  Pertinent labs & imaging results that were available during my care of the patient were reviewed by me and considered in my medical decision making (see chart for details).     Patient presenting for evaluation of right-sided abdominal pains and generalized body aches beginning a few hours prior to arrival.  He is afebrile, initially triaged as mildly tachycardic however was not tachycardic upon my assessment.  He is nontoxic in appearance.  Abdomen is soft and actually nontender on examination.  No peritoneal signs.  He has  no other associated symptoms including nausea, vomiting, diarrhea, constipation, or urinary symptoms.  He is neurovascularly intact, no signs of secondary skin infection or septic joint.  He was given Tylenol and food in the ED with improvement.  Doubt acute surgical or serious infectious abdominal pathology.  Stable for discharge with follow-up with PCP.  I will refer him to [place] [facility] [facility].  Discussed strict ED return precautions. Pt verbalized understanding of and agreement with plan and is safe for discharge home at this time.   Final Clinical Impressions(s) / ED Diagnoses   Final diagnoses:  Right sided abdominal pain  Generalized body aches    ED Discharge Orders    None       Bennye Alm 09/24/18 2030    Benjiman Core, MD 09/24/18 2358

## 2018-09-24 NOTE — ED Triage Notes (Signed)
Patient c/o right upper to lower abdominal pain, and left and right arm pain.   Ambulatory in triage.   10/10 achy abdominal pain.

## 2018-09-24 NOTE — ED Notes (Addendum)
Pt is refusing to speak with [facility], NP

## 2018-09-24 NOTE — ED Notes (Signed)
Pt given water 

## 2018-09-24 NOTE — ED Notes (Addendum)
Pt yelled at staff member while reviewing discharge paperwork.  discharge instructions and follow up care were discussed. Pt escorted out by security after refusing to leave and stating "[facility] Pt informed it is not illegal.

## 2018-09-26 ENCOUNTER — Encounter (HOSPITAL_COMMUNITY): Payer: Self-pay

## 2018-09-26 ENCOUNTER — Encounter (HOSPITAL_COMMUNITY): Payer: Self-pay | Admitting: Emergency Medicine

## 2018-09-26 ENCOUNTER — Other Ambulatory Visit: Payer: Self-pay

## 2018-09-26 ENCOUNTER — Emergency Department (HOSPITAL_COMMUNITY)
Admission: EM | Admit: 2018-09-26 | Discharge: 2018-09-26 | Disposition: A | Discharge status: Home / Self Care | Payer: Self-pay | Attending: Emergency Medicine | Admitting: Emergency Medicine

## 2018-09-26 DIAGNOSIS — F1221 Cannabis dependence, in remission: Secondary | ICD-10-CM | POA: Insufficient documentation

## 2018-09-26 DIAGNOSIS — Z7982 Long term (current) use of aspirin: Secondary | ICD-10-CM | POA: Insufficient documentation

## 2018-09-26 DIAGNOSIS — K1379 Other lesions of oral mucosa: Secondary | ICD-10-CM

## 2018-09-26 DIAGNOSIS — Z87891 Personal history of nicotine dependence: Secondary | ICD-10-CM | POA: Insufficient documentation

## 2018-09-26 DIAGNOSIS — Z711 Person with feared health complaint in whom no diagnosis is made: Secondary | ICD-10-CM | POA: Insufficient documentation

## 2018-09-26 DIAGNOSIS — R569 Unspecified convulsions: Secondary | ICD-10-CM | POA: Insufficient documentation

## 2018-09-26 DIAGNOSIS — K0889 Other specified disorders of teeth and supporting structures: Secondary | ICD-10-CM | POA: Insufficient documentation

## 2018-09-26 DIAGNOSIS — F209 Schizophrenia, unspecified: Secondary | ICD-10-CM | POA: Insufficient documentation

## 2018-09-26 DIAGNOSIS — Z5900 Homelessness unspecified: Secondary | ICD-10-CM

## 2018-09-26 DIAGNOSIS — Z79899 Other long term (current) drug therapy: Secondary | ICD-10-CM | POA: Insufficient documentation

## 2018-09-26 NOTE — Discharge Instructions (Signed)
This is your second ER visit for today.  You can alternate between Tylenol Motrin for any pain or fever.  Follow-up with the [facility] and wellness center in 1 week for reassessment and to establish medical care.  Return to the ER only for emergent changes or worsening in symptoms.

## 2018-09-26 NOTE — Discharge Instructions (Signed)
There does not appear to be anything in your mouth causing ear pain.  You can use over-the-counter Orajel, Tylenol, and Motrin for pain.  You can use salt water swishes to help with pain.  Follow-up with the [facility] wellness center in the next week to establish care and for reassessment of your symptoms.  Return to the ER for emergent changes or worsening symptoms.

## 2018-09-26 NOTE — ED Notes (Signed)
Pt left before receiving discharge paperwork/instructions.

## 2018-09-26 NOTE — ED Triage Notes (Signed)
Pt is here because he "doesn't feel good". Pt has already been seen and d/c today. Poor hygiene noted.

## 2018-09-26 NOTE — ED Provider Notes (Signed)
[facility]-EMERGENCY DEPT Provider Note   CSN: [id number] Arrival date & time: 09/26/18  1757     History   Chief Complaint Chief Complaint  Patient presents with  . Homeless    HPI Zayvian Gewirtz is a 23 y.o. adult with a PMHx of migraines, schizophrenia, seizures, and borderline intellectual functioning, who presents to the ED for the second visit today, with complaints of "I don't feel good".  LEVEL 5 CAVEAT DUE TO PT UNCOOPERATIVENESS.  Patient states that he does not feel good, but will not further elaborate.  He states "I am hot in the face" and "my mouth is sore".  Pt was seen by this provider earlier today for c/o mouth pain, exam was benign, he was told to follow-up with the [facility] and wellness center to establish medical care, and use supportive measures at home for his mouth pain.  He will not further elaborate on what brought him back into the ER today, other than saying that he does not feel good.  He gets very agitated when he is asked any questions, and then stands up screaming expletives at this provider.  The remainder of his history and review of systems are therefore limited due to his uncooperativeness. Of note, he has been seen 27 times in the last 6 months, for a variety of complaints.    The history is provided by the patient and medical records. No language interpreter was used.    Past Medical History:  Diagnosis Date  . Migraines   . Psychoses (HCC)   . Schizophrenia (HCC)   . Seizures [facility])     Patient Active Problem List   Diagnosis Date Noted  . Acute episode of schizophrenia with history of multiple episodes (HCC) 06/30/2017  . Schizophrenia, paranoid type (HCC) 06/08/2017  . 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 Psychoses (HCC)   . Paranoid schizophrenia (HCC) 09/29/2015  . Cannabis use disorder, moderate, in early remission (HCC) 09/26/2015  . History of posttraumatic stress disorder (PTSD) 09/26/2015     Past Surgical History:  Procedure Laterality Date  . abd surgery s/p traumatic event    . facial reconstructive surgery    . NO PAST SURGERIES  patient stated he had facial reconstruction surgery as a child   . tumor removed from head           Home Medications    Prior to Admission medications   Medication Sig Start Date End Date Taking? Authorizing Provider  aspirin EC 325 MG tablet Take 1 tablet (325 mg total) by mouth daily. Patient not taking: Reported on 08/24/2018 08/13/18   Ward, Chase Picket, PA-C  ondansetron (ZOFRAN ODT) 4 MG disintegrating tablet Take 1 tablet (4 mg total) by mouth every 8 (eight) hours as needed for nausea or vomiting. Patient not taking: Reported on 08/24/2018 07/22/18   Roxy Horseman, PA-C    Family History Family History  Problem Relation Age of Onset  . Hypertension Other   . Mental illness Neg Hx     Social History Social History   Tobacco Use  . Smoking status: Former Smoker    Types: Cigarettes  . Smokeless tobacco: Never Used  Substance Use Topics  . Alcohol use: No  . Drug use: No     Allergies   Patient has no known allergies.   Review of Systems Review of Systems  Allergic/Immunologic: Negative for immunocompromised state.  LEVEL 5 CAVEAT DUE TO PT UNCOOPERATIVENESS  Physical Exam Updated Vital Signs  BP (!) 134/50 (BP Location: Right Arm)   Pulse (!) 104   Temp 98.5 F (36.9 C) (Oral)   Resp 16   Ht 6' (1.829 m)   Wt 63.5 kg   SpO2 98%   BMI 18.99 kg/m   Physical Exam Vitals signs and nursing note reviewed.  Constitutional:      General: She is not in acute distress.    Appearance: Normal appearance. She is well-developed. She is not toxic-appearing.     Comments: Afebrile, nontoxic, NAD, poor hygiene  HENT:     Head: Normocephalic and atraumatic.  Eyes:     General:        Right eye: No discharge.        Left eye: No discharge.     Conjunctiva/sclera: Conjunctivae normal.  Neck:      Musculoskeletal: Normal range of motion and neck supple.  Cardiovascular:     Rate and Rhythm: Normal rate and regular rhythm.     Pulses: Normal pulses.     Heart sounds: Normal heart sounds, S1 normal and S2 normal. No murmur. No friction rub. No gallop.   Pulmonary:     Effort: Pulmonary effort is normal. No respiratory distress.     Breath sounds: Normal breath sounds. No decreased breath sounds, wheezing, rhonchi or rales.  Abdominal:     General: Bowel sounds are normal. There is no distension.     Palpations: Abdomen is soft. Abdomen is not rigid.     Tenderness: There is no abdominal tenderness. There is no right CVA tenderness, left CVA tenderness, guarding or rebound. Negative signs include Murphy's sign and McBurney's sign.  Musculoskeletal: Normal range of motion.  Skin:    General: Skin is warm and dry.     Findings: No rash.  Neurological:     Mental Status: She is alert and oriented to person, place, and time.     Sensory: Sensation is intact. No sensory deficit.     Motor: Motor function is intact.  Psychiatric:        Mood and Affect: Mood normal. Affect is labile.        Behavior: Behavior is agitated.     Comments: Gets agitated when asked questions, labile affect      ED Treatments / Results  Labs (all labs ordered are listed, but only abnormal results are displayed) Labs Reviewed - No data to display  EKG None  Radiology No results found.  Procedures Procedures (including critical care time)  Medications Ordered in ED Medications - No data to display   Initial Impression / Assessment and Plan / ED Course  I have reviewed the triage vital signs and the nursing notes.  Pertinent labs & imaging results that were available during my care of the patient were reviewed by me and considered in my medical decision making (see chart for details).     23 y.o. adult here for the second time today, he states that he does not feel well but would not  elaborate further.  He was seen earlier by me for mouth pain, exam was unremarkable, he was told to follow-up with [name] to establish medical care.  He comes in again just stating that he does not feel good.  When asked to elaborate further on his symptoms, he gets agitated and starts yelling expletives.  His physical exam is benign other than having poor hygiene.  He does not appear to have an acute emergent pathology going on at this time,  I do not feel he needs any further emergent work-up.  Advised again that he needs to follow-up with the wellness center to establish medical care, and discussed appropriate use of the ER. I explained the diagnosis and have given explicit precautions to return to the ER including for any other new or worsening symptoms. The patient understands and accepts the medical plan as it's been dictated and I have answered their questions. Discharge instructions concerning home care and prescriptions have been given. The patient is STABLE and is discharged to home in good condition.    Final Clinical Impressions(s) / ED Diagnoses   Final diagnoses:  Homelessness  Physically well but worried    ED Discharge Orders    [address]       Serrina Minogue, [place]cedes, [place]-C 09/26/18 1932    Tilden Fossaees, Elizabeth, MD 09/28/18 1012

## 2018-09-26 NOTE — ED Provider Notes (Signed)
[facility]-EMERGENC[facility] DEPT Provider Note   CSN: [id number] Arrival date & time: 09/26/18  1511     History   Chief Complaint Chief Complaint  Patient presents with  . Dental Pain    HPI Collin White is a 23 y.o. adult with a PMHx of migraines, schizophrenia, seizures, and borderline intellectual functioning, well known to the ED with 26 visits in the last 6 months for a variety of complaints, who presents to the ED today with complaints of mild constant mouth pain since yesterday.  He has not tried anything for it, and eating certain foods make it worse.  He states that he ate chicken yesterday and that seemed to bother him.  He is a very vague and poor historian, and therefore this limits his evaluation somewhat.  He denies any other symptoms but then when asked he states that he does have some rhinorrhea.  He denies any gum swelling or drainage, sore throat, drooling, trismus, dental pain, or any other complaints at this time.  The history is provided by the patient and medical records. No language interpreter was used.  Dental Pain  Associated symptoms: no drooling and no facial swelling     Past Medical History:  Diagnosis Date  . Migraines   . Psychoses (HCC)   . Schizophrenia (HCC)   . Seizures [facility](HCC)     Patient Active Problem List   Diagnosis Date Noted  . Acute episode of schizophrenia with history of multiple episodes (HCC) 06/30/2017  . Schizophrenia, paranoid type (HCC) 06/08/2017  . 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 Psychoses (HCC)   . Paranoid schizophrenia (HCC) 09/29/2015  . Cannabis use disorder, moderate, in early remission (HCC) 09/26/2015  . History of posttraumatic stress disorder (PTSD) 09/26/2015    Past Surgical History:  Procedure Laterality Date  . abd surgery s/p traumatic event    . facial reconstructive surgery    . NO PAST SURGERIES  patient stated he had facial reconstruction surgery as a child   .  tumor removed from head           Home Medications    Prior to Admission medications   Medication Sig Start Date End Date Taking? Authorizing Provider  aspirin EC 325 MG tablet Take 1 tablet (325 mg total) by mouth daily. Patient not taking: Reported on 08/24/2018 08/13/18   Ward, Chase PicketJaime Pilcher, PA-C  ondansetron (ZOFRAN ODT) 4 MG disintegrating tablet Take 1 tablet (4 mg total) by mouth every 8 (eight) hours as needed for nausea or vomiting. Patient not taking: Reported on 08/24/2018 07/22/18   Roxy HorsemanBrowning, Robert, PA-C    Family History Family History  Problem Relation Age of Onset  . Hypertension Other   . Mental illness Neg Hx     Social History Social History   Tobacco Use  . Smoking status: Former Smoker    Types: Cigarettes  . Smokeless tobacco: Never Used  Substance Use Topics  . Alcohol use: No  . Drug use: No     Allergies   Patient has no known allergies.   Review of Systems Review of Systems  HENT: Positive for rhinorrhea. Negative for drooling, facial swelling, sore throat and trouble swallowing.        +mouth pain  Allergic/Immunologic: Negative for immunocompromised state.     Physical Exam Updated Vital Signs BP 99/84   Pulse 100   Temp 98 F (36.7 C) (Oral)   Resp 18   SpO2 100%   Physical Exam  Vitals signs and nursing note reviewed.  Constitutional:      General: She is not in acute distress.    Appearance: Normal appearance. She is well-developed. She is not toxic-appearing.     Comments: Afebrile, nontoxic, NAD  HENT:     Head: Normocephalic and atraumatic.     Nose: Nose normal.     Mouth/Throat:     Mouth: Mucous membranes are moist. No oral lesions.     Dentition: No dental caries or dental abscesses.     Palate: No lesions.     Pharynx: Oropharynx is clear. Uvula midline. No pharyngeal swelling, oropharyngeal exudate, posterior oropharyngeal erythema or uvula swelling.     Tonsils: No tonsillar exudate or tonsillar abscesses.      Comments: Nose clear, oropharynx clear and moist without any lesions, no wounds to the gums, buccal mucosa, or tongue.  Throat clear.  No tonsillar swelling or exudates, no PTA.  No trismus or drooling, handling secretions well.  No focal dental tenderness or dental abscesses. Eyes:     General:        Right eye: No discharge.        Left eye: No discharge.     Conjunctiva/sclera: Conjunctivae normal.  Neck:     Musculoskeletal: Normal range of motion and neck supple.  Cardiovascular:     Rate and Rhythm: Normal rate.     Pulses: Normal pulses.  Pulmonary:     Effort: Pulmonary effort is normal. No respiratory distress.  Abdominal:     General: There is no distension.  Musculoskeletal: Normal range of motion.  Skin:    General: Skin is warm and dry.     Findings: No rash.  Neurological:     Mental Status: She is alert and oriented to person, place, and time.     Sensory: Sensation is intact. No sensory deficit.     Motor: Motor function is intact.  Psychiatric:        Mood and Affect: Mood and affect normal.        Behavior: Behavior normal.      ED Treatments / Results  Labs (all labs ordered are listed, but only abnormal results are displayed) Labs Reviewed - No data to display  EKG None  Radiology No results found.  Procedures Procedures (including critical care time)  Medications Ordered in ED Medications - No data to display   Initial Impression / Assessment and Plan / ED Course  I have reviewed the triage vital signs and the nursing notes.  Pertinent labs & imaging results that were available during my care of the patient were reviewed by me and considered in my medical decision making (see chart for details).     23 y.o. adult here with unspecified mouth pain since yesterday.  He is very vague complaints and is a poor historian.  He states that his mouth hurts when he eats specific foods.  On exam, no gross abnormalities in the mouth, no dental pain, no  dental abscesses, throat clear, no trismus or drooling, no evidence of Ludwig's.  Unclear etiology of his symptoms, patient comes to the ED frequently with multiple vague complaints, has had 26 ED visits in the last 6 months.  Doubt need for further intervention or work-up of this, advised that he can use over-the-counter remedies for symptom control, avoid foods that aggravate his symptoms, and follow-up with [facility] and wellness center to establish care and for reassessment. I explained the diagnosis and have given  explicit precautions to return to the ER including for any other new or worsening symptoms. The patient understands and accepts the medical plan as it's been dictated and I have answered their questions. Discharge instructions concerning home care and prescriptions have been given. The patient is STABLE and is discharged to home in good condition.    Final Clinical Impressions(s) / ED Diagnoses   Final diagnoses:  Mouth pain    ED Discharge Orders    [address]       Aquilla Shambley, [place]cedes, [place]PA-C 09/26/18 1559    Samuel JesterMcManus, Kathleen, DO 09/27/18 [phone]

## 2018-09-26 NOTE — ED Triage Notes (Signed)
Pt warm/dry/pink. Pt stated that last night his lower jaw started hurting in the back of his mouth. No signs of infection noted to gums/teeth. Poor dental hygiene noted. Pt denies throat irritation. CAox4.

## 2018-09-30 ENCOUNTER — Other Ambulatory Visit: Payer: Self-pay

## 2018-09-30 ENCOUNTER — Emergency Department (HOSPITAL_COMMUNITY): Payer: Self-pay

## 2018-09-30 ENCOUNTER — Encounter (HOSPITAL_COMMUNITY): Payer: Self-pay

## 2018-09-30 ENCOUNTER — Emergency Department (HOSPITAL_COMMUNITY)
Admission: EM | Admit: 2018-09-30 | Discharge: 2018-09-30 | Disposition: A | Discharge status: Home / Self Care | Payer: Self-pay | Attending: Emergency Medicine | Admitting: Emergency Medicine

## 2018-09-30 DIAGNOSIS — J189 Pneumonia, unspecified organism: Secondary | ICD-10-CM

## 2018-09-30 DIAGNOSIS — J181 Lobar pneumonia, unspecified organism: Secondary | ICD-10-CM | POA: Insufficient documentation

## 2018-09-30 DIAGNOSIS — Z87891 Personal history of nicotine dependence: Secondary | ICD-10-CM | POA: Insufficient documentation

## 2018-09-30 DIAGNOSIS — F209 Schizophrenia, unspecified: Secondary | ICD-10-CM | POA: Insufficient documentation

## 2018-09-30 MED ORDER — DOXYCYCLINE HYCLATE 100 MG PO TABS
100.0000 mg | ORAL_TABLET | Freq: Once | ORAL | Status: AC
  Administered 2018-09-30: 100 mg via ORAL
  Filled 2018-09-30: qty 1

## 2018-09-30 MED ORDER — DOXYCYCLINE HYCLATE 100 MG PO CAPS: 100.0000 mg | ORAL_CAPSULE | Freq: Two times a day (BID) | ORAL | 0 refills | Status: DC

## 2018-09-30 NOTE — Progress Notes (Signed)
CSW provided pt with this woolen socks and a coat, as well as contact information for IRC/meds.  CSW offered pt a bus and/or taxi voucher to the [facility] and pt declined stating he could go to his home by foot, "with no problem".  EDP/RN updated.  Please reconsult if future social work needs arise.  CSW signing off, as social work intervention is no longer needed.  Dorothe Pea. Tionna Gigante, LCSW, LCAS, CSI Clinical Social Worker Ph: [phone]

## 2018-09-30 NOTE — ED Triage Notes (Signed)
Pt states they are having "uninligual pain"?????? Attempted to clarify with patient, asking if they mean groin pain. Pt is talking about "the hole where the baby comes from". When attempting to clarify, pt becomes frustrated and started yelling at this RN.

## 2018-09-30 NOTE — Discharge Instructions (Addendum)
You have a viral infection today.  You also have evidence of early pneumonia in your lungs.  We will treat you with doxycycline.  You will need a recheck of your chest x-ray in 4 weeks.  You may go to [facility] urgent care or establish care at the [facility].   Doxycycline is an antibiotic that fights infection in the lung.  You will take twice a day for 5 days. Please take all of the antibiotics.  Do not stop them early even if you are feeling better.  This medication can make your skin sensitive to the sun, so please ensure that you wear sunscreen, hats, or other coverage over your skin while taking this. This medicine CANNOT be taken by women while pregnant, breastfeeding, or trying to become pregnant.  Please speak with a healthcare provider if any of these situations apply to you.  Return the emergency department if you develop any difficulty breathing, chest pain or shortness of breath, or fevers.

## 2018-09-30 NOTE — ED Notes (Signed)
Pt refused dc vitals. Social worker gave him some clothes which he did not put on and he was offered a taxi that he declined. Ambulatory.

## 2018-10-01 NOTE — ED Provider Notes (Signed)
[facility]-EMERGENCY DEPT Provider Note   CSN: [id number] Arrival date & time: 09/30/18  1804     History   Chief Complaint Chief Complaint  Patient presents with  . Groin Pain    HPI Even Polio is a 23 y.o. adult.  HPI  Patient is a 23 year old adult with history of schizophrenia, migraines, seizures presenting for pain.  They describes the pain is in multiple locations in triage, but ultimately is describing odynophagia while attempting to say "inguinal" or "sublingual".  Patient becomes upset when attempting to clarify.  They reports a nonproductive cough and change in their voice where it feels "hoarse".  Patient does not follow a linear history, but specifically denies any chest pain, nausea, vomiting, abdominal pain, or dysuria, urgency, frequency, penile pain, inguinal pain, or testicular pain.  Unable to identify if patient has taken anything for symptoms, or has had recent sick contacts.  Past Medical History:  Diagnosis Date  . Migraines   . Psychoses (HCC)   . Schizophrenia (HCC)   . Seizures [facility])     Patient Active Problem List   Diagnosis Date Noted  . Acute episode of schizophrenia with history of multiple episodes (HCC) 06/30/2017  . Schizophrenia, paranoid type (HCC) 06/08/2017  . 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 Psychoses (HCC)   . Paranoid schizophrenia (HCC) 09/29/2015  . Cannabis use disorder, moderate, in early remission (HCC) 09/26/2015  . History of posttraumatic stress disorder (PTSD) 09/26/2015    Past Surgical History:  Procedure Laterality Date  . abd surgery s/p traumatic event    . facial reconstructive surgery    . NO PAST SURGERIES  patient stated he had facial reconstruction surgery as a child   . tumor removed from head           Home Medications    Prior to Admission medications   Medication Sig Start Date End Date Taking? Authorizing Provider  aspirin EC 325 MG tablet Take 1 tablet  (325 mg total) by mouth daily. Patient not taking: Reported on 08/24/2018 08/13/18   Ward, Chase Picket, PA-C  doxycycline (VIBRAMYCIN) 100 MG capsule Take 1 capsule (100 mg total) by mouth 2 (two) times daily. 09/30/18   Aviva Kluver B, PA-C  ondansetron (ZOFRAN ODT) 4 MG disintegrating tablet Take 1 tablet (4 mg total) by mouth every 8 (eight) hours as needed for nausea or vomiting. Patient not taking: Reported on 08/24/2018 07/22/18   Roxy Horseman, PA-C    Family History Family History  Problem Relation Age of Onset  . Hypertension Other   . Mental illness Neg Hx     Social History Social History   Tobacco Use  . Smoking status: Former Smoker    Types: Cigarettes  . Smokeless tobacco: Never Used  Substance Use Topics  . Alcohol use: No  . Drug use: No     Allergies   Patient has no known allergies.   Review of Systems Review of Systems  Constitutional: Negative for chills and fever.  HENT: Positive for rhinorrhea, sore throat and voice change. Negative for congestion.   Respiratory: Positive for cough.      Physical Exam Updated Vital Signs BP 116/88   Pulse 88   Temp 98.7 F (37.1 C) (Oral)   Resp 16   SpO2 100%   Physical Exam Vitals signs and nursing note reviewed.  Constitutional:      General: She is not in acute distress.    Appearance: She is well-developed. She  is not diaphoretic.     Comments: Sitting comfortably in bed.  HENT:     Head: Normocephalic and atraumatic.     Mouth/Throat:     Comments: Normal phonation. No muffled voice sounds. Patient swallows secretions without difficulty. Dentition normal. Torus present on floor of mouth. No lesions of tongue or buccal mucosa. Uvula midline. No asymmetric swelling of the posterior pharynx. No erythema of posterior pharynx. No tonsillar exuduate. No lingual swelling. No induration inferior to tongue. No submandibular tenderness, swelling, or induration.  Tissues of the neck supple. No cervical  lymphadenopathy. Right TM without erythema or effusion; left TM without erythema or effusion. Eyes:     General:        Right eye: No discharge.        Left eye: No discharge.     Conjunctiva/sclera: Conjunctivae normal.     Comments: EOMs normal to gross examination.  Neck:     Musculoskeletal: Normal range of motion.  Cardiovascular:     Rate and Rhythm: Normal rate and regular rhythm.     Heart sounds: Normal heart sounds.  Pulmonary:     Breath sounds: Rhonchi present. No wheezing or rales.  Abdominal:     General: There is no distension.  Musculoskeletal: Normal range of motion.  Skin:    General: Skin is warm and dry.  Neurological:     Mental Status: She is alert.     Comments: Cranial nerves intact to gross observation. Patient moves extremities without difficulty.  Psychiatric:        Behavior: Behavior normal.        Thought Content: Thought content normal.        Judgment: Judgment normal.      ED Treatments / Results  Labs (all labs ordered are listed, but only abnormal results are displayed) Labs Reviewed - No data to display  EKG None  Radiology Dg Chest 2 View  Result Date: 09/30/2018 CLINICAL DATA:  Cough and congestion EXAM: CHEST - 2 VIEW COMPARISON:  08/24/2018 FINDINGS: Cardiac shadow is within normal limits. The lungs are well aerated bilaterally. Mild patchy right upper lobe infiltrate is seen consistent with acute pneumonia. This is new from the prior exam. No bony abnormality is seen. IMPRESSION: Mild right upper lobe pneumonia. Electronically Signed   By: Alcide CleverMark  Lukens M.D.   On: 09/30/2018 22:07    Procedures Procedures (including critical care time)  Medications Ordered in ED Medications  doxycycline (VIBRA-TABS) tablet 100 mg (100 mg Oral Given 09/30/18 2234)     Initial Impression / Assessment and Plan / ED Course  I have reviewed the triage vital signs and the nursing notes.  Pertinent labs & imaging results that were available  during my care of the patient were reviewed by me and considered in my medical decision making (see chart for details).     Patient is well-appearing and in no acute distress.  Patient with normal vital signs without fever, tachycardia, tachypnea or hypoxia.  Differential diagnosis includes viral upper respiratory infection, pneumonia, influenza.  Patient appears clinically well.  While initially was understood the patient was having pain in the inguinal region, clarification with the patient really feels that they were misusing the word and are actually experiencing hoarse voice and cough.  Chest x-ray obtained which reveals early right upper lobe infiltrate.  Recommended follow-up in 4 to 6 weeks for assessment of resolution.  No signs of cavitating lesions or concerning vital signs for tuberculosis, although patient is  homeless.  Will treat with doxycycline.  No indication of PTA, RPA, or deep space infection of the head or neck.   Social work consulted given patient's homeless state to determine if they can obtain antibiotics.  They state that they have money.  Patient given taxi voucher to leave her house tonight for 11:00 intake.  Appreciate involvement of social work.  Return precautions given for any chest pain, shortness of breath, or fevers.  Patient is in understanding and agrees with the plan of care.  Final Clinical Impressions(s) / ED Diagnoses   Final diagnoses:  Community acquired pneumonia of right upper lobe of lung [facility])    ED Discharge Orders         Ordered    doxycycline (VIBRAMYCIN) 100 MG capsule  2 times daily     09/30/18 2319           Delia Chimes 10/01/18 0048    Arby Barrette, MD 10/01/18 1254

## 2018-10-02 ENCOUNTER — Emergency Department (HOSPITAL_COMMUNITY)
Admission: EM | Admit: 2018-10-02 | Discharge: 2018-10-02 | Disposition: A | Discharge status: Home / Self Care | Payer: Self-pay | Attending: Emergency Medicine | Admitting: Emergency Medicine

## 2018-10-02 ENCOUNTER — Other Ambulatory Visit: Payer: Self-pay

## 2018-10-02 ENCOUNTER — Encounter (HOSPITAL_COMMUNITY): Payer: Self-pay

## 2018-10-02 DIAGNOSIS — R05 Cough: Secondary | ICD-10-CM

## 2018-10-02 DIAGNOSIS — J189 Pneumonia, unspecified organism: Secondary | ICD-10-CM | POA: Insufficient documentation

## 2018-10-02 DIAGNOSIS — Z87891 Personal history of nicotine dependence: Secondary | ICD-10-CM | POA: Insufficient documentation

## 2018-10-02 DIAGNOSIS — R059 Cough, unspecified: Secondary | ICD-10-CM

## 2018-10-02 MED ORDER — ONDANSETRON 4 MG PO TBDP
4.0000 mg | ORAL_TABLET | Freq: Once | ORAL | Status: AC
  Administered 2018-10-02: 4 mg via ORAL
  Filled 2018-10-02: qty 1

## 2018-10-02 MED ORDER — BENZONATATE 100 MG PO CAPS: 100.0000 mg | ORAL_CAPSULE | Freq: Three times a day (TID) | ORAL | 0 refills | Status: DC

## 2018-10-02 MED ORDER — BENZONATATE 100 MG PO CAPS
200.0000 mg | ORAL_CAPSULE | Freq: Once | ORAL | Status: AC
  Administered 2018-10-02: 200 mg via ORAL
  Filled 2018-10-02: qty 2

## 2018-10-02 NOTE — ED Provider Notes (Signed)
[facility]PT Provider Note   CSN: [id number] Arrival date & time: 10/02/18  1704     History   Chief Complaint Chief Complaint  Patient presents with  . Emesis    HPI Collin White is a 23 y.o. adult.  HPI Patient is well-known to the emergency room.  Patient has a history of schizophrenia and is homeless.  Patient was in the emergency room on the 13th for evaluation of cough and congestion.  Patient had a chest x-ray that suggested pneumonia.  He was discharged with a prescription for antibiotics.  Patient states today he had a significant coughing spell.  It was productive of mucus and liquid.  Unclear if there is also a component of emesis.  No known fevers.  No diarrhea.  No shortness of breath. Past Medical History:  Diagnosis Date  . Migraines   . Psychoses (HCC)   . Schizophrenia (HCC)   . Seizures [facility])     Patient Active Problem List   Diagnosis Date Noted  . Acute episode of schizophrenia with history of multiple episodes (HCC) 06/30/2017  . Schizophrenia, paranoid type (HCC) 06/08/2017  . 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 Psychoses (HCC)   . Paranoid schizophrenia (HCC) 09/29/2015  . Cannabis use disorder, moderate, in early remission (HCC) 09/26/2015  . History of posttraumatic stress disorder (PTSD) 09/26/2015    Past Surgical History:  Procedure Laterality Date  . abd surgery s/p traumatic event    . facial reconstructive surgery    . NO PAST SURGERIES  patient stated he had facial reconstruction surgery as a child   . tumor removed from head           Home Medications    Prior to Admission medications   Medication Sig Start Date End Date Taking? Authorizing Provider  aspirin EC 325 MG tablet Take 1 tablet (325 mg total) by mouth daily. Patient not taking: Reported on 08/24/2018 08/13/18   Ward, Chase Picket, PA-C  doxycycline (VIBRAMYCIN) 100 MG capsule Take 1 capsule (100 mg total) by mouth 2  (two) times daily. Patient not taking: Reported on 10/02/2018 09/30/18   Aviva Kluver B, PA-C  ondansetron (ZOFRAN ODT) 4 MG disintegrating tablet Take 1 tablet (4 mg total) by mouth every 8 (eight) hours as needed for nausea or vomiting. Patient not taking: Reported on 08/24/2018 07/22/18   Roxy Horseman, PA-C    Family History Family History  Problem Relation Age of Onset  . Hypertension Other   . Mental illness Neg Hx     Social History Social History   Tobacco Use  . Smoking status: Former Smoker    Types: Cigarettes  . Smokeless tobacco: Never Used  Substance Use Topics  . Alcohol use: No  . Drug use: No     Allergies   Patient has no known allergies.   Review of Systems Review of Systems  All other systems reviewed and are negative.    Physical Exam Updated Vital Signs BP 124/88 (BP Location: Left Arm)   Pulse 94   Temp (!) 97.5 F (36.4 C) (Oral)   Resp 16   Ht 1.829 m (6')   Wt 63.5 kg   SpO2 99%   BMI 18.99 kg/m   Physical Exam Vitals signs and nursing note reviewed.  Constitutional:      General: She is not in acute distress.    Appearance: She is well-developed.  HENT:     Head: Normocephalic and atraumatic.  Right Ear: External ear normal.     Left Ear: External ear normal.  Eyes:     General: No scleral icterus.       Right eye: No discharge.        Left eye: No discharge.     Conjunctiva/sclera: Conjunctivae normal.  Neck:     Musculoskeletal: Neck supple.     Trachea: No tracheal deviation.  Cardiovascular:     Rate and Rhythm: Normal rate and regular rhythm.  Pulmonary:     Effort: Pulmonary effort is normal. No respiratory distress.     Breath sounds: Normal breath sounds. No stridor. No wheezing or rales.  Abdominal:     General: Bowel sounds are normal. There is no distension.     Palpations: Abdomen is soft.     Tenderness: There is no abdominal tenderness. There is no guarding or rebound.  Musculoskeletal:         General: No tenderness.  Skin:    General: Skin is warm and dry.     Findings: No rash.  Neurological:     Mental Status: She is alert.     Cranial Nerves: No cranial nerve deficit (no facial droop, extraocular movements intact, no slurred speech).     Sensory: No sensory deficit.     Motor: No abnormal muscle tone or seizure activity.     Coordination: Coordination normal.  Psychiatric:        Mood and Affect: Affect is flat.        Speech: Speech is tangential.        Behavior: Behavior is slowed and withdrawn. Behavior is not aggressive.        Thought Content: Thought content does not include homicidal or suicidal ideation.     Comments: Very soft-spoken      ED Treatments / Results   Radiology Dg Chest 2 View  Result Date: 09/30/2018 CLINICAL DATA:  Cough and congestion EXAM: CHEST - 2 VIEW COMPARISON:  08/24/2018 FINDINGS: Cardiac shadow is within normal limits. The lungs are well aerated bilaterally. Mild patchy right upper lobe infiltrate is seen consistent with acute pneumonia. This is new from the prior exam. No bony abnormality is seen. IMPRESSION: Mild right upper lobe pneumonia. Electronically Signed   By: Alcide Clever M.D.   On: 09/30/2018 22:07    Procedures Procedures (including critical care time)  Medications Ordered in ED Medications  benzonatate (TESSALON) capsule 200 mg (has no administration in time range)  ondansetron (ZOFRAN-ODT) disintegrating tablet 4 mg (has no administration in time range)     Initial Impression / Assessment and Plan / ED Course  I have reviewed the triage vital signs and the nursing notes.  Pertinent labs & imaging results that were available during my care of the patient were reviewed by me and considered in my medical decision making (see chart for details).   Patient presents with recent diagnosis of pneumonia.  Patient is having difficulties with a cough possibly posttussive emesis.  Patient's vital signs are normal.   Patient is not febrile.  No hypoxia.  Appears stable for continued outpatient treatment.  Tessalon and Zofran given in ED for symptomatic relief.  Final Clinical Impressions(s) / ED Diagnoses   Final diagnoses:  Pneumonia due to infectious organism, unspecified laterality, unspecified part of lung    ED Discharge Orders    None       Linwood Dibbles, MD 10/02/18 1747

## 2018-10-02 NOTE — ED Notes (Signed)
Provided patient 2 sandwiches, 2 sticks of cheese and 2 Sprites. Patient is eating.

## 2018-10-02 NOTE — ED Notes (Signed)
Patient is getting dressed for discharge

## 2018-10-02 NOTE — Discharge Instructions (Signed)
Continue the antibiotics, try taking the tessalon to help with the coughing associated with the infection, continue tylenol to help with the pain

## 2018-10-02 NOTE — ED Notes (Signed)
Patient has been given 1x [employer].

## 2018-10-02 NOTE — ED Triage Notes (Signed)
Patient reports that he had "the most ultimate throw up." last night." Patient angry when writer used the word vomit. Moments later, patient states he states he "threw up a big Lugey." When Clinical research associate asked if he was coughing, writer became angry again and states, "No I did not have a cough."

## 2018-10-10 ENCOUNTER — Emergency Department (HOSPITAL_COMMUNITY)
Admission: EM | Admit: 2018-10-10 | Discharge: 2018-10-10 | Disposition: A | Discharge status: Home / Self Care | Payer: Self-pay | Attending: Emergency Medicine | Admitting: Emergency Medicine

## 2018-10-10 ENCOUNTER — Encounter (HOSPITAL_COMMUNITY): Payer: Self-pay

## 2018-10-10 ENCOUNTER — Other Ambulatory Visit: Payer: Self-pay

## 2018-10-10 DIAGNOSIS — R234 Changes in skin texture: Secondary | ICD-10-CM | POA: Insufficient documentation

## 2018-10-10 DIAGNOSIS — R05 Cough: Secondary | ICD-10-CM | POA: Insufficient documentation

## 2018-10-10 DIAGNOSIS — R059 Cough, unspecified: Secondary | ICD-10-CM

## 2018-10-10 DIAGNOSIS — Z87891 Personal history of nicotine dependence: Secondary | ICD-10-CM | POA: Insufficient documentation

## 2018-10-10 NOTE — Discharge Instructions (Signed)
Try to take good care of your feet.  Try to keep them clean and dry.

## 2018-10-10 NOTE — ED Provider Notes (Signed)
[facility]-EMER[facility] DEPT Provider Note   CSN: [id number] Arrival date & time: 10/10/18  0256    History   Chief Complaint Chief Complaint  Patient presents with  . Foot Pain    HPI Collin White is a 23 y.o. adult.     23 yo M with a chief complaint of bilateral foot pain.  This been going on for weeks.  He feels like it starting to hurt him on the balls of the feet bilaterally.  Denies overt trauma.  Also complaining of pain in the right knee and a mild cough.  The history is provided by the patient.  Foot Pain  This is a chronic problem. The current episode started more than 1 week ago. The problem occurs constantly. The problem has been gradually worsening. Pertinent negatives include no chest pain, no abdominal pain, no headaches and no shortness of breath. The symptoms are aggravated by bending, twisting and walking. Nothing relieves the symptoms. She has tried nothing for the symptoms. The treatment provided no relief.    Past Medical History:  Diagnosis Date  . Migraines   . Psychoses (HCC)   . Schizophrenia (HCC)   . Seizures [facility])     Patient Active Problem List   Diagnosis Date Noted  . Acute episode of schizophrenia with history of multiple episodes (HCC) 06/30/2017  . Schizophrenia, paranoid type (HCC) 06/08/2017  . 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 Psychoses (HCC)   . Paranoid schizophrenia (HCC) 09/29/2015  . Cannabis use disorder, moderate, in early remission (HCC) 09/26/2015  . History of posttraumatic stress disorder (PTSD) 09/26/2015    Past Surgical History:  Procedure Laterality Date  . abd surgery s/p traumatic event    . facial reconstructive surgery    . NO PAST SURGERIES  patient stated he had facial reconstruction surgery as a child   . tumor removed from head           Home Medications    Prior to Admission medications   Medication Sig Start Date End Date Taking? Authorizing Provider  aspirin  EC 325 MG tablet Take 1 tablet (325 mg total) by mouth daily. Patient not taking: Reported on 08/24/2018 08/13/18   Ward, Chase Picket, PA-C  benzonatate (TESSALON) 100 MG capsule Take 1 capsule (100 mg total) by mouth every 8 (eight) hours. Patient not taking: Reported on 10/10/2018 10/02/18   Linwood Dibbles, MD  doxycycline (VIBRAMYCIN) 100 MG capsule Take 1 capsule (100 mg total) by mouth 2 (two) times daily. Patient not taking: Reported on 10/02/2018 09/30/18   Aviva Kluver B, PA-C  ondansetron (ZOFRAN ODT) 4 MG disintegrating tablet Take 1 tablet (4 mg total) by mouth every 8 (eight) hours as needed for nausea or vomiting. Patient not taking: Reported on 08/24/2018 07/22/18   Roxy Horseman, PA-C    Family History Family History  Problem Relation Age of Onset  . Hypertension Other   . Mental illness Neg Hx     Social History Social History   Tobacco Use  . Smoking status: Former Smoker    Types: Cigarettes  . Smokeless tobacco: Never Used  Substance Use Topics  . Alcohol use: No  . Drug use: No     Allergies   Patient has no known allergies.   Review of Systems Review of Systems  Constitutional: Negative for chills and fever.  HENT: Negative for congestion and facial swelling.   Eyes: Negative for discharge and visual disturbance.  Respiratory: Positive for cough. Negative  for shortness of breath.   Cardiovascular: Negative for chest pain and palpitations.  Gastrointestinal: Negative for abdominal pain, diarrhea and vomiting.  Musculoskeletal: Positive for arthralgias and myalgias.  Skin: Negative for color change and rash.  Neurological: Negative for tremors, syncope and headaches.  Psychiatric/Behavioral: Negative for confusion and dysphoric mood.     Physical Exam Updated Vital Signs BP (!) 149/77 (BP Location: Right Arm)   Pulse 92   Temp 97.6 F (36.4 C) (Oral)   Resp 16   SpO2 100%   Physical Exam Vitals signs and nursing note reviewed.  Constitutional:       Appearance: She is well-developed.  HENT:     Head: Normocephalic and atraumatic.  Eyes:     Pupils: Pupils are equal, round, and reactive to light.  Neck:     Musculoskeletal: Normal range of motion and neck supple.     Vascular: No JVD.  Cardiovascular:     Rate and Rhythm: Normal rate and regular rhythm.     Heart sounds: No murmur. No friction rub. No gallop.   Pulmonary:     Effort: No respiratory distress.     Breath sounds: No wheezing.  Abdominal:     General: There is no distension.     Tenderness: There is no guarding or rebound.  Musculoskeletal: Normal range of motion.     Comments: Bilateral feet with signs overuse injury, callus to the ball of both feet.  He has a crack in the skin over the MTP of the right plantar surface.  There is no erythema or drainage.  Skin:    Coloration: Skin is not pale.     Findings: No rash.  Neurological:     Mental Status: She is alert and oriented to person, place, and time.  Psychiatric:        Behavior: Behavior normal.      ED Treatments / Results  Labs (all labs ordered are listed, but only abnormal results are displayed) Labs Reviewed - No data to display  EKG None  Radiology No results found.  Procedures Procedures (including critical care time)  Medications Ordered in ED Medications - No data to display   Initial Impression / Assessment and Plan / ED Course  I have reviewed the triage vital signs and the nursing notes.  Pertinent labs & imaging results that were available during my care of the patient were reviewed by me and considered in my medical decision making (see chart for details).        23 yo M with pain to bilateral feet.  This seems to be an overuse injury.  Discussed general hygiene with the patient.  He is also complaining of a cough.  Has clear lung sounds.  Complaining of right knee pain.  Full range of motion of the right knee without significant tenderness.  Discharge home.  5:30 AM:   I have discussed the diagnosis/risks/treatment options with the patient and believe the pt to be eligible for discharge home to follow-up with PCP. We also discussed returning to the ED immediately if new or worsening sx occur. We discussed the sx which are most concerning (e.g., sudden worsening pain, fever, inability to tolerate by mouth) that necessitate immediate return. Medications administered to the patient during their visit and any new prescriptions provided to the patient are listed below.  Medications given during this visit Medications - No data to display   The patient appears reasonably screen and/or stabilized for discharge and I doubt  any other medical condition or other [facility] requiring further screening, evaluation, or treatment in the ED at this time prior to discharge.    Final Clinical Impressions(s) / ED Diagnoses   Final diagnoses:  Cough  Cracked skin on feet    ED Discharge Orders    None       Melene Plan, DO 10/10/18 0530

## 2018-10-10 NOTE — ED Triage Notes (Signed)
Pt reports "pig in a blanket blisters on his feet."

## 2018-10-12 ENCOUNTER — Emergency Department (HOSPITAL_COMMUNITY)
Admission: EM | Admit: 2018-10-12 | Discharge: 2018-10-13 | Disposition: A | Discharge status: Home / Self Care | Payer: Self-pay | Attending: Emergency Medicine | Admitting: Emergency Medicine

## 2018-10-12 ENCOUNTER — Encounter (HOSPITAL_COMMUNITY): Payer: Self-pay | Admitting: Emergency Medicine

## 2018-10-12 DIAGNOSIS — Z87891 Personal history of nicotine dependence: Secondary | ICD-10-CM | POA: Insufficient documentation

## 2018-10-12 DIAGNOSIS — M79672 Pain in left foot: Secondary | ICD-10-CM | POA: Insufficient documentation

## 2018-10-12 DIAGNOSIS — M79671 Pain in right foot: Secondary | ICD-10-CM | POA: Insufficient documentation

## 2018-10-12 DIAGNOSIS — F209 Schizophrenia, unspecified: Secondary | ICD-10-CM | POA: Insufficient documentation

## 2018-10-12 NOTE — ED Triage Notes (Signed)
Patient c/o "burning" to bilateral feet. Ambulatory.

## 2018-10-12 NOTE — ED Provider Notes (Signed)
[facility]-EMERGENCY DEPT Provider Note   CSN: [id number] Arrival date & time: 10/12/18  2149    History   Chief Complaint Chief Complaint  Patient presents with  . Foot Pain    HPI Collin White is a 23 y.o. adult.     23 year old male who is well-known to this department presents with multiple complaints including bilateral foot pain.  Patient is alert and oriented x4 at this time.  States that this pain is been going on for quite some time.  Denies any new injuries.  Seen 2 days ago for similar symptoms and evaluation was reassuring.     Past Medical History:  Diagnosis Date  . Migraines   . Psychoses (HCC)   . Schizophrenia (HCC)   . Seizures [facility])     Patient Active Problem List   Diagnosis Date Noted  . Acute episode of schizophrenia with history of multiple episodes (HCC) 06/30/2017  . Schizophrenia, paranoid type (HCC) 06/08/2017  . 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 Psychoses (HCC)   . Paranoid schizophrenia (HCC) 09/29/2015  . Cannabis use disorder, moderate, in early remission (HCC) 09/26/2015  . History of posttraumatic stress disorder (PTSD) 09/26/2015    Past Surgical History:  Procedure Laterality Date  . abd surgery s/p traumatic event    . facial reconstructive surgery    . NO PAST SURGERIES  patient stated he had facial reconstruction surgery as a child   . tumor removed from head           Home Medications    Prior to Admission medications   Medication Sig Start Date End Date Taking? Authorizing Provider  aspirin EC 325 MG tablet Take 1 tablet (325 mg total) by mouth daily. Patient not taking: Reported on 08/24/2018 08/13/18   Ward, Chase Picket, PA-C  benzonatate (TESSALON) 100 MG capsule Take 1 capsule (100 mg total) by mouth every 8 (eight) hours. Patient not taking: Reported on 10/10/2018 10/02/18   Linwood Dibbles, MD  doxycycline (VIBRAMYCIN) 100 MG capsule Take 1 capsule (100 mg total) by mouth 2  (two) times daily. Patient not taking: Reported on 10/02/2018 09/30/18   Aviva Kluver B, PA-C  ondansetron (ZOFRAN ODT) 4 MG disintegrating tablet Take 1 tablet (4 mg total) by mouth every 8 (eight) hours as needed for nausea or vomiting. Patient not taking: Reported on 08/24/2018 07/22/18   Roxy Horseman, PA-C    Family History Family History  Problem Relation Age of Onset  . Hypertension Other   . Mental illness Neg Hx     Social History Social History   Tobacco Use  . Smoking status: Former Smoker    Types: Cigarettes  . Smokeless tobacco: Never Used  Substance Use Topics  . Alcohol use: No  . Drug use: No     Allergies   Patient has no known allergies.   Review of Systems Review of Systems  All other systems reviewed and are negative.    Physical Exam Updated Vital Signs BP 135/79 (BP Location: Right Arm)   Pulse (!) 102   Temp 98.3 F (36.8 C) (Oral)   Resp 15   Ht 1.829 m (6')   Wt 63.5 kg   SpO2 93%   BMI 18.99 kg/m   Physical Exam Vitals signs and nursing note reviewed.  Constitutional:      General: She is not in acute distress.    Appearance: Normal appearance. She is well-developed. She is not toxic-appearing.  HENT:  Head: Normocephalic and atraumatic.  Eyes:     General: Lids are normal.     Conjunctiva/sclera: Conjunctivae normal.     Pupils: Pupils are equal, round, and reactive to light.  Neck:     Musculoskeletal: Normal range of motion and neck supple.     Thyroid: No thyroid mass.     Trachea: No tracheal deviation.  Cardiovascular:     Rate and Rhythm: Normal rate and regular rhythm.     Heart sounds: Normal heart sounds. No murmur. No gallop.   Pulmonary:     Effort: Pulmonary effort is normal. No respiratory distress.     Breath sounds: Normal breath sounds. No stridor. No decreased breath sounds, wheezing, rhonchi or rales.  Abdominal:     General: Bowel sounds are normal. There is no distension.     Palpations: Abdomen  is soft.     Tenderness: There is no abdominal tenderness. There is no rebound.  Musculoskeletal: Normal range of motion.        General: No tenderness.  Skin:    General: Skin is warm and dry.     Findings: No abrasion or rash.  Neurological:     Mental Status: She is alert and oriented to person, place, and time. Mental status is at baseline.     GCS: GCS eye subscore is 4. GCS verbal subscore is 5. GCS motor subscore is 6.     Cranial Nerves: No cranial nerve deficit.     Sensory: No sensory deficit.  Psychiatric:        Attention and Perception: She is attentive.        Mood and Affect: Affect is labile.        Speech: Speech is delayed.      ED Treatments / Results  Labs (all labs ordered are listed, but only abnormal results are displayed) Labs Reviewed - No data to display  EKG None  Radiology No results found.  Procedures Procedures (including critical care time)  Medications Ordered in ED Medications - No data to display   Initial Impression / Assessment and Plan / ED Course  I have reviewed the triage vital signs and the nursing notes.  Pertinent labs & imaging results that were available during my care of the patient were reviewed by me and considered in my medical decision making (see chart for details).        Patient does not want to take office she used to show me his feet.  Became very upset and patient decided to leave the department.  He is alert and oriented at time of discharge.  Final Clinical Impressions(s) / ED Diagnoses   Final diagnoses:  None    ED Discharge Orders    None       Lorre Nick, MD 10/12/18 [phone]

## 2018-10-12 NOTE — ED Notes (Signed)
Bed: WTR5 Expected date:  Expected time:  Means of arrival:  Comments: 

## 2018-10-13 NOTE — ED Notes (Signed)
Patient discharged, refused papers. Upon entering room, patient had thrown stool onto walls, floor and chair.

## 2018-11-05 IMAGING — CR DG CERVICAL SPINE COMPLETE 4+V
7 series · 7 of 7 positions shown · non-contrast
Comparison: None.

CLINICAL DATA: Posterior neck pain for several months, no known
injury, initial encounter

EXAM:
CERVICAL SPINE - COMPLETE 4+ VIEW

[w cervical spine lat (1 of 2)]
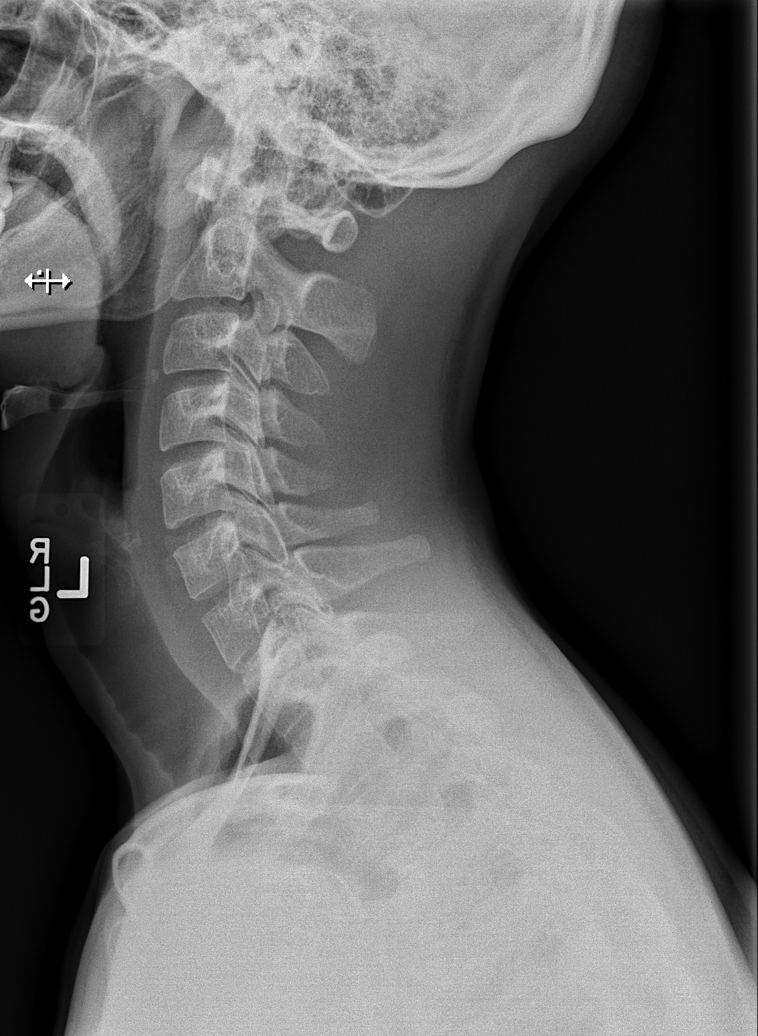

[w cervical spine lat (2 of 2)]
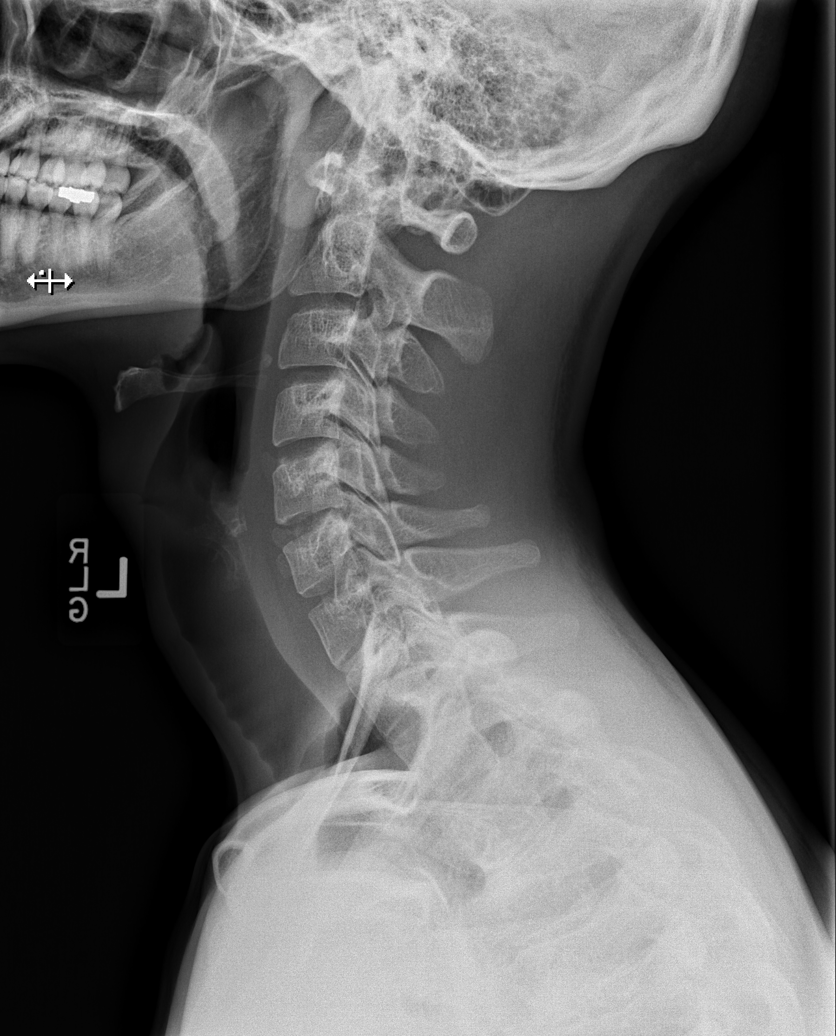

[w cervical spine ap_obl (1 of 2)]
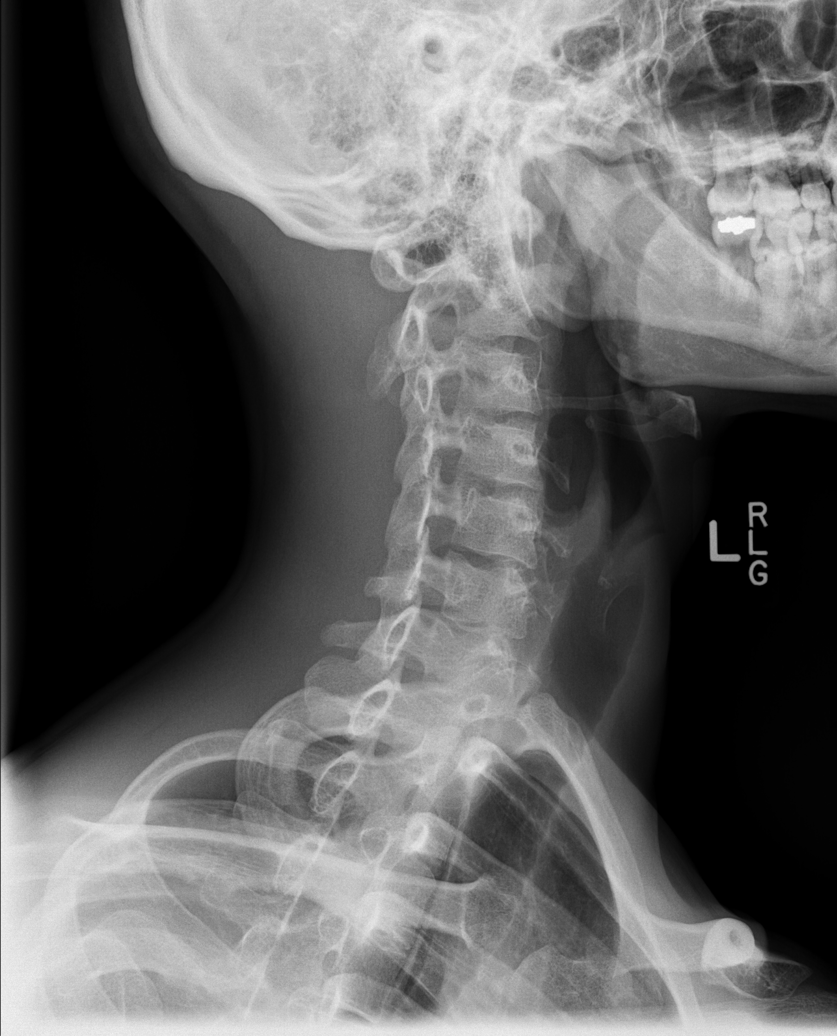

[w cervical spine ap_obl (2 of 2)]
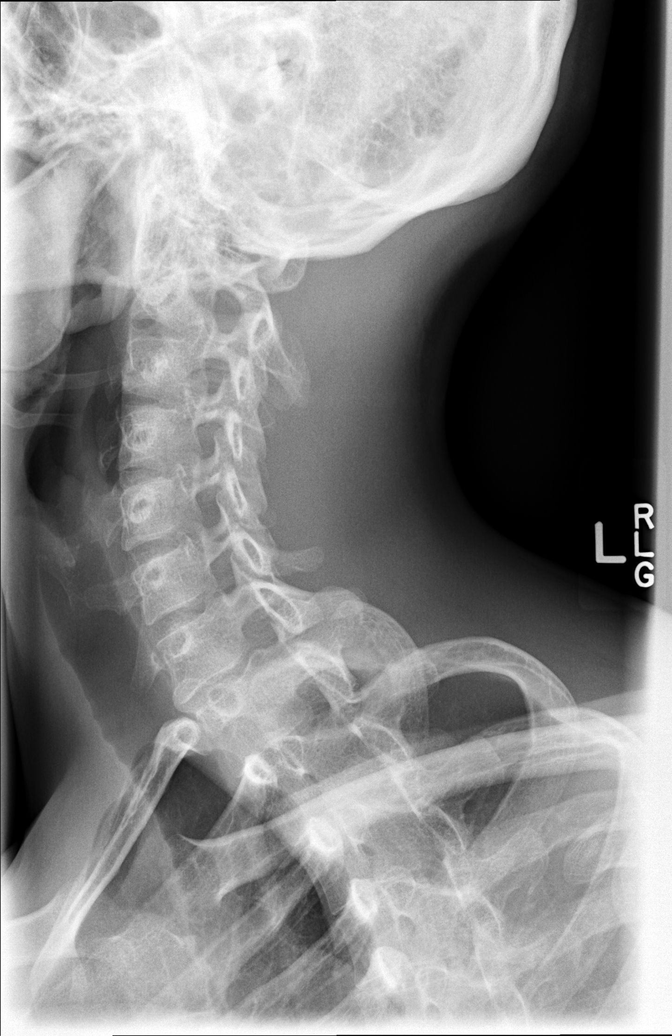

[w cervical spine ap]
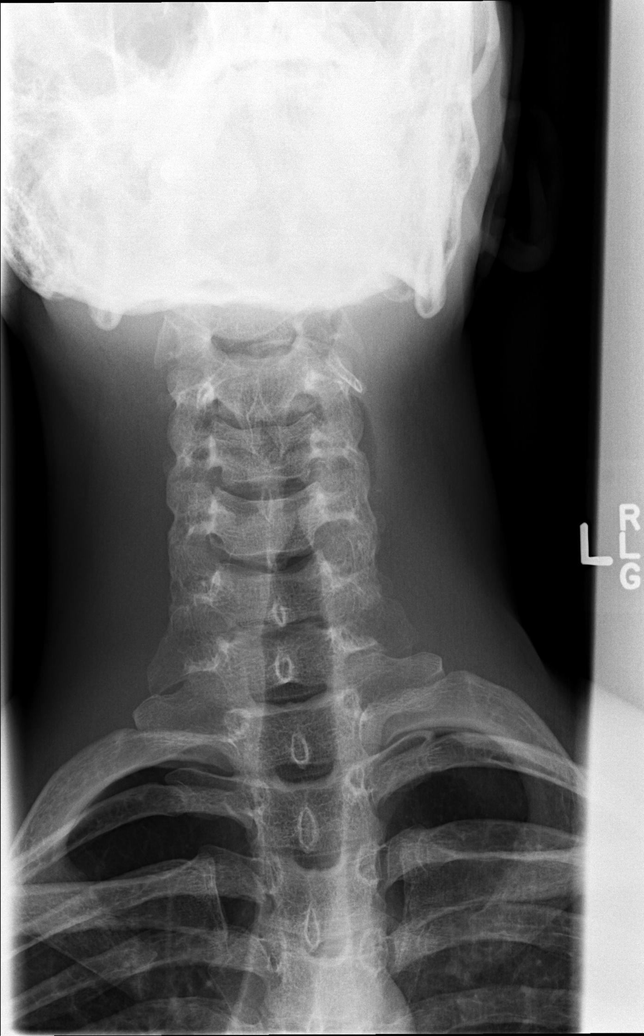

[w cervical spine odontoid (1 of 2)]
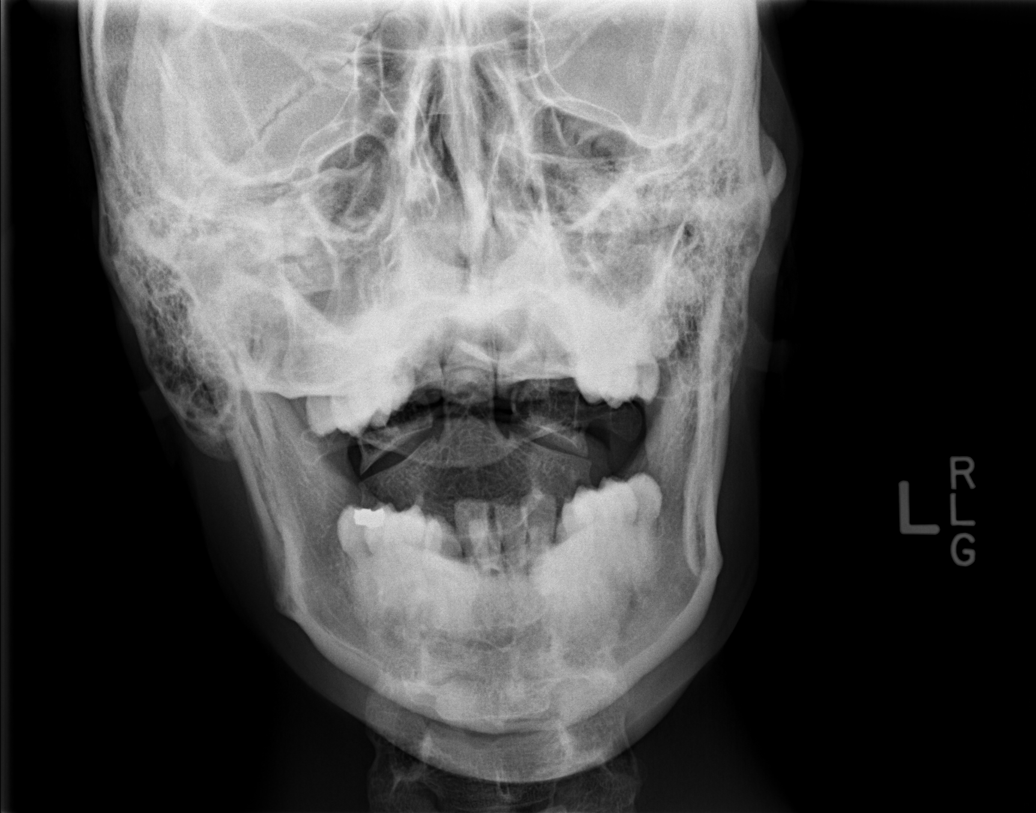

[w cervical spine odontoid (2 of 2)]
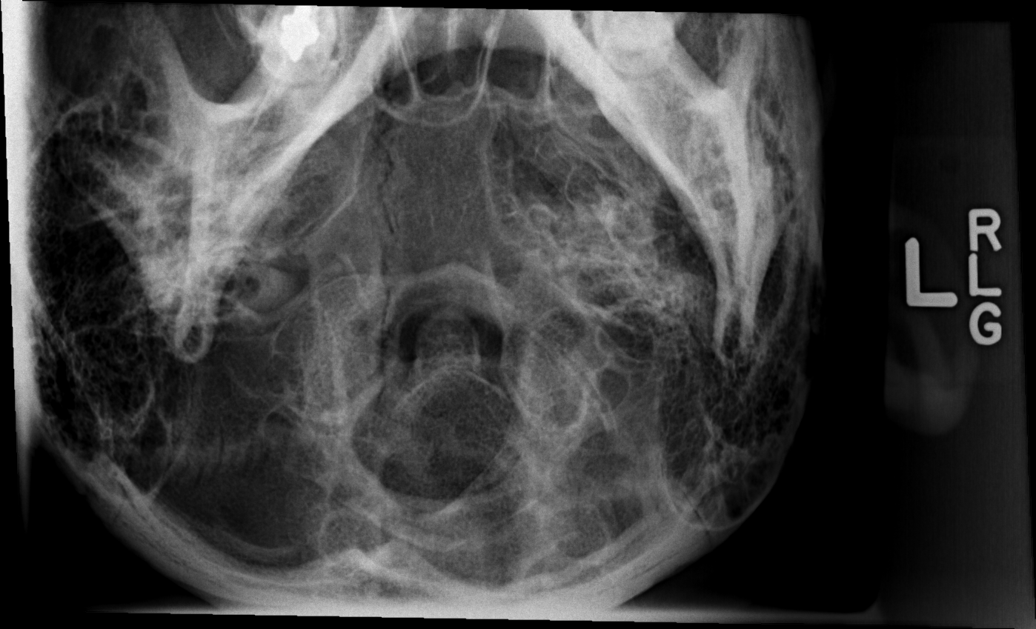

[7 of 7 positions shown; findings below may reference images not displayed]

FINDINGS: There is no evidence of cervical spine fracture or prevertebral soft
tissue swelling. Alignment is normal. No other significant bone
abnormalities are identified.
IMPRESSION: No acute abnormality noted.

## 2018-11-08 ENCOUNTER — Emergency Department (HOSPITAL_COMMUNITY)
Admission: EM | Admit: 2018-11-08 | Discharge: 2018-11-08 | Disposition: A | Discharge status: Home / Self Care | Payer: Self-pay | Attending: Emergency Medicine | Admitting: Emergency Medicine

## 2018-11-08 ENCOUNTER — Other Ambulatory Visit: Payer: Self-pay

## 2018-11-08 ENCOUNTER — Encounter (HOSPITAL_COMMUNITY): Payer: Self-pay | Admitting: *Deleted

## 2018-11-08 DIAGNOSIS — M79632 Pain in left forearm: Secondary | ICD-10-CM | POA: Insufficient documentation

## 2018-11-08 DIAGNOSIS — M79602 Pain in left arm: Secondary | ICD-10-CM

## 2018-11-08 DIAGNOSIS — Z87891 Personal history of nicotine dependence: Secondary | ICD-10-CM | POA: Insufficient documentation

## 2018-11-08 MED ORDER — ACETAMINOPHEN 325 MG PO TABS
650.0000 mg | ORAL_TABLET | Freq: Once | ORAL | Status: AC
  Administered 2018-11-08: 650 mg via ORAL
  Filled 2018-11-08: qty 2

## 2018-11-08 NOTE — ED Triage Notes (Signed)
Pt with multiple complaints. Reporting his left arm is painful, hurts to breath, and feels like his blood pressure is low. Ambulatory to triage with steady gait.

## 2018-11-08 NOTE — ED Notes (Signed)
Patient provided cheese and crackers upon discharge. Provided discharge paperwork and given opportunity to ask questions. Patient A&O x4 and ambulatory at discharge.

## 2018-11-08 NOTE — ED Provider Notes (Signed)
[facility]-EMERGENCY DEPT Provider Note   CSN: [id number] Arrival date & time: 11/08/18  2031    History   Chief Complaint Chief Complaint  Patient presents with  . Arm Pain    HPI Collin White is a 23 y.o. adult with PMHx shizophrenia who is well known to this department who presents to the ED for multiple complaints. He mainly complains of left forearm pain x 2 days. No known injury. States he was walking 2 days ago when he noticed the pain. He also complains of low blood pressure. Pt states he was told last time he was here that his blood pressure was low; reports that his finger was stuck with a needle and read 98.3 and was told his BP was low. BP 109/80 today.       Past Medical History:  Diagnosis Date  . Migraines   . Psychoses (HCC)   . Schizophrenia (HCC)   . Seizures [name])     Patient Active Problem List   Diagnosis Date Noted  . Acute episode of schizophrenia with history of multiple episodes (HCC) 06/30/2017  . Schizophrenia, paranoid type (HCC) 06/08/2017  . 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 Psychoses (HCC)   . Paranoid schizophrenia (HCC) 09/29/2015  . Cannabis use disorder, moderate, in early remission (HCC) 09/26/2015  . History of posttraumatic stress disorder (PTSD) 09/26/2015    Past Surgical History:  Procedure Laterality Date  . abd surgery s/p traumatic event    . facial reconstructive surgery    . NO PAST SURGERIES  patient stated he had facial reconstruction surgery as a child   . tumor removed from head           Home Medications    Prior to Admission medications   Medication Sig Start Date End Date Taking? Authorizing Provider  aspirin EC 325 MG tablet Take 1 tablet (325 mg total) by mouth daily. Patient not taking: Reported on 08/24/2018 08/13/18   Ward, Chase Picket, PA-C  benzonatate (TESSALON) 100 MG capsule Take 1 capsule (100 mg total) by mouth every 8 (eight) hours. Patient not taking:  Reported on 10/10/2018 10/02/18   Linwood Dibbles, MD  doxycycline (VIBRAMYCIN) 100 MG capsule Take 1 capsule (100 mg total) by mouth 2 (two) times daily. Patient not taking: Reported on 10/02/2018 09/30/18   Aviva Kluver B, PA-C  ondansetron (ZOFRAN ODT) 4 MG disintegrating tablet Take 1 tablet (4 mg total) by mouth every 8 (eight) hours as needed for nausea or vomiting. Patient not taking: Reported on 08/24/2018 07/22/18   Roxy Horseman, PA-C    Family History Family History  Problem Relation Age of Onset  . Hypertension Other   . Mental illness Neg Hx     Social History Social History   Tobacco Use  . Smoking status: Former Smoker    Types: Cigarettes  . Smokeless tobacco: Never Used  Substance Use Topics  . Alcohol use: No  . Drug use: No     Allergies   Patient has no known allergies.   Review of Systems Review of Systems  Musculoskeletal: Positive for arthralgias. Negative for joint swelling.  Skin: Negative for wound.     Physical Exam Updated Vital Signs BP 109/80 (BP Location: Left Arm)   Pulse 70   Temp 98.1 F (36.7 C) (Oral)   Resp 16   SpO2 98%   Physical Exam Vitals signs and nursing note reviewed.  Constitutional:      Appearance: She is not ill-appearing.  HENT:     Head: Normocephalic and atraumatic.  Eyes:     Conjunctiva/sclera: Conjunctivae normal.  Cardiovascular:     Rate and Rhythm: Normal rate and regular rhythm.  Pulmonary:     Effort: Pulmonary effort is normal.     Breath sounds: Normal breath sounds.  Musculoskeletal:     Comments: No obvious deformity or swelling to left elbow and left forearm. No tenderness to palpation. Full ROM intact. No tenderness to left wrist or left shoulder. Sensation intact. Strength 5/5.   Skin:    General: Skin is warm and dry.     Coloration: Skin is not jaundiced.  Neurological:     Mental Status: She is alert.      ED Treatments / Results  Labs (all labs ordered are listed, but only abnormal  results are displayed) Labs Reviewed - No data to display  EKG None  Radiology No results found.  Procedures Procedures (including critical care time)  Medications Ordered in ED Medications  acetaminophen (TYLENOL) tablet 650 mg (650 mg Oral Given 11/08/18 2119)     Initial Impression / Assessment and Plan / ED Course  I have reviewed the triage vital signs and the nursing notes.  Pertinent labs & imaging results that were available during my care of the patient were reviewed by me and considered in my medical decision making (see chart for details).    Pt presents complaining of left forearm pain; no known injury. No TTP. Full ROM intact without pain. No obvious injury, deformity, or swelling. Do not feel pt needs imaging at this time. Will give Tylenol and reevaluate.   9:41 PM Pt states pain is still present. He is laying comfortably on stretcher drinking water. He is asking for cheese and crackers at this time and states he will be ready for discharge afterwards. Will comply. Pt stable for discharge.        Final Clinical Impressions(s) / ED Diagnoses   Final diagnoses:  Left arm pain    ED Discharge Orders    None       Tanda Rockers, PA-C 11/08/18 2158    Virgina Norfolk, DO 11/08/18 2203

## 2018-11-08 NOTE — Discharge Instructions (Signed)
You were seen today for left arm pain and given Tylenol for it No imaging was done today Please follow up with [name]ness as needed

## 2019-04-29 ENCOUNTER — Encounter (HOSPITAL_COMMUNITY): Payer: Self-pay | Admitting: *Deleted

## 2019-04-29 ENCOUNTER — Other Ambulatory Visit: Payer: Self-pay

## 2019-04-29 DIAGNOSIS — Z87891 Personal history of nicotine dependence: Secondary | ICD-10-CM | POA: Insufficient documentation

## 2019-04-29 DIAGNOSIS — G44039 Episodic paroxysmal hemicrania, not intractable: Secondary | ICD-10-CM | POA: Insufficient documentation

## 2019-04-29 NOTE — ED Notes (Signed)
Pt states that he was born today and is 23 years old but he doesn't know who he is. Pt came into triage when called and states he cant talk due to dental pain.

## 2019-04-29 NOTE — ED Triage Notes (Signed)
Pt says he "has a migraine" and having left sided jaw pain.

## 2019-04-30 ENCOUNTER — Emergency Department (HOSPITAL_COMMUNITY)
Admission: EM | Admit: 2019-04-30 | Discharge: 2019-04-30 | Disposition: A | Discharge status: Home / Self Care | Payer: Self-pay | Attending: Emergency Medicine | Admitting: Emergency Medicine

## 2019-04-30 DIAGNOSIS — G44039 Episodic paroxysmal hemicrania, not intractable: Secondary | ICD-10-CM

## 2019-04-30 MED ORDER — KETOROLAC TROMETHAMINE 60 MG/2ML IM SOLN
30.0000 mg | Freq: Once | INTRAMUSCULAR | Status: AC
  Administered 2019-04-30: 03:00:00 30 mg via INTRAMUSCULAR
  Filled 2019-04-30: qty 2

## 2019-04-30 NOTE — ED Provider Notes (Signed)
[facility]-EMERGENCY DEPT Provider Note  CSN: [id number] Arrival date & time: 04/29/19 2002  Chief Complaint(s) Headache  HPI Collin White is a 23 y.o. adult   The history is provided by the patient.  Headache Pain location:  L parietal Quality:  Sharp Radiates to:  Does not radiate Onset quality:  Gradual Duration:  5 hours Timing:  Constant Progression:  Waxing and waning Chronicity:  Recurrent Similar to prior headaches: yes   Relieved by:  None tried Worsened by:  Nothing Ineffective treatments:  None tried Associated symptoms: no blurred vision, no congestion, no cough, no fatigue, no fever, no nausea, no URI, no visual change and no vomiting     Past Medical History Past Medical History:  Diagnosis Date   Migraines    Psychoses (HCC)    Schizophrenia (HCC)    Seizures (HCC)    Patient Active Problem List   Diagnosis Date Noted   Acute episode of schizophrenia with history of multiple episodes (HCC) 06/30/2017   Schizophrenia, paranoid type (HCC) 06/08/2017   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 Authorizing Provider  aspirin EC 325 MG tablet Take 1 tablet (325 mg total) by mouth daily. Patient not taking: Reported on 08/24/2018 08/13/18   Ward, Chase Picket, PA-C  benzonatate (TESSALON) 100 MG capsule Take 1 capsule (100 mg total) by mouth every 8 (eight) hours. Patient not taking: Reported on 10/10/2018 10/02/18   Linwood Dibbles, MD  doxycycline (VIBRAMYCIN) 100 MG capsule Take 1 capsule (100 mg total) by mouth 2 (two) times daily. Patient not taking: Reported on 10/02/2018 09/30/18   Aviva Kluver B, PA-C  ondansetron (ZOFRAN ODT) 4 MG  disintegrating tablet Take 1 tablet (4 mg total) by mouth every 8 (eight) hours as needed for nausea or vomiting. Patient not taking: Reported on 08/24/2018 07/22/18   Roxy Horseman, PA-C                                                                                                                                    Past Surgical History Past Surgical History:  Procedure Laterality Date   abd surgery s/p traumatic event     facial reconstructive surgery     NO PAST SURGERIES  patient stated he had facial reconstruction surgery as a child    tumor removed from head      Family History Family History  Problem Relation Age of Onset   Hypertension Other    Mental illness Neg Hx     Social History Social History   Tobacco Use   Smoking status: Former Smoker    Types: Cigarettes   Smokeless tobacco: Never Used  Substance Use Topics   Alcohol use: No   Drug use: No   Allergies Patient has no known allergies.  Review of Systems Review of Systems  Constitutional: Negative for fatigue and fever.  HENT: Negative for congestion.   Eyes: Negative for blurred vision.  Respiratory: Negative for cough.   Gastrointestinal: Negative for nausea and vomiting.  Neurological: Positive for headaches.   All other systems are reviewed and are negative for acute change except as noted in the HPI  Physical Exam Vital Signs  I have reviewed the triage vital signs BP 113/81 (BP Location: Left Arm)    Pulse (!) 52    Temp 97.7 F (36.5 C) (Oral)    Resp 15    SpO2 100%   Physical Exam Vitals signs reviewed.  Constitutional:      General: She is not in acute distress.    Appearance: She is well-developed. She is not diaphoretic.  HENT:     Head: Normocephalic and atraumatic.     Jaw: No trismus.     Right Ear: Tympanic membrane and external ear normal.     Left Ear: Tympanic membrane and external ear normal.     Nose: Nose normal.     Mouth/Throat:     Pharynx: Oropharynx is  clear.  Eyes:     General: No scleral icterus.    Conjunctiva/sclera: Conjunctivae normal.  Neck:     Musculoskeletal: Normal range of motion.     Trachea: Phonation normal.  Cardiovascular:     Rate and Rhythm: Normal rate and regular rhythm.  Pulmonary:     Effort: Pulmonary effort is normal. No respiratory distress.     Breath sounds: No stridor.  Abdominal:     General: There is no distension.  Musculoskeletal: Normal range of motion.  Neurological:     Mental Status: She is alert and oriented to person, place, and time.  Psychiatric:        Behavior: Behavior normal.     ED Results and Treatments Labs (all labs ordered are listed, but only abnormal results are displayed) Labs Reviewed - No data to display                                                                                                                       EKG  EKG Interpretation  Date/Time:    Ventricular Rate:    PR Interval:    QRS Duration:   QT Interval:    QTC Calculation:   R Axis:     Text Interpretation:        Radiology No results found.  Pertinent labs & imaging results that were available during my care of the patient were reviewed by me and considered in my medical decision making (see chart for details).  Medications Ordered in ED Medications  ketorolac (TORADOL) injection 30 mg (30 mg Intramuscular Given 04/30/19 0259)  Procedures Procedures  (including critical care time)  Medical Decision Making / ED Course I have reviewed the nursing notes for this encounter and the patient's prior records (if available in EHR or on provided paperwork).   Collin White was evaluated in Emergency Department on 04/30/2019 for the symptoms described in the history of present illness. She was evaluated in the context of the global COVID-19 pandemic, which  necessitated consideration that the patient might be at risk for infection with the SARS-CoV-2 virus that causes COVID-19. Institutional protocols and algorithms that pertain to the evaluation of patients at risk for COVID-19 are in a state of rapid change based on information released by regulatory bodies including the CDC and federal and state organizations. These policies and algorithms were followed during the patient's care in the ED.  Typical headache for the pt. Non focal neuro exam. No recent head trauma. No fever. Doubt meningitis. Doubt intracranial bleed. Doubt IIH. No indication for imaging.   Improved with toradol.  The patient appears reasonably screened and/or stabilized for discharge and I doubt any other medical condition or other [facility] requiring further screening, evaluation, or treatment in the ED at this time prior to discharge.  The patient is safe for discharge with strict return precautions.         Final Clinical Impression(s) / ED Diagnoses Final diagnoses:  Episodic paroxysmal hemicrania, not intractable     The patient appears reasonably screened and/or stabilized for discharge and I doubt any other medical condition or other [facility] requiring further screening, evaluation, or treatment in the ED at this time prior to discharge.  Disposition: Discharge  Condition: Good  I have discussed the results, Dx and Tx plan with the patient who expressed understanding and agree(s) with the plan. Discharge instructions discussed at great length. The patient was given strict return precautions who verbalized understanding of the instructions. No further questions at time of discharge.    ED Discharge Orders    None        This chart was dictated using voice recognition software.  Despite best efforts to proofread,  errors can occur which can change the documentation meaning.   Nira Connardama, Lindsea Olivar Eduardo, MD 04/30/19 [phone]

## 2019-04-30 NOTE — ED Notes (Signed)
Pt smoking in the lobby twice, discussed with pt no smoking in the lobby. Escorted pt outside.

## 2019-04-30 NOTE — ED Notes (Signed)
Pt's cigarettes taken from pt and kept at nurses station until pt is discharged.

## 2019-05-01 ENCOUNTER — Other Ambulatory Visit: Payer: Self-pay

## 2019-05-01 ENCOUNTER — Emergency Department (HOSPITAL_COMMUNITY)
Admission: EM | Admit: 2019-05-01 | Discharge: 2019-05-01 | Disposition: A | Discharge status: Home / Self Care | Payer: Self-pay | Attending: Emergency Medicine | Admitting: Emergency Medicine

## 2019-05-01 ENCOUNTER — Encounter (HOSPITAL_COMMUNITY): Payer: Self-pay | Admitting: Emergency Medicine

## 2019-05-01 DIAGNOSIS — X58XXXA Exposure to other specified factors, initial encounter: Secondary | ICD-10-CM | POA: Insufficient documentation

## 2019-05-01 DIAGNOSIS — S90522A Blister (nonthermal), left ankle, initial encounter: Secondary | ICD-10-CM | POA: Insufficient documentation

## 2019-05-01 DIAGNOSIS — Z87891 Personal history of nicotine dependence: Secondary | ICD-10-CM | POA: Insufficient documentation

## 2019-05-01 DIAGNOSIS — R51 Headache: Secondary | ICD-10-CM | POA: Insufficient documentation

## 2019-05-01 DIAGNOSIS — Y929 Unspecified place or not applicable: Secondary | ICD-10-CM | POA: Insufficient documentation

## 2019-05-01 DIAGNOSIS — Y939 Activity, unspecified: Secondary | ICD-10-CM | POA: Insufficient documentation

## 2019-05-01 DIAGNOSIS — Y998 Other external cause status: Secondary | ICD-10-CM | POA: Insufficient documentation

## 2019-05-01 MED ORDER — BACITRACIN ZINC 500 UNIT/GM EX OINT: 1.0000 "application " | TOPICAL_OINTMENT | Freq: Two times a day (BID) | CUTANEOUS | 0 refills | Status: DC

## 2019-05-01 MED ORDER — BACITRACIN ZINC 500 UNIT/GM EX OINT
TOPICAL_OINTMENT | Freq: Two times a day (BID) | CUTANEOUS | Status: DC
  Administered 2019-05-01: 1 via TOPICAL
  Filled 2019-05-01: qty 1.8

## 2019-05-01 NOTE — ED Notes (Signed)
Called for vitals recheck. Pt did not answer.

## 2019-05-01 NOTE — Discharge Instructions (Signed)
Apply bacitracin to your blister twice daily for 1 week.

## 2019-05-01 NOTE — ED Triage Notes (Addendum)
PD went out for a welfare check, social worker per PD told them to bring him here. Patient states that he thought he was okay, but since he is here he would like to be checked out for a blister he has on his foot.

## 2019-05-01 NOTE — ED Notes (Signed)
Pt states "there is no Denyse Amass and Livingston Diones has died. I'm Mrs. 10/21/2022."

## 2019-05-01 NOTE — ED Provider Notes (Signed)
[facility]-EMERGENCY DEPT Provider Note   CSN: [id number] Arrival date & time: 05/01/19  0114     History   Chief Complaint Chief Complaint  Patient presents with  . Blister    HPI Kerrin MoMalcolm Topor is a 23 y.o. adult.     The history is provided by the patient and medical records. No language interpreter was used.     23 year old male with history of schizophrenia, recurrent migraine, polysubstance abuse sent here for evaluation of blister.  Patient noticed a blister on the back of his left ankle last night.  It is mildly tender.  He does not know how it happened.  He denies any specific injury.  No fever.  He does have psychiatric illness, and has recurrent headache.  Patient report his head feels a bit uncomfortable and states that it feels "wobbly" this is not entirely unusual.  Patient was seen in the ED yesterday for same received Toradol which improved his symptoms and he was discharged.  Past Medical History:  Diagnosis Date  . Migraines   . Psychoses (HCC)   . Schizophrenia (HCC)   . Seizures [facility](HCC)     Patient Active Problem List   Diagnosis Date Noted  . Acute episode of schizophrenia with history of multiple episodes (HCC) 06/30/2017  . Schizophrenia, paranoid type (HCC) 06/08/2017  . 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 Psychoses (HCC)   . Paranoid schizophrenia (HCC) 09/29/2015  . Cannabis use disorder, moderate, in early remission (HCC) 09/26/2015  . History of posttraumatic stress disorder (PTSD) 09/26/2015    Past Surgical History:  Procedure Laterality Date  . abd surgery s/p traumatic event    . facial reconstructive surgery    . NO PAST SURGERIES  patient stated he had facial reconstruction surgery as a child   . tumor removed from head           Home Medications    Prior to Admission medications   Medication Sig Start Date End Date Taking? Authorizing Provider  aspirin EC 325 MG tablet Take 1 tablet (325  mg total) by mouth daily. Patient not taking: Reported on 08/24/2018 08/13/18   Ward, Chase PicketJaime Pilcher, PA-C  benzonatate (TESSALON) 100 MG capsule Take 1 capsule (100 mg total) by mouth every 8 (eight) hours. Patient not taking: Reported on 10/10/2018 10/02/18   Linwood DibblesKnapp, Jon, MD  doxycycline (VIBRAMYCIN) 100 MG capsule Take 1 capsule (100 mg total) by mouth 2 (two) times daily. Patient not taking: Reported on 10/02/2018 09/30/18   Aviva KluverMurray, Alyssa B, PA-C  ondansetron (ZOFRAN ODT) 4 MG disintegrating tablet Take 1 tablet (4 mg total) by mouth every 8 (eight) hours as needed for nausea or vomiting. Patient not taking: Reported on 08/24/2018 07/22/18   Roxy HorsemanBrowning, Robert, PA-C    Family History Family History  Problem Relation Age of Onset  . Hypertension Other   . Mental illness Neg Hx     Social History Social History   Tobacco Use  . Smoking status: Former Smoker    Types: Cigarettes  . Smokeless tobacco: Never Used  Substance Use Topics  . Alcohol use: No  . Drug use: No     Allergies   Patient has no known allergies.   Review of Systems Review of Systems  Constitutional: Negative for fever.  Skin: Positive for wound.     Physical Exam Updated Vital Signs BP (!) 144/79 (BP Location: Left Arm)   Pulse 69   Temp 98.6 F (37 C) (Oral)  Resp 18   Ht 5\' 9"  (1.753 m)   Wt 63.5 kg   SpO2 97%   BMI 20.67 kg/m   Physical Exam Vitals signs and nursing note reviewed.  Constitutional:      General: She is not in acute distress.    Appearance: She is well-developed.     Comments: Disheveled in appearance  HENT:     Head: Atraumatic.  Eyes:     Conjunctiva/sclera: Conjunctivae normal.  Neck:     Musculoskeletal: Neck supple.  Skin:    Findings: No rash.     Comments: Left ankle: There is a 4 mm circular shallow ulceration noted to the lateral posterior aspect of the ankle mildly tender to palpation without surrounding skin erythema.  Neurological:     Mental Status: She is  alert.      ED Treatments / Results  Labs (all labs ordered are listed, but only abnormal results are displayed) Labs Reviewed - No data to display  EKG None  Radiology No results found.  Procedures Procedures (including critical care time)  Medications Ordered in ED Medications  bacitracin ointment (has no administration in time range)     Initial Impression / Assessment and Plan / ED Course  I have reviewed the triage vital signs and the nursing notes.  Pertinent labs & imaging results that were available during my care of the patient were reviewed by me and considered in my medical decision making (see chart for details).        BP (!) 144/79 (BP Location: Left Arm)   Pulse 69   Temp 98.6 F (37 C) (Oral)   Resp 18   Ht 5\' 9"  (1.753 m)   Wt 63.5 kg   SpO2 97%   BMI 20.67 kg/m    Final Clinical Impressions(s) / ED Diagnoses   Final diagnoses:  Blister of left ankle, initial encounter    ED Discharge Orders         Ordered    bacitracin ointment  2 times daily     05/01/19 0743         7:40 AM Patient with a shallow ulceration to the back of his left ankle likely from a ruptured blister.  Will apply bacitracin.  He has recurrent headache which is not new and headache is mild therefore patient will be discharged to follow-up with PCP.   Domenic Moras, PA-C 05/01/19 [place], Wenda Overland, MD 05/03/19 [phone]

## 2019-05-01 NOTE — ED Notes (Signed)
Pt given po fluids, bacitracin and d/c instructions.

## 2019-05-03 ENCOUNTER — Emergency Department (HOSPITAL_COMMUNITY)
Admission: EM | Admit: 2019-05-03 | Discharge: 2019-05-03 | Disposition: A | Discharge status: Home / Self Care | Payer: Self-pay | Attending: Emergency Medicine | Admitting: Emergency Medicine

## 2019-05-03 ENCOUNTER — Encounter (HOSPITAL_COMMUNITY): Payer: Self-pay | Admitting: Family Medicine

## 2019-05-03 DIAGNOSIS — Y929 Unspecified place or not applicable: Secondary | ICD-10-CM | POA: Insufficient documentation

## 2019-05-03 DIAGNOSIS — S90512A Abrasion, left ankle, initial encounter: Secondary | ICD-10-CM | POA: Insufficient documentation

## 2019-05-03 DIAGNOSIS — W2209XA Striking against other stationary object, initial encounter: Secondary | ICD-10-CM | POA: Insufficient documentation

## 2019-05-03 DIAGNOSIS — Z87891 Personal history of nicotine dependence: Secondary | ICD-10-CM | POA: Insufficient documentation

## 2019-05-03 DIAGNOSIS — T148XXA Other injury of unspecified body region, initial encounter: Secondary | ICD-10-CM

## 2019-05-03 DIAGNOSIS — Z5900 Homelessness unspecified: Secondary | ICD-10-CM

## 2019-05-03 DIAGNOSIS — Y999 Unspecified external cause status: Secondary | ICD-10-CM | POA: Insufficient documentation

## 2019-05-03 DIAGNOSIS — Y939 Activity, unspecified: Secondary | ICD-10-CM | POA: Insufficient documentation

## 2019-05-03 NOTE — ED Notes (Signed)
Offered to clean his abrasion, but patient refused.

## 2019-05-03 NOTE — ED Provider Notes (Signed)
[facility] DEPT Provider Note   CSN: [id number] Arrival date & time: 05/03/19  1915     History   Chief Complaint Chief Complaint  Patient presents with  . Abrasion    HPI Collin White is a 23 y.o. adult.     23 y.o male with a PMH of Migraines, Schizophrenia, Cannabis presents to the ED with a chief complaint of left ankle abrasion.  Patient reports he scraped his left ankle on a median.  He is unable to provide further history, patient is verbally aggressive stating "can you just listen to me ", when asked what he wanted me to do about his abrasion, patient screamed at me "you do not know anything ".  Patient then ambulated out of his room.  The history is provided by the patient and medical records.    Past Medical History:  Diagnosis Date  . Migraines   . Psychoses ([place])   . Schizophrenia ([place])   . Seizures [facility])     Patient Active Problem List   Diagnosis Date Noted  . Acute episode of schizophrenia with history of multiple episodes ([place]) 06/30/2017  . Schizophrenia, paranoid type ([place]) 06/08/2017  . 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 Psychoses ([place])   . Paranoid schizophrenia ([place]) 09/29/2015  . Cannabis use disorder, moderate, in early remission ([place]) 09/26/2015  . History of posttraumatic stress disorder (PTSD) 09/26/2015    Past Surgical History:  Procedure Laterality Date  . abd surgery s/p traumatic event    . facial reconstructive surgery    . NO PAST SURGERIES  patient stated he had facial reconstruction surgery as a child   . tumor removed from head           Home Medications    Prior to Admission medications   Medication Sig Start Date End Date Taking? Authorizing Provider  bacitracin ointment Apply 1 application topically 2 (two) times daily. 05/01/19   Domenic Moras, PA-C    Family History Family History  Problem Relation Age of Onset  . Hypertension Other   . Mental illness Neg Hx      Social History Social History   Tobacco Use  . Smoking status: Former Smoker    Types: Cigarettes  . Smokeless tobacco: Never Used  Substance Use Topics  . Alcohol use: No  . Drug use: No     Allergies   Patient has no known allergies.   Review of Systems Review of Systems  Unable to perform ROS: Psychiatric disorder     Physical Exam Updated Vital Signs BP 115/88 (BP Location: Right Arm)   Pulse 70   Temp 98.1 F (36.7 C) (Oral)   Resp 14   SpO2 98%   Physical Exam Vitals signs and nursing note reviewed.  Constitutional:      Appearance: Normal appearance.  HENT:     Nose: No rhinorrhea.  Cardiovascular:     Rate and Rhythm: Normal rate.  Pulmonary:     Effort: Pulmonary effort is normal.  Abdominal:     General: Abdomen is flat.  Neurological:     Mental Status: She is alert. Mental status is at baseline.      ED Treatments / Results  Labs (all labs ordered are listed, but only abnormal results are displayed) Labs Reviewed - No data to display  EKG None  Radiology No results found.  Procedures Procedures (including critical care time)  Medications Ordered in ED Medications - No data to display  Initial Impression / Assessment and Plan / ED Course  I have reviewed the triage vital signs and the nursing notes.  Pertinent labs & imaging results that were available during my care of the patient were reviewed by me and considered in my medical decision making (see chart for details).       Patient with multiple visits to the ED presents to the ED with complaints of left ankle abrasion, reports this happened from his shoes along with scraping it with the median.  No medical therapy prior to arrival in the ED.  Patient denied any fevers.  He then became verbally aggressive, unable to obtain further history from patient.   It appears that he has multiple visits to the ED, attempted to provide patient with Band-Aid, he then proceeded to scream  at me.  Patient eloped from room.   Portions of this note were generated with Scientist, clinical (histocompatibility and immunogenetics)Dragon dictation software. Dictation errors may occur despite best attempts at proofreading.    Final Clinical Impressions(s) / ED Diagnoses   Final diagnoses:  Abrasion  Homelessness    ED Discharge Orders    None       Claude MangesSoto, Annabell Oconnor, Cordelia Poche-C 05/08/19 1122    Linwood DibblesKnapp, Jon, MD 05/10/19 [phone]

## 2019-05-03 NOTE — ED Triage Notes (Signed)
Patient is complaining of a right, medial ankle abrasion. No signs of infection noted. Bleeding controlled. Patient is ambulatory.

## 2019-05-05 ENCOUNTER — Encounter (HOSPITAL_COMMUNITY): Payer: Self-pay | Admitting: Emergency Medicine

## 2019-05-05 ENCOUNTER — Emergency Department (HOSPITAL_COMMUNITY)
Admission: EM | Admit: 2019-05-05 | Discharge: 2019-05-05 | Disposition: A | Discharge status: Home / Self Care | Payer: Self-pay | Attending: Emergency Medicine | Admitting: Emergency Medicine

## 2019-05-05 ENCOUNTER — Other Ambulatory Visit: Payer: Self-pay

## 2019-05-05 ENCOUNTER — Emergency Department (HOSPITAL_COMMUNITY)
Admission: EM | Admit: 2019-05-05 | Discharge: 2019-05-06 | Disposition: A | Discharge status: Home / Self Care | Payer: Self-pay | Attending: Emergency Medicine | Admitting: Emergency Medicine

## 2019-05-05 DIAGNOSIS — S339XXA Sprain of unspecified parts of lumbar spine and pelvis, initial encounter: Secondary | ICD-10-CM | POA: Insufficient documentation

## 2019-05-05 DIAGNOSIS — Y939 Activity, unspecified: Secondary | ICD-10-CM | POA: Insufficient documentation

## 2019-05-05 DIAGNOSIS — T148XXA Other injury of unspecified body region, initial encounter: Secondary | ICD-10-CM

## 2019-05-05 DIAGNOSIS — Y999 Unspecified external cause status: Secondary | ICD-10-CM | POA: Insufficient documentation

## 2019-05-05 DIAGNOSIS — S90511A Abrasion, right ankle, initial encounter: Secondary | ICD-10-CM | POA: Insufficient documentation

## 2019-05-05 DIAGNOSIS — Z87891 Personal history of nicotine dependence: Secondary | ICD-10-CM | POA: Insufficient documentation

## 2019-05-05 DIAGNOSIS — Y929 Unspecified place or not applicable: Secondary | ICD-10-CM | POA: Insufficient documentation

## 2019-05-05 DIAGNOSIS — Y9301 Activity, walking, marching and hiking: Secondary | ICD-10-CM | POA: Insufficient documentation

## 2019-05-05 DIAGNOSIS — Z590 Homelessness: Secondary | ICD-10-CM | POA: Insufficient documentation

## 2019-05-05 DIAGNOSIS — T07XXXA Unspecified multiple injuries, initial encounter: Secondary | ICD-10-CM

## 2019-05-05 DIAGNOSIS — S90512A Abrasion, left ankle, initial encounter: Secondary | ICD-10-CM | POA: Insufficient documentation

## 2019-05-05 DIAGNOSIS — X58XXXA Exposure to other specified factors, initial encounter: Secondary | ICD-10-CM | POA: Insufficient documentation

## 2019-05-05 DIAGNOSIS — X503XXA Overexertion from repetitive movements, initial encounter: Secondary | ICD-10-CM | POA: Insufficient documentation

## 2019-05-05 MED ORDER — BACITRACIN ZINC 500 UNIT/GM EX OINT
TOPICAL_OINTMENT | Freq: Two times a day (BID) | CUTANEOUS | Status: DC
  Administered 2019-05-05: 07:00:00 via TOPICAL

## 2019-05-05 NOTE — ED Triage Notes (Signed)
Patient is complaining of throwing up orange juice and pulling his spine when he threw up.

## 2019-05-05 NOTE — ED Provider Notes (Signed)
[facility]-EMERGENC[facility] DEPT Provider Note   CSN: [id number] Arrival date & time: 05/05/19  2110     History   Chief Complaint Chief Complaint  Patient presents with  . Emesis    HPI Collin White is a 23 y.o. adult.     Patient presents to the emergency department with a chief complaint of muscle strain.  He states that he "spit up some orange juice" and pulled a muscle in his back.  He denies any other symptoms at this time.  He is well-known to this emergency department.  Denies any treatments prior to arrival.  Denies any other associated symptoms.  Reports symptoms are worsened with movement.  The history is provided by the patient. No language interpreter was used.    Past Medical History:  Diagnosis Date  . Migraines   . Psychoses (HCC)   . Schizophrenia (HCC)   . Seizures [facility](HCC)     Patient Active Problem List   Diagnosis Date Noted  . Acute episode of schizophrenia with history of multiple episodes (HCC) 06/30/2017  . Schizophrenia, paranoid type (HCC) 06/08/2017  . 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 Psychoses (HCC)   . Paranoid schizophrenia (HCC) 09/29/2015  . Cannabis use disorder, moderate, in early remission (HCC) 09/26/2015  . History of posttraumatic stress disorder (PTSD) 09/26/2015    Past Surgical History:  Procedure Laterality Date  . abd surgery s/p traumatic event    . facial reconstructive surgery    . NO PAST SURGERIES  patient stated he had facial reconstruction surgery as a child   . tumor removed from head           Home Medications    Prior to Admission medications   Medication Sig Start Date End Date Taking? Authorizing Provider  bacitracin ointment Apply 1 application topically 2 (two) times daily. 05/01/19   Fayrene Helperran, [place], PA-C    Family History Family History  Problem Relation Age of Onset  . Hypertension Other   . Mental illness Neg Hx     Social History Social History   Tobacco Use   . Smoking status: Former Smoker    Types: Cigarettes  . Smokeless tobacco: Never Used  Substance Use Topics  . Alcohol use: No  . Drug use: No     Allergies   Patient has no known allergies.   Review of Systems Review of Systems  All other systems reviewed and are negative.    Physical Exam Updated Vital Signs BP (!) 132/50 (BP Location: Left Arm)   Pulse 63   Temp 97.9 F (36.6 C) (Oral)   Resp 18   Ht 5\' 9"  (1.753 m)   Wt 63.5 kg   SpO2 100%   BMI 20.67 kg/m   Physical Exam Vitals signs and nursing note reviewed.  Constitutional:      General: She is not in acute distress.    Appearance: She is well-developed.  HENT:     Head: Normocephalic and atraumatic.  Eyes:     Conjunctiva/sclera: Conjunctivae normal.  Neck:     Musculoskeletal: Neck supple.  Cardiovascular:     Rate and Rhythm: Normal rate.     Heart sounds: No murmur.  Pulmonary:     Effort: Pulmonary effort is normal. No respiratory distress.  Abdominal:     General: There is no distension.  Musculoskeletal:     Comments: Moves all extremities  Skin:    General: Skin is warm and dry.  Neurological:  Mental Status: She is alert and oriented to person, place, and time.  Psychiatric:        Mood and Affect: Mood normal.        Behavior: Behavior normal.      ED Treatments / Results  Labs (all labs ordered are listed, but only abnormal results are displayed) Labs Reviewed - No data to display  EKG None  Radiology No results found.  Procedures Procedures (including critical care time)  Medications Ordered in ED Medications - No data to display   Initial Impression / Assessment and Plan / ED Course  I have reviewed the triage vital signs and the nursing notes.  Pertinent labs & imaging results that were available during my care of the patient were reviewed by me and considered in my medical decision making (see chart for details).       Patient with muscle strain.  Patient states they strained their back.  Patient's vital signs are stable.  Patient's is in no acute distress.  Do not feel that any further work-up is indicated at this time.  Final Clinical Impressions(s) / ED Diagnoses   Final diagnoses:  Muscle strain    ED Discharge Orders    None       Montine Circle, Hershal Coria 05/05/19 2353    Palumbo, April, MD 05/05/19 2355

## 2019-05-05 NOTE — ED Provider Notes (Signed)
[facility]-EMERGENCY DEPT Provider Note   CSN: [id number] Arrival date & time: 05/05/19  0246     History   Chief Complaint Chief Complaint  Patient presents with  . Wound Check    HPI Collin White is a 23 y.o. adult (she/her/hers) with a history of schizophrenia, psychosis, homelessness, and seizures who presents to the emergency department with a chief complaint of right ankle wounds. The patient does not wear socks with her shoes. Her ankles have been rubbing against her shoes while walking.    This is the patient's third visit for the same in the last 4 days.  No fevers, chills, purulent drainage, redness, or warmth.  No treatment prior to arrival.     The history is provided by the patient. No language interpreter was used.    Past Medical History:  Diagnosis Date  . Migraines   . Psychoses (HCC)   . Schizophrenia (HCC)   . Seizures [name](HCC)     Patient Active Problem List   Diagnosis Date Noted  . Acute episode of schizophrenia with history of multiple episodes (HCC) 06/30/2017  . Schizophrenia, paranoid type (HCC) 06/08/2017  . 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 Psychoses (HCC)   . Paranoid schizophrenia (HCC) 09/29/2015  . Cannabis use disorder, moderate, in early remission (HCC) 09/26/2015  . History of posttraumatic stress disorder (PTSD) 09/26/2015    Past Surgical History:  Procedure Laterality Date  . abd surgery s/p traumatic event    . facial reconstructive surgery    . NO PAST SURGERIES  patient stated he had facial reconstruction surgery as a child   . tumor removed from head           Home Medications    Prior to Admission medications   Medication Sig Start Date End Date Taking? Authorizing Provider  bacitracin ointment Apply 1 application topically 2 (two) times daily. 05/01/19   Fayrene Helperran, [place], PA-C    Family History Family History  Problem Relation Age of Onset  . Hypertension Other   . Mental  illness Neg Hx     Social History Social History   Tobacco Use  . Smoking status: Former Smoker    Types: Cigarettes  . Smokeless tobacco: Never Used  Substance Use Topics  . Alcohol use: No  . Drug use: No     Allergies   Patient has no known allergies.   Review of Systems Review of Systems  Constitutional: Negative for activity change, chills and fever.  Respiratory: Negative for shortness of breath.   Cardiovascular: Negative for chest pain.  Gastrointestinal: Negative for abdominal pain, diarrhea and nausea.  Genitourinary: Negative for dysuria.  Musculoskeletal: Negative for back pain.  Skin: Positive for wound. Negative for rash.  Allergic/Immunologic: Negative for immunocompromised state.  Neurological: Negative for headaches.  Psychiatric/Behavioral: Negative for confusion.   Physical Exam Updated Vital Signs BP 116/70 (BP Location: Right Arm)   Pulse (!) 59   Temp 98 F (36.7 C) (Oral)   Resp 19   SpO2 99%   Physical Exam Vitals signs and nursing note reviewed.  Constitutional:      Appearance: She is well-developed.  HENT:     Head: Normocephalic and atraumatic.  Neck:     Musculoskeletal: Normal range of motion.  Musculoskeletal: Normal range of motion.  Skin:    Comments: Superficial abrasions noted on the bilateral ankles.  No purulence.  No overlying warmth or erythema.  Full active and passive range of motion of  the bilateral ankles. NVI.  Neurological:     Mental Status: She is alert and oriented to person, place, and time.    ED Treatments / Results  Labs (all labs ordered are listed, but only abnormal results are displayed) Labs Reviewed - No data to display  EKG None  Radiology No results found.  Procedures Procedures (including critical care time)  Medications Ordered in ED Medications  bacitracin ointment ( Topical Given 05/05/19 0656)     Initial Impression / Assessment and Plan / ED Course  I have reviewed the triage  vital signs and the nursing notes.  Pertinent labs & imaging results that were available during my care of the patient were reviewed by me and considered in my medical decision making (see chart for details).        23 year old male with a history of schizophrenia, psychosis, homelessness, and seizures who presents to the emergency department with bilateral superficial abrasions to the ankles.  No evidence of cellulitis or abscess.  Wound care provided in the ER.  Patient declined Tylenol for pain control.  The patient was offered socks.  Safe for discharge with wound care instructions.  Final Clinical Impressions(s) / ED Diagnoses   Final diagnoses:  Abrasions of multiple sites    ED Discharge Orders    None       Joanne Gavel, PA-C 05/05/19 [place], April, MD 05/05/19 2354

## 2019-05-05 NOTE — ED Triage Notes (Signed)
Patient here wanting to be seen for abrasions on right ankle. Seen for same.

## 2019-05-05 NOTE — Discharge Instructions (Signed)
Thank you for allowing me to care for you today in the Emergency Department.   Keep the area clean by washing at least once daily with warm water and soap.  Apply a topical antibiotic, such as bacitracin or Neosporin to the wounds.  Wear socks with your shoes to avoid rubbing the skin against your shoes.   Take Tylenol or ibuprofen for pain.  Return to the emergency department if you develop fever, chills, thick, mucus-like

## 2019-05-11 ENCOUNTER — Encounter (HOSPITAL_COMMUNITY): Payer: Self-pay | Admitting: Emergency Medicine

## 2019-05-11 ENCOUNTER — Other Ambulatory Visit: Payer: Self-pay

## 2019-05-11 ENCOUNTER — Emergency Department (HOSPITAL_COMMUNITY)
Admission: EM | Admit: 2019-05-11 | Discharge: 2019-05-11 | Disposition: A | Discharge status: Home / Self Care | Payer: Self-pay | Attending: Emergency Medicine | Admitting: Emergency Medicine

## 2019-05-11 DIAGNOSIS — M79672 Pain in left foot: Secondary | ICD-10-CM | POA: Insufficient documentation

## 2019-05-11 DIAGNOSIS — R4183 Borderline intellectual functioning: Secondary | ICD-10-CM | POA: Insufficient documentation

## 2019-05-11 DIAGNOSIS — R21 Rash and other nonspecific skin eruption: Secondary | ICD-10-CM | POA: Insufficient documentation

## 2019-05-11 DIAGNOSIS — Z87891 Personal history of nicotine dependence: Secondary | ICD-10-CM | POA: Insufficient documentation

## 2019-05-11 DIAGNOSIS — F640 Transsexualism: Secondary | ICD-10-CM | POA: Insufficient documentation

## 2019-05-11 MED ORDER — BACITRACIN ZINC 500 UNIT/GM EX OINT
TOPICAL_OINTMENT | Freq: Two times a day (BID) | CUTANEOUS | Status: DC
  Administered 2019-05-11: 1 via TOPICAL
  Filled 2019-05-11: qty 0.9

## 2019-05-11 NOTE — ED Triage Notes (Addendum)
Pt c/o bump/blister on back pf left heel for several days.

## 2019-05-11 NOTE — ED Provider Notes (Signed)
[facility] DEPT Provider Note   CSN: [id number] Arrival date & time: 05/11/19  1448     History   Chief Complaint Chief Complaint  Patient presents with  . bump on heel    HPI Collin White is a 23 y.o. adult.     Patient is a 23 year old male who identifies as male with past medical history of schizophrenia, seizures, borderline intellectual functioning who presents to the emergency department for "bump on the heel".  Patient reports about 5 days ago he noticed a bump on his posterior left heel.  Reports that he has been itching and scratching at it.  Unknown injury or insect bite.  Reports that it then began to burn slightly.  He wanted to get it looked at.  "I think I just need a Band-Aid".  He has not tried anything for relief.     Past Medical History:  Diagnosis Date  . Migraines   . Psychoses ([place])   . Schizophrenia ([place])   . Seizures [facility])     Patient Active Problem List   Diagnosis Date Noted  . Acute episode of schizophrenia with history of multiple episodes ([place]) 06/30/2017  . Schizophrenia, paranoid type ([place]) 06/08/2017  . 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 Psychoses ([place])   . Paranoid schizophrenia ([place]) 09/29/2015  . Cannabis use disorder, moderate, in early remission ([place]) 09/26/2015  . History of posttraumatic stress disorder (PTSD) 09/26/2015    Past Surgical History:  Procedure Laterality Date  . abd surgery s/p traumatic event    . facial reconstructive surgery    . NO PAST SURGERIES  patient stated he had facial reconstruction surgery as a child   . tumor removed from head           Home Medications    Prior to Admission medications   Medication Sig Start Date End Date Taking? Authorizing Provider  bacitracin ointment Apply 1 application topically 2 (two) times daily. 05/01/19   Domenic Moras, PA-C    Family History Family History  Problem Relation Age of Onset  . Hypertension Other    . Mental illness Neg Hx     Social History Social History   Tobacco Use  . Smoking status: Former Smoker    Types: Cigarettes  . Smokeless tobacco: Never Used  Substance Use Topics  . Alcohol use: No  . Drug use: No     Allergies   Patient has no known allergies.   Review of Systems Review of Systems  Constitutional: Negative for fever.  Genitourinary: Negative for dysuria.  Musculoskeletal: Negative for back pain and myalgias.  Skin: Positive for rash and wound. Negative for color change and pallor.  Allergic/Immunologic: Negative for immunocompromised state.  Neurological: Negative for dizziness.     Physical Exam Updated Vital Signs BP 117/79 (BP Location: Left Arm)   Pulse 81   Temp 98.4 F (36.9 C) (Oral)   Resp 18   SpO2 100%   Physical Exam Vitals signs and nursing note reviewed.  Constitutional:      Appearance: Normal appearance.  HENT:     Head: Normocephalic.  Eyes:     Conjunctiva/sclera: Conjunctivae normal.  Pulmonary:     Effort: Pulmonary effort is normal.  Skin:    General: Skin is dry.     Comments: Lateral posterior heel with a 0.4 mm papule with small ulceration in the center.  No surrounding erythema, no drainage, nontender to palpation.  No other lesions on the hands  or feet.  Neurological:     Mental Status: She is alert.  Psychiatric:        Mood and Affect: Mood normal.      ED Treatments / Results  Labs (all labs ordered are listed, but only abnormal results are displayed) Labs Reviewed - No data to display  EKG None  Radiology No results found.  Procedures Procedures (including critical care time)  Medications Ordered in ED Medications  bacitracin ointment (1 application Topical Given 05/11/19 1622)     Initial Impression / Assessment and Plan / ED Course  I have reviewed the triage vital signs and the nursing notes.  Pertinent labs & imaging results that were available during my care of the patient were  reviewed by me and considered in my medical decision making (see chart for details).  Clinical Course as of May 10 1720  Wed May 11, 2019  1605 Patient appears to have a possible insect bite to his posterior heel.  Does not appear to be infected.  Advised the patient on wound care and on strict return precautions.  Bacitracin was placed on the wound and a Band-Aid was placed on the wound.   [KM]    Clinical Course User Index [KM] Arlyn Dunning, PA-C       Based on review of vitals, medical screening exam, lab work and/or imaging, there does not appear to be an acute, emergent etiology for the patient's symptoms. Counseled pt on good return precautions and encouraged both PCP and ED follow-up as needed.  Prior to discharge, I also discussed incidental imaging findings with patient in detail and advised appropriate, recommended follow-up in detail.  Clinical Impression: 1. Rash and nonspecific skin eruption     Disposition: Discharge  Prior to providing a prescription for a controlled substance, I independently reviewed the patient's recent prescription history on the [place] Controlled Substance Reporting System. The patient had no recent or regular prescriptions and was deemed appropriate for a brief, less than 3 day prescription of narcotic for acute analgesia.  This note was prepared with assistance of Conservation officer, historic buildings. Occasional wrong-word or sound-a-like substitutions may have occurred due to the inherent limitations of voice recognition software.   Final Clinical Impressions(s) / ED Diagnoses   Final diagnoses:  Rash and nonspecific skin eruption    ED Discharge Orders    None       Jeral Pinch 05/11/19 1721    Jacalyn Lefevre, MD 05/11/19 1729

## 2019-05-11 NOTE — ED Notes (Signed)
Pt has an abrasion to left side of left heel, applied bacitracin, and applied bandaid

## 2019-05-11 NOTE — Discharge Instructions (Signed)
Return to the emergency department if you have increased pain, swelling or redness.  Clean the wound daily and apply bandage daily

## 2019-05-13 ENCOUNTER — Emergency Department (HOSPITAL_COMMUNITY)
Admission: EM | Admit: 2019-05-13 | Discharge: 2019-05-14 | Disposition: A | Discharge status: Home / Self Care | Payer: Self-pay | Attending: Emergency Medicine | Admitting: Emergency Medicine

## 2019-05-13 ENCOUNTER — Other Ambulatory Visit: Payer: Self-pay

## 2019-05-13 DIAGNOSIS — F209 Schizophrenia, unspecified: Secondary | ICD-10-CM | POA: Insufficient documentation

## 2019-05-13 DIAGNOSIS — M25519 Pain in unspecified shoulder: Secondary | ICD-10-CM | POA: Insufficient documentation

## 2019-05-13 DIAGNOSIS — Z139 Encounter for screening, unspecified: Secondary | ICD-10-CM

## 2019-05-13 DIAGNOSIS — F1910 Other psychoactive substance abuse, uncomplicated: Secondary | ICD-10-CM | POA: Insufficient documentation

## 2019-05-13 DIAGNOSIS — M79606 Pain in leg, unspecified: Secondary | ICD-10-CM | POA: Insufficient documentation

## 2019-05-13 DIAGNOSIS — Z87891 Personal history of nicotine dependence: Secondary | ICD-10-CM | POA: Insufficient documentation

## 2019-05-13 DIAGNOSIS — M5489 Other dorsalgia: Secondary | ICD-10-CM | POA: Insufficient documentation

## 2019-05-13 DIAGNOSIS — Z0489 Encounter for examination and observation for other specified reasons: Secondary | ICD-10-CM | POA: Insufficient documentation

## 2019-05-13 NOTE — ED Notes (Signed)
Tried to triage patient and he is not making any sense. Patient is not giving why he is at the emergency room today. Patient is eating [employer] without any difficulty.

## 2019-05-13 NOTE — ED Triage Notes (Signed)
Pt presents to the ED with multiple complaints. During triage pt was consuming french fries from [employer] while complaining of indigestion amongst many other things. Pt does not appear to be in any immediate distress at this time.

## 2019-05-14 ENCOUNTER — Other Ambulatory Visit: Payer: Self-pay

## 2019-05-14 MED ORDER — ACETAMINOPHEN 500 MG PO TABS
1000.0000 mg | ORAL_TABLET | Freq: Once | ORAL | Status: AC
  Administered 2019-05-14: 01:00:00 1000 mg via ORAL
  Filled 2019-05-14: qty 2

## 2019-05-14 NOTE — ED Provider Notes (Signed)
[facility]-EMERGENCY DEPT Provider Note   CSN: [id number] Arrival date & time: 05/13/19  2102     History   Chief Complaint Chief Complaint  Patient presents with  . Gastroesophageal Reflux  . Leg Pain  . Back Pain  . Shoulder Pain    HPI Aztlan Coll is a 23 y.o. adult.     The history is provided by the patient and medical records.  Gastroesophageal Reflux  Leg Pain Back Pain Associated symptoms: leg pain   Shoulder Pain    23 year old male with history of migraine headaches, psychoses, schizophrenia, polysubstance abuse, presenting to the ED for unclear reasons.  Triage note reports some issues with indigestion, however patient denies this to me.  He states the other day he had a runny nose but that is better now.  He also states when he walks long distances he gets some pain in his right armpit and is not sure why.  He would like something for pain.  He denies any chest pain or shortness of breath.  No cough or fever.  Patient has been eating [employer] in triage room without issue.  Past Medical History:  Diagnosis Date  . Migraines   . Psychoses (HCC)   . Schizophrenia (HCC)   . Seizures [facility])     Patient Active Problem List   Diagnosis Date Noted  . Acute episode of schizophrenia with history of multiple episodes (HCC) 06/30/2017  . Schizophrenia, paranoid type (HCC) 06/08/2017  . 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 Psychoses (HCC)   . Paranoid schizophrenia (HCC) 09/29/2015  . Cannabis use disorder, moderate, in early remission (HCC) 09/26/2015  . History of posttraumatic stress disorder (PTSD) 09/26/2015    Past Surgical History:  Procedure Laterality Date  . abd surgery s/p traumatic event    . facial reconstructive surgery    . NO PAST SURGERIES  patient stated he had facial reconstruction surgery as a child   . tumor removed from head           Home Medications    Prior to Admission medications    Medication Sig Start Date End Date Taking? Authorizing Provider  bacitracin ointment Apply 1 application topically 2 (two) times daily. 05/01/19   Fayrene Helper, PA-C    Family History Family History  Problem Relation Age of Onset  . Hypertension Other   . Mental illness Neg Hx     Social History Social History   Tobacco Use  . Smoking status: Former Smoker    Types: Cigarettes  . Smokeless tobacco: Never Used  Substance Use Topics  . Alcohol use: No  . Drug use: No     Allergies   Patient has no known allergies.   Review of Systems Review of Systems  Musculoskeletal: Positive for arthralgias.  All other systems reviewed and are negative.    Physical Exam Updated Vital Signs BP (!) 126/91 (BP Location: Left Arm)   Pulse 85   Temp 99.1 F (37.3 C) (Oral)   Resp 18   Ht 5\' 9"  (1.753 m)   Wt 63.5 kg   SpO2 100%   BMI 20.67 kg/m   Physical Exam Vitals signs and nursing note reviewed.  Constitutional:      Appearance: She is well-developed.     Comments: Disheveled appearing, clothing is dirty  HENT:     Head: Normocephalic and atraumatic.  Eyes:     Conjunctiva/sclera: Conjunctivae normal.     Pupils: Pupils are equal, round, and  reactive to light.  Neck:     Musculoskeletal: Normal range of motion.  Cardiovascular:     Rate and Rhythm: Normal rate and regular rhythm.     Heart sounds: Normal heart sounds.  Pulmonary:     Effort: Pulmonary effort is normal.     Breath sounds: Normal breath sounds.  Abdominal:     General: Bowel sounds are normal.     Palpations: Abdomen is soft.  Musculoskeletal: Normal range of motion.  Skin:    General: Skin is warm and dry.  Neurological:     Mental Status: She is alert and oriented to person, place, and time.  Psychiatric:     Comments: Odd affect but does not appear psychotic or responding to internal stimuli      ED Treatments / Results  Labs (all labs ordered are listed, but only abnormal results are  displayed) Labs Reviewed - No data to display  EKG None  Radiology No results found.  Procedures Procedures (including critical care time)  Medications Ordered in ED Medications  acetaminophen (TYLENOL) tablet 1,000 mg (has no administration in time range)     Initial Impression / Assessment and Plan / ED Course  I have reviewed the triage vital signs and the nursing notes.  Pertinent labs & imaging results that were available during my care of the patient were reviewed by me and considered in my medical decision making (see chart for details).  23 year old male here for unclear origin.  Patient with multiple prior visits in the past for minor related complaints.  Triage note reports indigestion, however patient denies this to me.  Reports he had a runny nose a few days ago but has not resolved.  States sometimes when he walks long distances he has pain in the right axilla.  He is in no acute distress, has been eating [employer] in triage room without issue.  He is disheveled with an odd affect but does not appear to be responding to internal stimuli or psychotic.  He requested something for his arm pain so was given Tylenol.  Does not appear to need further work-up or evaluation here in the ED.  He will be discharged.  Final Clinical Impressions(s) / ED Diagnoses   Final diagnoses:  Encounter for medical screening examination    ED Discharge Orders    None       Larene Pickett, PA-C 75/64/33 [id number]    Delora Fuel, MD 88/41/66 [phone]

## 2019-05-15 ENCOUNTER — Emergency Department (HOSPITAL_COMMUNITY)
Admission: EM | Admit: 2019-05-15 | Discharge: 2019-05-15 | Disposition: A | Discharge status: Home / Self Care | Payer: Self-pay | Attending: Emergency Medicine | Admitting: Emergency Medicine

## 2019-05-15 ENCOUNTER — Encounter (HOSPITAL_COMMUNITY): Payer: Self-pay | Admitting: *Deleted

## 2019-05-15 ENCOUNTER — Other Ambulatory Visit: Payer: Self-pay

## 2019-05-15 DIAGNOSIS — M7918 Myalgia, other site: Secondary | ICD-10-CM

## 2019-05-15 DIAGNOSIS — R0789 Other chest pain: Secondary | ICD-10-CM | POA: Insufficient documentation

## 2019-05-15 DIAGNOSIS — Z87891 Personal history of nicotine dependence: Secondary | ICD-10-CM | POA: Insufficient documentation

## 2019-05-15 DIAGNOSIS — Z590 Homelessness: Secondary | ICD-10-CM | POA: Insufficient documentation

## 2019-05-15 DIAGNOSIS — M542 Cervicalgia: Secondary | ICD-10-CM | POA: Insufficient documentation

## 2019-05-15 MED ORDER — ACETAMINOPHEN 500 MG PO TABS
1000.0000 mg | ORAL_TABLET | Freq: Once | ORAL | Status: AC
  Administered 2019-05-15: 1000 mg via ORAL
  Filled 2019-05-15: qty 2

## 2019-05-15 NOTE — ED Provider Notes (Signed)
[facility]PT Provider Note   CSN: [id number] Arrival date & time: 05/15/19  0050     History   Chief Complaint Chief Complaint  Patient presents with  . Neck Pain    HPI Jaquari Reckner is a 23 y.o. adult.     23 year old with history of migraine headaches, psychoses, schizophrenia, polysubstance abuse presents for complaints of anterior neck pain and chest pain that is aching and constant, onset 2 hours PTA. No medications taken PTA.   The history is provided by the patient. No language interpreter was used.  Muscle Pain This is a new problem. The current episode started 3 to 5 hours ago. The problem occurs constantly. The problem has not changed since onset.Associated symptoms comments: Chest wall pain, anterior neck pain. Nothing relieves the symptoms. She has tried nothing for the symptoms. The treatment provided no relief.    Past Medical History:  Diagnosis Date  . Migraines   . Psychoses (HCC)   . Schizophrenia (HCC)   . Seizures [facility])     Patient Active Problem List   Diagnosis Date Noted  . Acute episode of schizophrenia with history of multiple episodes (HCC) 06/30/2017  . Schizophrenia, paranoid type (HCC) 06/08/2017  . 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 Psychoses (HCC)   . Paranoid schizophrenia (HCC) 09/29/2015  . Cannabis use disorder, moderate, in early remission (HCC) 09/26/2015  . History of posttraumatic stress disorder (PTSD) 09/26/2015    Past Surgical History:  Procedure Laterality Date  . abd surgery s/p traumatic event    . facial reconstructive surgery    . NO PAST SURGERIES  patient stated he had facial reconstruction surgery as a child   . tumor removed from head           Home Medications    Prior to Admission medications   Medication Sig Start Date End Date Taking? Authorizing Provider  bacitracin ointment Apply 1 application topically 2 (two) times daily. 05/01/19   Fayrene Helper,  PA-C    Family History Family History  Problem Relation Age of Onset  . Hypertension Other   . Mental illness Neg Hx     Social History Social History   Tobacco Use  . Smoking status: Former Smoker    Types: Cigarettes  . Smokeless tobacco: Never Used  Substance Use Topics  . Alcohol use: No  . Drug use: No     Allergies   Patient has no known allergies.   Review of Systems Review of SystemsTen systems reviewed and are negative for acute change, except as noted in the HPI.    Physical Exam Updated Vital Signs BP 126/67 (BP Location: Left Arm)   Pulse 67   Temp 98.9 F (37.2 C) (Oral)   Resp 16   SpO2 100%   Physical Exam Vitals signs and nursing note reviewed.  Constitutional:      General: She is not in acute distress.    Appearance: She is well-developed. She is not diaphoretic.     Comments: Disheveled appearing, nontoxic  HENT:     Head: Normocephalic and atraumatic.     Mouth/Throat:     Mouth: Mucous membranes are moist.     Comments: Clear posterior oropharynx.  Normal tongue protrusion.  No posterior oropharyngeal edema or erythema.  No exudates.  Tolerating secretions without difficulty.  Normal phonation. Eyes:     General: No scleral icterus.    Conjunctiva/sclera: Conjunctivae normal.  Neck:     Musculoskeletal: Normal  range of motion.  Cardiovascular:     Rate and Rhythm: Normal rate and regular rhythm.     Pulses: Normal pulses.  Pulmonary:     Effort: Pulmonary effort is normal. No respiratory distress.     Comments: Lungs clear to auscultation bilaterally.  Respirations even and unlabored.  No crepitus on palpation to the chest wall. Chest:     Chest wall: Tenderness (diffusely to chest wall) present.  Musculoskeletal: Normal range of motion.  Skin:    General: Skin is warm and dry.     Coloration: Skin is not pale.     Findings: No erythema or rash.  Neurological:     General: No focal deficit present.     Mental Status: She is  alert and oriented to person, place, and time.     Coordination: Coordination normal.  Psychiatric:        Behavior: Behavior normal.      ED Treatments / Results  Labs (all labs ordered are listed, but only abnormal results are displayed) Labs Reviewed - No data to display  EKG None  Radiology No results found.  Procedures Procedures (including critical care time)  Medications Ordered in ED Medications  acetaminophen (TYLENOL) tablet 1,000 mg (has no administration in time range)     Initial Impression / Assessment and Plan / ED Course  I have reviewed the triage vital signs and the nursing notes.  Pertinent labs & imaging results that were available during my care of the patient were reviewed by me and considered in my medical decision making (see chart for details).        23 year old presents to the emergency department for evaluation of anterior neck pain and chest wall pain.  Suspect malingering as patient is homeless.  He is reproducible pain on palpation to the chest wall without crepitus.  Lungs clear and vital stable.  No hypoxia.  He is tolerating his secretions without difficulty.  No meningismus on exam.  Given Tylenol prior to discharge for pain control.  Instructed to follow-up with a primary care doctor.  Patient discharged from the department in stable condition.   Final Clinical Impressions(s) / ED Diagnoses   Final diagnoses:  Musculoskeletal pain    ED Discharge Orders    None       Antonietta Breach, PA-C 05/15/19 0416    Palumbo, April, MD 05/15/19 [phone]

## 2019-05-15 NOTE — ED Notes (Signed)
INITIAL ASSESSMENT COMPLETED. AWAITING FURTHER ORDERS. 

## 2019-05-15 NOTE — Discharge Instructions (Signed)
Take Tylenol or ibuprofen for management of persistent symptoms.  Follow-up with a primary care doctor.

## 2019-05-15 NOTE — ED Notes (Signed)
PT DISCHARGED. INSTRUCTIONS GIVEN. AAOX4. PT IN NO APPARENT DISTRESS WITH MODERATE PAIN. THE OPPORTUNITY TO ASK QUESTIONS WAS PROVIDED. 

## 2019-05-15 NOTE — ED Triage Notes (Signed)
Anterior neck pain since tonight. Denies injury

## 2019-05-17 ENCOUNTER — Encounter (HOSPITAL_COMMUNITY): Payer: Self-pay | Admitting: Emergency Medicine

## 2019-05-17 ENCOUNTER — Emergency Department (HOSPITAL_COMMUNITY)
Admission: EM | Admit: 2019-05-17 | Discharge: 2019-05-17 | Disposition: A | Discharge status: Home / Self Care | Payer: Self-pay | Attending: Emergency Medicine | Admitting: Emergency Medicine

## 2019-05-17 DIAGNOSIS — M791 Myalgia, unspecified site: Secondary | ICD-10-CM | POA: Insufficient documentation

## 2019-05-17 DIAGNOSIS — R079 Chest pain, unspecified: Secondary | ICD-10-CM | POA: Insufficient documentation

## 2019-05-17 DIAGNOSIS — Z87891 Personal history of nicotine dependence: Secondary | ICD-10-CM | POA: Insufficient documentation

## 2019-05-17 DIAGNOSIS — Z5900 Homelessness unspecified: Secondary | ICD-10-CM

## 2019-05-17 DIAGNOSIS — Z765 Malingerer [conscious simulation]: Secondary | ICD-10-CM | POA: Insufficient documentation

## 2019-05-17 DIAGNOSIS — Z79899 Other long term (current) drug therapy: Secondary | ICD-10-CM | POA: Insufficient documentation

## 2019-05-17 DIAGNOSIS — R52 Pain, unspecified: Secondary | ICD-10-CM

## 2019-05-17 MED ORDER — ACETAMINOPHEN 325 MG PO TABS
650.0000 mg | ORAL_TABLET | Freq: Once | ORAL | Status: AC
  Administered 2019-05-17: 09:00:00 650 mg via ORAL
  Filled 2019-05-17: qty 2

## 2019-05-17 NOTE — ED Triage Notes (Signed)
Patient here from someone's porch. Reporting that he is transitioning into his new body and he didn't chew his food well when he ate "a bunch of crackers".

## 2019-05-17 NOTE — ED Provider Notes (Signed)
[facility]-EMER[facility] DEPT Provider Note   CSN: [id number] Arrival date & time: 05/17/19  6283     History   Chief Complaint Chief Complaint  Patient presents with  . Homeless    HPI Collin White is a 23 y.o. adult presenting for evaluation of body pain.   Pt states he had 3 shoort episodes of sharp pain in his chest, and since then has been having generalized body aches.  Pain is mostly in his chest and his back.  He has not taken anything for this.  Nothing makes it better or worse.  He denies fevers, chills, cough, nausea, vomiting.  History is difficult due to patient's mental health.  I have seen this patient before, and his mental status is at baseline.  Additional history obtained from triage note.  EMS states patient was brought here after [employer] was called for patient sleeping on 7 sports. Additional history obtained from chart review.  Patient seen 2 days ago for generalized body aches as well.     HPI  Past Medical History:  Diagnosis Date  . Migraines   . Psychoses (HCC)   . Schizophrenia (HCC)   . Seizures [facility])     Patient Active Problem List   Diagnosis Date Noted  . Acute episode of schizophrenia with history of multiple episodes (HCC) 06/30/2017  . Schizophrenia, paranoid type (HCC) 06/08/2017  . 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 Psychoses (HCC)   . Paranoid schizophrenia (HCC) 09/29/2015  . Cannabis use disorder, moderate, in early remission (HCC) 09/26/2015  . History of posttraumatic stress disorder (PTSD) 09/26/2015    Past Surgical History:  Procedure Laterality Date  . abd surgery s/p traumatic event    . facial reconstructive surgery    . NO PAST SURGERIES  patient stated he had facial reconstruction surgery as a child   . tumor removed from head           Home Medications    Prior to Admission medications   Medication Sig Start Date End Date Taking? Authorizing Provider  bacitracin ointment Apply  1 application topically 2 (two) times daily. 05/01/19   Fayrene Helper, PA-C    Family History Family History  Problem Relation Age of Onset  . Hypertension Other   . Mental illness Neg Hx     Social History Social History   Tobacco Use  . Smoking status: Former Smoker    Types: Cigarettes  . Smokeless tobacco: Never Used  Substance Use Topics  . Alcohol use: No  . Drug use: No     Allergies   Patient has no known allergies.   Review of Systems Review of Systems  Cardiovascular: Positive for chest pain.  Musculoskeletal: Positive for myalgias.     Physical Exam Updated Vital Signs BP (!) 121/92 (BP Location: Left Arm)   Pulse 62   Temp 98.6 F (37 C) (Oral)   Resp 18   SpO2 100%   Physical Exam Vitals signs and nursing note reviewed.  Constitutional:      General: She is not in acute distress.    Appearance: She is well-developed.     Comments: Sitting comfortably in the chair no acute distress  HENT:     Head: Normocephalic and atraumatic.  Neck:     Musculoskeletal: Normal range of motion.  Cardiovascular:     Rate and Rhythm: Normal rate and regular rhythm.     Pulses: Normal pulses.  Pulmonary:     Effort: Pulmonary  effort is normal.     Breath sounds: Normal breath sounds.     Comments: Speaking in full sentences.  Clear lung sounds in all fields.  Mild discomfort with palpation of the anterior chest wall. Abdominal:     General: There is no distension.     Palpations: There is no mass.     Tenderness: There is no abdominal tenderness. There is no guarding or rebound.     Comments: No tenderness palpation the abdomen  Musculoskeletal: Normal range of motion.     Comments: Strength and sensation intact.  Ambulatory without difficulty.  No tenderness to palpation of back  Skin:    General: Skin is warm.     Capillary Refill: Capillary refill takes less than 2 seconds.     Findings: No rash.  Neurological:     Mental Status: She is alert.   Psychiatric:     Comments: Tangential thoughts per baseline.      ED Treatments / Results  Labs (all labs ordered are listed, but only abnormal results are displayed) Labs Reviewed - No data to display  EKG EKG Interpretation  Date/Time:  Tuesday May 17 2019 09:23:47 EDT Ventricular Rate:  62 PR Interval:    QRS Duration: 98 QT Interval:  381 QTC Calculation: 387 R Axis:   81 Text Interpretation:  Sinus rhythm ST elev, probable normal early repol pattern Baseline wander in lead(s) V6 No significant change since last tracing Confirmed by Deno Etienne [phone]) on 05/17/2019 9:31:11 AM   Radiology No results found.  Procedures Procedures (including critical care time)  Medications Ordered in ED Medications  acetaminophen (TYLENOL) tablet 650 mg (650 mg Oral Given 05/17/19 0913)     Initial Impression / Assessment and Plan / ED Course  I have reviewed the triage vital signs and the nursing notes.  Pertinent labs & imaging results that were available during my care of the patient were reviewed by me and considered in my medical decision making (see chart for details).        Patient presenting for evaluation of generalized body aches.  Patient also reporting 3 discrete episodes sharp chest pain which have completely resolved. However, history is difficult due to her mental health.  Patient appears nontoxic.  Mild discomfort with palpation of the chest wall.  Likely spasm/MSK pain.  Low suspicion for ACS.  Will obtain EKG to ensure no obvious abnormality.  Tylenol for pain.  EKG without STEMI, unchanged from previous.  On reassessment, patient reports improved symptoms with Tylenol.  Doubt ACS.  I have a strong suspicion that homelessness is a significant reason for patient's visit to the ED.  At this time, patient appears safe for discharge.  Return precautions given.  Patient states he understands and agrees to plan.  Final Clinical Impressions(s) / ED Diagnoses    Final diagnoses:  Homeless  Generalized body aches    ED Discharge Orders    None       Franchot Heidelberg, PA-C 05/17/19 [place], DO 05/17/19 1513

## 2019-05-17 NOTE — Discharge Instructions (Addendum)
Take tylenol and ibuprofen as needed for pain.  Follow up with the [facility] for re-evaluation.  Return to the ER with any new, worsening, or concerning symptoms.

## 2019-05-17 NOTE — ED Triage Notes (Signed)
Patient was found sleeping on someone's porch, [employer] called. EMS brought patient here. Patient complains of pain all over.

## 2019-05-18 ENCOUNTER — Emergency Department (HOSPITAL_COMMUNITY)
Admission: EM | Admit: 2019-05-18 | Discharge: 2019-05-18 | Disposition: A | Discharge status: Home / Self Care | Payer: Self-pay | Attending: Emergency Medicine | Admitting: Emergency Medicine

## 2019-05-18 DIAGNOSIS — Z765 Malingerer [conscious simulation]: Secondary | ICD-10-CM

## 2019-05-18 MED ORDER — ALUM & MAG HYDROXIDE-SIMETH 200-200-20 MG/5ML PO SUSP
15.0000 mL | Freq: Once | ORAL | Status: AC
  Administered 2019-05-18: 15 mL via ORAL
  Filled 2019-05-18: qty 30

## 2019-05-18 NOTE — ED Provider Notes (Signed)
[facility]-EMERGENCY DEPT Provider Note   CSN: [id number] Arrival date & time: 05/17/19  2242     History   Chief Complaint Chief Complaint  Patient presents with  . Homeless    HPI Samrat Hayward is a 23 y.o. adult.     Patient presents to the emergency department complaining of inability to chew his food well while eating cheese its.  He has since been able to eat an orange without difficulty.  States that the pain in his throat goes down into his chest and that "the #1 cause of death is inability to eat".  Was transported by EMS from "someone's porch". Seen <12 hours ago in the ED for c/o chest pain.  Hx of homelessness, schizophrenia, migraines.     Past Medical History:  Diagnosis Date  . Migraines   . Psychoses (HCC)   . Schizophrenia (HCC)   . Seizures [facility])     Patient Active Problem List   Diagnosis Date Noted  . Acute episode of schizophrenia with history of multiple episodes (HCC) 06/30/2017  . Schizophrenia, paranoid type (HCC) 06/08/2017  . 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 Psychoses (HCC)   . Paranoid schizophrenia (HCC) 09/29/2015  . Cannabis use disorder, moderate, in early remission (HCC) 09/26/2015  . History of posttraumatic stress disorder (PTSD) 09/26/2015    Past Surgical History:  Procedure Laterality Date  . abd surgery s/p traumatic event    . facial reconstructive surgery    . NO PAST SURGERIES  patient stated he had facial reconstruction surgery as a child   . tumor removed from head           Home Medications    Prior to Admission medications   Medication Sig Start Date End Date Taking? Authorizing Provider  bacitracin ointment Apply 1 application topically 2 (two) times daily. 05/01/19   Fayrene Helper, PA-C    Family History Family History  Problem Relation Age of Onset  . Hypertension Other   . Mental illness Neg Hx     Social History Social History   Tobacco Use  . Smoking status:  Former Smoker    Types: Cigarettes  . Smokeless tobacco: Never Used  Substance Use Topics  . Alcohol use: No  . Drug use: No     Allergies   Patient has no known allergies.   Review of Systems Review of Systems Ten systems reviewed and are negative for acute change, except as noted in the HPI.    Physical Exam Updated Vital Signs BP 126/76 (BP Location: Left Arm)   Pulse 68   Temp 98.4 F (36.9 C) (Oral)   Resp 18   Ht 6\' 1"  (1.854 m)   SpO2 100%   BMI 18.47 kg/m   Physical Exam Vitals signs and nursing note reviewed.  Constitutional:      General: She is not in acute distress.    Appearance: She is well-developed. She is not diaphoretic.     Comments: Wearing same clothes from 3 days ago. Alert and nontoxic.  HENT:     Head: Normocephalic and atraumatic.     Mouth/Throat:     Comments: Tolerating secretions without difficulty. Normal phonation. Eyes:     General: No scleral icterus.    Conjunctiva/sclera: Conjunctivae normal.  Neck:     Musculoskeletal: Normal range of motion.  Pulmonary:     Effort: Pulmonary effort is normal. No respiratory distress.     Comments: Respirations even and unlabored Musculoskeletal: Normal  range of motion.  Skin:    General: Skin is warm and dry.     Coloration: Skin is not pale.     Findings: No erythema or rash.  Neurological:     Mental Status: She is alert. Mental status is at baseline.  Psychiatric:        Speech: Speech is tangential.        Behavior: Behavior is cooperative.      ED Treatments / Results  Labs (all labs ordered are listed, but only abnormal results are displayed) Labs Reviewed - No data to display  EKG None  Radiology No results found.  Procedures Procedures (including critical care time)  Medications Ordered in ED Medications  alum & mag hydroxide-simeth (MAALOX/MYLANTA) 200-200-20 MG/5ML suspension 15 mL (has no administration in time range)     Initial Impression / Assessment  and Plan / ED Course  I have reviewed the triage vital signs and the nursing notes.  Pertinent labs & imaging results that were available during my care of the patient were reviewed by me and considered in my medical decision making (see chart for details).        Patient presents to the emergency department for difficulty swallowing, though he has no difficulty tolerating secretions and has eaten an orange since arrival.  Given Maalox for c/o pain with swallowing.  Is in no acute distress with stable, reassuring vital signs.  Was seen less than 12 hours ago in the ED for various complaints.  Known history of homelessness.  Suspect malingering.  Patient is currently at baseline and appropriate for discharge.   Final Clinical Impressions(s) / ED Diagnoses   Final diagnoses:  Judsonia    ED Discharge Orders    None       Antonietta Breach, PA-C 05/18/19 0150    Orpah Greek, MD 05/18/19 [phone]

## 2019-05-19 DIAGNOSIS — Z87891 Personal history of nicotine dependence: Secondary | ICD-10-CM | POA: Insufficient documentation

## 2019-05-19 DIAGNOSIS — M791 Myalgia, unspecified site: Secondary | ICD-10-CM | POA: Insufficient documentation

## 2019-05-20 ENCOUNTER — Other Ambulatory Visit: Payer: Self-pay

## 2019-05-20 ENCOUNTER — Emergency Department (HOSPITAL_COMMUNITY)
Admission: EM | Admit: 2019-05-20 | Discharge: 2019-05-20 | Disposition: A | Discharge status: Home / Self Care | Payer: Self-pay | Attending: Emergency Medicine | Admitting: Emergency Medicine

## 2019-05-20 ENCOUNTER — Encounter (HOSPITAL_COMMUNITY): Payer: Self-pay

## 2019-05-20 DIAGNOSIS — R52 Pain, unspecified: Secondary | ICD-10-CM

## 2019-05-20 DIAGNOSIS — Z5321 Procedure and treatment not carried out due to patient leaving prior to being seen by health care provider: Secondary | ICD-10-CM | POA: Insufficient documentation

## 2019-05-20 NOTE — Discharge Instructions (Addendum)
Return to the ER for chest pain, difficulty breathing, fever, cough.

## 2019-05-20 NOTE — ED Triage Notes (Signed)
Pt states that he has "iniquis." He states that that means that he has a fever, but his fevers don't register here. Pt behavior seems to be his norm.

## 2019-05-20 NOTE — ED Provider Notes (Signed)
[facility] DEPT Provider Note   CSN: [id number] Arrival date & time: 05/19/19  2341     History   Chief Complaint No chief complaint on file.   HPI Kelon Easom is a 23 y.o. adult.     Patient presents the emergency department with a chief complaint of body pain.  He was found sleeping on someone's porch, [employer] was called, and subsequently EMS brought the patient to the hospital for evaluation.  Patient is well-known to the emergency department.  He states that he has very sensitive bones.  He denies any treatments prior to arrival.  States that he has some soreness in his back.  Denies any other new symptoms.  The history is provided by the patient. No language interpreter was used.    Past Medical History:  Diagnosis Date  . Migraines   . Psychoses ([place])   . Schizophrenia ([place])   . Seizures [facility])     Patient Active Problem List   Diagnosis Date Noted  . Acute episode of schizophrenia with history of multiple episodes ([place]) 06/30/2017  . Schizophrenia, paranoid type ([place]) 06/08/2017  . 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 Psychoses ([place])   . Paranoid schizophrenia ([place]) 09/29/2015  . Cannabis use disorder, moderate, in early remission ([place]) 09/26/2015  . History of posttraumatic stress disorder (PTSD) 09/26/2015    Past Surgical History:  Procedure Laterality Date  . abd surgery s/p traumatic event    . facial reconstructive surgery    . NO PAST SURGERIES  patient stated he had facial reconstruction surgery as a child   . tumor removed from head           Home Medications    Prior to Admission medications   Medication Sig Start Date End Date Taking? Authorizing Provider  bacitracin ointment Apply 1 application topically 2 (two) times daily. 05/01/19   Domenic Moras, PA-C    Family History Family History  Problem Relation Age of Onset  . Hypertension Other   . Mental illness Neg Hx     Social History Social  History   Tobacco Use  . Smoking status: Former Smoker    Types: Cigarettes  . Smokeless tobacco: Never Used  Substance Use Topics  . Alcohol use: No  . Drug use: No     Allergies   Patient has no known allergies.   Review of Systems Review of Systems  All other systems reviewed and are negative.    Physical Exam Updated Vital Signs BP 110/73 (BP Location: Right Arm)   Pulse 73   Temp 97.8 F (36.6 C) (Oral)   Resp 16   SpO2 99%   Physical Exam Vitals signs and nursing note reviewed.  Constitutional:      Appearance: She is well-developed.  HENT:     Head: Normocephalic and atraumatic.  Eyes:     Conjunctiva/sclera: Conjunctivae normal.  Neck:     Musculoskeletal: Neck supple.  Cardiovascular:     Rate and Rhythm: Normal rate and regular rhythm.     Heart sounds: No murmur.  Pulmonary:     Effort: Pulmonary effort is normal. No respiratory distress.     Breath sounds: Normal breath sounds.  Abdominal:     Palpations: Abdomen is soft.     Tenderness: There is no abdominal tenderness.  Musculoskeletal: Normal range of motion.     Comments: Range of motion strength of upper and lower extremities is normal  Skin:    General: Skin  is warm and dry.  Neurological:     Mental Status: She is alert and oriented to person, place, and time.  Psychiatric:     Comments: Baseline for patient      ED Treatments / Results  Labs (all labs ordered are listed, but only abnormal results are displayed) Labs Reviewed - No data to display  EKG None  Radiology No results found.  Procedures Procedures (including critical care time)  Medications Ordered in ED Medications - No data to display   Initial Impression / Assessment and Plan / ED Course  I have reviewed the triage vital signs and the nursing notes.  Pertinent labs & imaging results that were available during my care of the patient were reviewed by me and considered in my medical decision making (see  chart for details).       Patient here complaining of body pain.  He was found sleeping on porch.  Consider stiff muscles.  Vital signs are stable.  Patient is in no acute distress.  No further emergent work-up indicated.  Final Clinical Impressions(s) / ED Diagnoses   Final diagnoses:  Total body pain    ED Discharge Orders    None       Roxy Horseman, PA-C 05/20/19 0316    Ward, Layla Maw, DO 05/20/19 [phone]

## 2019-05-21 ENCOUNTER — Emergency Department (HOSPITAL_COMMUNITY)
Admission: EM | Admit: 2019-05-21 | Discharge: 2019-05-21 | Disposition: A | Discharge status: Home / Self Care | Payer: Self-pay | Attending: Emergency Medicine | Admitting: Emergency Medicine

## 2019-05-21 NOTE — ED Notes (Signed)
Called for vitals recheck, no answer.  

## 2019-11-22 ENCOUNTER — Emergency Department (HOSPITAL_COMMUNITY)
Admission: EM | Admit: 2019-11-22 | Discharge: 2019-11-22 | Disposition: A | Discharge status: Home / Self Care | Payer: Self-pay | Attending: Emergency Medicine | Admitting: Emergency Medicine

## 2019-11-22 ENCOUNTER — Emergency Department (HOSPITAL_COMMUNITY): Payer: Self-pay

## 2019-11-22 DIAGNOSIS — Z87891 Personal history of nicotine dependence: Secondary | ICD-10-CM | POA: Insufficient documentation

## 2019-11-22 DIAGNOSIS — R1084 Generalized abdominal pain: Secondary | ICD-10-CM | POA: Insufficient documentation

## 2019-11-22 DIAGNOSIS — Z79899 Other long term (current) drug therapy: Secondary | ICD-10-CM | POA: Insufficient documentation

## 2019-11-22 DIAGNOSIS — R059 Cough, unspecified: Secondary | ICD-10-CM

## 2019-11-22 DIAGNOSIS — R05 Cough: Secondary | ICD-10-CM | POA: Insufficient documentation

## 2019-11-22 LAB — CBC WITH DIFFERENTIAL/PLATELET
Abs Immature Granulocytes: 0.02 10*3/uL (ref 0.00–0.07)
Basophils Absolute: 0 10*3/uL (ref 0.0–0.1)
Basophils Relative: 0 %
Eosinophils Absolute: 0 10*3/uL (ref 0.0–0.5)
Eosinophils Relative: 0 %
HCT: 39.3 % (ref 39.0–52.0)
Hemoglobin: 12.9 g/dL — ABNORMAL LOW (ref 13.0–17.0)
Immature Granulocytes: 0 %
Lymphocytes Relative: 16 %
Lymphs Abs: 1.3 10*3/uL (ref 0.7–4.0)
MCH: 29.1 pg (ref 26.0–34.0)
MCHC: 32.8 g/dL (ref 30.0–36.0)
MCV: 88.7 fL (ref 80.0–100.0)
Monocytes Absolute: 0.5 10*3/uL (ref 0.1–1.0)
Monocytes Relative: 6 %
Neutro Abs: 6.3 10*3/uL (ref 1.7–7.7)
Neutrophils Relative %: 78 %
Platelets: 151 10*3/uL (ref 150–400)
RBC: 4.43 MIL/uL (ref 4.22–5.81)
RDW: 12.6 % (ref 11.5–15.5)
WBC: 8.2 10*3/uL (ref 4.0–10.5)
nRBC: 0 % (ref 0.0–0.2)

## 2019-11-22 LAB — COMPREHENSIVE METABOLIC PANEL
ALT: 11 U/L (ref 0–44)
AST: 20 U/L (ref 15–41)
Albumin: 4.2 g/dL (ref 3.5–5.0)
Alkaline Phosphatase: 65 U/L (ref 38–126)
Anion gap: 9 (ref 5–15)
BUN: 17 mg/dL (ref 6–20)
CO2: 25 mmol/L (ref 22–32)
Calcium: 9.3 mg/dL (ref 8.9–10.3)
Chloride: 108 mmol/L (ref 98–111)
Creatinine, Ser: 0.98 mg/dL (ref 0.61–1.24)
GFR calc Af Amer: >60 mL/min (ref >60)
GFR calc non Af Amer: >60 mL/min (ref >60)
Glucose, Bld: 87 mg/dL (ref 70–99)
Potassium: 4 mmol/L (ref 3.5–5.1)
Sodium: 142 mmol/L (ref 135–145)
Total Bilirubin: 0.5 mg/dL (ref 0.3–1.2)
Total Protein: 7.1 g/dL (ref 6.5–8.1)

## 2019-11-22 LAB — LIPASE, BLOOD: Lipase: 26 U/L (ref 11–51)

## 2019-11-22 MED ORDER — ONDANSETRON HCL 4 MG PO TABS: 4.0000 mg | ORAL_TABLET | Freq: Three times a day (TID) | ORAL | 0 refills | Status: DC | PRN

## 2019-11-22 MED ORDER — ONDANSETRON HCL 4 MG/2ML IJ SOLN
4.0000 mg | Freq: Once | INTRAMUSCULAR | Status: AC
  Administered 2019-11-22: 4 mg via INTRAVENOUS
  Filled 2019-11-22: qty 2

## 2019-11-22 NOTE — ED Notes (Signed)
Patient provided with crackers, peanut butter, cheese, and gingerale per request.

## 2019-11-22 NOTE — Discharge Instructions (Signed)
You were seen in the emergency department for cough abdominal pain and nausea.  You had blood work and a chest x-ray that did not show any serious findings.  You were given some nausea medicine with improvement in your symptoms.  Please drink a clear liquid diet and advance diet as tolerated.  Return to the emergency department for any worsening or concerning symptoms

## 2019-11-22 NOTE — ED Triage Notes (Signed)
Arrived on foot. Patient reports right sided abdominal pain and "snarzing". Patient states snarzing is "like when you have to vomit, but nothing is coming out". NAD

## 2019-11-22 NOTE — ED Provider Notes (Signed)
[facility]-EMER[facility] DEPT Provider Note   CSN: [id number] Arrival date & time: 11/22/19  1738     History Chief Complaint  Patient presents with  . Abdominal Pain  . Nausea    Collin White is a 24 y.o. adult.  She has a history of psychiatric disorder and is homeless.  Complaining of nausea and retching and upper abdominal pain.  Coughing here also.  No known fever.  Very poor historian.  Started yesterday and worse today.  The history is provided by the patient.  Abdominal Pain Pain location:  Epigastric Pain quality: cramping   Pain radiates to:  Does not radiate Pain severity:  Unable to specify Onset quality:  Gradual Duration:  24 hours Timing:  Intermittent Progression:  Unchanged Chronicity:  New Context: not trauma   Relieved by:  None tried Worsened by:  Nothing Ineffective treatments:  None tried Associated symptoms: cough and nausea   Associated symptoms: no chest pain, no diarrhea, no dysuria, no fever, no shortness of breath and no sore throat        Past Medical History:  Diagnosis Date  . Migraines   . Psychoses (HCC)   . Schizophrenia (HCC)   . Seizures [facility])     Patient Active Problem List   Diagnosis Date Noted  . Acute episode of schizophrenia with history of multiple episodes (HCC) 06/30/2017  . Schizophrenia, paranoid type (HCC) 06/08/2017  . 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 Psychoses (HCC)   . Paranoid schizophrenia (HCC) 09/29/2015  . Cannabis use disorder, moderate, in early remission (HCC) 09/26/2015  . History of posttraumatic stress disorder (PTSD) 09/26/2015    Past Surgical History:  Procedure Laterality Date  . abd surgery s/p traumatic event    . facial reconstructive surgery    . NO PAST SURGERIES  patient stated he had facial reconstruction surgery as a child   . tumor removed from head          Family History  Problem Relation Age of Onset  . Hypertension Other   . Mental  illness Neg Hx     Social History   Tobacco Use  . Smoking status: Former Smoker    Types: Cigarettes  . Smokeless tobacco: Never Used  Substance Use Topics  . Alcohol use: No  . Drug use: No    Home Medications Prior to Admission medications   Medication Sig Start Date End Date Taking? Authorizing Provider  bacitracin ointment Apply 1 application topically 2 (two) times daily. 05/01/19   Fayrene Helper, PA-C    Allergies    Patient has no known allergies.  Review of Systems   Review of Systems  Constitutional: Negative for fever.  HENT: Negative for sore throat.   Eyes: Negative for visual disturbance.  Respiratory: Positive for cough. Negative for shortness of breath.   Cardiovascular: Negative for chest pain.  Gastrointestinal: Positive for abdominal pain and nausea. Negative for diarrhea.  Genitourinary: Negative for dysuria.  Musculoskeletal: Negative for neck pain.  Skin: Negative for rash.  Neurological: Negative for headaches.    Physical Exam Updated Vital Signs BP 103/69 (BP Location: Left Arm)   Pulse 61   Temp 98.2 F (36.8 C) (Oral)   Resp 16   SpO2 99%   Physical Exam Vitals and nursing note reviewed.  Constitutional:      Appearance: She is well-developed.  HENT:     Head: Normocephalic and atraumatic.  Eyes:     Conjunctiva/sclera: Conjunctivae normal.  Cardiovascular:  Rate and Rhythm: Normal rate and regular rhythm.     Heart sounds: No murmur.  Pulmonary:     Effort: Pulmonary effort is normal. No respiratory distress.     Breath sounds: Normal breath sounds.  Abdominal:     General: Abdomen is flat.     Tenderness: There is abdominal tenderness (difuse). There is no guarding or rebound.  Musculoskeletal:        General: No deformity or signs of injury. Normal range of motion.     Cervical back: Neck supple.  Skin:    General: Skin is warm and dry.  Neurological:     General: No focal deficit present.     Mental Status: She is  alert.     GCS: GCS eye subscore is 4. GCS verbal subscore is 5. GCS motor subscore is 6.     ED Results / Procedures / Treatments   Labs (all labs ordered are listed, but only abnormal results are displayed) Labs Reviewed  CBC WITH DIFFERENTIAL/PLATELET - Abnormal; Notable for the following components:      Result Value   Hemoglobin 12.9 (*)    All other components within normal limits  COMPREHENSIVE METABOLIC PANEL  LIPASE, BLOOD    EKG None  Radiology DG Chest Port 1 View  Result Date: 11/22/2019 CLINICAL DATA:  Cough EXAM: PORTABLE CHEST 1 VIEW COMPARISON:  09/30/2018 FINDINGS: The heart size and mediastinal contours are within normal limits. Both lungs are clear. The visualized skeletal structures are unremarkable. IMPRESSION: Normal study. Electronically Signed   By: Rolm Baptise M.D.   On: 11/22/2019 21:25    Procedures Procedures (including critical care time)  Medications Ordered in ED Medications  ondansetron (ZOFRAN) injection 4 mg (has no administration in time range)    ED Course  I have reviewed the triage vital signs and the nursing notes.  Pertinent labs & imaging results that were available during my care of the patient were reviewed by me and considered in my medical decision making (see chart for details).  Clinical Course as of Nov 23 1435  Tue Nov 22, 2019  2245 diferential diagnosis includes gastritis, gastroenteritis, peptic ulcer disease, pneumothorax, pneumonia.   [MB]  2246 Patient's chest x-ray interpreted by me as no pneumothorax no infiltrates.   [MB]  2246 I ordered a CBC chemistry and LFTs and lipase.  These were all normal.  He received some oral Zofran with improvement in his symptoms.   [MB]    Clinical Course User Index [MB] Hayden Rasmussen, MD   MDM Rules/Calculators/A&P                       Final Clinical Impression(s) / ED Diagnoses Final diagnoses:  Generalized abdominal pain  Cough    Rx / DC Orders ED Discharge  Orders         Ordered    ondansetron (ZOFRAN) 4 MG tablet  Every 8 hours PRN     11/22/19 2248           Hayden Rasmussen, MD 11/23/19 1437

## 2019-11-23 ENCOUNTER — Other Ambulatory Visit: Payer: Self-pay

## 2019-11-23 ENCOUNTER — Encounter (HOSPITAL_COMMUNITY): Payer: Self-pay | Admitting: Psychiatry

## 2019-11-23 ENCOUNTER — Inpatient Hospital Stay (HOSPITAL_COMMUNITY)
Admission: AD | Admit: 2019-11-23 | Discharge: 2019-12-06 | DRG: 885 | Disposition: A | Discharge status: Home / Self Care | Payer: Federal, State, Local not specified - Other | Attending: Psychiatry | Admitting: Psychiatry

## 2019-11-23 DIAGNOSIS — Z87891 Personal history of nicotine dependence: Secondary | ICD-10-CM | POA: Diagnosis not present

## 2019-11-23 DIAGNOSIS — Z9119 Patient's noncompliance with other medical treatment and regimen: Secondary | ICD-10-CM

## 2019-11-23 DIAGNOSIS — Z20822 Contact with and (suspected) exposure to covid-19: Secondary | ICD-10-CM | POA: Diagnosis present

## 2019-11-23 DIAGNOSIS — F200 Paranoid schizophrenia: Secondary | ICD-10-CM | POA: Diagnosis present

## 2019-11-23 DIAGNOSIS — Z590 Homelessness: Secondary | ICD-10-CM | POA: Diagnosis not present

## 2019-11-23 DIAGNOSIS — Z8249 Family history of ischemic heart disease and other diseases of the circulatory system: Secondary | ICD-10-CM

## 2019-11-23 DIAGNOSIS — F201 Disorganized schizophrenia: Principal | ICD-10-CM | POA: Diagnosis present

## 2019-11-23 DIAGNOSIS — F209 Schizophrenia, unspecified: Secondary | ICD-10-CM | POA: Diagnosis present

## 2019-11-23 LAB — RESPIRATORY PANEL BY RT PCR (FLU A&B, COVID)
Influenza A by PCR: NEGATIVE
Influenza B by PCR: NEGATIVE
SARS Coronavirus 2 by RT PCR: NEGATIVE

## 2019-11-23 MED ORDER — ALUM & MAG HYDROXIDE-SIMETH 200-200-20 MG/5ML PO SUSP: 30.0000 mL | ORAL | Status: DC | PRN

## 2019-11-23 MED ORDER — TRAZODONE HCL 50 MG PO TABS
50.0000 mg | ORAL_TABLET | Freq: Every evening | ORAL | Status: DC | PRN
  Administered 2019-11-23 – 2019-12-02 (×2): 50 mg via ORAL
  Filled 2019-11-23 (×2): qty 1

## 2019-11-23 MED ORDER — MAGNESIUM HYDROXIDE 400 MG/5ML PO SUSP: 30.0000 mL | Freq: Every day | ORAL | Status: DC | PRN

## 2019-11-23 MED ORDER — ACETAMINOPHEN 325 MG PO TABS: 650.0000 mg | ORAL_TABLET | Freq: Four times a day (QID) | ORAL | Status: DC | PRN

## 2019-11-23 MED ORDER — HYDROXYZINE HCL 25 MG PO TABS
25.0000 mg | ORAL_TABLET | Freq: Three times a day (TID) | ORAL | Status: DC | PRN
  Administered 2019-12-03: 25 mg via ORAL
  Filled 2019-11-23: qty 1

## 2019-11-23 MED ORDER — PALIPERIDONE ER 6 MG PO TB24
6.0000 mg | ORAL_TABLET | Freq: Every day | ORAL | Status: DC
  Administered 2019-11-23: 6 mg via ORAL
  Filled 2019-11-23 (×3): qty 1

## 2019-11-23 NOTE — H&P (Signed)
Behavioral Health Medical Screening Exam  Collin White is an 24 y.o. adult with history of schizophrenia, prefers male pronouns and to be called "Ms February." She was brought in by [facility] after being found at [facility] with incoherent speech and talking to people who were not there. History is difficult to obtain as patient is mumbling, significantly disorganized and delusional with some apparent thought blocking. Per chart review, she was seen at [facility] and prescribed Invega at one point. It is unclear how long patient has been off medications. She is disheveled and malodorous. She states she is in the hospital because "I have too much light in my body. I can't write because of my heart."  Chart review shows history of homelessness. Patient states [place] She is unable to provide contacts for collateral information and starts speaking about "ancestors and people with warrants" when asked about social supports. When asked about drug/alcohol use, she speaks about breakfast foods. She is oriented to place but unable to say year or month. She is agreeable to voluntary inpatient admission.  Total Time spent with patient: 15 minutes  Psychiatric Specialty Exam: Physical Exam  Nursing note and vitals reviewed. Cardiovascular: Normal rate.  Respiratory: Effort normal.  Musculoskeletal:        General: Normal range of motion.  Neurological: She is alert.  Oriented to person and place only    Review of Systems  Constitutional: Negative.   Respiratory: Negative for cough and shortness of breath.   Gastrointestinal: Negative for nausea and vomiting.  Psychiatric/Behavioral: Positive for confusion and hallucinations. Negative for agitation, behavioral problems and self-injury. The patient is nervous/anxious. The patient is not hyperactive.     Blood pressure (!) 133/94, pulse (!) 101, temperature 98 F (36.7 C), temperature source Oral, resp. rate 18, SpO2 96  %.There is no height or weight on file to calculate BMI.  General Appearance: Disheveled  Eye Contact:  Fair  Speech:  Slow  Volume:  Decreased  Mood:  Anxious  Affect:  Blunt  Thought Process:  Disorganized  Orientation:  Other:  oriented to person and place, not time  Thought Content:  Delusions  Suicidal Thoughts:  No  Homicidal Thoughts:  No  Memory:  Immediate;   Poor Recent;   Poor Remote;   Poor  Judgement:  Impaired  Insight:  Lacking  Psychomotor Activity:  Normal  Concentration: Concentration: Poor and Attention Span: Poor  Recall:  Poor  Fund of Knowledge:Poor  Language: Fair  Akathisia:  No  Handed:  Right  AIMS (if indicated):     Assets:  Leisure Time Resilience  Sleep:       Musculoskeletal: Strength & Muscle Tone: within normal limits Gait & Station: normal Patient leans: N/A  Blood pressure (!) 133/94, pulse (!) 101, temperature 98 F (36.7 C), temperature source Oral, resp. rate 18, SpO2 96 %.  Recommendations:  Based on my evaluation the patient does not appear to have an emergency medical condition.  Inpatient hospitalization.  Aldean Baker, NP 11/23/2019, 11:09 AM

## 2019-11-23 NOTE — Tx Team (Signed)
Initial Treatment Plan 11/23/2019 8:20 PM Emrys Mckamie KYB:[id number]    PATIENT STRESSORS: Financial difficulties Medication change or noncompliance Occupational concerns   PATIENT STRENGTHS: Motivation for treatment/growth   PATIENT IDENTIFIED PROBLEMS: Psychosis  Homelessness  Legal issues                 DISCHARGE CRITERIA:  Improved stabilization in mood, thinking, and/or behavior Reduction of life-threatening or endangering symptoms to within safe limits Verbal commitment to aftercare and medication compliance  PRELIMINARY DISCHARGE PLAN: Outpatient therapy Return to previous living arrangement  PATIENT/FAMILY INVOLVEMENT: This treatment plan has been presented to and reviewed with the patient, Dalan Cowger, and/or family member.  The patient and family have been given the opportunity to ask questions and make suggestions.  Tania Ade, RN 11/23/2019, 8:20 PM
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
## 2019-11-23 NOTE — Progress Notes (Signed)
Adult Psychoeducational Group Note  Date:  11/23/2019 Time:  9:39 PM  Group Topic/Focus:  Wrap-Up Group:   The focus of this group is to help patients review their daily goal of treatment and discuss progress on daily workbooks.  Participation Level:  Minimal  Participation Quality:  Appropriate  Affect:  Appropriate  Cognitive:  Lacking  Insight: Limited  Engagement in Group:  Engaged  Modes of Intervention:  Education and Support  Additional Comments:  Patient attended and participated in group. However this writer was unable to understand most of what he was saying. He did said that today he eat and spoke with his doctor  Scot Dock 11/23/2019, 9:39 PM

## 2019-11-23 NOTE — Progress Notes (Signed)
24 yo AA male patient, identify self as "Ms. February" admitted voluntarily to [facility] due to disorganized, confused and altered mental status. Soft spoken. Able to answer some questions appropriately but exhibits word salad and tangential. Calm and cooperative with staff. Appetite good, no combative behaviors noted. Patient denies intent to harm self or others but appears to be responding to internal stimuli during assessment. Disheveled and malodorous in appearance.
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
## 2019-11-23 NOTE — Progress Notes (Addendum)
   11/23/19 2143  Psych Admission Type (Psych Patients Only)  Admission Status Voluntary  Psychosocial Assessment  Patient Complaints Confusion;Disorientation  Eye Contact Fair  Facial Expression Flat  Affect Flat  Speech Pressured;Slurred;Incoherent  Interaction Guarded;Minimal  Motor Activity Slow  Appearance/Hygiene In hospital gown  Behavior Characteristics Cooperative;Calm  Mood Anxious;Preoccupied  Thought Process  Coherency Disorganized  Content Preoccupation  Delusions Paranoid  Perception Derealization  Hallucination Visual (sees and "feels" ghosts)  Judgment Poor  Confusion Mild  Danger to Self  Current suicidal ideation? Denies  Danger to Others  Danger to Others None reported or observed   Pt seen in his room. Pt speech is unintelligible and tangential. Mild confusion about what is going on with his current situation. When asked about family, pt states "they are here in me." When asked about pain, pt states "my body is 'sharp.'" Pt preoccupied with thoughts about his body. Report from day shift RN is that pt is hyper-sexual. Pt took medications without incident.

## 2019-11-24 DIAGNOSIS — F200 Paranoid schizophrenia: Secondary | ICD-10-CM

## 2019-11-24 LAB — COMPREHENSIVE METABOLIC PANEL
ALT: 42 U/L (ref 0–44)
AST: 55 U/L — ABNORMAL HIGH (ref 15–41)
Albumin: 4.2 g/dL (ref 3.5–5.0)
Alkaline Phosphatase: 76 U/L (ref 38–126)
Anion gap: 9 (ref 5–15)
BUN: 25 mg/dL — ABNORMAL HIGH (ref 6–20)
CO2: 25 mmol/L (ref 22–32)
Calcium: 9.1 mg/dL (ref 8.9–10.3)
Chloride: 113 mmol/L — ABNORMAL HIGH (ref 98–111)
Creatinine, Ser: 1.11 mg/dL (ref 0.61–1.24)
GFR calc Af Amer: >60 mL/min (ref >60)
GFR calc non Af Amer: >60 mL/min (ref >60)
Glucose, Bld: 94 mg/dL (ref 70–99)
Potassium: 3.7 mmol/L (ref 3.5–5.1)
Sodium: 147 mmol/L — ABNORMAL HIGH (ref 135–145)
Total Bilirubin: 0.4 mg/dL (ref 0.3–1.2)
Total Protein: 7.3 g/dL (ref 6.5–8.1)

## 2019-11-24 LAB — URINALYSIS, ROUTINE W REFLEX MICROSCOPIC
Bilirubin Urine: NEGATIVE
Glucose, UA: NEGATIVE mg/dL
Hgb urine dipstick: NEGATIVE
Ketones, ur: 5 mg/dL — AB
Leukocytes,Ua: NEGATIVE
Nitrite: NEGATIVE
Protein, ur: 30 mg/dL — AB
Specific Gravity, Urine: 1.034 — ABNORMAL HIGH (ref 1.005–1.030)
pH: 5 (ref 5.0–8.0)

## 2019-11-24 LAB — CBC
HCT: 40 % (ref 39.0–52.0)
Hemoglobin: 12.8 g/dL — ABNORMAL LOW (ref 13.0–17.0)
MCH: 29 pg (ref 26.0–34.0)
MCHC: 32 g/dL (ref 30.0–36.0)
MCV: 90.5 fL (ref 80.0–100.0)
Platelets: 167 10*3/uL (ref 150–400)
RBC: 4.42 MIL/uL (ref 4.22–5.81)
RDW: 13.2 % (ref 11.5–15.5)
WBC: 7 10*3/uL (ref 4.0–10.5)
nRBC: 0 % (ref 0.0–0.2)

## 2019-11-24 LAB — RAPID URINE DRUG SCREEN, HOSP PERFORMED
Amphetamines: NOT DETECTED
Barbiturates: NOT DETECTED
Benzodiazepines: NOT DETECTED
Cocaine: NOT DETECTED
Opiates: NOT DETECTED
Tetrahydrocannabinol: NOT DETECTED

## 2019-11-24 LAB — LIPID PANEL
Cholesterol: 119 mg/dL (ref 0–200)
HDL: 39 mg/dL — ABNORMAL LOW (ref >40)
LDL Cholesterol: 51 mg/dL (ref 0–99)
Total CHOL/HDL Ratio: 3.1 RATIO
Triglycerides: 143 mg/dL (ref <150)
VLDL: 29 mg/dL (ref 0–40)

## 2019-11-24 LAB — TSH: TSH: 0.901 u[IU]/mL (ref 0.350–4.500)

## 2019-11-24 LAB — ETHANOL: Alcohol, Ethyl (B): <10 mg/dL (ref <10)

## 2019-11-24 MED ORDER — RISPERIDONE 3 MG PO TABS
3.0000 mg | ORAL_TABLET | Freq: Two times a day (BID) | ORAL | Status: DC
  Administered 2019-11-24 – 2019-12-02 (×17): 3 mg via ORAL
  Filled 2019-11-24 (×21): qty 1

## 2019-11-24 MED ORDER — LORAZEPAM 0.5 MG PO TABS
0.5000 mg | ORAL_TABLET | Freq: Three times a day (TID) | ORAL | Status: AC
  Administered 2019-11-24 – 2019-11-25 (×6): 0.5 mg via ORAL
  Filled 2019-11-24 (×6): qty 1

## 2019-11-24 MED ORDER — TEMAZEPAM 30 MG PO CAPS
30.0000 mg | ORAL_CAPSULE | Freq: Every day | ORAL | Status: DC
  Administered 2019-11-25: 30 mg via ORAL
  Filled 2019-11-24 (×2): qty 1

## 2019-11-24 MED ORDER — BENZTROPINE MESYLATE 0.5 MG PO TABS
0.5000 mg | ORAL_TABLET | Freq: Two times a day (BID) | ORAL | Status: DC
  Administered 2019-11-24 – 2019-12-06 (×25): 0.5 mg via ORAL
  Filled 2019-11-24 (×22): qty 1
  Filled 2019-11-24: qty 14
  Filled 2019-11-24 (×5): qty 1
  Filled 2019-11-24: qty 14
  Filled 2019-11-24 (×3): qty 1

## 2019-11-24 NOTE — [facility] Suicide Risk Assessment (Signed)
[facility] Admission Suicide Risk Assessment   Nursing information obtained from:  Patient Demographic factors:  Male, [name], lesbian, or bisexual orientation Current Mental Status:  NA Loss Factors:  NA Historical Factors:  NA Risk Reduction Factors:  NA  Total Time spent with patient: 45 minutes Principal Problem: Exacerbation of psychotic disorder Diagnosis:  Active Problems:   Schizophrenia (HCC)  Subjective Data: readmitted for psychosis   Continued Clinical Symptoms:  Alcohol Use Disorder Identification Test Final Score (AUDIT): 0 The "Alcohol Use Disorders Identification Test", Guidelines for Use in Primary Care, Second Edition.  World Science writer [name]). Score between 0-7:  no or low risk or alcohol related problems. Score between 8-15:  moderate risk of alcohol related problems. Score between 16-19:  high risk of alcohol related problems. Score 20 or above:  warrants further diagnostic evaluation for alcohol dependence and treatment.   CLINICAL FACTORS:   Previous Psychiatric Diagnoses and Treatments  Musculoskeletal: Strength & Muscle Tone: within normal limits Gait & Station: normal Patient leans: N/A  Psychiatric Specialty Exam: Physical Exam  Nursing note and vitals reviewed. HENT:  Head: Normocephalic and atraumatic.  Cardiovascular: Normal rate and regular rhythm.    Review of Systems  Constitutional: Negative.   Respiratory: Negative.   Cardiovascular: Negative.   Endocrine: Negative.   Genitourinary: Negative.   Neurological: Negative.     Blood pressure 116/72, pulse 97, temperature 98.7 F (37.1 C), temperature source Oral, resp. rate 18, SpO2 96 %.There is no height or weight on file to calculate BMI.  General Appearance: Disheveled  Eye Contact:  Minimal  Speech:  Garbled and Slow  Volume:  Decreased  Mood:  Dysphoric  Affect:  Restricted  Thought Process:  Disorganized  Orientation:  Other:  Person general situation will not elaborate day  date or time  Thought Content:  Illogical and Hallucinations: Auditory/recently  Suicidal Thoughts:  No  Homicidal Thoughts:  No  Memory:  Immediate;   Poor Recent;   Poor Remote;   Poor  Judgement:  Impaired  Insight:  Shallow  Psychomotor Activity:  Decreased  Concentration:  Concentration: Poor and Attention Span: Poor  Recall:  Poor  Fund of Knowledge:  Poor  Language:  Poor  Akathisia:  Negative  Handed:  Right  AIMS (if indicated):     Assets:  Physical Health Resilience  ADL's:  Intact  Cognition:  Impaired,  Mild and Moderate  Sleep:  Number of Hours: 6    COGNITIVE FEATURES THAT CONTRIBUTE TO RISK:  Loss of executive function    SUICIDE RISK:   Minimal: No identifiable suicidal ideation.  Patients presenting with no risk factors but with morbid ruminations; may be classified as minimal risk based on the severity of the depressive symptoms  PLAN OF CARE: admit  I certify that inpatient services furnished can reasonably be expected to improve the patient's condition.   Malvin Johns, MD 11/24/2019, 8:15 AM

## 2019-11-24 NOTE — Progress Notes (Signed)
Recreation Therapy Notes  Date: 4.8.21 Time: 0955 Location: 500 Hall Dayroom  Group Topic: Self-Esteem  Goal Area(s) Addresses:  Patient will successfully identify positive attributes about themselves.  Patient will successfully identify benefit of improved self-esteem.   Intervention: Magazines, construction paper, scissors, glue sticks, music  Activity: Collage About Me.  Patients were to use supplies provided to create a collage of things that represent them.  Patients were to show things they like, things they want to accomplish, things that are important to them, etc.  Education:  Self-Esteem, Discharge Planning.   Education Outcome: Acknowledges education/In group clarification offered/Needs additional education  Clinical Observations/Feedback:  Pt did not attend group.     Mariela Rex, LRT/CTRS         Seanna Sisler A 11/24/2019 11:03 AM 

## 2019-11-24 NOTE — [facility] Suicide Risk Assessment (Cosign Needed)
[name] INPATIENT:  Family/Significant Other Suicide Prevention Education  Suicide Prevention Education:  Patient Refusal for Family/Significant Other Suicide Prevention Education: The patient Collin White has refused to provide written consent for family/significant other to be provided Family/Significant Other Suicide Prevention Education during admission and/or prior to discharge.  Physician notified.  Reynold Bowen 11/24/2019, 9:30 AM

## 2019-11-24 NOTE — Progress Notes (Signed)
   11/24/19 2137  Psych Admission Type (Psych Patients Only)  Admission Status Voluntary  Psychosocial Assessment  Patient Complaints Confusion;Decreased concentration  Eye Contact Brief  Facial Expression Flat  Affect Flat  Speech Word salad  Interaction Guarded;Minimal;Isolative  Motor Activity Slow  Appearance/Hygiene In hospital gown  Behavior Characteristics Cooperative;Guarded  Mood Preoccupied;Depressed  Thought Process  Coherency Disorganized  Content Preoccupation  Delusions Paranoid  Perception Derealization  Hallucination UTA  Judgment WDL  Confusion UTA  Danger to Self  Current suicidal ideation? Denies  Danger to Others  Danger to Others None reported or observed   Pt seen in bed. Pt has been in bed and isolative most of the day per shift report. Pt denies SI, HI, AVH and pain.

## 2019-11-24 NOTE — H&P (Signed)
Psychiatric Admission Assessment Adult  Patient Identification: Collin White MRN:  [id number] Date of Evaluation:  11/24/2019 Chief Complaint:  Schizophrenia ([place]) [F20.9] Principal Diagnosis: Chronic paranoid schizophrenia Diagnosis:  Active Problems:   Schizophrenia ([place])  History of Present Illness:   This is the latest of multiple healthcare system encounters/admissions for this 24 year old homelessness individual, who presents once again in a disorganized state of mind.  This is typical of this individual's psychotic exacerbations.  Apparently there is chronic noncompliance.  The patient was found at a local shopping center, actively hallucinating somewhat disorganized and brought in by [facility] police.  Described as "disheveled and malodorous" and speaking in delusional terms.  When I interviewed the patient, there is neologisms, mumbling and often incoherent speech, focused on the right flank pain but that tends to be delusional describing "light" in the body as best I can tell but again speech is garbled and combined with neologisms. The patient has had 2 prior urine drug screens with prior encounters and they have all been negative  The patient is apparently transgender however I think the condition of psychosis supersedes all other diagnoses as a priority, and gender identity issues must be viewed in the context of the severity of untreated psychosis  According to NP eval of 4/7: Collin White is an 24 y.o. adult with history of schizophrenia, prefers male pronouns and to be called "Ms February." She was brought in by [facility] after being found at [facility] with incoherent speech and talking to people who were not there. History is difficult to obtain as patient is mumbling, significantly disorganized and delusional with some apparent thought blocking. Per chart review, she was seen at [facility] and prescribed Invega at one point. It is unclear how long patient has been off  medications. She is disheveled and malodorous. She states she is in the hospital because "I have too much light in my body. I can't write because of my heart."  Chart review shows history of homelessness. Patient states she lives in "a [place] here, a castle there." She is unable to provide contacts for collateral information and starts speaking about "ancestors and people with warrants" when asked about social supports. When asked about drug/alcohol use, she speaks about breakfast foods. She is oriented to place but unable to say year or month. She is agreeable to voluntary inpatient admission. 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 Yes.    Has the patient been a risk to self in the past 6 months? Yes.    Has the patient been a risk to self within the distant past? Yes.    Is the patient a risk to others? No.  Has the patient been a risk to others in the past 6 months? No.  Has the patient been a risk to others within the distant past? No.   Prior Inpatient Therapy:  Previous trials as above olanzapine/haloperidol/paliperidone/omeprazole briefly Prior Outpatient Therapy:  Noncompliant chronically  Alcohol Screening: 1. How often do you have a drink containing alcohol?: Never 2. How many drinks containing alcohol do you have on a typical day when you are drinking?: 1 or 2 3. How often do you have  six or more drinks on one occasion?: Never AUDIT-C Score: 0 9. Have you or someone else been injured as a result of  your drinking?: No 10. Has a relative or friend or a doctor or another health worker been concerned about your drinking or suggested you cut down?: No Alcohol Use Disorder Identification Test Final Score (AUDIT): 0 Alcohol Brief Interventions/Follow-up: AUDIT Score <7 follow-up not indicated Substance Abuse History in the last 12 months:  No. Consequences of Substance Abuse: NA Previous Psychotropic Medications: Yes  Psychological Evaluations: No  Past Medical History:  Past Medical History:  Diagnosis Date  . Migraines   . Psychoses (HCC)   . Schizophrenia (HCC)   . Seizures (HCC)     Past Surgical History:  Procedure Laterality Date  . abd surgery s/p traumatic event    . facial reconstructive surgery    . NO PAST SURGERIES  patient stated he had facial reconstruction surgery as a child   . 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 Hypertension Other   . Mental illness Neg Hx    Family Psychiatric  History: ukn Tobacco Screening:   Social History:  Social History   Substance and Sexual Activity  Alcohol Use No     Social History   Substance and Sexual Activity  Drug Use No    Additional Social History:                           Allergies:  No Known Allergies Lab Results:  Results for orders placed or performed during the hospital encounter of 11/23/19 (from the past 48 hour(s))  Respiratory Panel by RT PCR (Flu A&B, Covid) - Nasopharyngeal Swab     Status: None   Collection Time: 11/23/19 11:26 AM   Specimen: Nasopharyngeal Swab  Result Value Ref Range   SARS Coronavirus 2 by RT PCR NEGATIVE NEGATIVE    Comment: (NOTE) SARS-CoV-2 target nucleic acids are NOT DETECTED. The SARS-CoV-2 RNA is generally detectable in upper respiratoy specimens during the acute phase of infection. The lowest concentration of SARS-CoV-2 viral copies this assay can detect is 131 copies/mL. A negative result does not  preclude SARS-Cov-2 infection and should not be used as the sole basis for treatment or other patient management decisions. A negative result may occur with  improper specimen collection/handling, submission of specimen other than nasopharyngeal swab, presence of viral mutation(s) within the areas targeted by this assay, and inadequate number of viral copies (<131 copies/mL). A negative result must be combined with clinical observations, patient history, and epidemiological information. The expected result is Negative. Fact Sheet for Patients:  https://www.moore.com/ Fact Sheet for Healthcare Providers:  https://www.young.biz/ This test is not yet ap proved or cleared by the [place] FDA and  has been authorized for detection and/or diagnosis of SARS-CoV-2 by FDA under an Emergency Use Authorization (EUA). This EUA will remain  in effect (meaning this test can be used) for the duration of the COVID-19 declaration under Section 564(b)(1) of the Act, 21 U.S.C. section 360bbb-3(b)(1), unless the authorization is terminated or revoked sooner.    Influenza A by PCR NEGATIVE NEGATIVE   Influenza B by PCR NEGATIVE NEGATIVE    Comment: (NOTE) The Xpert Xpress SARS-CoV-2/FLU/RSV assay is intended as an aid in  the diagnosis of influenza from Nasopharyngeal swab specimens and  should not be used as a sole  basis for treatment. Nasal washings and  aspirates are unacceptable for Xpert Xpress SARS-CoV-2/FLU/RSV  testing. Fact Sheet for Patients: https://www.moore.com/ Fact Sheet for Healthcare Providers: https://www.young.biz/ This test is not yet approved or cleared by the [place] FDA and  has been authorized for detection and/or diagnosis of SARS-CoV-2 by  FDA under an Emergency Use Authorization (EUA). This EUA will remain  in effect (meaning this test can be used) for the duration of the  Covid-19  declaration under Section 564(b)(1) of the Act, 21  U.S.C. section 360bbb-3(b)(1), unless the authorization is  terminated or revoked. Performed at [facility], [address], [place], [place] [address]   CBC     Status: Abnormal   Collection Time: 11/24/19  6:41 AM  Result Value Ref Range   WBC 7.0 4.0 - 10.5 K/uL   RBC 4.42 4.22 - 5.81 MIL/uL   Hemoglobin 12.8 (L) 13.0 - 17.0 g/dL   HCT 29.4 76.5 - 46.5 %   MCV 90.5 80.0 - 100.0 fL   MCH 29.0 26.0 - 34.0 pg   MCHC 32.0 30.0 - 36.0 g/dL   RDW 03.5 46.5 - 68.1 %   Platelets 167 150 - 400 K/uL   nRBC 0.0 0.0 - 0.2 %    Comment: Performed at [facility], [address] [place], [place] [address]    Blood Alcohol level:  Lab Results  Component Value Date   [facility] <10 08/19/2018   ETH <10 08/13/2018    Metabolic Disorder Labs:  Lab Results  Component Value Date   HGBA1C 5.1 07/06/2017   MPG 99.67 07/06/2017   MPG 103 06/10/2017   Lab Results  Component Value Date   PROLACTIN 21.6 (H) 07/06/2017   PROLACTIN 32.0 (H) 06/10/2017   Lab Results  Component Value Date   CHOL 177 07/06/2017   TRIG 174 (H) 07/06/2017   HDL 51 07/06/2017   CHOLHDL 3.5 07/06/2017   VLDL 35 07/06/2017   LDLCALC 91 07/06/2017   LDLCALC 61 06/10/2017    Current Medications: Current Facility-Administered Medications  Medication Dose Route Frequency Provider Last Rate Last Admin  . acetaminophen (TYLENOL) tablet 650 mg  650 mg Oral Q6H PRN Aldean Baker, NP      . alum & mag hydroxide-simeth (MAALOX/MYLANTA) 200-200-20 MG/5ML suspension 30 mL  30 mL Oral Q4H PRN Aldean Baker, NP      . benztropine (COGENTIN) tablet 0.5 mg  0.5 mg Oral BID Malvin Johns, MD      . hydrOXYzine (ATARAX/VISTARIL) tablet 25 mg  25 mg Oral TID PRN Aldean Baker, NP      . LORazepam (ATIVAN) tablet 0.5 mg  0.5 mg Oral TID Malvin Johns, MD      . magnesium hydroxide (MILK OF MAGNESIA) suspension 30 mL  30 mL Oral Daily PRN  Aldean Baker, NP      . risperiDONE (RISPERDAL) tablet 3 mg  3 mg Oral BID Malvin Johns, MD      . temazepam (RESTORIL) capsule 30 mg  30 mg Oral QHS Malvin Johns, MD      . traZODone (DESYREL) tablet 50 mg  50 mg Oral QHS PRN Aldean Baker, NP   50 mg at 11/23/19 2103   PTA Medications: Medications Prior to Admission  Medication Sig Dispense Refill Last Dose  . ondansetron (ZOFRAN) 4 MG tablet Take 1 tablet (4 mg total) by mouth every 8 (eight) hours as needed for nausea or vomiting.  15 tablet 0     Musculoskeletal: Strength & Muscle Tone: within normal limits Gait & Station: normal Patient leans: N/A  Psychiatric Specialty Exam: Physical Exam  Nursing note and vitals reviewed. HENT:  Head: Normocephalic and atraumatic.  Cardiovascular: Normal rate and regular rhythm.    Review of Systems  Constitutional: Negative.   Respiratory: Negative.   Cardiovascular: Negative.   Endocrine: Negative.   Genitourinary: Negative.   Neurological: Negative.     Blood pressure 116/72, pulse 97, temperature 98.7 F (37.1 C), temperature source Oral, resp. rate 18, SpO2 96 %.There is no height or weight on file to calculate BMI.  General Appearance: Disheveled  Eye Contact:  Minimal  Speech:  Garbled and Slow  Volume:  Decreased  Mood:  Dysphoric  Affect:  Restricted  Thought Process:  Disorganized  Orientation:  Other:  Person general situation will not elaborate day date or time  Thought Content:  Illogical and Hallucinations: Auditory/recently  Suicidal Thoughts:  No  Homicidal Thoughts:  No  Memory:  Immediate;   Poor Recent;   Poor Remote;   Poor  Judgement:  Impaired  Insight:  Shallow  Psychomotor Activity:  Decreased  Concentration:  Concentration: Poor and Attention Span: Poor  Recall:  Poor  Fund of Knowledge:  Poor  Language:  Poor  Akathisia:  Negative  Handed:  Right  AIMS (if indicated):     Assets:  Physical Health Resilience  ADL's:  Intact  Cognition:   Impaired,  Mild and Moderate  Sleep:  Number of Hours: 6    Treatment Plan Summary: Daily contact with patient to assess and evaluate symptoms and progress in treatment and Medication management  Observation Level/Precautions:  15 minute checks  Laboratory:  UDS  Psychotherapy: Reality based  Medications: Begin antipsychotics in anticipation of long-acting injectable  Consultations:    Discharge Concerns: Longer-term compliance and stability  Estimated LOS: 7-10  Other:     Physician Treatment Plan for Primary Diagnosis:   I-chronic undertreated/untreated schizophrenic condition Axis II consider borderline traits versus disorder Axis III no acute medical findings  Plans Begin antipsychotic in anticipation of long-acting injectable Continue current precautions  Long Term Goal(s): Improvement in symptoms so as ready for discharge  Short Term Goals: Ability to identify changes in lifestyle to reduce recurrence of condition will improve, Ability to verbalize feelings will improve, Ability to disclose and discuss suicidal ideas, Ability to demonstrate self-control will improve and Ability to identify and develop effective coping behaviors will improve  Physician Treatment Plan for Secondary Diagnosis: Active Problems:   Schizophrenia (HCC)  Long Term Goal(s): Improvement in symptoms so as ready for discharge  Short Term Goals: Ability to identify changes in lifestyle to reduce recurrence of condition will improve, Ability to verbalize feelings will improve, Ability to disclose and discuss suicidal ideas, Ability to demonstrate self-control will improve, Ability to identify and develop effective coping behaviors will improve and Ability to maintain clinical measurements within normal limits will improve  I certify that inpatient services furnished can reasonably be expected to improve the patient's condition.    Malvin Johns, MD 4/8/20217:59 AM

## 2019-11-24 NOTE — Progress Notes (Signed)
   11/24/19 0627  Vital Signs  Temp 98.7 F (37.1 C)  Temp Source Oral  Pulse Rate 62  Resp 18  BP 105/70  BP Location Right Arm  BP Method Automatic  Patient Position (if appropriate) Sitting  D: Patient Presents with depressed mood and guarded affect.  Patient was calm and cooperative during med pass and took his medicine without incident.  Patient was out in open areas briefly, but  Isolated in his room.  Patient denies suicidal thoughts and self harming thoughts.A:  Patient took scheduled medicine.  Support and encouragement provided Routine safety checks conducted every 15 minutes. Patient  Informed to notify staff with any concerns.  Safety maintained.R:  No adverse drug reactions noted.  Patient contracts for safety.  Patient compliant with medication and treatment plan. Patient cooperative and calm..  Safety maintained.

## 2019-11-24 NOTE — [facility] Counselor (Signed)
Adult Comprehensive Assessment  Patient ID: Collin White, adult   DOB: 08/22/1995, 24 y.o.   MRN: [id number]  Information Source: Information source: Patient  Current Stressors:  Patient states their primary concerns and needs for treatment are: Pt stated his "ribs was hurting and he was couching a lot. Felt pain in the stomach" Patient states their goals for this hospitilization and ongoing recovery are:: "no" Employment / Job issues: Unemployed Family Relationships: none Surveyor, quantity / Lack of resources (include bankruptcy): Dependent on others Housing / Lack of housing: none  Living/Environment/Situation:  Living Arrangements: "A castle"  Living conditions (as described by patient or guardian): States she has 3 different places in which she can stay  Family History:  Are you sexually active?: No What is your sexual orientation?: straight-transgender male to male Does patient have children?: Yes How many children?: 4 How is patient's relationship with their children?: "They are all adults.  I started having kids when I was 11."  Childhood History:  By whom was/is the patient raised?: Father, Other (Comment) Additional childhood history information: Raised by dad and uncles  Description of patient's relationship with caregiver when they were a child: Very close relationship Patient's description of current relationship with people who raised him/her: Good  Mother deceased when pt was latency age Does patient have siblings?: No Did patient suffer any verbal/emotional/physical/sexual abuse as a child?: No Did patient suffer from severe childhood neglect?: No Has patient ever been sexually abused/assaulted/raped as an adolescent or adult?: No Was the patient ever a victim of a crime or a disaster?: No Witnessed domestic violence?: No Has patient been effected by domestic violence as an adult?: No  Education:  Highest grade of school patient has completed: 10 Currently a  Consulting civil engineer?: No Learning disability?: No  Employment/Work Situation:   Employment situation: Unemployed Patient's job has been impacted by current illness: No What is the longest time patient has a held a job?: Almost a year  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 [employer]  Are There Guns or Other Weapons in Your Home?: "yes, a rifle in my Arts administrator Resources:   Financial resources: No income Does patient have a Lawyer or guardian?: No  Alcohol/Substance Abuse:   What has been your use of drugs/alcohol within the last 12 months?: Denies any drug use  Alcohol/Substance Abuse Treatment Hx: Denies past history Has alcohol/substance abuse ever caused legal problems?: No  Social Support System:   Conservation officer, nature Support System: Fair Development worker, community Support System: "My father and my fiance" Type of faith/religion: N/A How does patient's faith help to cope with current illness?: N/A  Leisure/Recreation:   Leisure and Hobbies: Singing, rapping, beat boxing, break dancing  Strengths/Needs:   What is the patient's perception of their strengths?: Break dancing and rapping Patient states they can use these personal strengths during their treatment to contribute to their recovery: No Patient states these barriers may affect/interfere with their treatment: None Patient states these barriers may affect their return to the community: None  "There's no such word as can't."  Discharge Plan:   Currently receiving community mental health services: No Patient states concerns and preferences for aftercare planning are: "no" Patient states they will know when they are safe and ready for discharge when: "when my arm feels better" Does patient have access to transportation?: Yes Does patient have financial barriers related to discharge medications?: Yes Patient description of barriers related to discharge medications: No income, no insurance Will  patient be returning to same living situation after discharge?: Yes. "to my car and castle"  Summary/Recommendations:   Summary and Recommendations (to be completed by the evaluator): Pt is a 23 year old transgender male diagnosed with schizophrenia. The patient was found at a local shopping center, actively hallucinating somewhat disorganized and brought in by [facility] police.  Described as "disheveled and malodorous" and speaking in delusional terms. During the assessment, pt was still disorganized and made statements that were delusional. CSW will continue to follow up with pt for discharge plan. Recommendations for py: crisis stabilization, therapeutic milieu, medication management, attend and participate in group therapy, and development of a comprehensive mental wellness plan.  Collin White. 11/24/2019

## 2019-11-25 LAB — HEMOGLOBIN A1C
Hgb A1c MFr Bld: 5.1 % (ref 4.8–5.6)
Mean Plasma Glucose: 100 mg/dL

## 2019-11-25 LAB — PROLACTIN: Prolactin: 32.4 ng/mL — ABNORMAL HIGH (ref 4.0–15.2)

## 2019-11-25 MED ORDER — DIVALPROEX SODIUM 500 MG PO DR TAB
500.0000 mg | DELAYED_RELEASE_TABLET | Freq: Every day | ORAL | Status: DC
  Administered 2019-11-25 – 2019-12-05 (×11): 500 mg via ORAL
  Filled 2019-11-25 (×14): qty 1

## 2019-11-25 NOTE — Tx Team (Signed)
Interdisciplinary Treatment and Diagnostic Plan Update  11/25/2019 Time of Session: 8:30am  Collin White MRN: [id number]  Principal Diagnosis: <principal problem not specified>  Secondary Diagnoses: Active Problems:   Schizophrenia ([place])   Current Medications:  Current Facility-Administered Medications  Medication Dose Route Frequency Provider Last Rate Last Admin  . acetaminophen (TYLENOL) tablet 650 mg  650 mg Oral Q6H PRN Connye Burkitt, NP      . alum & mag hydroxide-simeth (MAALOX/MYLANTA) 200-200-20 MG/5ML suspension 30 mL  30 mL Oral Q4H PRN Connye Burkitt, NP      . benztropine (COGENTIN) tablet 0.5 mg  0.5 mg Oral BID Johnn Hai, MD   0.5 mg at 11/25/19 6759  . hydrOXYzine (ATARAX/VISTARIL) tablet 25 mg  25 mg Oral TID PRN Connye Burkitt, NP      . LORazepam (ATIVAN) tablet 0.5 mg  0.5 mg Oral TID Johnn Hai, MD   0.5 mg at 11/25/19 0907  . magnesium hydroxide (MILK OF MAGNESIA) suspension 30 mL  30 mL Oral Daily PRN Connye Burkitt, NP      . risperiDONE (RISPERDAL) tablet 3 mg  3 mg Oral BID Johnn Hai, MD   3 mg at 11/25/19 0907  . temazepam (RESTORIL) capsule 30 mg  30 mg Oral QHS Johnn Hai, MD      . traZODone (DESYREL) tablet 50 mg  50 mg Oral QHS PRN Connye Burkitt, NP   50 mg at 11/23/19 2103   PTA Medications: Medications Prior to Admission  Medication Sig Dispense Refill Last Dose  . ondansetron (ZOFRAN) 4 MG tablet Take 1 tablet (4 mg total) by mouth every 8 (eight) hours as needed for nausea or vomiting. 15 tablet 0     Patient Stressors: Financial difficulties Medication change or noncompliance Occupational concerns  Patient Strengths: Motivation for treatment/growth  Treatment Modalities: Medication Management, Group therapy, Case management,  1 to 1 session with clinician, Psychoeducation, Recreational therapy.   Physician Treatment Plan for Primary Diagnosis: <principal problem not specified> Long Term Goal(s): Improvement in symptoms so as  ready for discharge Improvement in symptoms so as ready for discharge   Short Term Goals: Ability to identify changes in lifestyle to reduce recurrence of condition will improve Ability to verbalize feelings will improve Ability to disclose and discuss suicidal ideas Ability to demonstrate self-control will improve Ability to identify and develop effective coping behaviors will improve Ability to identify changes in lifestyle to reduce recurrence of condition will improve Ability to verbalize feelings will improve Ability to disclose and discuss suicidal ideas Ability to demonstrate self-control will improve Ability to identify and develop effective coping behaviors will improve Ability to maintain clinical measurements within normal limits will improve  Medication Management: Evaluate patient's response, side effects, and tolerance of medication regimen.  Therapeutic Interventions: 1 to 1 sessions, Unit Group sessions and Medication administration.  Evaluation of Outcomes: Progressing  Physician Treatment Plan for Secondary Diagnosis: Active Problems:   Schizophrenia ([place])  Long Term Goal(s): Improvement in symptoms so as ready for discharge Improvement in symptoms so as ready for discharge   Short Term Goals: Ability to identify changes in lifestyle to reduce recurrence of condition will improve Ability to verbalize feelings will improve Ability to disclose and discuss suicidal ideas Ability to demonstrate self-control will improve Ability to identify and develop effective coping behaviors will improve Ability to identify changes in lifestyle to reduce recurrence of condition will improve Ability to verbalize feelings will improve Ability to disclose and discuss  suicidal ideas Ability to demonstrate self-control will improve Ability to identify and develop effective coping behaviors will improve Ability to maintain clinical measurements within normal limits will improve      Medication Management: Evaluate patient's response, side effects, and tolerance of medication regimen.  Therapeutic Interventions: 1 to 1 sessions, Unit Group sessions and Medication administration.  Evaluation of Outcomes: Progressing   RN Treatment Plan for Primary Diagnosis: <principal problem not specified> Long Term Goal(s): Knowledge of disease and therapeutic regimen to maintain health will improve  Short Term Goals: Ability to participate in decision making will improve, Ability to verbalize feelings will improve, Ability to disclose and discuss suicidal ideas, Ability to identify and develop effective coping behaviors will improve and Compliance with prescribed medications will improve  Medication Management: RN will administer medications as ordered by provider, will assess and evaluate patient's response and provide education to patient for prescribed medication. RN will report any adverse and/or side effects to prescribing provider.  Therapeutic Interventions: 1 on 1 counseling sessions, Psychoeducation, Medication administration, Evaluate responses to treatment, Monitor vital signs and CBGs as ordered, Perform/monitor CIWA, COWS, AIMS and Fall Risk screenings as ordered, Perform wound care treatments as ordered.  Evaluation of Outcomes: Progressing   LCSW Treatment Plan for Primary Diagnosis: <principal problem not specified> Long Term Goal(s): Safe transition to appropriate next level of care at discharge, Engage patient in therapeutic group addressing interpersonal concerns.  Short Term Goals: Engage patient in aftercare planning with referrals and resources  Therapeutic Interventions: Assess for all discharge needs, 1 to 1 time with Social worker, Explore available resources and support systems, Assess for adequacy in community support network, Educate family and significant other(s) on suicide prevention, Complete Psychosocial Assessment, Interpersonal group  therapy.  Evaluation of Outcomes: Progressing   Progress in Treatment: Attending groups: No. new to unit Participating in groups: No. Taking medication as prescribed: Yes. Toleration medication: Yes. Family/Significant other contact made: Yes, individual(s) contacted:  pt declined  Patient understands diagnosis: No. Discussing patient identified problems/goals with staff: Yes. Medical problems stabilized or resolved: Yes. Denies suicidal/homicidal ideation: Yes. Issues/concerns per patient self-inventory: No. Other:   New problem(s) identified: No, Describe:  none  New Short Term/Long Term Goal(s): Medication stabilization, elimination of SI thoughts, and development of a comprehensive mental wellness plan.   Patient Goals:  "get my pain in stomach looked at"  Discharge Plan or Barriers: CSW will continue to follow up for appropriate referrals and possible discharge planning  Reason for Continuation of Hospitalization: Delusions  Hallucinations Medication stabilization  Estimated Length of Stay: 3-5 days   Attendees: Patient:Collin White  11/25/2019   Physician: Dr. Jeannine Kitten, MD 11/25/2019   Nursing: Adela Ports., RN 11/25/2019   RN Care Manager: 11/25/2019   Social Worker: Daleen Squibb, LCSW  11/25/2019   Recreational Therapist:  11/25/2019   Other: Earlyne Iba, MSW intern  11/25/2019   Other:  11/25/2019   Other: 11/25/2019     Scribe for Treatment Team: Reynold Bowen, Student-Social Work 11/25/2019 9:26 AM

## 2019-11-25 NOTE — Progress Notes (Signed)
Pt presents with flat affect.  Pt is unable to articulate his thoughts in a logical way.  Pt mumbles when he speaks.  Pt has been calm and denies any needs/concerns.  Pt took medications without incident.  RN will continue to monitor and provide support as needed.     11/25/19 0900  Psych Admission Type (Psych Patients Only)  Admission Status Voluntary  Psychosocial Assessment  Patient Complaints Confusion  Eye Contact Brief  Facial Expression Flat  Affect Flat  Speech Word salad  Interaction Guarded;Minimal;Isolative  Motor Activity Slow  Appearance/Hygiene In hospital gown  Behavior Characteristics Guarded;Cooperative  Mood Depressed  Thought Process  Coherency Disorganized  Content Preoccupation  Delusions Paranoid  Perception Derealization  Hallucination None reported or observed  Judgment WDL  Confusion Mild  Danger to Self  Current suicidal ideation? Denies  Danger to Others  Danger to Others None reported or observed

## 2019-11-25 NOTE — Progress Notes (Signed)
[facility] MD Progress Note  11/25/2019 12:51 PM Collin White  MRN:  [id number] Subjective:   This is the latest of multiple healthcare system encounters/admissions for this 24 year old homelessness individual, who presents once again in a disorganized state of mind.  This is typical of this individual's psychotic exacerbations.  Apparently there is chronic noncompliance. The patient was [place] shopping [place], actively hallucinating somewhat disorganized and brought in by [name]. Described as "disheveled and malodorous" and speaking in delusional terms.  Drug screen negative on this admission  Patient was seen individually and on treatment team rounds, he is alert and oriented to person and general situation continues to be focused on his flank and this is delusional, he is complaining of pain but also confuses it with "light" he rambles he no longer makes neologisms but his speech is garbled and generally difficult to follow.  He denies auditory or visual hallucinations but again tends to state random things rather than answer the questions appropriately. Principal Problem:  Diagnosis: Active Problems:   Schizophrenia ([place])  Total Time spent with patient: 20 minutes  Past Psychiatric History: Chronic untreated psychosis  Past Medical History:  Past Medical History:  Diagnosis Date  . Migraines   . Psychoses ([place])   . Schizophrenia ([place])   . Seizures ([place])     Past Surgical History:  Procedure Laterality Date  . abd surgery s/p traumatic event    . facial reconstructive surgery    . NO PAST SURGERIES  patient stated he had facial reconstruction surgery as a child   . 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 Hypertension Other   . Mental illness Neg Hx    Family Psychiatric  History: No new data shared Social History:  Social History   Substance and Sexual Activity  Alcohol Use No     Social History    Substance and Sexual Activity  Drug Use No    Social History   Socioeconomic History  . Marital status: Single    Spouse name: Not on file  . Number of children: Not on file  . Years of education: Not on file  . Highest education level: Not on file  Occupational History  . Not on file  Tobacco Use  . Smoking status: Former Smoker    Types: Cigarettes  . Smokeless tobacco: Never Used  Substance and Sexual Activity  . Alcohol use: No  . Drug use: No  . Sexual activity: Yes    Birth control/protection: Condom  Other Topics Concern  . Not on file  Social History Narrative   ** Merged History Encounter **       ** Merged History Encounter **       Social Determinants of Health   Financial Resource Strain:   . Difficulty of Paying Living Expenses:   Food Insecurity:   . Worried About Charity fundraiser in the Last Year:   . Arboriculturist in the Last Year:   Transportation Needs:   . Film/video editor (Medical):   Marland Kitchen Lack of Transportation (Non-Medical):   Physical Activity:   . Days of Exercise per Week:   . Minutes of Exercise per Session:   Stress:   . Feeling of Stress :   Social Connections:   . Frequency of Communication with Friends and Family:   . Frequency of Social Gatherings with Friends and Family:   .  Attends Religious Services:   . Active Member of Clubs or Organizations:   . Attends BankerClub or Organization Meetings:   Marland Kitchen. Marital Status:    Additional Social History:                         Sleep: Fair  Appetite:  Fair  Current Medications: Current Facility-Administered Medications  Medication Dose Route Frequency Provider Last Rate Last Admin  . acetaminophen (TYLENOL) tablet 650 mg  650 mg Oral Q6H PRN Aldean BakerSykes, Janet E, NP      . alum & mag hydroxide-simeth (MAALOX/MYLANTA) 200-200-20 MG/5ML suspension 30 mL  30 mL Oral Q4H PRN Aldean BakerSykes, Janet E, NP      . benztropine (COGENTIN) tablet 0.5 mg  0.5 mg Oral BID Malvin JohnsFarah, Margarine Grosshans, MD   0.5  mg at 11/25/19 [id number]  . hydrOXYzine (ATARAX/VISTARIL) tablet 25 mg  25 mg Oral TID PRN Aldean BakerSykes, Janet E, NP      . LORazepam (ATIVAN) tablet 0.5 mg  0.5 mg Oral TID Malvin JohnsFarah, Renuka Farfan, MD   0.5 mg at 11/25/19 0907  . magnesium hydroxide (MILK OF MAGNESIA) suspension 30 mL  30 mL Oral Daily PRN Aldean BakerSykes, Janet E, NP      . risperiDONE (RISPERDAL) tablet 3 mg  3 mg Oral BID Malvin JohnsFarah, Jalien Weakland, MD   3 mg at 11/25/19 0907  . temazepam (RESTORIL) capsule 30 mg  30 mg Oral QHS Malvin JohnsFarah, Niang Mitcheltree, MD      . traZODone (DESYREL) tablet 50 mg  50 mg Oral QHS PRN Aldean BakerSykes, Janet E, NP   50 mg at 11/23/19 2103    Lab Results:  Results for orders placed or performed during the hospital encounter of 11/23/19 (from the past 48 hour(s))  CBC     Status: Abnormal   Collection Time: 11/24/19  6:41 AM  Result Value Ref Range   WBC 7.0 4.0 - 10.5 K/uL   RBC 4.42 4.22 - 5.81 MIL/uL   Hemoglobin 12.8 (L) 13.0 - 17.0 g/dL   HCT 11.940.0 14.739.0 - 82.952.0 %   MCV 90.5 80.0 - 100.0 fL   MCH 29.0 26.0 - 34.0 pg   MCHC 32.0 30.0 - 36.0 g/dL   RDW 56.213.2 13.011.5 - 86.515.5 %   Platelets 167 150 - 400 K/uL   nRBC 0.0 0.0 - 0.2 %    Comment: Performed at [facility], [address] [place], [place] [id number]  Comprehensive metabolic panel     Status: Abnormal   Collection Time: 11/24/19  6:41 AM  Result Value Ref Range   Sodium 147 (H) 135 - 145 mmol/L   Potassium 3.7 3.5 - 5.1 mmol/L   Chloride 113 (H) 98 - 111 mmol/L   CO2 25 22 - 32 mmol/L   Glucose, Bld 94 70 - 99 mg/dL    Comment: Glucose reference range applies only to samples taken after fasting for at least 8 hours.   BUN 25 (H) 6 - 20 mg/dL   Creatinine, Ser 6.291.11 0.61 - 1.24 mg/dL   Calcium 9.1 8.9 - 52.810.3 mg/dL   Total Protein 7.3 6.5 - 8.1 g/dL   Albumin 4.2 3.5 - 5.0 g/dL   AST 55 (H) 15 - 41 U/L   ALT 42 0 - 44 U/L   Alkaline Phosphatase 76 38 - 126 U/L   Total Bilirubin 0.4 0.3 - 1.2 mg/dL   GFR calc non Af Amer >60 >60 mL/min   GFR calc Af Amer >  60 >60 mL/min    Anion gap 9 5 - 15    Comment: Performed at [facility], [address], [place], [place] [address]  Hemoglobin A1c     Status: None   Collection Time: 11/24/19  6:41 AM  Result Value Ref Range   Hgb A1c MFr Bld 5.1 4.8 - 5.6 %    Comment: (NOTE)         Prediabetes: 5.7 - 6.4         Diabetes: >6.4         Glycemic control for adults with diabetes: <7.0    Mean Plasma Glucose 100 mg/dL    Comment: (NOTE) Performed At: [facility] [address] [place], [place] [id number] Jolene Schimke MD FB:[id number]   Ethanol     Status: None   Collection Time: 11/24/19  6:41 AM  Result Value Ref Range   Alcohol, Ethyl (B) <10 <10 mg/dL    Comment: (NOTE) Lowest detectable limit for serum alcohol is 10 mg/dL. For medical purposes only. Performed at [facility], [address], [place], [place] [address]   Lipid panel     Status: Abnormal   Collection Time: 11/24/19  6:41 AM  Result Value Ref Range   Cholesterol 119 0 - 200 mg/dL   Triglycerides 536 <144 mg/dL   HDL 39 (L) >31 mg/dL   Total CHOL/HDL Ratio 3.1 RATIO   VLDL 29 0 - 40 mg/dL   LDL Cholesterol 51 0 - 99 mg/dL    Comment:        Total Cholesterol/HDL:CHD Risk Coronary Heart Disease Risk Table                     Men   Women  1/2 Average Risk   3.4   3.3  Average Risk       5.0   4.4  2 X Average Risk   9.6   7.1  3 X Average Risk  23.4   11.0        Use the calculated Patient Ratio above and the CHD Risk Table to determine the patient's CHD Risk.        ATP III CLASSIFICATION (LDL):  <100     mg/dL   Optimal  540-086  mg/dL   Near or Above                    Optimal  130-159  mg/dL   Borderline  761-950  mg/dL   High  >932     mg/dL   Very High Performed at [facility] [address] [place], [place] [address]   TSH     Status: None   Collection Time: 11/24/19  6:41 AM  Result Value Ref Range   TSH 0.901 0.350 - 4.500 uIU/mL    Comment:  Performed by a 3rd Generation assay with a functional sensitivity of <=0.01 uIU/mL. Performed at [name], [address], [place], [place] [address]   Prolactin     Status: Abnormal   Collection Time: 11/24/19  6:41 AM  Result Value Ref Range   Prolactin 32.4 (H) 4.0 - 15.2 ng/mL    Comment: (NOTE) Performed At: [facility] [address] [place], [place] [id number] Jolene Schimke MD [id number]   Urinalysis, Routine w reflex microscopic     Status: Abnormal   Collection Time: 11/24/19  9:37 AM  Result Value Ref Range  Color, Urine AMBER (A) YELLOW    Comment: BIOCHEMICALS MAY BE AFFECTED BY COLOR   APPearance TURBID (A) CLEAR   Specific Gravity, Urine 1.034 (H) 1.005 - 1.030   pH 5.0 5.0 - 8.0   Glucose, UA NEGATIVE NEGATIVE mg/dL   Hgb urine dipstick NEGATIVE NEGATIVE   Bilirubin Urine NEGATIVE NEGATIVE   Ketones, ur 5 (A) NEGATIVE mg/dL   Protein, ur 30 (A) NEGATIVE mg/dL   Nitrite NEGATIVE NEGATIVE   Leukocytes,Ua NEGATIVE NEGATIVE   RBC / HPF 0-5 0 - 5 RBC/hpf   Bacteria, UA RARE (A) NONE SEEN   Mucus PRESENT    Amorphous Crystal PRESENT     Comment: Performed at [facility], [address], [place], [place] [address]  Urine rapid drug screen (hosp performed)not at [facility]     Status: None   Collection Time: 11/24/19  9:37 AM  Result Value Ref Range   Opiates NONE DETECTED NONE DETECTED   Cocaine NONE DETECTED NONE DETECTED   Benzodiazepines NONE DETECTED NONE DETECTED   Amphetamines NONE DETECTED NONE DETECTED   Tetrahydrocannabinol NONE DETECTED NONE DETECTED   Barbiturates NONE DETECTED NONE DETECTED    Comment: (NOTE) DRUG SCREEN FOR MEDICAL PURPOSES ONLY.  IF CONFIRMATION IS NEEDED FOR ANY PURPOSE, NOTIFY LAB WITHIN 5 DAYS. LOWEST DETECTABLE LIMITS FOR URINE DRUG SCREEN Drug Class                     Cutoff (ng/mL) Amphetamine and metabolites    1000 Barbiturate and metabolites    200 Benzodiazepine                  200 Tricyclics and metabolites     300 Opiates and metabolites        300 Cocaine and metabolites        300 THC                            50 Performed at [facility], [address] [place], [place] [address]     Blood Alcohol level:  Lab Results  Component Value Date   ETH <10 11/24/2019   ETH <10 08/19/2018    Metabolic Disorder Labs: Lab Results  Component Value Date   HGBA1C 5.1 11/24/2019   MPG 100 11/24/2019   MPG 99.67 07/06/2017   Lab Results  Component Value Date   PROLACTIN 32.4 (H) 11/24/2019   PROLACTIN 21.6 (H) 07/06/2017   Lab Results  Component Value Date   CHOL 119 11/24/2019   TRIG 143 11/24/2019   HDL 39 (L) 11/24/2019   CHOLHDL 3.1 11/24/2019   VLDL 29 11/24/2019   LDLCALC 51 11/24/2019   LDLCALC 91 07/06/2017    Physical Findings: AIMS:  , ,  ,  ,    CIWA:    COWS:     Musculoskeletal: Strength & Muscle Tone: within normal limits Gait & Station: normal Patient leans: N/A  Psychiatric Specialty Exam: Physical Exam  Review of Systems  Blood pressure 116/72, pulse 97, temperature 98.7 F (37.1 C), temperature source Oral, resp. rate 18, SpO2 96 %.There is no height or weight on file to calculate BMI.  General Appearance: Disheveled  Eye Contact:  Minimal  Speech:  Garbled  Volume:  Decreased  Mood:  Euthymic  Affect:  Blunt  Thought Process:  Disorganized and Descriptions of Associations: Circumstantial  Orientation:  Other:  Person place situation in general  Thought Content:  Illogical and Delusions  Suicidal Thoughts:  No  Homicidal Thoughts:  No  Memory:  Immediate;   Poor Recent;   Fair Remote;   Fair  Judgement:  Impaired  Insight:  Shallow  Psychomotor Activity:  Decreased  Concentration:  Concentration: Poor and Attention Span: Poor  Recall:  [employer] of Knowledge:  Fair  Language:  Fair  Akathisia:  Negative  Handed:  Right  AIMS (if indicated):     Assets:  Leisure  Time Physical Health Resilience  ADL's:  Intact  Cognition:  Impaired,  Mild  Sleep:  Number of Hours: 9.5   Treatment Plan Summary: Daily contact with patient to assess and evaluate symptoms and progress in treatment and Medication management  Continue Risperdal therapy for severe psychosis, continue lorazepam 1 more day continue temazepam for insomnia, and mood stabilizer therapy.  No change in precautions.  Continue reality based therapy.  Patient's psychosis is so prominent that I do not think gender identity issues should be explored in this psychotic condition  Malvin Johns, MD 11/25/2019, 12:51 PM

## 2019-11-25 NOTE — Progress Notes (Signed)
D: Pt denies SI/HI. Patient is in his room this evening. A: Pt was offered support and encouragement. Pt was given scheduled medications.  Q 15 minute checks were done for safety.  R:. Pt is taking medication. Pt has no complaints.Pt receptive to treatment and safety maintained on unit.

## 2019-11-25 NOTE — Progress Notes (Signed)
Recreation Therapy Notes  Date: 4.9.21 Time: 1000 Location: 500 Hall Dayroom Group Topic: Communication, Team Building, Problem Solving  Goal Area(s) Addresses:  Patient will effectively work with peer towards shared goal.  Patient will identify skills used to make activity successful.  Patient will identify how skills used during activity can be used to reach post d/c goals.   Behavioral Response: Minimal  Intervention: STEM Activity  Activity: Landing Pad. In teams patients were given 12 plastic drinking straws and a length of masking tape. Using the materials provided patients were asked to build a landing pad to catch a golf ball dropped from approximately 6 feet in the air.   Education: Pharmacist, community, Discharge Planning   Education Outcome: Acknowledges education/In group clarification offered/Needs additional education.   Clinical Observations/Feedback: Pt came in late to group but was engaged.  Pt observed for a few moments before jumping in to assist partner and nursing student.  Pt was quiet and soft spoken but would smile at times.    [name], LRT/CTRS    [name] A 11/25/2019 11:34 AM

## 2019-11-25 NOTE — [facility] Group Notes (Signed)
LCSW Wellness Group Note   11/25/2019 11:00am  Type of Group and Topic: Psychoeducational Group:  Wellness  Participation Level:  moderate  Description of Group  Wellness group introduces the topic and its focus on developing healthy habits across the spectrum and its relationship to a decrease in hospital admissions.  Six areas of wellness are discussed: physical, social spiritual, intellectual, occupational, and emotional.  Patients are asked to consider their current wellness habits and to identify areas of wellness where they are interested and able to focus on improvements.    Therapeutic Goals 1. Patients will understand components of wellness and how they can positively impact overall health.  2. Patients will identify areas of wellness where they have developed good habits. 3. Patients will identify areas of wellness where they would like to make improvements.    Summary of Patient Progress: Pt came to group late but did pay attention and participate.  Pt speaking softly, difficult to understand, but did agree to read from the handout and was reading accurately.  Pt shared that areas of wellness that he is doing well with currently is spiritual and he is doing poorly with physical, although he did report that he lifts weights in his home.       Therapeutic Modalities: Cognitive Behavioral Therapy Psychoeducation    Lorri Frederick, LCSW

## 2019-11-25 NOTE — Progress Notes (Signed)
Recreation Therapy Notes  INPATIENT RECREATION THERAPY ASSESSMENT  Patient Details Name: Collin White MRN: [id number] DOB: [birth year] Today's Date: 11/25/2019       Information Obtained From: Patient  Able to Participate in Assessment/Interview: Yes  Patient Presentation: Alert(Soft spoken)  Reason for Admission (Per Patient): Other (Comments)(Pt stated she was having body pain and it makes it hard to stand straight.)  Patient Stressors: (None identified)  Coping Skills:   Music, Talk, Art, Read, Hot Bath/Shower  Leisure Interests (2+):  Individual - Other (Comment)(Racing; Engineering)  Frequency of Recreation/Participation: Other (Comment)(Daily)  Awareness of Community Resources:  No  Expressed Interest in [employer] Information: No  County of Residence:  [place]  Patient Main Form of Transportation: Car  Patient Strengths:  Be around family and friends  Patient Identified Areas of Improvement:  "I don't know"  Patient Goal for Hospitalization:  "getting well"  Current SI (including self-harm):  No  Current HI:  No  Current AVH: No  Staff Intervention Plan: Group Attendance, Collaborate with Interdisciplinary Treatment Team  Consent to Intern Participation: N/A    [name], LRT/CTRS  [name] A 11/25/2019, 12:01 PM

## 2019-11-26 LAB — URINALYSIS, COMPLETE (UACMP) WITH MICROSCOPIC
Bacteria, UA: NONE SEEN
Bilirubin Urine: NEGATIVE
Glucose, UA: NEGATIVE mg/dL
Hgb urine dipstick: NEGATIVE
Ketones, ur: 5 mg/dL — AB
Leukocytes,Ua: NEGATIVE
Nitrite: NEGATIVE
Protein, ur: NEGATIVE mg/dL
Specific Gravity, Urine: 1.03 (ref 1.005–1.030)
pH: 6 (ref 5.0–8.0)

## 2019-11-26 MED ORDER — RISPERIDONE 2 MG PO TBDP
2.0000 mg | ORAL_TABLET | ORAL | Status: DC
  Filled 2019-11-26: qty 1

## 2019-11-26 MED ORDER — LORAZEPAM 1 MG PO TABS: 1.0000 mg | ORAL_TABLET | ORAL | Status: DC | PRN

## 2019-11-26 MED ORDER — PANTOPRAZOLE SODIUM 40 MG PO TBEC
40.0000 mg | DELAYED_RELEASE_TABLET | Freq: Every day | ORAL | Status: DC
  Administered 2019-11-26 – 2019-12-06 (×11): 40 mg via ORAL
  Filled 2019-11-26 (×12): qty 1
  Filled 2019-11-26: qty 7
  Filled 2019-11-26 (×2): qty 1

## 2019-11-26 MED ORDER — ZIPRASIDONE MESYLATE 20 MG IM SOLR: 20.0000 mg | INTRAMUSCULAR | Status: DC | PRN

## 2019-11-26 MED ORDER — BENZTROPINE MESYLATE 1 MG PO TABS
1.0000 mg | ORAL_TABLET | ORAL | Status: DC
  Filled 2019-11-26: qty 1

## 2019-11-26 MED ORDER — TEMAZEPAM 15 MG PO CAPS
15.0000 mg | ORAL_CAPSULE | Freq: Every day | ORAL | Status: DC
  Administered 2019-11-26 – 2019-12-05 (×10): 15 mg via ORAL
  Filled 2019-11-26 (×9): qty 1

## 2019-11-26 MED ORDER — OLANZAPINE 10 MG PO TBDP: 10.0000 mg | ORAL_TABLET | Freq: Three times a day (TID) | ORAL | Status: DC | PRN

## 2019-11-26 NOTE — Progress Notes (Signed)
   11/26/19 1000  Psych Admission Type (Psych Patients Only)  Admission Status Voluntary  Psychosocial Assessment  Patient Complaints None  Eye Contact Brief  Facial Expression Flat  Affect Flat  Interaction Guarded  Motor Activity Other (Comment) (WDL)  Appearance/Hygiene In hospital gown  Behavior Characteristics Cooperative  Mood Depressed  Thought Process  Coherency Blocking  Content Preoccupation  Judgment Poor  Confusion Mild  Danger to Self  Current suicidal ideation? Denies  Danger to Others  Danger to Others None reported or observed   Pt is mildly guarded with a flat affect. His speech is low and difficult to understand. Pt was encouraged to increase fluids and is taking medications as prescribed. Pt did report that his stomach was aching earlier today and he as given a scheduled protonix. Pt has been out in the dayroom and compliant with treatment plan. Safety maintained on the unit.

## 2019-11-26 NOTE — Progress Notes (Signed)
   11/26/19 2030  Psychosocial Assessment  Patient Complaints None  Eye Contact Brief  Facial Expression Flat  Affect Flat  Speech Soft;Incoherent  Interaction Guarded;Minimal  Motor Activity Other (Comment) (WDL)  Appearance/Hygiene In scrubs  Behavior Characteristics Appropriate to situation;Guarded  Thought Process  Coherency Blocking  Content Preoccupation  Delusions Paranoid  Perception Derealization  Hallucination None reported or observed  Judgment Poor  Confusion Mild  Danger to Self  Current suicidal ideation? Denies  Danger to Others  Danger to Others None reported or observed

## 2019-11-26 NOTE — [facility] Group Notes (Signed)
[name] Group Notes: (Clinical Social Work)   11/26/2019      Type of Therapy:  Group Therapy   Participation Level:  Did Not Attend - was invited individually by Nurse or MHT and chose not to attend.   Leisa Lenz, LCSWA 11/26/2019  12:45 PM

## 2019-11-26 NOTE — Progress Notes (Signed)
[facility] MD Progress Note  11/26/2019 11:45 AM Collin White  MRN:  [id number] Subjective: Patient is a 24 year old male with past psychiatric history significant for schizophrenia who [place]23[place]2019 actively hallucinating, disorganized and agitated.  He was brought to the emergency department for evaluation and stabilization.  Objective: Patient is seen and examined.  Patient is a 24 year old possible transgender male to male with a past psychiatric history significant for schizophrenia, intellectual disability, possible bipolar disorder, GERD who was admitted on 11/23/2019 secondary to symptoms of above.  He is seen in follow-up today.  As per the patients' request the patient is addressed as "Ms. February".  The patient remains significantly disorganized.  The patient mumbles on examination, and although some is understandable some is not.  It is noted that he had previously had mumbling, disorganized and delusional speech and thinking.  The patient did admit to auditory hallucinations today, and some of the comments appear to be hyper religious and paranoid in nature.  The patient's current medications include Cogentin, Depakote, lorazepam, Risperdal, temazepam and trazodone.  The patient's vital signs are stable.  Initially his pulse was 79 this a.m., went up to 127, but then came back down to 80.  The patient is afebrile.  Sleep was reported to be 6 hours.  The patient did complain of right-sided to central chest discomfort, and apparently has been diagnosed with GERD in the past.  Review of laboratories revealed mildly elevated sodium at 147, mildly elevated BUN at 25.  Creatinine was stable at 1.11.  The AST was elevated to 55, but ALT was normal at 42.  Lipids were normal.  CBC was essentially normal.  Prolactin was mildly elevated at 32.4, hemoglobin A1c was 5.1.  TSH was normal at 0.901.  Urinalysis was concentrated with a specific gravity 1.034.  There was no blood  present.  Screen was negative.  Chest x-ray on admission was normal.  Principal Problem: <principal problem not specified> Diagnosis: Active Problems:   Schizophrenia (HCC)  Total Time spent with patient: 20 minutes  Past Psychiatric History: See admission H&P  Past Medical History:  Past Medical History:  Diagnosis Date  . Migraines   . Psychoses (HCC)   . Schizophrenia (HCC)   . Seizures (HCC)     Past Surgical History:  Procedure Laterality Date  . abd surgery s/p traumatic event    . facial reconstructive surgery    . NO PAST SURGERIES  patient stated he had facial reconstruction surgery as a child   . 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 Hypertension Other   . Mental illness Neg Hx    Family Psychiatric  History: The admission H&P Social History:  Social History   Substance and Sexual Activity  Alcohol Use No     Social History   Substance and Sexual Activity  Drug Use No    Social History   Socioeconomic History  . Marital status: Single    Spouse name: Not on file  . Number of children: Not on file  . Years of education: Not on file  . Highest education level: Not on file  Occupational History  . Not on file  Tobacco Use  . Smoking status: Former Smoker    Types: Cigarettes  . Smokeless tobacco: Never Used  Substance and Sexual Activity  . Alcohol use: No  . Drug use: No  .  Sexual activity: Yes    Birth control/protection: Condom  Other Topics Concern  . Not on file  Social History Narrative   ** Merged History Encounter **       ** Merged History Encounter **       Social Determinants of Health   Financial Resource Strain:   . Difficulty of Paying Living Expenses:   Food Insecurity:   . Worried About Charity fundraiser in the Last Year:   . Arboriculturist in the Last Year:   Transportation Needs:   . Film/video editor (Medical):   Marland Kitchen Lack of Transportation  (Non-Medical):   Physical Activity:   . Days of Exercise per Week:   . Minutes of Exercise per Session:   Stress:   . Feeling of Stress :   Social Connections:   . Frequency of Communication with Friends and Family:   . Frequency of Social Gatherings with Friends and Family:   . Attends Religious Services:   . Active Member of Clubs or Organizations:   . Attends Archivist Meetings:   Marland Kitchen Marital Status:    Additional Social History:                         Sleep: Fair  Appetite:  Poor  Current Medications: Current Facility-Administered Medications  Medication Dose Route Frequency Provider Last Rate Last Admin  . acetaminophen (TYLENOL) tablet 650 mg  650 mg Oral Q6H PRN Connye Burkitt, NP      . alum & mag hydroxide-simeth (MAALOX/MYLANTA) 200-200-20 MG/5ML suspension 30 mL  30 mL Oral Q4H PRN Connye Burkitt, NP      . benztropine (COGENTIN) tablet 0.5 mg  0.5 mg Oral BID Johnn Hai, MD   0.5 mg at 11/26/19 0825  . divalproex (DEPAKOTE) DR tablet 500 mg  500 mg Oral QHS Johnn Hai, MD   500 mg at 11/25/19 2123  . hydrOXYzine (ATARAX/VISTARIL) tablet 25 mg  25 mg Oral TID PRN Connye Burkitt, NP      . magnesium hydroxide (MILK OF MAGNESIA) suspension 30 mL  30 mL Oral Daily PRN Connye Burkitt, NP      . pantoprazole (PROTONIX) EC tablet 40 mg  40 mg Oral Daily Sharma Covert, MD   40 mg at 11/26/19 1004  . risperiDONE (RISPERDAL) tablet 3 mg  3 mg Oral BID Johnn Hai, MD   3 mg at 11/26/19 0825  . temazepam (RESTORIL) capsule 30 mg  30 mg Oral QHS Johnn Hai, MD   30 mg at 11/25/19 2123  . traZODone (DESYREL) tablet 50 mg  50 mg Oral QHS PRN Connye Burkitt, NP   50 mg at 11/23/19 2103    Lab Results: No results found for this or any previous visit (from the past 48 hour(s)).  Blood Alcohol level:  Lab Results  Component Value Date   ETH <10 11/24/2019   ETH <10 63/14/9702    Metabolic Disorder Labs: Lab Results  Component Value Date    HGBA1C 5.1 11/24/2019   MPG 100 11/24/2019   MPG 99.67 07/06/2017   Lab Results  Component Value Date   PROLACTIN 32.4 (H) 11/24/2019   PROLACTIN 21.6 (H) 07/06/2017   Lab Results  Component Value Date   CHOL 119 11/24/2019   TRIG 143 11/24/2019   HDL 39 (L) 11/24/2019   CHOLHDL 3.1 11/24/2019   VLDL 29 11/24/2019   LDLCALC 51  11/24/2019   LDLCALC 91 07/06/2017    Physical Findings: AIMS: Facial and Oral Movements Muscles of Facial Expression: None, normal Lips and Perioral Area: None, normal Jaw: None, normal Tongue: None, normal,Extremity Movements Upper (arms, wrists, hands, fingers): None, normal Lower (legs, knees, ankles, toes): None, normal, Trunk Movements Neck, shoulders, hips: None, normal, Overall Severity Severity of abnormal movements (highest score from questions above): None, normal Incapacitation due to abnormal movements: None, normal Patient's awareness of abnormal movements (rate only patient's report): No Awareness, Dental Status Current problems with teeth and/or dentures?: No Does patient usually wear dentures?: No  CIWA:    COWS:     Musculoskeletal: Strength & Muscle Tone: within normal limits Gait & Station: shuffle Patient leans: N/A  Psychiatric Specialty Exam: Physical Exam  Nursing note and vitals reviewed. Constitutional: She is oriented to person, place, and time. She appears well-developed and well-nourished.  HENT:  Head: Normocephalic and atraumatic.  Respiratory: Effort normal.  Neurological: She is alert and oriented to person, place, and time.    Review of Systems  Blood pressure 97/69, pulse 80, temperature 98.2 F (36.8 C), temperature source Oral, resp. rate 18, SpO2 96 %.There is no height or weight on file to calculate BMI.  General Appearance: Disheveled  Eye Contact:  Fair  Speech:  Slow and Slurred  Volume:  Decreased  Mood:  Dysphoric  Affect:  Flat  Thought Process:  Disorganized and Descriptions of  Associations: Loose  Orientation:  Negative  Thought Content:  Delusions and Hallucinations: Auditory  Suicidal Thoughts:  No  Homicidal Thoughts:  No  Memory:  Immediate;   Poor Recent;   Poor Remote;   Poor  Judgement:  Impaired  Insight:  Lacking  Psychomotor Activity:  Psychomotor Retardation  Concentration:  Concentration: Fair and Attention Span: Fair  Recall:  [employer] of Knowledge:  Fair  Language:  Fair  Akathisia:  Negative  Handed:  Right  AIMS (if indicated):     Assets:  Desire for Improvement Resilience  ADL's:  Impaired  Cognition:  Impaired,  Mild  Sleep:  Number of Hours: 6     Treatment Plan Summary: Daily contact with patient to assess and evaluate symptoms and progress in treatment, Medication management and Plan : Patient is seen and examined.  Patient is a 24 year old transgender male to male with the above-stated past medical and psychiatric history who is seen in follow-up.   Diagnosis: #1 schizophrenia versus schizoaffective disorder, #2 reported history of intellectual disability, #3 mild dehydration, #4 right-sided chest pain, #5 GERD  Patient is seen in follow-up.  It is difficult to decide whether the patient is better with his current regimen and is mildly overmedicated from the Risperdal, or that the patient's illness is so significantly severe that this is at baseline.  The only change in the medicines that I am going to do today is to reduce the temazepam to 15 mg, and also add Protonix 40 mg p.o. daily.  I will order a Depakote level, liver function enzymes and a CBC tomorrow morning.  I will also write for nursing staff to force fluids to make sure that he is not overly dehydrated still.  1.  Continue Cogentin 0.5 mg p.o. twice daily for side effects of medications. 2.  Continue Depakote ER 500 mg p.o. nightly for mood stability. 3.  Continue hydroxyzine 25 mg p.o. 3 times daily as needed anxiety. 4.  Start Protonix 40 mg p.o. daily for  reflux and possible esophageal  spasm. 5.  Continue Risperdal 3 mg p.o. twice daily for psychosis. 6.  Decrease temazepam to 15 mg p.o. nightly for insomnia. 7.  Continue trazodone 50 mg p.o. nightly as needed insomnia. 8.  Depakote level, metabolic panel and CBC in a.m. tomorrow. 9.  Force fluids secondary to possible dehydration. 10.  Disposition planning-in progress.  Antonieta Pert, MD 11/26/2019, 11:45 AM

## 2019-11-26 NOTE — Progress Notes (Signed)
   11/26/19 2030  COVID-19 Daily Checkoff  Have you had a fever (temp > 37.80C/100F)  in the past 24 hours?  No  If you have had runny nose, nasal congestion, sneezing in the past 24 hours, has it worsened? No  COVID-19 EXPOSURE  Have you traveled outside the state in the past 14 days? No  Have you been in contact with someone with a confirmed diagnosis of COVID-19 or PUI in the past 14 days without wearing appropriate PPE? No  Have you been living in the same home as a person with confirmed diagnosis of COVID-19 or a PUI (household contact)? No  Have you been diagnosed with COVID-19? No

## 2019-11-26 NOTE — Progress Notes (Signed)
Adult Psychoeducational Group Note  Date:  11/26/2019 Time:  9:54 PM  Group Topic/Focus:  Wrap-Up Group:   The focus of this group is to help patients review their daily goal of treatment and discuss progress on daily workbooks.  Participation Level:  Minimal  Participation Quality:  Appropriate  Affect:  Depressed and Flat  Cognitive:  Appropriate  Insight: Limited  Engagement in Group:  Developing/Improving  Modes of Intervention:  Discussion  Additional Comments:  Pt stated his goal for today was to focus on his treatment plan. Pt stated he accomplished his goal today.Pt stated he felt about the same today as he did yesterday.. Pt rated his overall day a 1 out of 10. Writer asked pt to explain. Pt stated all he did was sleep all day. Pt stated his appetite was pretty good today. Pt stated his sleep last night was pretty good. Pt stated he was in no physical pain today.  Pt deny auditory or visual hallucinations. Pt denies thoughts of harming himself or others. Pt stated he would alert staff if anything changes.   Felipa Furnace 11/26/2019, 9:54 PM

## 2019-11-27 NOTE — Progress Notes (Signed)
   11/27/19 1945  COVID-19 Daily Checkoff  Have you had a fever (temp > 37.80C/100F)  in the past 24 hours?  No  COVID-19 EXPOSURE  Have you traveled outside the state in the past 14 days? No  Have you been in contact with someone with a confirmed diagnosis of COVID-19 or PUI in the past 14 days without wearing appropriate PPE? No  Have you been living in the same home as a person with confirmed diagnosis of COVID-19 or a PUI (household contact)? No  Have you been diagnosed with COVID-19? No

## 2019-11-27 NOTE — [facility] Group Notes (Signed)
Adult Psychoeducational Group Note  Date:  11/27/2019 Time:  12:15 PM  Group Topic/Focus:   PROGRESSIVE RELAXATION.Marland Kitchen A group where deep breathing is taught and tensing and relaxation muscle groups is used. Imagery is used with both deep breathing and tensing and relaxing of the muscle groups. Pt;s are asked to imagine 3 pillars that hold them up when they are not able to hold themselves up.  Participation Level:  Minimal  Participation Quality:  limited  Affect:  Flat  Cognitive:  Confused  Insight: Lacking  Engagement in Group:  Poor  Modes of Intervention:  Activity, Socialization and Support  Additional Comments:  Pt sat throughout the group and when he spoke, it was quite hard to hear his words. Stat during the progressive relaxation part and did not participate.  Vira Blanco A 11/27/2019, 12:15 PM

## 2019-11-27 NOTE — Progress Notes (Signed)
Pt seen interacting with other patients in the dayroom. Pt is disoriented and preoccupied with the idea that he is pregnant. His speech is soft and slurred, sometimes hard to comprehend what he's saying. When pt was asked for his date of birth, he said no one has a date of birth. Pt was compliant with medication administration. Pt denies SI/HI and auditory/visual hallucinations. Q 15 minute safety checks continue.

## 2019-11-27 NOTE — Progress Notes (Signed)
Pt went for lab draw and technician was unable get blood after several attempts.

## 2019-11-27 NOTE — [facility] Group Notes (Signed)
[facility] LCSW Group Therapy Note  Date/Time:  11/27/2019 11:15A-12:00P  Type of Therapy and Topic:  Group Therapy:  Healthy and Unhealthy Supports  Participation Level:  Active   Description of Group:  Patients in this group were introduced to the idea of adding a variety of healthy supports to address the various needs in their lives.Patients discussed what additional healthy supports could be helpful in their recovery and wellness after discharge in order to prevent future hospitalizations.   An emphasis was placed on using counselor, doctor, therapy groups, 12-step groups, and problem-specific support groups to expand supports.  They also worked as a group on developing a specific plan for several patients to deal with unhealthy supports through boundary-setting, psychoeducation with loved ones, and even termination of relationships.   Therapeutic Goals:   1)  discuss importance of adding supports to stay well once out of the hospital  2)  compare healthy versus unhealthy supports and identify some examples of each  3)  generate ideas and descriptions of healthy supports that can be added  4)  offer mutual support about how to address unhealthy supports  5)  encourage active participation in and adherence to discharge plan    Summary of Patient Progress:  The patient actively engaged in introduction check-in, sharing of feeling "Felt". Pt further continued to engaged in discussion surrounding supports, identifying supports to be "like a helicopter, hovering". Pt stated that current healthy supports in life are their uncle, while current unhealthy supports include "ones that try to trick and cheat you". Pt continued to speak softly throughout group, exhibiting tangential speech and bizarre thought content. Pt proved to acknowledge alternate input provided by group members and feedback given by CSW.  Therapeutic Modalities:   Motivational Interviewing Brief Solution-Focused  Therapy  Leisa Lenz, LCSWA 11/27/2019  2:05 PM

## 2019-11-27 NOTE — Progress Notes (Signed)
[facility] MD Progress Note  11/27/2019 10:57 AM Collin White  MRN:  [id number] Subjective:  Patient is a 24 year old male with past psychiatric history significant for schizophrenia who was [place] shopping [place] on 11[place]23/2019 actively hallucinating, disorganized and agitated.  He was brought to the emergency department for evaluation and stabilization.  Objective: Patient is seen and examined.  Patient is a 24 year old possible transgender male to male with a past psychiatric history significant for schizophrenia, intellectual disability, possible bipolar disorder, GERD who was admitted on 11/23/2019 with symptoms as stated above.  The patient appears to be actually better today.  Patient's speech is improved and clear.  Patient is not mumbling and having the low to symptoms.  The patient admitted to auditory hallucinations, but stated that they were not as loud as they had been.  The patient denied suicidal or homicidal ideation.  The patient stated that the chest discomfort that was reported yesterday had also improved.  Vital signs are stable, the patient is afebrile.  The patient reported 0 out of 10 pain this AM.  Sleep was recorded is 6.25 hours.  Urinalysis from 4/10 was essentially normal.  Principal Problem: <principal problem not specified> Diagnosis: Active Problems:   Schizophrenia (HCC)  Total Time spent with patient: 20 minutes  Past Psychiatric History: See admission H&P  Past Medical History:  Past Medical History:  Diagnosis Date  . Migraines   . Psychoses (HCC)   . Schizophrenia (HCC)   . Seizures (HCC)     Past Surgical History:  Procedure Laterality Date  . abd surgery s/p traumatic event    . facial reconstructive surgery    . NO PAST SURGERIES  patient stated he had facial reconstruction surgery as a child   . 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 Hypertension Other   . Mental illness Neg Hx     Family Psychiatric  History: See admission H&P Social History:  Social History   Substance and Sexual Activity  Alcohol Use No     Social History   Substance and Sexual Activity  Drug Use No    Social History   Socioeconomic History  . Marital status: Single    Spouse name: Not on file  . Number of children: Not on file  . Years of education: Not on file  . Highest education level: Not on file  Occupational History  . Not on file  Tobacco Use  . Smoking status: Former Smoker    Types: Cigarettes  . Smokeless tobacco: Never Used  Substance and Sexual Activity  . Alcohol use: No  . Drug use: No  . Sexual activity: Yes    Birth control/protection: Condom  Other Topics Concern  . Not on file  Social History Narrative   ** Merged History Encounter **       ** Merged History Encounter **       Social Determinants of Health   Financial Resource Strain:   . Difficulty of Paying Living Expenses:   Food Insecurity:   . Worried About Programme researcher, broadcasting/film/video in the Last Year:   . Barista in the Last Year:   Transportation Needs:   . Freight forwarder (Medical):   Marland Kitchen Lack of Transportation (Non-Medical):   Physical Activity:   . Days of Exercise per Week:   . Minutes of Exercise per Session:   Stress:   .  Feeling of Stress :   Social Connections:   . Frequency of Communication with Friends and Family:   . Frequency of Social Gatherings with Friends and Family:   . Attends Religious Services:   . Active Member of Clubs or Organizations:   . Attends Archivist Meetings:   Marland Kitchen Marital Status:    Additional Social History:                         Sleep: Good  Appetite:  Fair  Current Medications: Current Facility-Administered Medications  Medication Dose Route Frequency Provider Last Rate Last Admin  . acetaminophen (TYLENOL) tablet 650 mg  650 mg Oral Q6H PRN Connye Burkitt, NP      . alum & mag hydroxide-simeth (MAALOX/MYLANTA)  200-200-20 MG/5ML suspension 30 mL  30 mL Oral Q4H PRN Connye Burkitt, NP      . benztropine (COGENTIN) tablet 0.5 mg  0.5 mg Oral BID Johnn Hai, MD   0.5 mg at 11/27/19 0756  . divalproex (DEPAKOTE) DR tablet 500 mg  500 mg Oral QHS Johnn Hai, MD   500 mg at 11/26/19 2034  . hydrOXYzine (ATARAX/VISTARIL) tablet 25 mg  25 mg Oral TID PRN Connye Burkitt, NP      . OLANZapine zydis (ZYPREXA) disintegrating tablet 10 mg  10 mg Oral Q8H PRN Sharma Covert, MD       And  . LORazepam (ATIVAN) tablet 1 mg  1 mg Oral PRN Sharma Covert, MD       And  . ziprasidone (GEODON) injection 20 mg  20 mg Intramuscular PRN Sharma Covert, MD      . magnesium hydroxide (MILK OF MAGNESIA) suspension 30 mL  30 mL Oral Daily PRN Connye Burkitt, NP      . pantoprazole (PROTONIX) EC tablet 40 mg  40 mg Oral Daily Sharma Covert, MD   40 mg at 11/27/19 0756  . risperiDONE (RISPERDAL) tablet 3 mg  3 mg Oral BID Johnn Hai, MD   3 mg at 11/27/19 0756  . temazepam (RESTORIL) capsule 15 mg  15 mg Oral QHS Sharma Covert, MD   15 mg at 11/26/19 2035  . traZODone (DESYREL) tablet 50 mg  50 mg Oral QHS PRN Connye Burkitt, NP   50 mg at 11/23/19 2103    Lab Results:  Results for orders placed or performed during the hospital encounter of 11/23/19 (from the past 48 hour(s))  Urinalysis, Complete w Microscopic     Status: Abnormal   Collection Time: 11/26/19  9:31 AM  Result Value Ref Range   Color, Urine YELLOW YELLOW   APPearance CLEAR CLEAR   Specific Gravity, Urine 1.030 1.005 - 1.030   pH 6.0 5.0 - 8.0   Glucose, UA NEGATIVE NEGATIVE mg/dL   Hgb urine dipstick NEGATIVE NEGATIVE   Bilirubin Urine NEGATIVE NEGATIVE   Ketones, ur 5 (A) NEGATIVE mg/dL   Protein, ur NEGATIVE NEGATIVE mg/dL   Nitrite NEGATIVE NEGATIVE   Leukocytes,Ua NEGATIVE NEGATIVE   RBC / HPF 0-5 0 - 5 RBC/hpf   WBC, UA 0-5 0 - 5 WBC/hpf   Bacteria, UA NONE SEEN NONE SEEN   Mucus PRESENT    Ca Oxalate Crys, UA PRESENT      Comment: Performed at [facility], [place] [address], [place], [place] [address]    Blood Alcohol level:  Lab Results  Component Value Date  ETH <10 11/24/2019   ETH <10 08/19/2018    Metabolic Disorder Labs: Lab Results  Component Value Date   HGBA1C 5.1 11/24/2019   MPG 100 11/24/2019   MPG 99.67 07/06/2017   Lab Results  Component Value Date   PROLACTIN 32.4 (H) 11/24/2019   PROLACTIN 21.6 (H) 07/06/2017   Lab Results  Component Value Date   CHOL 119 11/24/2019   TRIG 143 11/24/2019   HDL 39 (L) 11/24/2019   CHOLHDL 3.1 11/24/2019   VLDL 29 11/24/2019   LDLCALC 51 11/24/2019   LDLCALC 91 07/06/2017    Physical Findings: AIMS: Facial and Oral Movements Muscles of Facial Expression: None, normal Lips and Perioral Area: None, normal Jaw: None, normal Tongue: None, normal,Extremity Movements Upper (arms, wrists, hands, fingers): None, normal Lower (legs, knees, ankles, toes): None, normal, Trunk Movements Neck, shoulders, hips: None, normal, Overall Severity Severity of abnormal movements (highest score from questions above): None, normal Incapacitation due to abnormal movements: None, normal Patient's awareness of abnormal movements (rate only patient's report): No Awareness, Dental Status Current problems with teeth and/or dentures?: No Does patient usually wear dentures?: No  CIWA:    COWS:     Musculoskeletal: Strength & Muscle Tone: within normal limits Gait & Station: normal Patient leans: N/A  Psychiatric Specialty Exam: Physical Exam  Nursing note and vitals reviewed. Constitutional: She is oriented to person, place, and time. She appears well-developed and well-nourished.  HENT:  Head: Normocephalic and atraumatic.  Respiratory: Effort normal.  Neurological: She is alert and oriented to person, place, and time.    Review of Systems  Blood pressure 118/75, pulse 93, temperature 98.1 F (36.7 C), temperature source Oral,  resp. rate 18, SpO2 96 %.There is no height or weight on file to calculate BMI.  General Appearance: Casual  Eye Contact:  Fair  Speech:  Normal Rate  Volume:  Decreased  Mood:  Anxious and Dysphoric  Affect:  Flat  Thought Process:  Goal Directed and Descriptions of Associations: Circumstantial  Orientation:  Full (Time, Place, and Person)  Thought Content:  Delusions and Hallucinations: Auditory  Suicidal Thoughts:  No  Homicidal Thoughts:  No  Memory:  Immediate;   Fair Recent;   Fair Remote;   Fair  Judgement:  Intact  Insight:  Fair  Psychomotor Activity:  Decreased  Concentration:  Concentration: Fair and Attention Span: Fair  Recall:  [employer] of Knowledge:  Fair  Language:  Fair  Akathisia:  Negative  Handed:  Right  AIMS (if indicated):     Assets:  Desire for Improvement Resilience  ADL's:  Intact  Cognition:  WNL  Sleep:  Number of Hours: 6.25     Treatment Plan Summary: Daily contact with patient to assess and evaluate symptoms and progress in treatment, Medication management and Plan : Patient is seen and examined.  Patient is a 24 year old transgender male to male with the above-stated past psychiatric history who is seen in follow-up.   Diagnosis: #1 schizophrenia versus schizoaffective disorder, #2 reported history of intellectual disability, #3 mild dehydration, #4 right-sided chest pain, #5 GERD  Patient is seen in follow-up.  The patient seems to be doing better.  The patient's speech pattern is more normal, less neurologic and disorganized.  The patient stated that the chest pain that had been felt was improving, and additionally auditory hallucinations were decreasing.  Patient is more awake and alert today and attending groups.  No change in current medications.  It does not appear  as though the lab came and drew laboratory work today for the valproic acid associated laboratories.  I will change that to the a.m. tomorrow.  No other change in current  medications.  1.  Continue Cogentin 0.5 mg p.o. twice daily for side effects of medications. 2.  Continue Depakote ER 500 mg p.o. nightly for mood stability. 3.  Continue hydroxyzine 25 mg p.o. 3 times daily as needed anxiety. 4.  Continue Protonix 40 mg p.o. daily for reflux and possible esophageal spasm. 5.  Continue Risperdal 3 mg p.o. twice daily for psychosis. 6.  Continue temazepam to 15 mg p.o. nightly for insomnia. 7.  Continue trazodone 50 mg p.o. nightly as needed insomnia. 8.  Reschedule Depakote level, metabolic panel and CBC in a.m. tomorrow. 9.  Force fluids secondary to possible dehydration. 10.  Disposition planning-in progress.  Antonieta Pert, MD 11/27/2019, 10:57 AM

## 2019-11-27 NOTE — Plan of Care (Signed)
Progress note  D: pt found in bed; compliant with medication administration. Pt has complaints of vomiting this morning. Pt is delusional thinking that their GI system is in their throat. Pt states the nausea was in their throat. Pt has been viewed in the dayroom but not interacting with peers. Pt denies any other physical complaints. Pt denies si/hi/ah/vh and verbally agrees to approach staff if these become apparent or before harming themself/others while at [facility].  A: Pt provided support and encouragement. Pt given medication per protocol and standing orders. Q71m safety checks implemented and continued.  R: Pt safe on the unit. Will continue to monitor.  Pt progressing in the following metrics  Problem: Education: Goal: Knowledge of [facility] Education information/materials will improve Outcome: Progressing Goal: Emotional status will improve Outcome: Progressing Goal: Mental status will improve Outcome: Progressing Goal: Verbalization of understanding the information provided will improve Outcome: Progressing

## 2019-11-27 NOTE — Progress Notes (Signed)
Adult Psychoeducational Group Note  Date:  11/27/2019 Time:  10:13 PM  Group Topic/Focus:  Wrap-Up Group:   The focus of this group is to help patients review their daily goal of treatment and discuss progress on daily workbooks.  Participation Level:  Minimal  Participation Quality:  Drowsy and Inattentive  Affect:  Depressed, Flat and Irritable  Cognitive:  Disorganized, Confused and Lacking  Insight: Lacking and Limited  Engagement in Group:  Lacking and Limited  Modes of Intervention:  Discussion  Additional Comments:    Pt stated her goal for today was to focus on her treatment plan. Pt stated she accomplished her goal today. Pt stated her relationship with her family has improved since she was admitted.Pt rated her overall day a 10. Pt stated her appetite was pretty good today. Pt stated her sleep last night was fair. Pt nurse was made aware of the situation. Pt stated she was in no physical pain today.  Pt deny auditory or visual hallucinations. Pt denies thoughts of harming herself or others. Pt stated she would alert staff if anything changes.  Felipa Furnace 11/27/2019, 10:13 PM

## 2019-11-28 LAB — COMPREHENSIVE METABOLIC PANEL
ALT: 16 U/L (ref 0–44)
AST: 18 U/L (ref 15–41)
Albumin: 3.8 g/dL (ref 3.5–5.0)
Alkaline Phosphatase: 69 U/L (ref 38–126)
Anion gap: 12 (ref 5–15)
BUN: 23 mg/dL — ABNORMAL HIGH (ref 6–20)
CO2: 23 mmol/L (ref 22–32)
Calcium: 9 mg/dL (ref 8.9–10.3)
Chloride: 107 mmol/L (ref 98–111)
Creatinine, Ser: 0.9 mg/dL (ref 0.61–1.24)
GFR calc Af Amer: >60 mL/min (ref >60)
GFR calc non Af Amer: >60 mL/min (ref >60)
Glucose, Bld: 96 mg/dL (ref 70–99)
Potassium: 3.7 mmol/L (ref 3.5–5.1)
Sodium: 142 mmol/L (ref 135–145)
Total Bilirubin: 0.3 mg/dL (ref 0.3–1.2)
Total Protein: 6.9 g/dL (ref 6.5–8.1)

## 2019-11-28 LAB — CBC WITH DIFFERENTIAL/PLATELET
Abs Immature Granulocytes: 0.02 10*3/uL (ref 0.00–0.07)
Basophils Absolute: 0 10*3/uL (ref 0.0–0.1)
Basophils Relative: 0 %
Eosinophils Absolute: 0.1 10*3/uL (ref 0.0–0.5)
Eosinophils Relative: 2 %
HCT: 38.5 % — ABNORMAL LOW (ref 39.0–52.0)
Hemoglobin: 12.6 g/dL — ABNORMAL LOW (ref 13.0–17.0)
Immature Granulocytes: 0 %
Lymphocytes Relative: 33 %
Lymphs Abs: 2.1 10*3/uL (ref 0.7–4.0)
MCH: 29.3 pg (ref 26.0–34.0)
MCHC: 32.7 g/dL (ref 30.0–36.0)
MCV: 89.5 fL (ref 80.0–100.0)
Monocytes Absolute: 0.6 10*3/uL (ref 0.1–1.0)
Monocytes Relative: 9 %
Neutro Abs: 3.6 10*3/uL (ref 1.7–7.7)
Neutrophils Relative %: 56 %
Platelets: 154 10*3/uL (ref 150–400)
RBC: 4.3 MIL/uL (ref 4.22–5.81)
RDW: 13.6 % (ref 11.5–15.5)
WBC: 6.4 10*3/uL (ref 4.0–10.5)
nRBC: 0 % (ref 0.0–0.2)

## 2019-11-28 LAB — VALPROIC ACID LEVEL: Valproic Acid Lvl: 20 ug/mL — ABNORMAL LOW (ref 50.0–100.0)

## 2019-11-28 NOTE — Plan of Care (Signed)
Progress note  D: pt found in bed; compliant with medication administration. Pt continues to be guarded and minimal but pleasant. Pt denied any delusions. Pt has rested most of the morning. Pt denies si/hi/ah/vh and verbally agrees to approach staff if these become apparent or before harming themself/others while at [facility].  A: Pt provided support and encouragement. Pt given medication per protocol and standing orders. Q83m safety checks implemented and continued.  R: Pt safe on the unit. Will continue to monitor.  Pt progressing in the following metrics  Problem: Coping: Goal: Ability to demonstrate self-control will improve Outcome: Progressing   Problem: Health Behavior/Discharge Planning: Goal: Identification of resources available to assist in meeting health care needs will improve Outcome: Progressing   Problem: Physical Regulation: Goal: Ability to maintain clinical measurements within normal limits will improve Outcome: Progressing

## 2019-11-28 NOTE — Progress Notes (Signed)
   11/28/19 2000  Psych Admission Type (Psych Patients Only)  Admission Status Voluntary  Psychosocial Assessment  Patient Complaints Anxiety;Disorientation  Eye Contact Fair  Facial Expression Blank;Flat  Affect Preoccupied  Speech Soft;Slow;Slurred  Interaction Avoidant;Cautious;Forwards little;Guarded  Motor Activity Slow  Appearance/Hygiene In scrubs  Behavior Characteristics Cooperative  Mood Suspicious;Anxious  Thought Process  Coherency Disorganized  Content Delusions;Magical thinking  Delusions Somatic  Perception Derealization  Hallucination None reported or observed  Judgment Poor  Confusion Mild  Danger to Self  Current suicidal ideation? Denies  Danger to Others  Danger to Others None reported or observed

## 2019-11-28 NOTE — [facility] Group Notes (Signed)
LCSW Aftercare Discharge Planning Group Note  11/28/2019   Type of Group and Topic: Psychoeducational Group: Discharge Planning  Participation Level: Did Not Attend  Description of Group  Discharge planning group reviews patient's anticipated discharge plans and assists patients to anticipate and address any barriers to wellness/recovery in the community. Suicide prevention education is reviewed with patients in group.  Therapeutic Goals  1. Patients will state their anticipated discharge plan and mental health aftercare  2. Patients will identify potential barriers to wellness in the community setting  3. Patients will engage in problem solving, solution focused discussion of ways to anticipate and address barriers to wellness/recovery  Summary of Patient Progress  Plan for Discharge/Comments:  Transportation Means:  Supports:  Therapeutic Modalities:  Motivational Interviewing  Hermine Feria C Eleny Cortez, LCSWA  11/28/2019 2:14 PM 

## 2019-11-28 NOTE — Progress Notes (Signed)
Recreation Therapy Notes  Date: 4.12.21 Time: 1000 Location: [address] Dayroom  Group Topic: Coping Skills  Goal Area(s) Addresses:  Patient will identify positive coping skills. Patient will identify benefit of using coping skills post d/c.  Behavioral Response: Engaged  Intervention: Game  Activity: Chartered loss adjuster.  LRT and patients played Jenga as the instructions described, in addition, patients were to identify a positive coping skill.  Patients and LRT could not repeat coping skills that had already been identify.  When the tower fell over, the game would restart and all coping skills were up for grabs.  Education: Pharmacologist, Building control surveyor.   Education Outcome: Acknowledges understanding/In group clarification offered/Needs additional education.   Clinical Observations/Feedback: Pt arrived late to group but joined right in with the activity.  Pt was soft spoken but engaged.  Pt identified some coping skills as writing music, playing an instrument and making an omelette.    [name], LRT/CTRS      [name] A 11/28/2019 11:38 AM
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
## 2019-11-29 MED ORDER — CELECOXIB 100 MG PO CAPS
200.0000 mg | ORAL_CAPSULE | Freq: Two times a day (BID) | ORAL | Status: DC
  Administered 2019-11-29 – 2019-12-06 (×14): 200 mg via ORAL
  Filled 2019-11-29: qty 1
  Filled 2019-11-29: qty 2
  Filled 2019-11-29 (×5): qty 1
  Filled 2019-11-29: qty 2
  Filled 2019-11-29 (×2): qty 1
  Filled 2019-11-29 (×2): qty 2
  Filled 2019-11-29: qty 1
  Filled 2019-11-29 (×2): qty 2
  Filled 2019-11-29 (×5): qty 1

## 2019-11-29 MED ORDER — MEMANTINE HCL 5 MG PO TABS
5.0000 mg | ORAL_TABLET | Freq: Two times a day (BID) | ORAL | Status: DC
  Administered 2019-11-29 – 2019-12-01 (×4): 5 mg via ORAL
  Filled 2019-11-29 (×6): qty 1

## 2019-11-29 MED ORDER — PRENATAL MULTIVITAMIN CH
1.0000 | ORAL_TABLET | Freq: Every day | ORAL | Status: DC
  Administered 2019-11-29 – 2019-12-06 (×8): 1 via ORAL
  Filled 2019-11-29 (×10): qty 1

## 2019-11-29 NOTE — Progress Notes (Signed)
   11/29/19 0617  Vital Signs  Temp 98.1 F (36.7 C)  Temp Source Oral  Pulse Rate 76  BP 121/65  BP Location Right Arm  BP Method Automatic  Patient Position (if appropriate) Sitting  D: Patient Presents with depressed mood and affect.  Patient was isolative in room.   Patient denies suicidal thoughts and self harming thoughts. A:  Patient took scheduled medicine.  Support and encouragement provided Routine safety checks conducted every 15 minutes. Patient  Informed to notify staff with any concerns.  Safety maintained.R:  No adverse drug reactions noted.  Patient contracts for safety.  Patient compliant with medication and treatment plan. Patient cooperative and calm. Safety maintained.
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
## 2019-11-29 NOTE — Progress Notes (Signed)
   11/29/19 2000  Psych Admission Type (Psych Patients Only)  Admission Status Voluntary  Psychosocial Assessment  Patient Complaints Anxiety  Eye Contact Fair  Facial Expression Blank;Flat  Affect Preoccupied  Speech Soft;Slow;Slurred  Interaction Avoidant;Cautious;Forwards little;Guarded  Motor Activity Slow  Appearance/Hygiene In scrubs  Behavior Characteristics Cooperative  Mood Preoccupied  Thought Process  Coherency Disorganized  Content Delusions;Magical thinking  Delusions Somatic  Perception Derealization  Hallucination None reported or observed  Judgment Poor  Confusion Mild  Danger to Self  Current suicidal ideation? Denies  Danger to Others  Danger to Others None reported or observed

## 2019-11-29 NOTE — Progress Notes (Signed)
Recreation Therapy Notes  Date: 4.13.21 Time: 1000 Location: [address] Hall Dayroom  Group Topic: Anxiety  Goal Area(s) Addresses:  Patient will identify triggers to anxiety. Patient will identify physical symptoms to anxiety. Patient will identify coping strategies for anxiety that can be used post d/c.  Intervention: Worksheet, pencils  Activity: Introduction to Anxiety.  Patients were to identify three things that trigger their anxiety, three physical symptoms they experience from anxiety, three thoughts they have from anxiety and coping strategies they use for anxiety.  Education: Communication, Discharge Planning  Education Outcome: Acknowledges understanding/In group clarification offered/Needs additional education.   Clinical Observations/Feedback: Pt did not attend group session.    [name], LRT/CTRS         [name] A 11/29/2019 11:33 AM

## 2019-11-29 NOTE — Progress Notes (Signed)
The patient verbalized in group that he learned an ability today but did not explain any further. He said that he went outside and that helped him to have a good day.

## 2019-11-30 DIAGNOSIS — F200 Paranoid schizophrenia: Secondary | ICD-10-CM | POA: Diagnosis not present
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
## 2019-11-30 NOTE — Tx Team (Signed)
Interdisciplinary Treatment and Diagnostic Plan Update  11/30/2019 Time of Session: 8:30am  Arslan Kier MRN: [id number]  Principal Diagnosis: <principal problem not specified>  Secondary Diagnoses: Active Problems:   Schizophrenia ([facility])   Current Medications:  Current Facility-Administered Medications  Medication Dose Route Frequency Provider Last Rate Last Admin  . acetaminophen (TYLENOL) tablet 650 mg  650 mg Oral Q6H PRN Aldean Baker, NP      . alum & mag hydroxide-simeth (MAALOX/MYLANTA) 200-200-20 MG/5ML suspension 30 mL  30 mL Oral Q4H PRN Aldean Baker, NP      . benztropine (COGENTIN) tablet 0.5 mg  0.5 mg Oral BID Malvin Johns, MD   0.5 mg at 11/30/19 0906  . celecoxib (CELEBREX) capsule 200 mg  200 mg Oral BID Malvin Johns, MD   200 mg at 11/30/19 0906  . divalproex (DEPAKOTE) DR tablet 500 mg  500 mg Oral QHS Malvin Johns, MD   500 mg at 11/29/19 2054  . hydrOXYzine (ATARAX/VISTARIL) tablet 25 mg  25 mg Oral TID PRN Aldean Baker, NP      . OLANZapine zydis (ZYPREXA) disintegrating tablet 10 mg  10 mg Oral Q8H PRN Antonieta Pert, MD       And  . LORazepam (ATIVAN) tablet 1 mg  1 mg Oral PRN Antonieta Pert, MD       And  . ziprasidone (GEODON) injection 20 mg  20 mg Intramuscular PRN Antonieta Pert, MD      . magnesium hydroxide (MILK OF MAGNESIA) suspension 30 mL  30 mL Oral Daily PRN Aldean Baker, NP      . memantine [name]) tablet 5 mg  5 mg Oral BID Malvin Johns, MD   5 mg at 11/30/19 0905  . pantoprazole (PROTONIX) EC tablet 40 mg  40 mg Oral Daily Antonieta Pert, MD   40 mg at 11/30/19 0906  . prenatal multivitamin tablet 1 tablet  1 tablet Oral Daily Malvin Johns, MD   1 tablet at 11/30/19 [phone]  . risperiDONE (RISPERDAL) tablet 3 mg  3 mg Oral BID Malvin Johns, MD   3 mg at 11/30/19 9924  . temazepam (RESTORIL) capsule 15 mg  15 mg Oral QHS Antonieta Pert, MD   15 mg at 11/29/19 2054  . traZODone (DESYREL) tablet 50 mg  50 mg Oral QHS PRN  Aldean Baker, NP   50 mg at 11/23/19 2103   PTA Medications: Medications Prior to Admission  Medication Sig Dispense Refill Last Dose  . ondansetron (ZOFRAN) 4 MG tablet Take 1 tablet (4 mg total) by mouth every 8 (eight) hours as needed for nausea or vomiting. 15 tablet 0     Patient Stressors: Financial difficulties Medication change or noncompliance Occupational concerns  Patient Strengths: Motivation for treatment/growth  Treatment Modalities: Medication Management, Group therapy, Case management,  1 to 1 session with clinician, Psychoeducation, Recreational therapy.   Physician Treatment Plan for Primary Diagnosis: <principal problem not specified> Long Term Goal(s): Improvement in symptoms so as ready for discharge Improvement in symptoms so as ready for discharge   Short Term Goals: Ability to identify changes in lifestyle to reduce recurrence of condition will improve Ability to verbalize feelings will improve Ability to disclose and discuss suicidal ideas Ability to demonstrate self-control will improve Ability to identify and develop effective coping behaviors will improve Ability to identify changes in lifestyle to reduce recurrence of condition will improve Ability to verbalize feelings will improve Ability to disclose and  discuss suicidal ideas Ability to demonstrate self-control will improve Ability to identify and develop effective coping behaviors will improve Ability to maintain clinical measurements within normal limits will improve  Medication Management: Evaluate patient's response, side effects, and tolerance of medication regimen.  Therapeutic Interventions: 1 to 1 sessions, Unit Group sessions and Medication administration.  Evaluation of Outcomes: Progressing  Physician Treatment Plan for Secondary Diagnosis: Active Problems:   Schizophrenia ([place])  Long Term Goal(s): Improvement in symptoms so as ready for discharge Improvement in symptoms so as  ready for discharge   Short Term Goals: Ability to identify changes in lifestyle to reduce recurrence of condition will improve Ability to verbalize feelings will improve Ability to disclose and discuss suicidal ideas Ability to demonstrate self-control will improve Ability to identify and develop effective coping behaviors will improve Ability to identify changes in lifestyle to reduce recurrence of condition will improve Ability to verbalize feelings will improve Ability to disclose and discuss suicidal ideas Ability to demonstrate self-control will improve Ability to identify and develop effective coping behaviors will improve Ability to maintain clinical measurements within normal limits will improve     Medication Management: Evaluate patient's response, side effects, and tolerance of medication regimen.  Therapeutic Interventions: 1 to 1 sessions, Unit Group sessions and Medication administration.  Evaluation of Outcomes: Progressing   RN Treatment Plan for Primary Diagnosis: <principal problem not specified> Long Term Goal(s): Knowledge of disease and therapeutic regimen to maintain health will improve  Short Term Goals: Ability to participate in decision making will improve, Ability to verbalize feelings will improve, Ability to disclose and discuss suicidal ideas, Ability to identify and develop effective coping behaviors will improve and Compliance with prescribed medications will improve  Medication Management: RN will administer medications as ordered by provider, will assess and evaluate patient's response and provide education to patient for prescribed medication. RN will report any adverse and/or side effects to prescribing provider.  Therapeutic Interventions: 1 on 1 counseling sessions, Psychoeducation, Medication administration, Evaluate responses to treatment, Monitor vital signs and CBGs as ordered, Perform/monitor CIWA, COWS, AIMS and Fall Risk screenings as ordered,  Perform wound care treatments as ordered.  Evaluation of Outcomes: Progressing   LCSW Treatment Plan for Primary Diagnosis: <principal problem not specified> Long Term Goal(s): Safe transition to appropriate next level of care at discharge, Engage patient in therapeutic group addressing interpersonal concerns.  Short Term Goals: Engage patient in aftercare planning with referrals and resources  Therapeutic Interventions: Assess for all discharge needs, 1 to 1 time with Social worker, Explore available resources and support systems, Assess for adequacy in community support network, Educate family and significant other(s) on suicide prevention, Complete Psychosocial Assessment, Interpersonal group therapy.  Evaluation of Outcomes: Progressing   Progress in Treatment: Attending groups: No.  Participating in groups: No. Taking medication as prescribed: Yes. Toleration medication: Yes. Family/Significant other contact made: Yes, individual(s) contacted:  pt declined  Patient understands diagnosis: No. Discussing patient identified problems/goals with staff: Yes. Medical problems stabilized or resolved: Yes. Denies suicidal/homicidal ideation: Yes. Issues/concerns per patient self-inventory: No. Other:   New problem(s) identified: No, Describe:  none  New Short Term/Long Term Goal(s): Medication stabilization, elimination of SI thoughts, and development of a comprehensive mental wellness plan.   Patient Goals:  "get my pain in stomach looked at"  Discharge Plan or Barriers: CSW will continue to follow up for appropriate referrals and possible discharge planning  Reason for Continuation of Hospitalization: Delusions  Hallucinations Medication stabilization  Estimated Length  of Stay: 1-2 days   Attendees: Patient:Collin White  11/30/2019   Physician: Dr. Jeannine Kitten, MD 11/30/2019   Nursing: Adela Ports., RN 11/30/2019   RN Care Manager: 11/30/2019   Social Worker: Daleen Squibb, LCSW   11/30/2019   Recreational Therapist:  11/30/2019   Other: Earlyne Iba, MSW intern  11/30/2019   Other:  11/30/2019   Other: 11/30/2019     Scribe for Treatment Team: Darreld Mclean, LCSWA 11/30/2019 9:55 AM

## 2019-11-30 NOTE — [facility] Group Notes (Signed)
LCSW Group Therapy Notes 11/30/2019 1:40 PM  Type of Therapy and Topic: Group Therapy: Overcoming Obstacles  Participation Level: Did Not Attend  Description of Group:  In this group patients will be encouraged to explore what they see as obstacles to their own wellness and recovery. They will be guided to discuss their thoughts, feelings, and behaviors related to these obstacles. The group will process together ways to cope with barriers, with attention given to specific choices patients can make. Each patient will be challenged to identify changes they are motivated to make in order to overcome their obstacles. This group will be process-oriented, with patients participating in exploration of their own experiences as well as giving and receiving support and challenge from other group members.  Therapeutic Goals: 1. Patient will identify personal and current obstacles as they relate to admission. 2. Patient will identify barriers that currently interfere with their wellness or overcoming obstacles.  3. Patient will identify feelings, thought process and behaviors related to these barriers. 4. Patient will identify two changes they are willing to make to overcome these obstacles:   Summary of Patient Progress    Therapeutic Modalities:  Cognitive Behavioral Therapy Solution Focused Therapy Motivational Interviewing Relapse Prevention Therapy  Enid Cutter, MSW, LCSWA 11/30/2019 1:40 PM

## 2019-11-30 NOTE — Progress Notes (Signed)
Pt presents with disorganized thinking and most of what pt says does not make sense.  Pt denied SI/HI/AVH.  Pt has been calm and cooperative.  RN administered medications and provided reassurance.  RN will continue to assess and provide support as needed.

## 2019-11-30 NOTE — Progress Notes (Signed)
Adult Psychoeducational Group Note  Date:  11/30/2019 Time:  9:09 PM  Group Topic/Focus:  Wrap-Up Group:   The focus of this group is to help patients review their daily goal of treatment and discuss progress on daily workbooks.  Participation Level:  Minimal  Participation Quality:  Sharing  Affect:  Appropriate  Cognitive:  Lacking  Insight: Lacking  Engagement in Group:  Engaged  Modes of Intervention:  Education and Support  Additional Comments:  Patient attended and participated in group tonight. He reports that he did nothing today.  Lita Mains [name] 11/30/2019, 9:09 PM

## 2019-11-30 NOTE — Progress Notes (Signed)
   11/30/19 2100  Psych Admission Type (Psych Patients Only)  Admission Status Voluntary  Psychosocial Assessment  Patient Complaints Anxiety  Eye Contact Fair  Facial Expression Blank;Flat  Affect Preoccupied  Speech Soft;Slow;Slurred  Interaction Avoidant;Cautious;Forwards little;Guarded  Motor Activity Slow  Appearance/Hygiene In scrubs  Behavior Characteristics Cooperative  Mood Suspicious  Thought Process  Coherency Disorganized;Flight of ideas  Content Delusions  Delusions Paranoid  Perception Derealization  Hallucination None reported or observed  Judgment Poor  Confusion Mild  Danger to Self  Current suicidal ideation? Denies  Danger to Others  Danger to Others None reported or observed

## 2019-12-01 DIAGNOSIS — F200 Paranoid schizophrenia: Secondary | ICD-10-CM | POA: Diagnosis not present

## 2019-12-01 MED ORDER — QUETIAPINE FUMARATE 50 MG PO TABS
50.0000 mg | ORAL_TABLET | Freq: Every day | ORAL | Status: DC
  Administered 2019-12-01 – 2019-12-05 (×5): 50 mg via ORAL
  Filled 2019-12-01 (×7): qty 1

## 2019-12-01 MED ORDER — MEMANTINE HCL 5 MG PO TABS
10.0000 mg | ORAL_TABLET | Freq: Two times a day (BID) | ORAL | Status: DC
  Administered 2019-12-01 – 2019-12-06 (×10): 10 mg via ORAL
  Filled 2019-12-01: qty 2
  Filled 2019-12-01 (×2): qty 1
  Filled 2019-12-01: qty 2
  Filled 2019-12-01 (×7): qty 1
  Filled 2019-12-01 (×3): qty 2
  Filled 2019-12-01: qty 1
  Filled 2019-12-01: qty 2

## 2019-12-01 NOTE — Plan of Care (Signed)
Progress note  D: pt found in bed; compliant with medication administration. Pt is guarded and minimal on approach. Pt denies any physical complaints. Pt's affect is flat/blank. Pt is pleasant though. Pt denies si/hi/ah/vh and verbally agrees to approach staff if these become apparent or before harming themself/others while at [facility].  A: Pt provided support and encouragement. Pt given medication per protocol and standing orders. Q64m safety checks implemented and continued.  R: Pt safe on the unit. Will continue to monitor.  Pt progressing in the following metrics  Problem: Safety: Goal: Periods of time without injury will increase Outcome: Progressing   Problem: Education: Goal: Ability to state activities that reduce stress will improve Outcome: Progressing   Problem: Coping: Goal: Ability to identify and develop effective coping behavior will improve Outcome: Progressing

## 2019-12-01 NOTE — Progress Notes (Signed)
   12/01/19 2100  Psych Admission Type (Psych Patients Only)  Admission Status Voluntary  Psychosocial Assessment  Patient Complaints Anxiety  Eye Contact Fair  Facial Expression Blank;Flat  Affect Anxious;Preoccupied  Speech Logical/coherent  Interaction Assertive;Forwards little  Motor Activity Slow  Appearance/Hygiene Poor hygiene;In scrubs  Behavior Characteristics Cooperative  Mood Anxious;Depressed;Preoccupied  Thought Process  Coherency Disorganized  Content Delusions  Delusions Paranoid  Perception Derealization  Hallucination None reported or observed  Judgment Poor  Confusion Mild  Danger to Self  Current suicidal ideation? Denies  Danger to Others  Danger to Others None reported or observed

## 2019-12-01 NOTE — Progress Notes (Signed)
[facility] MD Progress Note  12/01/2019 10:01 AM Collin White  MRN:  [id number] Subjective:    Patient continues to be disorganized in thought is not oriented to day date or time and states he lives in a [address]" and that is why he has no address when asked.  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 Diagnosis: Active Problems:   Schizophrenia (HCC)  Total Time spent with patient: 20 minutes  Past Psychiatric History: see eval  Past Medical History:  Past Medical History:  Diagnosis Date  . Migraines   . Psychoses (HCC)   . Schizophrenia (HCC)   . Seizures (HCC)     Past Surgical History:  Procedure Laterality Date  . abd surgery s/p traumatic event    . facial reconstructive surgery    . NO PAST SURGERIES  patient stated he had facial reconstruction surgery as a child   . 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 Hypertension Other   . Mental illness Neg Hx    Family Psychiatric  History: see eval Social History:  Social History   Substance and Sexual Activity  Alcohol Use No     Social History   Substance and Sexual Activity  Drug Use No    Social History   Socioeconomic History  . Marital status: Single    Spouse name: Not on file  . Number of children: Not on file  . Years of education: Not on file  . Highest education level: Not on file  Occupational History  . Not on file  Tobacco Use  . Smoking status: Former Smoker    Types: Cigarettes  . Smokeless tobacco: Never Used  Substance and Sexual Activity  . Alcohol use: No  . Drug use: No  . Sexual activity: Yes    Birth control/protection: Condom  Other Topics Concern  . Not on file  Social History Narrative   ** Merged History Encounter **       ** Merged History Encounter  **       Social Determinants of Health   Financial Resource Strain:   . Difficulty of Paying Living Expenses:   Food Insecurity:   . Worried About Programme researcher, broadcasting/film/video in the Last Year:   . Barista in the Last Year:   Transportation Needs:   . Freight forwarder (Medical):   Marland Kitchen Lack of Transportation (Non-Medical):   Physical Activity:   . Days of Exercise per Week:   . Minutes of Exercise per Session:   Stress:   . Feeling of Stress :   Social Connections:   . Frequency of Communication with Friends and Family:   . Frequency of Social Gatherings with Friends and Family:   . Attends Religious Services:   . Active Member of Clubs or Organizations:   . Attends Banker Meetings:   Marland Kitchen Marital Status:    Additional Social History:                         Sleep: Fair  Appetite:  Fair  Current Medications: Current Facility-Administered Medications  Medication Dose Route Frequency Provider Last Rate Last Admin  . acetaminophen (TYLENOL) tablet 650 mg  650 mg Oral Q6H PRN Aldean Baker, NP      .  alum & mag hydroxide-simeth (MAALOX/MYLANTA) 200-200-20 MG/5ML suspension 30 mL  30 mL Oral Q4H PRN Connye Burkitt, NP      . benztropine (COGENTIN) tablet 0.5 mg  0.5 mg Oral BID Johnn Hai, MD   0.5 mg at 12/01/19 0754  . celecoxib (CELEBREX) capsule 200 mg  200 mg Oral BID Johnn Hai, MD   200 mg at 12/01/19 0754  . divalproex (DEPAKOTE) DR tablet 500 mg  500 mg Oral QHS Johnn Hai, MD   500 mg at 11/30/19 2058  . hydrOXYzine (ATARAX/VISTARIL) tablet 25 mg  25 mg Oral TID PRN Connye Burkitt, NP      . OLANZapine zydis (ZYPREXA) disintegrating tablet 10 mg  10 mg Oral Q8H PRN Sharma Covert, MD       And  . LORazepam (ATIVAN) tablet 1 mg  1 mg Oral PRN Sharma Covert, MD       And  . ziprasidone (GEODON) injection 20 mg  20 mg Intramuscular PRN Sharma Covert, MD      . magnesium hydroxide (MILK OF MAGNESIA) suspension 30 mL  30 mL  Oral Daily PRN Connye Burkitt, NP      . memantine [facility]) tablet 10 mg  10 mg Oral BID Johnn Hai, MD      . pantoprazole (PROTONIX) EC tablet 40 mg  40 mg Oral Daily Sharma Covert, MD   40 mg at 12/01/19 0754  . prenatal multivitamin tablet 1 tablet  1 tablet Oral Daily Johnn Hai, MD   1 tablet at 12/01/19 0754  . risperiDONE (RISPERDAL) tablet 3 mg  3 mg Oral BID Johnn Hai, MD   3 mg at 12/01/19 0754  . temazepam (RESTORIL) capsule 15 mg  15 mg Oral QHS Sharma Covert, MD   15 mg at 11/30/19 2059  . traZODone (DESYREL) tablet 50 mg  50 mg Oral QHS PRN Connye Burkitt, NP   50 mg at 11/23/19 2103    Lab Results: No results found for this or any previous visit (from the past 48 hour(s)).  Blood Alcohol level:  Lab Results  Component Value Date   ETH <10 11/24/2019   ETH <10 67/34/1937    Metabolic Disorder Labs: Lab Results  Component Value Date   HGBA1C 5.1 11/24/2019   MPG 100 11/24/2019   MPG 99.67 07/06/2017   Lab Results  Component Value Date   PROLACTIN 32.4 (H) 11/24/2019   PROLACTIN 21.6 (H) 07/06/2017   Lab Results  Component Value Date   CHOL 119 11/24/2019   TRIG 143 11/24/2019   HDL 39 (L) 11/24/2019   CHOLHDL 3.1 11/24/2019   VLDL 29 11/24/2019   LDLCALC 51 11/24/2019   LDLCALC 91 07/06/2017    Physical Findings: AIMS: Facial and Oral Movements Muscles of Facial Expression: None, normal Lips and Perioral Area: None, normal Jaw: None, normal Tongue: None, normal,Extremity Movements Upper (arms, wrists, hands, fingers): None, normal Lower (legs, knees, ankles, toes): None, normal, Trunk Movements Neck, shoulders, hips: None, normal, Overall Severity Severity of abnormal movements (highest score from questions above): None, normal Incapacitation due to abnormal movements: None, normal Patient's awareness of abnormal movements (rate only patient's report): No Awareness, Dental Status Current problems with teeth and/or dentures?:  No Does patient usually wear dentures?: No  CIWA:    COWS:     Musculoskeletal: Strength & Muscle Tone: within normal limits Gait & Station: normal Patient leans: N/A  Psychiatric Specialty Exam: Physical Exam  Review of Systems  Blood pressure 131/83, pulse 98, temperature 97.8 F (36.6 C), temperature source Oral, resp. rate 18, SpO2 99 %.There is no height or weight on file to calculate BMI.  General Appearance: Disheveled  Eye Contact:  Poor  Speech:  Garbled, Slow and Slurred  Volume:  Decreased  Mood:  Dysphoric  Affect:  Congruent  Thought Process:  Disorganized  Orientation:  Other:  person situation  Thought Content:  Illogical and Delusions  Suicidal Thoughts:  No  Homicidal Thoughts:  No  Memory:  Immediate;   Poor Recent;   Poor Remote;   Poor  Judgement:  Impaired  Insight:  Shallow  Psychomotor Activity:  Normal  Concentration:  Concentration: Poor and Attention Span: Poor  Recall:  Poor  Fund of Knowledge:  Poor  Language:  Poor  Akathisia:  Negative  Handed:  Right  AIMS (if indicated):     Assets:  Physical Health Resilience  ADL's:  Impaired  Cognition:  Impaired,  Mild  Sleep:  Number of Hours: 6     Treatment Plan Summary: Daily contact with patient to assess and evaluate symptoms and progress in treatment and Medication management  Continue augmentation of antipsychotic therapy, add quetiapine at bedtime no change in precautions.  Malvin Johns, MD 12/01/2019, 10:01 AM

## 2019-12-02 DIAGNOSIS — F200 Paranoid schizophrenia: Secondary | ICD-10-CM | POA: Diagnosis not present

## 2019-12-02 MED ORDER — RISPERIDONE 3 MG PO TABS
6.0000 mg | ORAL_TABLET | Freq: Every day | ORAL | Status: DC
  Administered 2019-12-03 – 2019-12-05 (×3): 6 mg via ORAL
  Filled 2019-12-02 (×3): qty 2
  Filled 2019-12-02: qty 14
  Filled 2019-12-02 (×2): qty 2

## 2019-12-02 NOTE — Progress Notes (Signed)
   12/02/19 2130  COVID-19 Daily Checkoff  Have you had a fever (temp > 37.80C/100F)  in the past 24 hours?  No  If you have had runny nose, nasal congestion, sneezing in the past 24 hours, has it worsened? No  COVID-19 EXPOSURE  Have you traveled outside the state in the past 14 days? No  Have you been in contact with someone with a confirmed diagnosis of COVID-19 or PUI in the past 14 days without wearing appropriate PPE? No  Have you been living in the same home as a person with confirmed diagnosis of COVID-19 or a PUI (household contact)? No  Have you been diagnosed with COVID-19? No   

## 2019-12-02 NOTE — Progress Notes (Signed)
[facility] MD Progress Note  12/02/2019 9:50 AM Collin White  MRN:  [id number] Subjective:   Patient is a 24 year old homeless, disorganized schizophrenic, who continues to display a cluster of symptoms including thought disorder, lack of full orientation, and incoherent speech at times.  Today the patient is in bed complaining of "pain" the next few coherent sentences and states he has found no place to stay, and continues to make disjointed statements when they are somewhat coherent. Principal Problem: Disorganization of thought/lack of full orientation/chronic undertreated psychotic condition Diagnosis: Active Problems:   Schizophrenia (HCC)  Total Time spent with patient: 20 minutes  Past Psychiatric History: see eval  Past Medical History:  Past Medical History:  Diagnosis Date  . Migraines   . Psychoses (HCC)   . Schizophrenia (HCC)   . Seizures (HCC)     Past Surgical History:  Procedure Laterality Date  . abd surgery s/p traumatic event    . facial reconstructive surgery    . NO PAST SURGERIES  patient stated he had facial reconstruction surgery as a child   . 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 Hypertension Other   . Mental illness Neg Hx    Family Psychiatric  History: see eval Social History:  Social History   Substance and Sexual Activity  Alcohol Use No     Social History   Substance and Sexual Activity  Drug Use No    Social History   Socioeconomic History  . Marital status: Single    Spouse name: Not on file  . Number of children: Not on file  . Years of education: Not on file  . Highest education level: Not on file  Occupational History  . Not on file  Tobacco Use  . Smoking status: Former Smoker    Types: Cigarettes  . Smokeless tobacco: Never Used  Substance and Sexual Activity  . Alcohol use: No  . Drug use: No  . Sexual activity: Yes    Birth control/protection: Condom  Other  Topics Concern  . Not on file  Social History Narrative   ** Merged History Encounter **       ** Merged History Encounter **       Social Determinants of Health   Financial Resource Strain:   . Difficulty of Paying Living Expenses:   Food Insecurity:   . Worried About Programme researcher, broadcasting/film/video in the Last Year:   . Barista in the Last Year:   Transportation Needs:   . Freight forwarder (Medical):   Marland Kitchen Lack of Transportation (Non-Medical):   Physical Activity:   . Days of Exercise per Week:   . Minutes of Exercise per Session:   Stress:   . Feeling of Stress :   Social Connections:   . Frequency of Communication with Friends and Family:   . Frequency of Social Gatherings with Friends and Family:   . Attends Religious Services:   . Active Member of Clubs or Organizations:   . Attends Banker Meetings:   Marland Kitchen Marital Status:    Additional Social History:                         Sleep: Fair  Appetite:  Fair  Current Medications: Current Facility-Administered Medications  Medication Dose Route Frequency Provider Last Rate Last Admin  . acetaminophen (TYLENOL) tablet 650 mg  650 mg Oral Q6H PRN Aldean Baker, NP      . alum & mag hydroxide-simeth (MAALOX/MYLANTA) 200-200-20 MG/5ML suspension 30 mL  30 mL Oral Q4H PRN Aldean Baker, NP      . benztropine (COGENTIN) tablet 0.5 mg  0.5 mg Oral BID Malvin Johns, MD   0.5 mg at 12/02/19 0751  . celecoxib (CELEBREX) capsule 200 mg  200 mg Oral BID Malvin Johns, MD   200 mg at 12/02/19 0750  . divalproex (DEPAKOTE) DR tablet 500 mg  500 mg Oral QHS Malvin Johns, MD   500 mg at 12/01/19 2047  . hydrOXYzine (ATARAX/VISTARIL) tablet 25 mg  25 mg Oral TID PRN Aldean Baker, NP      . OLANZapine zydis (ZYPREXA) disintegrating tablet 10 mg  10 mg Oral Q8H PRN Antonieta Pert, MD       And  . LORazepam (ATIVAN) tablet 1 mg  1 mg Oral PRN Antonieta Pert, MD       And  . ziprasidone (GEODON) injection  20 mg  20 mg Intramuscular PRN Antonieta Pert, MD      . magnesium hydroxide (MILK OF MAGNESIA) suspension 30 mL  30 mL Oral Daily PRN Aldean Baker, NP      . memantine (NAMENDA) tablet 10 mg  10 mg Oral BID Malvin Johns, MD   10 mg at 12/02/19 0750  . pantoprazole (PROTONIX) EC tablet 40 mg  40 mg Oral Daily Antonieta Pert, MD   40 mg at 12/02/19 0750  . prenatal multivitamin tablet 1 tablet  1 tablet Oral Daily Malvin Johns, MD   1 tablet at 12/02/19 0751  . QUEtiapine (SEROQUEL) tablet 50 mg  50 mg Oral QHS Malvin Johns, MD   50 mg at 12/01/19 2047  . [START ON 12/03/2019] risperiDONE (RISPERDAL) tablet 6 mg  6 mg Oral QHS Malvin Johns, MD      . temazepam (RESTORIL) capsule 15 mg  15 mg Oral QHS Antonieta Pert, MD   15 mg at 12/01/19 2047  . traZODone (DESYREL) tablet 50 mg  50 mg Oral QHS PRN Aldean Baker, NP   50 mg at 11/23/19 2103    Lab Results: No results found for this or any previous visit (from the past 48 hour(s)).  Blood Alcohol level:  Lab Results  Component Value Date   ETH <10 11/24/2019   ETH <10 08/19/2018    Metabolic Disorder Labs: Lab Results  Component Value Date   HGBA1C 5.1 11/24/2019   MPG 100 11/24/2019   MPG 99.67 07/06/2017   Lab Results  Component Value Date   PROLACTIN 32.4 (H) 11/24/2019   PROLACTIN 21.6 (H) 07/06/2017   Lab Results  Component Value Date   CHOL 119 11/24/2019   TRIG 143 11/24/2019   HDL 39 (L) 11/24/2019   CHOLHDL 3.1 11/24/2019   VLDL 29 11/24/2019   LDLCALC 51 11/24/2019   LDLCALC 91 07/06/2017    Physical Findings: AIMS: Facial and Oral Movements Muscles of Facial Expression: None, normal Lips and Perioral Area: None, normal Jaw: None, normal Tongue: None, normal,Extremity Movements Upper (arms, wrists, hands, fingers): None, normal Lower (legs, knees, ankles, toes): None, normal, Trunk Movements Neck, shoulders, hips: None, normal, Overall Severity Severity of abnormal movements (highest score  from questions above): None, normal Incapacitation due to abnormal movements: None, normal Patient's awareness of abnormal movements (rate only patient's report): No Awareness, Dental Status Current problems with teeth and/or  dentures?: No Does patient usually wear dentures?: No  CIWA:    COWS:     Musculoskeletal: Strength & Muscle Tone: within normal limits Gait & Station: normal Patient leans: N/A  Psychiatric Specialty Exam: Physical Exam  Review of Systems  Blood pressure 120/75, pulse 76, temperature 97.8 F (36.6 C), temperature source Oral, resp. rate 18, SpO2 99 %.There is no height or weight on file to calculate BMI.  General Appearance: Casual  Eye Contact:  Minimal  Speech:  Garbled, Slow and Slurred  Volume:  Decreased  Mood:  Dysphoric  Affect:  Constricted  Thought Process:  Disorganized  Orientation:  Other:  Person and general situation not only delusional about the existence of some months  Thought Content:  Illogical and Delusions  Suicidal Thoughts:  No  Homicidal Thoughts:  No  Memory:  Immediate;   Poor Recent;   Poor Remote;   Poor  Judgement:  Impaired  Insight:  Lacking  Psychomotor Activity:  Decreased  Concentration:  Concentration: Poor and Attention Span: Poor  Recall:  Poor  Fund of Knowledge:  Poor  Language:  Poor  Akathisia:  Negative  Handed:  Right  AIMS (if indicated):     Assets:  Physical Health Resilience  ADL's:  Intact  Cognition:  WNL  Sleep:  Number of Hours: 8.25     Treatment Plan Summary: Daily contact with patient to assess and evaluate symptoms and progress in treatment and Medication management  Continue current regimen but stagger sedating meds at bedtime continue augmentation with Celebrex B vitamins and Namenda no change in precautions continue to seek housing  Emmit Oriley, MD 12/02/2019, 9:50 AM

## 2019-12-02 NOTE — Progress Notes (Signed)
SPIRITUALITY GROUP NOTE  Spirituality group facilitated by Wilkie Aye, MDiv, BCC.  Group Description:  Group focused on topic of hope.  Patients participated in facilitated discussion around topic, connecting with one another around experiences and definitions for hope.  Group members engaged with visual explorer photos, reflecting on what hope looks like for them today.  Group engaged in discussion around how their definitions of hope are present today in hospital.   Modalities: Psycho-social ed, Adlerian, Narrative, MI Patient Progress: Arrived late to group.  Was participatory when prompted by facilitator.  Spoke of finding hope in care for folks who are going through a dark time.  His engagement was tangential, but would return to topic.

## 2019-12-02 NOTE — Progress Notes (Signed)
   12/02/19 0900  Psych Admission Type (Psych Patients Only)  Admission Status Voluntary  Psychosocial Assessment  Patient Complaints None  Eye Contact Fair  Facial Expression Blank;Flat  Affect Anxious;Preoccupied  Speech Logical/coherent  Interaction Assertive;Forwards little  Motor Activity Slow  Appearance/Hygiene Poor hygiene;In scrubs  Behavior Characteristics Cooperative  Mood Preoccupied  Thought Process  Coherency Disorganized  Content Delusions  Delusions Paranoid  Perception Derealization  Hallucination None reported or observed  Judgment Poor  Confusion Mild  Danger to Self  Current suicidal ideation? Denies  Danger to Others  Danger to Others None reported or observed

## 2019-12-02 NOTE — [facility] Group Notes (Signed)
Balance In Life 12/02/2019 10:30am  Type of Therapy/Topic:  Group Therapy:  Balance in Life  Participation Level:  Did Not Attend  Description of Group:   This group will address the concept of balance and how it feels and looks when one is unbalanced. Patients will be encouraged to process areas in their lives that are out of balance and identify reasons for remaining unbalanced. Facilitators will guide patients in utilizing problem-solving interventions to address and correct the stressor making their life unbalanced. Understanding and applying boundaries will be explored and addressed for obtaining and maintaining a balanced life. Patients will be encouraged to explore ways to assertively make their unbalanced needs known to significant others in their lives, using other group members and facilitator for support and feedback.  Therapeutic Goals: 1. Patient will identify two or more emotions or situations they have that consume much of in their lives. 2. Patient will identify signs/triggers that life has become out of balance:  3. Patient will identify two ways to set boundaries in order to achieve balance in their lives:  4. Patient will demonstrate ability to communicate their needs through discussion and/or role plays  Summary of Patient Progress:    Therapeutic Modalities:   Cognitive Behavioral Therapy Solution-Focused Therapy Assertiveness Training  [name], LCSW  

## 2019-12-02 NOTE — Progress Notes (Signed)
   12/02/19 2130  COVID-19 Daily Checkoff  Have you had a fever (temp > 37.80C/100F)  in the past 24 hours?  No  If you have had runny nose, nasal congestion, sneezing in the past 24 hours, has it worsened? No  COVID-19 EXPOSURE  Have you traveled outside the state in the past 14 days? No  Have you been in contact with someone with a confirmed diagnosis of COVID-19 or PUI in the past 14 days without wearing appropriate PPE? No  Have you been living in the same home as a person with confirmed diagnosis of COVID-19 or a PUI (household contact)? No  Have you been diagnosed with COVID-19? No

## 2019-12-03 DIAGNOSIS — F201 Disorganized schizophrenia: Principal | ICD-10-CM

## 2019-12-03 NOTE — Progress Notes (Signed)
   12/03/19 2020  Psych Admission Type (Psych Patients Only)  Admission Status Voluntary  Psychosocial Assessment  Patient Complaints None  Eye Contact Fair  Facial Expression Blank;Flat  Affect Preoccupied  Speech Soft  Interaction Minimal  Motor Activity Slow  Appearance/Hygiene Poor hygiene;In scrubs  Behavior Characteristics Appropriate to situation  Mood Depressed;Sad;Preoccupied  Thought Process  Coherency Disorganized  Content Delusions  Delusions Paranoid  Perception Derealization  Hallucination None reported or observed  Judgment Poor  Confusion Mild  Danger to Self  Current suicidal ideation? Denies  Danger to Others  Danger to Others None reported or observed

## 2019-12-03 NOTE — Progress Notes (Signed)
   12/03/19 2020  COVID-19 Daily Checkoff  Have you had a fever (temp > 37.80C/100F)  in the past 24 hours?  No  If you have had runny nose, nasal congestion, sneezing in the past 24 hours, has it worsened? No  COVID-19 EXPOSURE  Have you traveled outside the state in the past 14 days? No  Have you been in contact with someone with a confirmed diagnosis of COVID-19 or PUI in the past 14 days without wearing appropriate PPE? No  Have you been living in the same home as a person with confirmed diagnosis of COVID-19 or a PUI (household contact)? No  Have you been diagnosed with COVID-19? No

## 2019-12-03 NOTE — Progress Notes (Signed)
Pt denies SI/HI/AVH.  Pt presents with incomprehensible speech and delusional thinking.  Pt is calm and appears to be in no significant distress.  RN administered pt's medications as prescribed and pt took medications without difficulty.  No side effects were noted.  RN will continue to monitor and provide support as needed.

## 2019-12-03 NOTE — Progress Notes (Signed)
Adult Psychoeducational Group Note  Date:  12/03/2019 Time:  9:59 PM  Group Topic/Focus:  Wrap-Up Group:   The focus of this group is to help patients review their daily goal of treatment and discuss progress on daily workbooks.  Participation Level:  Minimal  Participation Quality:  Drowsy, Inattentive and Resistant  Affect:  Flat and Irritable  Cognitive:  Disorganized, Confused and Lacking  Insight: Lacking and Limited  Engagement in Group:  Lacking and Limited  Modes of Intervention:  Discussion  Additional Comments:  Pt stated his goal for today was to focus on his treatment plan. Pt stated he accomplished his goal today. Pt stated he felt better about himself today. Pt rated his overall day a 10. Pt stated his appetite was pretty good today. Pt stated his sleep last night was good. Pt stated he was in no physical pain today.  Pt deny auditory or visual hallucinations. Pt denies thoughts of harming himself or others. Pt stated he would alert staff if anything changes.   Felipa Furnace 12/03/2019, 9:59 PM

## 2019-12-03 NOTE — Progress Notes (Signed)
[address] [name] MD Progress Note  12/03/2019 10:58 AM Collin White  MRN:  [id number] Subjective:  "I'm ok."  Patient found lying in bed. He provides more appropriate answers to questions and less pressured speech. He remains tangential and incoherent at times. He is oriented to person and place but states the year is 4000 and the month is December. He no longer speaks about his "castle." He states he has housing but becomes vague when asked to elaborate. He shows no signs of responding to internal stimuli. He denies any SI/HI/AVH. No agitated or disruptive behaviors on the unit. He is medication compliant. Denies medication side effects.  From admission H&P: This is the latest of multiple healthcare system encounters/admissions for this 24 year old homelessness individual, who presents once again in a disorganized state of mind.  This is typical of this individual's psychotic exacerbations.  Apparently there is chronic noncompliance. The patient was found [place] shopping [place], actively hallucinating somewhat disorganized and brought in by [name] police.   Principal Problem: <principal problem not specified> Diagnosis: Active Problems:   Schizophrenia (HCC)  Total Time spent with patient: 15 minutes  Past Psychiatric History: See admission H&P  Past Medical History:  Past Medical History:  Diagnosis Date  . Migraines   . Psychoses (HCC)   . Schizophrenia (HCC)   . Seizures (HCC)     Past Surgical History:  Procedure Laterality Date  . abd surgery s/p traumatic event    . facial reconstructive surgery    . NO PAST SURGERIES  patient stated he had facial reconstruction surgery as a child   . 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 Hypertension Other   . Mental illness Neg Hx    Family Psychiatric  History: See admission H&P Social History:  Social History   Substance and Sexual Activity  Alcohol Use No     Social History    Substance and Sexual Activity  Drug Use No    Social History   Socioeconomic History  . Marital status: Single    Spouse name: Not on file  . Number of children: Not on file  . Years of education: Not on file  . Highest education level: Not on file  Occupational History  . Not on file  Tobacco Use  . Smoking status: Former Smoker    Types: Cigarettes  . Smokeless tobacco: Never Used  Substance and Sexual Activity  . Alcohol use: No  . Drug use: No  . Sexual activity: Yes    Birth control/protection: Condom  Other Topics Concern  . Not on file  Social History Narrative   ** Merged History Encounter **       ** Merged History Encounter **       Social Determinants of Health   Financial Resource Strain:   . Difficulty of Paying Living Expenses:   Food Insecurity:   . Worried About Programme researcher, broadcasting/film/video in the Last Year:   . Barista in the Last Year:   Transportation Needs:   . Freight forwarder (Medical):   Marland Kitchen Lack of Transportation (Non-Medical):   Physical Activity:   . Days of Exercise per Week:   . Minutes of Exercise per Session:   Stress:   . Feeling of Stress :   Social Connections:   . Frequency of Communication with Friends and Family:   . Frequency of Social Gatherings with  Friends and Family:   . Attends Religious Services:   . Active Member of Clubs or Organizations:   . Attends Banker Meetings:   Marland Kitchen Marital Status:    Additional Social History:                         Sleep: Good  Appetite:  Good  Current Medications: Current Facility-Administered Medications  Medication Dose Route Frequency Provider Last Rate Last Admin  . acetaminophen (TYLENOL) tablet 650 mg  650 mg Oral Q6H PRN Aldean Baker, NP      . alum & mag hydroxide-simeth (MAALOX/MYLANTA) 200-200-20 MG/5ML suspension 30 mL  30 mL Oral Q4H PRN Aldean Baker, NP      . benztropine (COGENTIN) tablet 0.5 mg  0.5 mg Oral BID Malvin Johns, MD   0.5  mg at 12/03/19 0835  . celecoxib (CELEBREX) capsule 200 mg  200 mg Oral BID Malvin Johns, MD   200 mg at 12/03/19 0835  . divalproex (DEPAKOTE) DR tablet 500 mg  500 mg Oral QHS Malvin Johns, MD   500 mg at 12/02/19 2122  . hydrOXYzine (ATARAX/VISTARIL) tablet 25 mg  25 mg Oral TID PRN Aldean Baker, NP      . OLANZapine zydis (ZYPREXA) disintegrating tablet 10 mg  10 mg Oral Q8H PRN Antonieta Pert, MD       And  . LORazepam (ATIVAN) tablet 1 mg  1 mg Oral PRN Antonieta Pert, MD       And  . ziprasidone (GEODON) injection 20 mg  20 mg Intramuscular PRN Antonieta Pert, MD      . magnesium hydroxide (MILK OF MAGNESIA) suspension 30 mL  30 mL Oral Daily PRN Aldean Baker, NP      . memantine [facility]) tablet 10 mg  10 mg Oral BID Malvin Johns, MD   10 mg at 12/03/19 0835  . pantoprazole (PROTONIX) EC tablet 40 mg  40 mg Oral Daily Antonieta Pert, MD   40 mg at 12/03/19 0835  . prenatal multivitamin tablet 1 tablet  1 tablet Oral Daily Malvin Johns, MD   1 tablet at 12/03/19 [phone]  . QUEtiapine (SEROQUEL) tablet 50 mg  50 mg Oral QHS Malvin Johns, MD   50 mg at 12/02/19 2122  . risperiDONE (RISPERDAL) tablet 6 mg  6 mg Oral QHS Malvin Johns, MD      . temazepam (RESTORIL) capsule 15 mg  15 mg Oral QHS Antonieta Pert, MD   15 mg at 12/02/19 2123  . traZODone (DESYREL) tablet 50 mg  50 mg Oral QHS PRN Aldean Baker, NP   50 mg at 12/02/19 2122    Lab Results: No results found for this or any previous visit (from the past 48 hour(s)).  Blood Alcohol level:  Lab Results  Component Value Date   ETH <10 11/24/2019   ETH <10 08/19/2018    Metabolic Disorder Labs: Lab Results  Component Value Date   HGBA1C 5.1 11/24/2019   MPG 100 11/24/2019   MPG 99.67 07/06/2017   Lab Results  Component Value Date   PROLACTIN 32.4 (H) 11/24/2019   PROLACTIN 21.6 (H) 07/06/2017   Lab Results  Component Value Date   CHOL 119 11/24/2019   TRIG 143 11/24/2019   HDL 39 (L)  11/24/2019   CHOLHDL 3.1 11/24/2019   VLDL 29 11/24/2019   LDLCALC 51 11/24/2019   LDLCALC 91 07/06/2017  Physical Findings: AIMS: Facial and Oral Movements Muscles of Facial Expression: None, normal Lips and Perioral Area: None, normal Jaw: None, normal Tongue: None, normal,Extremity Movements Upper (arms, wrists, hands, fingers): None, normal Lower (legs, knees, ankles, toes): None, normal, Trunk Movements Neck, shoulders, hips: None, normal, Overall Severity Severity of abnormal movements (highest score from questions above): None, normal Incapacitation due to abnormal movements: None, normal Patient's awareness of abnormal movements (rate only patient's report): No Awareness, Dental Status Current problems with teeth and/or dentures?: No Does patient usually wear dentures?: No  CIWA:    COWS:     Musculoskeletal: Strength & Muscle Tone: within normal limits Gait & Station: normal Patient leans: N/A  Psychiatric Specialty Exam: Physical Exam  Nursing note and vitals reviewed. Constitutional: She appears well-developed and well-nourished.  Cardiovascular: Normal rate.  Respiratory: Effort normal.  Neurological: She is alert.    Review of Systems  Constitutional: Negative.   Respiratory: Negative for cough and shortness of breath.   Psychiatric/Behavioral: Positive for confusion. Negative for agitation, behavioral problems, dysphoric mood, self-injury, sleep disturbance and suicidal ideas. The patient is not nervous/anxious and is not hyperactive.     Blood pressure (!) 119/91, pulse (!) 106, temperature 98.4 F (36.9 C), temperature source Oral, resp. rate 18, SpO2 99 %.There is no height or weight on file to calculate BMI.  General Appearance: Disheveled  Eye Contact:  Good  Speech:  Normal Rate  Volume:  Normal  Mood:  Euthymic  Affect:  Congruent  Thought Process:  Disorganized  Orientation:  Other:  oriented to person and place, not time  Thought Content:   Delusions  Suicidal Thoughts:  No  Homicidal Thoughts:  No  Memory:  Immediate;   Poor Recent;   Poor  Judgement:  Poor  Insight:  Lacking  Psychomotor Activity:  Normal  Concentration:  Concentration: Fair and Attention Span: Fair  Recall:  Poor  Fund of Knowledge:  Poor  Language:  Fair  Akathisia:  No  Handed:  Right  AIMS (if indicated):     Assets:  Leisure Time Resilience  ADL's:  Intact  Cognition:  WNL  Sleep:  Number of Hours: 6.75     Treatment Plan Summary: Daily contact with patient to assess and evaluate symptoms and progress in treatment and Medication management   Continue inpatient hospitalization.  Continue Risperdal 6 mg PO QHS for psychosis Continue Seroquel 50 gm PO QHS for psychosis/sleep Continue Depakote 500 mg PO QHS for mood instability Continue Cogentin 0.5 mg PO BID for EPS Continue Celebrex 200 mg PO BID for pain Continue Vistaril 25 mg PO TID PRN anxiety Continue Namenda 10 mg PO BID for memory loss Continue Protonix 40 mg PO daily for GERD Continue Restoril 15 mg PO QHS for insomnia Continue agitation protocol PRN agitation  Patient will participate in the therapeutic group milieu.  Discharge disposition in progress.   Connye Burkitt, NP 12/03/2019, 10:58 AM

## 2019-12-04 DIAGNOSIS — F201 Disorganized schizophrenia: Secondary | ICD-10-CM | POA: Diagnosis not present

## 2019-12-04 MED ORDER — RISPERIDONE 2 MG PO TABS
2.0000 mg | ORAL_TABLET | Freq: Every morning | ORAL | Status: DC
  Administered 2019-12-04 – 2019-12-06 (×3): 2 mg via ORAL
  Filled 2019-12-04 (×6): qty 1

## 2019-12-04 NOTE — Progress Notes (Signed)
Patient was out in the dayroom briefly tonight for snacks. Writer spoke with him 1:1 and he was rubbing his stomach. Writer asked if he was in pain and he replied "its just the babies moving." Patient is compliant with his medications but is still delusional but pleasant. Safety maintained with 15 min checks.

## 2019-12-04 NOTE — Progress Notes (Addendum)
Adult Psychoeducational Group Note  Date:  12/04/2019 Time:  10:27 PM  Group Topic/Focus:  Wrap-Up Group:   The focus of this group is to help patients review their daily goal of treatment and discuss progress on daily workbooks.  Participation Level:  Minimal  Participation Quality:  Appropriate and Drowsy  Affect:  Flat and Irritable  Cognitive:  Disorganized and Confused  Insight: Lacking and Limited  Engagement in Group:  Lacking and Limited  Modes of Intervention:  Discussion  Additional Comments: Pt stated his goal for today was to focus on his treatment plan. Pt stated he accomplished his goal today. Pt rated his overall day a 9 out of 10. Pt stated his appetite was pretty good today. Pt stated his sleep last night was pretty good. Pt stated he was in physical pain today. Writer asked pt where was his pain located. Pt stated he had pain on the left side of his stomach. Pt's nurse was made aware of the situation. Pt rated his pain level at a 6, on a scale of 1-10.  Pt deny auditory or visual hallucinations. Pt denies thoughts of harming himself or others. Pt stated he would alert staff if anything changes.      Felipa Furnace 12/04/2019, 10:27 PM

## 2019-12-04 NOTE — Progress Notes (Signed)
[facility] MD Progress Note  12/04/2019 9:15 AM Collin White  MRN:  [id number] Subjective:  "I'm going through pregnancy stages."  Patient found lying in bed. He is able to answer questions appropriately at times but continues to present with delusional thought content and incoherent, mumbling speech. This morning he states that he is going through the stages of pregnancy and lost a baby overnight. When asked if he slept overnight, he states "I don't know." He denies SI/HI/AVH. He is homeless and when discharge planning is discussed, he states he is going [place]."  From admission H&P: This is the latest of multiple healthcare system encounters/admissions for this 24 year old homelessness individual, who presents once again in a disorganized state of mind. This is typical of this individual's psychotic exacerbations. Apparently there is chronic noncompliance. The patient was found at a local shopping center, actively hallucinating somewhat disorganized and brought in by [facility] police.   Principal Problem: <principal problem not specified> Diagnosis: Active Problems:   Schizophrenia (HCC)  Total Time spent with patient: 15 minutes  Past Psychiatric History: See admission H&P  Past Medical History:  Past Medical History:  Diagnosis Date  . Migraines   . Psychoses (HCC)   . Schizophrenia (HCC)   . Seizures (HCC)     Past Surgical History:  Procedure Laterality Date  . abd surgery s/p traumatic event    . facial reconstructive surgery    . NO PAST SURGERIES  patient stated he had facial reconstruction surgery as a child   . 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 Hypertension Other   . Mental illness Neg Hx    Family Psychiatric  History: See admission H&P Social History:  Social History   Substance and Sexual Activity  Alcohol Use No     Social History   Substance and Sexual Activity  Drug Use No     Social History   Socioeconomic History  . Marital status: Single    Spouse name: Not on file  . Number of children: Not on file  . Years of education: Not on file  . Highest education level: Not on file  Occupational History  . Not on file  Tobacco Use  . Smoking status: Former Smoker    Types: Cigarettes  . Smokeless tobacco: Never Used  Substance and Sexual Activity  . Alcohol use: No  . Drug use: No  . Sexual activity: Yes    Birth control/protection: Condom  Other Topics Concern  . Not on file  Social History Narrative   ** Merged History Encounter **       ** Merged History Encounter **       Social Determinants of Health   Financial Resource Strain:   . Difficulty of Paying Living Expenses:   Food Insecurity:   . Worried About Programme researcher, broadcasting/film/video in the Last Year:   . Barista in the Last Year:   Transportation Needs:   . Freight forwarder (Medical):   Marland Kitchen Lack of Transportation (Non-Medical):   Physical Activity:   . Days of Exercise per Week:   . Minutes of Exercise per Session:   Stress:   . Feeling of Stress :   Social Connections:   . Frequency of Communication with Friends and Family:   . Frequency of Social Gatherings with Friends and Family:   . Attends Religious Services:   .  Active Member of Clubs or Organizations:   . Attends Archivist Meetings:   Marland Kitchen Marital Status:    Additional Social History:                         Sleep: Good  Appetite:  Good  Current Medications: Current Facility-Administered Medications  Medication Dose Route Frequency Provider Last Rate Last Admin  . acetaminophen (TYLENOL) tablet 650 mg  650 mg Oral Q6H PRN Connye Burkitt, NP      . alum & mag hydroxide-simeth (MAALOX/MYLANTA) 200-200-20 MG/5ML suspension 30 mL  30 mL Oral Q4H PRN Connye Burkitt, NP      . benztropine (COGENTIN) tablet 0.5 mg  0.5 mg Oral BID Johnn Hai, MD   0.5 mg at 12/04/19 0809  . celecoxib (CELEBREX)  capsule 200 mg  200 mg Oral BID Johnn Hai, MD   200 mg at 12/04/19 0809  . divalproex (DEPAKOTE) DR tablet 500 mg  500 mg Oral QHS Johnn Hai, MD   500 mg at 12/03/19 2235  . hydrOXYzine (ATARAX/VISTARIL) tablet 25 mg  25 mg Oral TID PRN Connye Burkitt, NP   25 mg at 12/03/19 2235  . OLANZapine zydis (ZYPREXA) disintegrating tablet 10 mg  10 mg Oral Q8H PRN Sharma Covert, MD       And  . LORazepam (ATIVAN) tablet 1 mg  1 mg Oral PRN Sharma Covert, MD       And  . ziprasidone (GEODON) injection 20 mg  20 mg Intramuscular PRN Sharma Covert, MD      . magnesium hydroxide (MILK OF MAGNESIA) suspension 30 mL  30 mL Oral Daily PRN Connye Burkitt, NP      . memantine [facility]) tablet 10 mg  10 mg Oral BID Johnn Hai, MD   10 mg at 12/04/19 6503  . pantoprazole (PROTONIX) EC tablet 40 mg  40 mg Oral Daily Sharma Covert, MD   40 mg at 12/04/19 0809  . prenatal multivitamin tablet 1 tablet  1 tablet Oral Daily Johnn Hai, MD   1 tablet at 12/04/19 0809  . QUEtiapine (SEROQUEL) tablet 50 mg  50 mg Oral QHS Johnn Hai, MD   50 mg at 12/03/19 2242  . risperiDONE (RISPERDAL) tablet 6 mg  6 mg Oral QHS Johnn Hai, MD   6 mg at 12/03/19 2242  . temazepam (RESTORIL) capsule 15 mg  15 mg Oral QHS Sharma Covert, MD   15 mg at 12/03/19 2235  . traZODone (DESYREL) tablet 50 mg  50 mg Oral QHS PRN Connye Burkitt, NP   50 mg at 12/02/19 2122    Lab Results: No results found for this or any previous visit (from the past 48 hour(s)).  Blood Alcohol level:  Lab Results  Component Value Date   ETH <10 11/24/2019   ETH <10 54/65/6812    Metabolic Disorder Labs: Lab Results  Component Value Date   HGBA1C 5.1 11/24/2019   MPG 100 11/24/2019   MPG 99.67 07/06/2017   Lab Results  Component Value Date   PROLACTIN 32.4 (H) 11/24/2019   PROLACTIN 21.6 (H) 07/06/2017   Lab Results  Component Value Date   CHOL 119 11/24/2019   TRIG 143 11/24/2019   HDL 39 (L) 11/24/2019    CHOLHDL 3.1 11/24/2019   VLDL 29 11/24/2019   LDLCALC 51 11/24/2019   LDLCALC 91 07/06/2017    Physical Findings: AIMS:  Facial and Oral Movements Muscles of Facial Expression: None, normal Lips and Perioral Area: None, normal Jaw: None, normal Tongue: None, normal,Extremity Movements Upper (arms, wrists, hands, fingers): None, normal Lower (legs, knees, ankles, toes): None, normal, Trunk Movements Neck, shoulders, hips: None, normal, Overall Severity Severity of abnormal movements (highest score from questions above): None, normal Incapacitation due to abnormal movements: None, normal Patient's awareness of abnormal movements (rate only patient's report): No Awareness, Dental Status Current problems with teeth and/or dentures?: No Does patient usually wear dentures?: No  CIWA:    COWS:     Musculoskeletal: Strength & Muscle Tone: within normal limits Gait & Station: normal Patient leans: N/A  Psychiatric Specialty Exam: Physical Exam  Nursing note and vitals reviewed. Constitutional: She appears well-developed.  Cardiovascular: Normal rate.  Respiratory: Effort normal.  Neurological: She is alert.    Review of Systems  Constitutional: Negative.   Respiratory: Negative for cough and shortness of breath.   Psychiatric/Behavioral: Positive for confusion. Negative for agitation, behavioral problems, dysphoric mood, hallucinations, self-injury, sleep disturbance and suicidal ideas. The patient is not nervous/anxious and is not hyperactive.     Blood pressure 112/76, pulse (!) 106, temperature 98.2 F (36.8 C), temperature source Oral, resp. rate 18, SpO2 99 %.There is no height or weight on file to calculate BMI.  General Appearance: Disheveled  Eye Contact:  Good  Speech:  Normal Rate  Volume:  Normal  Mood:  Euthymic  Affect:  Congruent  Thought Process:  Disorganized  Orientation:  Other:  oriented to person and place, not time  Thought Content:  Delusions  Suicidal  Thoughts:  No  Homicidal Thoughts:  No  Memory:  Immediate;   Poor Recent;   Poor  Judgement:  Impaired  Insight:  Lacking  Psychomotor Activity:  Decreased  Concentration:  Concentration: Poor and Attention Span: Poor  Recall:  Poor  Fund of Knowledge:  Poor  Language:  Poor  Akathisia:  No  Handed:  Right  AIMS (if indicated):     Assets:  Leisure Time Resilience  ADL's:  Intact  Cognition:  WNL  Sleep:  Number of Hours: 6     Treatment Plan Summary: Daily contact with patient to assess and evaluate symptoms and progress in treatment and Medication management   Continue inpatient hospitalization.  Increase Risperdal to 2 mg PO QAM, 6 mg PO QHS for psychosis Continue Seroquel 50 mg PO QHS for psychosis/sleep Continue Depakote 500 mg PO QHS for mood instability Continue Cogentin 0.5 mg PO BID for EPS Continue Celebrex 200 mg PO BID for pain Continue Vistaril 25 mg PO TID PRN anxiety Continue Namenda 10 mg PO BID for memory loss Continue Protonix 40 mg PO daily for GERD Continue Restoril 15 mg PO QHS for insomnia Continue agitation protocol PRN agitation  Patient will participate in the therapeutic group milieu.  Discharge disposition in progress.   Aldean Baker, NP 12/04/2019, 9:15 AM

## 2019-12-04 NOTE — Progress Notes (Signed)
   12/04/19 2140  COVID-19 Daily Checkoff  Have you had a fever (temp > 37.80C/100F)  in the past 24 hours?  No  If you have had runny nose, nasal congestion, sneezing in the past 24 hours, has it worsened? No  COVID-19 EXPOSURE  Have you traveled outside the state in the past 14 days? No  Have you been in contact with someone with a confirmed diagnosis of COVID-19 or PUI in the past 14 days without wearing appropriate PPE? No  Have you been living in the same home as a person with confirmed diagnosis of COVID-19 or a PUI (household contact)? No  Have you been diagnosed with COVID-19? No

## 2019-12-04 NOTE — Plan of Care (Signed)
Progress note  D: pt found in bed; compliant with medication administration. Pt denied any physical complaints. Pt continues to ramble and speaks incoherently at time. Pt has been reclusive to their room most of the day. Pt denies si/hi/ah/vh and verbally agrees to approach staff if these become apparent or before harming themself/others while at [facility].  A: Pt provided support and encouragement. Pt given medication per protocol and standing orders. Q104m safety checks implemented and continued.  R: Pt safe on the unit. Will continue to monitor.  Pt progressing in the following metrics  Problem: Activity: Goal: Will verbalize the importance of balancing activity with adequate rest periods Outcome: Progressing   Problem: Education: Goal: Knowledge of the prescribed therapeutic regimen will improve Outcome: Progressing   Problem: Coping: Goal: Coping ability will improve Outcome: Progressing

## 2019-12-05 DIAGNOSIS — F200 Paranoid schizophrenia: Secondary | ICD-10-CM | POA: Diagnosis not present

## 2019-12-05 NOTE — Progress Notes (Signed)
[name] MD Progress Note  12/05/2019 9:56 AM Collin White  MRN:  [id number] Subjective:  Patient seen and examined. States is feeling well, though is "sleeping too good". Patient endorses daytime sleepiness starting from yesterday. Notably disorganized thinking seen. Speech is slowed, mumbled, and low. Patient is oriented to person and place but not time; giving present month as "December" and year "4945". Delusions of residing in [place] following discharge, and death of month 2022-12-17 still present. Patient denies SI/HI/AVH.   Principal Problem: <principal problem not specified> Diagnosis: Active Problems:   Schizophrenia (HCC)  Total Time spent with patient: 15 minutes  Past Psychiatric History: see admission H&P  Past Medical History:  Past Medical History:  Diagnosis Date  . Migraines   . Psychoses (HCC)   . Schizophrenia (HCC)   . Seizures (HCC)     Past Surgical History:  Procedure Laterality Date  . abd surgery s/p traumatic event    . facial reconstructive surgery    . NO PAST SURGERIES  patient stated he had facial reconstruction surgery as a child   . 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 Hypertension Other   . Mental illness Neg Hx    Family Psychiatric  History: see admission H&P Social History:  Social History   Substance and Sexual Activity  Alcohol Use No     Social History   Substance and Sexual Activity  Drug Use No    Social History   Socioeconomic History  . Marital status: Single    Spouse name: Not on file  . Number of children: Not on file  . Years of education: Not on file  . Highest education level: Not on file  Occupational History  . Not on file  Tobacco Use  . Smoking status: Former Smoker    Types: Cigarettes  . Smokeless tobacco: Never Used  Substance and Sexual Activity  . Alcohol use: No  . Drug use: No  . Sexual activity: Yes    Birth control/protection: Condom  Other  Topics Concern  . Not on file  Social History Narrative   ** Merged History Encounter **       ** Merged History Encounter **       Social Determinants of Health   Financial Resource Strain:   . Difficulty of Paying Living Expenses:   Food Insecurity:   . Worried About Programme researcher, broadcasting/film/video in the Last Year:   . Barista in the Last Year:   Transportation Needs:   . Freight forwarder (Medical):   Marland Kitchen Lack of Transportation (Non-Medical):   Physical Activity:   . Days of Exercise per Week:   . Minutes of Exercise per Session:   Stress:   . Feeling of Stress :   Social Connections:   . Frequency of Communication with Friends and Family:   . Frequency of Social Gatherings with Friends and Family:   . Attends Religious Services:   . Active Member of Clubs or Organizations:   . Attends Banker Meetings:   Marland Kitchen Marital Status:    Additional Social History:                         Sleep: Good  Appetite:  Good  Current Medications: Current Facility-Administered Medications  Medication Dose Route Frequency Provider Last Rate Last Admin  . acetaminophen (TYLENOL) tablet 650  mg  650 mg Oral Q6H PRN Aldean Baker, NP      . alum & mag hydroxide-simeth (MAALOX/MYLANTA) 200-200-20 MG/5ML suspension 30 mL  30 mL Oral Q4H PRN Aldean Baker, NP      . benztropine (COGENTIN) tablet 0.5 mg  0.5 mg Oral BID Malvin Johns, MD   0.5 mg at 12/05/19 0756  . celecoxib (CELEBREX) capsule 200 mg  200 mg Oral BID Malvin Johns, MD   200 mg at 12/05/19 0755  . divalproex (DEPAKOTE) DR tablet 500 mg  500 mg Oral QHS Malvin Johns, MD   500 mg at 12/04/19 2130  . hydrOXYzine (ATARAX/VISTARIL) tablet 25 mg  25 mg Oral TID PRN Aldean Baker, NP   25 mg at 12/03/19 2235  . OLANZapine zydis (ZYPREXA) disintegrating tablet 10 mg  10 mg Oral Q8H PRN Antonieta Pert, MD       And  . LORazepam (ATIVAN) tablet 1 mg  1 mg Oral PRN Antonieta Pert, MD       And  .  ziprasidone (GEODON) injection 20 mg  20 mg Intramuscular PRN Antonieta Pert, MD      . magnesium hydroxide (MILK OF MAGNESIA) suspension 30 mL  30 mL Oral Daily PRN Aldean Baker, NP      . memantine [facility]) tablet 10 mg  10 mg Oral BID Malvin Johns, MD   10 mg at 12/05/19 0756  . pantoprazole (PROTONIX) EC tablet 40 mg  40 mg Oral Daily Antonieta Pert, MD   40 mg at 12/05/19 0756  . prenatal multivitamin tablet 1 tablet  1 tablet Oral Daily Malvin Johns, MD   1 tablet at 12/05/19 0756  . QUEtiapine (SEROQUEL) tablet 50 mg  50 mg Oral QHS Malvin Johns, MD   50 mg at 12/04/19 2130  . risperiDONE (RISPERDAL) tablet 2 mg  2 mg Oral q AM Aldean Baker, NP   2 mg at 12/05/19 0756  . risperiDONE (RISPERDAL) tablet 6 mg  6 mg Oral QHS Malvin Johns, MD   6 mg at 12/04/19 2129  . temazepam (RESTORIL) capsule 15 mg  15 mg Oral QHS Antonieta Pert, MD   15 mg at 12/04/19 2130  . traZODone (DESYREL) tablet 50 mg  50 mg Oral QHS PRN Aldean Baker, NP   50 mg at 12/02/19 2122    Lab Results: No results found for this or any previous visit (from the past 48 hour(s)).  Blood Alcohol level:  Lab Results  Component Value Date   ETH <10 11/24/2019   ETH <10 08/19/2018    Metabolic Disorder Labs: Lab Results  Component Value Date   HGBA1C 5.1 11/24/2019   MPG 100 11/24/2019   MPG 99.67 07/06/2017   Lab Results  Component Value Date   PROLACTIN 32.4 (H) 11/24/2019   PROLACTIN 21.6 (H) 07/06/2017   Lab Results  Component Value Date   CHOL 119 11/24/2019   TRIG 143 11/24/2019   HDL 39 (L) 11/24/2019   CHOLHDL 3.1 11/24/2019   VLDL 29 11/24/2019   LDLCALC 51 11/24/2019   LDLCALC 91 07/06/2017    Physical Findings: AIMS: Facial and Oral Movements Muscles of Facial Expression: None, normal Lips and Perioral Area: None, normal Jaw: None, normal Tongue: None, normal,Extremity Movements Upper (arms, wrists, hands, fingers): None, normal Lower (legs, knees, ankles, toes): None,  normal, Trunk Movements Neck, shoulders, hips: None, normal, Overall Severity Severity of abnormal movements (highest score from  questions above): None, normal Incapacitation due to abnormal movements: None, normal Patient's awareness of abnormal movements (rate only patient's report): No Awareness, Dental Status Current problems with teeth and/or dentures?: No Does patient usually wear dentures?: No  CIWA:    COWS:     Musculoskeletal: Strength & Muscle Tone: within normal limits Gait & Station: normal Patient leans: N/A  Psychiatric Specialty Exam: Physical Exam  Constitutional: She appears well-developed and well-nourished. No distress.  Neurological: She is alert.  Skin: She is not diaphoretic.  Psychiatric: She has a normal mood and affect. Her behavior is normal.    Review of Systems  All other systems reviewed and are negative.   Blood pressure 112/76, pulse (!) 106, temperature 98.2 F (36.8 C), temperature source Oral, resp. rate 18, SpO2 99 %.There is no height or weight on file to calculate BMI.  General Appearance: Disheveled  Eye Contact:  Fair  Speech:  Slow  Volume:  Decreased  Mood:  Euthymic  Affect:  Congruent  Thought Process:  Disorganized  Orientation:  Other:  Place and Person, not Time  Thought Content:  Delusions  Suicidal Thoughts:  No  Homicidal Thoughts:  No  Memory:  Immediate;   Fair Recent;   Fair Remote;   Fair  Judgement:  Impaired  Insight:  Lacking  Psychomotor Activity:  Decreased  Concentration:  Concentration: Fair and Attention Span: Fair  Recall:  [employer] of Knowledge:  Poor  Language:  Poor  Akathisia:  No  Handed:  Right  AIMS (if indicated):     Assets:  Leisure Time Resilience  ADL's:  Intact  Cognition:  WNL  Sleep:  Number of Hours: 6.75     Treatment Plan Summary: Daily contact with patient to assess and evaluate symptoms and progress in treatment, Medication management and Plan Continue medication regimen.  Plan for discharge.   Janne Lab, Medical Student 12/05/2019, 9:56 AM

## 2019-12-05 NOTE — Progress Notes (Signed)
   12/05/19 2000  Psych Admission Type (Psych Patients Only)  Admission Status Voluntary  Psychosocial Assessment  Patient Complaints None  Eye Contact Avoids  Facial Expression Flat  Affect Appropriate to circumstance  Speech Soft;Tangential  Interaction Minimal;Guarded;Superficial  Motor Activity Slow  Appearance/Hygiene In scrubs;Body odor  Behavior Characteristics Cooperative  Mood Suspicious;Preoccupied  Thought Process  Coherency Disorganized  Content Delusions  Delusions Paranoid  Perception Depersonalization;Hallucinations  Hallucination None reported or observed  Judgment Poor  Confusion Mild  Danger to Self  Current suicidal ideation? Denies  Danger to Others  Danger to Others None reported or observed  Danger to Others Abnormal  Harmful Behavior to others No threats or harm toward other people  Destructive Behavior No threats or harm toward property

## 2019-12-05 NOTE — Tx Team (Signed)
Interdisciplinary Treatment and Diagnostic Plan Update  12/05/2019 Time of Session: 8:30am  Collin White MRN: [id number]  Principal Diagnosis: <principal problem not specified>  Secondary Diagnoses: Active Problems:   Schizophrenia ([place])   Current Medications:  Current Facility-Administered Medications  Medication Dose Route Frequency Provider Last Rate Last Admin  . acetaminophen (TYLENOL) tablet 650 mg  650 mg Oral Q6H PRN Connye Burkitt, NP      . alum & mag hydroxide-simeth (MAALOX/MYLANTA) 200-200-20 MG/5ML suspension 30 mL  30 mL Oral Q4H PRN Connye Burkitt, NP      . benztropine (COGENTIN) tablet 0.5 mg  0.5 mg Oral BID Johnn Hai, MD   0.5 mg at 12/05/19 0756  . celecoxib (CELEBREX) capsule 200 mg  200 mg Oral BID Johnn Hai, MD   200 mg at 12/05/19 0755  . divalproex (DEPAKOTE) DR tablet 500 mg  500 mg Oral QHS Johnn Hai, MD   500 mg at 12/04/19 2130  . hydrOXYzine (ATARAX/VISTARIL) tablet 25 mg  25 mg Oral TID PRN Connye Burkitt, NP   25 mg at 12/03/19 2235  . OLANZapine zydis (ZYPREXA) disintegrating tablet 10 mg  10 mg Oral Q8H PRN Sharma Covert, MD       And  . LORazepam (ATIVAN) tablet 1 mg  1 mg Oral PRN Sharma Covert, MD       And  . ziprasidone (GEODON) injection 20 mg  20 mg Intramuscular PRN Sharma Covert, MD      . magnesium hydroxide (MILK OF MAGNESIA) suspension 30 mL  30 mL Oral Daily PRN Connye Burkitt, NP      . memantine [facility]) tablet 10 mg  10 mg Oral BID Johnn Hai, MD   10 mg at 12/05/19 0756  . pantoprazole (PROTONIX) EC tablet 40 mg  40 mg Oral Daily Sharma Covert, MD   40 mg at 12/05/19 0756  . prenatal multivitamin tablet 1 tablet  1 tablet Oral Daily Johnn Hai, MD   1 tablet at 12/05/19 0756  . QUEtiapine (SEROQUEL) tablet 50 mg  50 mg Oral QHS Johnn Hai, MD   50 mg at 12/04/19 2130  . risperiDONE (RISPERDAL) tablet 2 mg  2 mg Oral q AM Connye Burkitt, NP   2 mg at 12/05/19 0756  . risperiDONE (RISPERDAL) tablet  6 mg  6 mg Oral QHS Johnn Hai, MD   6 mg at 12/04/19 2129  . temazepam (RESTORIL) capsule 15 mg  15 mg Oral QHS Sharma Covert, MD   15 mg at 12/04/19 2130  . traZODone (DESYREL) tablet 50 mg  50 mg Oral QHS PRN Connye Burkitt, NP   50 mg at 12/02/19 2122   PTA Medications: Medications Prior to Admission  Medication Sig Dispense Refill Last Dose  . ondansetron (ZOFRAN) 4 MG tablet Take 1 tablet (4 mg total) by mouth every 8 (eight) hours as needed for nausea or vomiting. 15 tablet 0     Patient Stressors: Financial difficulties Medication change or noncompliance Occupational concerns  Patient Strengths: Motivation for treatment/growth  Treatment Modalities: Medication Management, Group therapy, Case management,  1 to 1 session with clinician, Psychoeducation, Recreational therapy.   Physician Treatment Plan for Primary Diagnosis: <principal problem not specified> Long Term Goal(s): Improvement in symptoms so as ready for discharge Improvement in symptoms so as ready for discharge   Short Term Goals: Ability to identify changes in lifestyle to reduce recurrence of condition will improve Ability to verbalize  feelings will improve Ability to disclose and discuss suicidal ideas Ability to demonstrate self-control will improve Ability to identify and develop effective coping behaviors will improve Ability to identify changes in lifestyle to reduce recurrence of condition will improve Ability to verbalize feelings will improve Ability to disclose and discuss suicidal ideas Ability to demonstrate self-control will improve Ability to identify and develop effective coping behaviors will improve Ability to maintain clinical measurements within normal limits will improve  Medication Management: Evaluate patient's response, side effects, and tolerance of medication regimen.  Therapeutic Interventions: 1 to 1 sessions, Unit Group sessions and Medication administration.  Evaluation  of Outcomes: Progressing  Physician Treatment Plan for Secondary Diagnosis: Active Problems:   Schizophrenia (HCC)  Long Term Goal(s): Improvement in symptoms so as ready for discharge Improvement in symptoms so as ready for discharge   Short Term Goals: Ability to identify changes in lifestyle to reduce recurrence of condition will improve Ability to verbalize feelings will improve Ability to disclose and discuss suicidal ideas Ability to demonstrate self-control will improve Ability to identify and develop effective coping behaviors will improve Ability to identify changes in lifestyle to reduce recurrence of condition will improve Ability to verbalize feelings will improve Ability to disclose and discuss suicidal ideas Ability to demonstrate self-control will improve Ability to identify and develop effective coping behaviors will improve Ability to maintain clinical measurements within normal limits will improve     Medication Management: Evaluate patient's response, side effects, and tolerance of medication regimen.  Therapeutic Interventions: 1 to 1 sessions, Unit Group sessions and Medication administration.  Evaluation of Outcomes: Progressing   RN Treatment Plan for Primary Diagnosis: <principal problem not specified> Long Term Goal(s): Knowledge of disease and therapeutic regimen to maintain health will improve  Short Term Goals: Ability to participate in decision making will improve, Ability to verbalize feelings will improve, Ability to disclose and discuss suicidal ideas, Ability to identify and develop effective coping behaviors will improve and Compliance with prescribed medications will improve  Medication Management: RN will administer medications as ordered by provider, will assess and evaluate patient's response and provide education to patient for prescribed medication. RN will report any adverse and/or side effects to prescribing provider.  Therapeutic  Interventions: 1 on 1 counseling sessions, Psychoeducation, Medication administration, Evaluate responses to treatment, Monitor vital signs and CBGs as ordered, Perform/monitor CIWA, COWS, AIMS and Fall Risk screenings as ordered, Perform wound care treatments as ordered.  Evaluation of Outcomes: Progressing   LCSW Treatment Plan for Primary Diagnosis: <principal problem not specified> Long Term Goal(s): Safe transition to appropriate next level of care at discharge, Engage patient in therapeutic group addressing interpersonal concerns.  Short Term Goals: Engage patient in aftercare planning with referrals and resources  Therapeutic Interventions: Assess for all discharge needs, 1 to 1 time with Social worker, Explore available resources and support systems, Assess for adequacy in community support network, Educate family and significant other(s) on suicide prevention, Complete Psychosocial Assessment, Interpersonal group therapy.  Evaluation of Outcomes: Progressing   Progress in Treatment: Attending groups: No.  Participating in groups: No. Taking medication as prescribed: Yes. Toleration medication: Yes. Family/Significant other contact made: Yes, individual(s) contacted:  pt declined  Patient understands diagnosis: No. Discussing patient identified problems/goals with staff: Yes. Medical problems stabilized or resolved: Yes. Denies suicidal/homicidal ideation: Yes. Issues/concerns per patient self-inventory: No. Other:   New problem(s) identified: No, Describe:  none  New Short Term/Long Term Goal(s): Medication stabilization, elimination of SI thoughts, and development of  a comprehensive mental wellness plan.   Patient Goals:  "get my pain in stomach looked at"  Discharge Plan or Barriers: Patient declines outpatient follow up.  Reason for Continuation of Hospitalization: Delusions  Hallucinations Medication stabilization  Estimated Length of Stay: 1-2 days     Attendees: Patient:Collin White  12/05/2019   Physician: Dr. Jeannine Kitten, MD 12/05/2019   Nursing: Adela Ports., RN 12/05/2019   RN Care Manager: 12/05/2019   Social Worker: Daleen Squibb, LCSW  Enid Cutter, LCSWA 12/05/2019   Recreational Therapist:  12/05/2019   Other: Earlyne Iba, MSW intern  12/05/2019   Other:  12/05/2019   Other: 12/05/2019     Scribe for Treatment Team: Darreld Mclean, LCSWA 12/05/2019 9:22 AM

## 2019-12-05 NOTE — Progress Notes (Signed)
[place] NOVEL CORONAVIRUS (COVID-19) DAILY CHECK-OFF SYMPTOMS - answer yes or no to each - every day NO YES  Have you had a fever in the past 24 hours?  . Fever (Temp > 37.80C / 100F) X   Have you had any of these symptoms in the past 24 hours? . New Cough .  Sore Throat  .  Shortness of Breath .  Difficulty Breathing .  Unexplained Body Aches   X   Have you had any one of these symptoms in the past 24 hours not related to allergies?   . Runny Nose .  Nasal Congestion .  Sneezing   X   If you have had runny nose, nasal congestion, sneezing in the past 24 hours, has it worsened?  X   EXPOSURES - check yes or no X   Have you traveled outside the state in the past 14 days?  X   Have you been in contact with someone with a confirmed diagnosis of COVID-19 or PUI in the past 14 days without wearing appropriate PPE?  X   Have you been living in the same home as a person with confirmed diagnosis of COVID-19 or a PUI (household contact)?    X   Have you been diagnosed with COVID-19?    X              What to do next: Answered NO to all: Answered YES to anything:   Proceed with unit schedule Follow the [facility] Inpatient Flowsheet.   Pt remains delusional on interactions; still believes he's pregnant and "the baby is kicking my stomach". Denies SI, HI, AVH and pain when assessed. Observed in the shower multiple times, dried feces noted on pt's scrubs, bathroom floor and scrubs. Multiple verbal redirection offered, educated on infection control and environmental services assisted with cleaning. Compliant with medications when offered, denies adverse drug reactions when assessed.  Emotional support offered. Writer encouraged pt to voice concerns. Q 15 minutes safety checks maintained without self harm gestures or outburst to note thus far.  Pt tolerates all PO intake well. Remains safe on and off unit. Denies concerns at this time.

## 2019-12-05 NOTE — Progress Notes (Signed)
Recreation Therapy Notes  Date: 4.19.21 Time: 1000 Location: 500 Hall Dayroom  Group Topic: Coping Skills  Goal Area(s) Addresses:  Patients will identify positive coping skills. Patients will identify benefit of using coping skills post d/c.  Behavioral Response: Engaged  Intervention: Worksheet, pencils  Activity: Orthoptist.  Patients were to identify situations, feelings, etc they feel have been holding them back and write those inside the web.  Patients then identified and wrote positive coping skills that could be used to combat these feelings and situations on the outside of the web.  Education: Pharmacologist, Building control surveyor.   Education Outcome: Acknowledges understanding/In group clarification offered/Needs additional education.   Clinical Observations/Feedback: Pt was quiet but engaged in group.  Pt identified stomach pain as the thing that is holding her back.  Pt expressed "it makes it hard to do what your want to do because of how you feel".  Pt stated the main coping skill used is therapy to help deal with this situation.    [name], LRT/CTRS    [name] A 12/05/2019 11:42 AM

## 2019-12-06 DIAGNOSIS — F200 Paranoid schizophrenia: Secondary | ICD-10-CM | POA: Diagnosis not present

## 2019-12-06 MED ORDER — ARIPIPRAZOLE ER 400 MG IM SRER
400.0000 mg | INTRAMUSCULAR | Status: DC
  Administered 2019-12-06: 400 mg via INTRAMUSCULAR

## 2019-12-06 MED ORDER — RISPERIDONE 3 MG PO TABS: 6.0000 mg | ORAL_TABLET | Freq: Every day | ORAL | 2 refills | Status: AC

## 2019-12-06 MED ORDER — DIVALPROEX SODIUM 500 MG PO DR TAB: 500.0000 mg | DELAYED_RELEASE_TABLET | Freq: Every day | ORAL | 2 refills | Status: AC

## 2019-12-06 MED ORDER — DIVALPROEX SODIUM 500 MG PO DR TAB
500.0000 mg | DELAYED_RELEASE_TABLET | Freq: Every day | ORAL | Status: DC
  Filled 2019-12-06: qty 7

## 2019-12-06 MED ORDER — ARIPIPRAZOLE ER 400 MG IM SRER: 400.0000 mg | INTRAMUSCULAR | 11 refills | Status: AC

## 2019-12-06 MED ORDER — BENZTROPINE MESYLATE 0.5 MG PO TABS: 0.5000 mg | ORAL_TABLET | Freq: Two times a day (BID) | ORAL | 2 refills | Status: AC

## 2019-12-06 MED ORDER — PANTOPRAZOLE SODIUM 40 MG PO TBEC: 40.0000 mg | DELAYED_RELEASE_TABLET | Freq: Every day | ORAL | 1 refills | Status: AC

## 2019-12-06 NOTE — Progress Notes (Signed)
Recreation Therapy Notes  Date: 4.20.21 Time: 1000 Location: 500 Hall Dayroom  Group Topic: Wellness  Goal Area(s) Addresses:  Patient will define components of whole wellness. Patient will verbalize benefit of whole wellness.  Behavioral Response: None  Intervention: Music  Activity: Exercise.  LRT and patients engaged completed various dance moves to the rhythm of [employer]ro Beats.  Patients were to move as much as possible but were encouraged to take breaks as needed.  Education: Wellness, Building control surveyor.   Education Outcome: Acknowledges education/In group clarification offered/Needs additional education.   Clinical Observations/Feedback: Pt did not participate in activity but was observant and would brighten up when staff and other patients engaged her to participate.    [name], LRT/CTRS    [name] A 12/06/2019 10:46 AM

## 2019-12-06 NOTE — Progress Notes (Addendum)
[facility] MD Progress Note  12/06/2019 8:47 AM Collin White  MRN:  [id number] Subjective:  Patient reports feeling well, and endorses good sleep. Does state he felt pressure in stomach earlier this morning but says "it is not like it was the first time...two weeks ago when I got here" (displaying some awareness of time). Speech was mumbled and low for most of the interview, but notably clear and coherent for some portions. Oriented to place and person, but not time (given as "December...year 4000"); as has been typical of patient during stay here, amidst frequent attempts at redirection. Delusion of residence at a castle following discharge persists. Patient denies SI/HI/AVH.  Principal Problem: <principal problem not specified> Diagnosis: Active Problems:   Schizophrenia (HCC)  Total Time spent with patient: 15 minutes  Past Psychiatric History: see admission H&P  Past Medical History:  Past Medical History:  Diagnosis Date  . Migraines   . Psychoses (HCC)   . Schizophrenia (HCC)   . Seizures (HCC)     Past Surgical History:  Procedure Laterality Date  . abd surgery s/p traumatic event    . facial reconstructive surgery    . NO PAST SURGERIES  patient stated he had facial reconstruction surgery as a child   . 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 Hypertension Other   . Mental illness Neg Hx    Family Psychiatric  History: see admission H&P Social History:  Social History   Substance and Sexual Activity  Alcohol Use No     Social History   Substance and Sexual Activity  Drug Use No    Social History   Socioeconomic History  . Marital status: Single    Spouse name: Not on file  . Number of children: Not on file  . Years of education: Not on file  . Highest education level: Not on file  Occupational History  . Not on file  Tobacco Use  . Smoking status: Former Smoker    Types: Cigarettes  . Smokeless  tobacco: Never Used  Substance and Sexual Activity  . Alcohol use: No  . Drug use: No  . Sexual activity: Yes    Birth control/protection: Condom  Other Topics Concern  . Not on file  Social History Narrative   ** Merged History Encounter **       ** Merged History Encounter **       Social Determinants of Health   Financial Resource Strain:   . Difficulty of Paying Living Expenses:   Food Insecurity:   . Worried About Programme researcher, broadcasting/film/video in the Last Year:   . Barista in the Last Year:   Transportation Needs:   . Freight forwarder (Medical):   Marland Kitchen Lack of Transportation (Non-Medical):   Physical Activity:   . Days of Exercise per Week:   . Minutes of Exercise per Session:   Stress:   . Feeling of Stress :   Social Connections:   . Frequency of Communication with Friends and Family:   . Frequency of Social Gatherings with Friends and Family:   . Attends Religious Services:   . Active Member of Clubs or Organizations:   . Attends Banker Meetings:   Marland Kitchen Marital Status:    Additional Social History:  Sleep: Good  Appetite:  Good  Current Medications: Current Facility-Administered Medications  Medication Dose Route Frequency Provider Last Rate Last Admin  . acetaminophen (TYLENOL) tablet 650 mg  650 mg Oral Q6H PRN Connye Burkitt, NP      . alum & mag hydroxide-simeth (MAALOX/MYLANTA) 200-200-20 MG/5ML suspension 30 mL  30 mL Oral Q4H PRN Connye Burkitt, NP      . benztropine (COGENTIN) tablet 0.5 mg  0.5 mg Oral BID Johnn Hai, MD   0.5 mg at 12/06/19 0258  . celecoxib (CELEBREX) capsule 200 mg  200 mg Oral BID Johnn Hai, MD   200 mg at 12/06/19 5277  . divalproex (DEPAKOTE) DR tablet 500 mg  500 mg Oral QHS Johnn Hai, MD   500 mg at 12/05/19 2022  . hydrOXYzine (ATARAX/VISTARIL) tablet 25 mg  25 mg Oral TID PRN Connye Burkitt, NP   25 mg at 12/03/19 2235  . OLANZapine zydis (ZYPREXA) disintegrating  tablet 10 mg  10 mg Oral Q8H PRN Sharma Covert, MD       And  . LORazepam (ATIVAN) tablet 1 mg  1 mg Oral PRN Sharma Covert, MD       And  . ziprasidone (GEODON) injection 20 mg  20 mg Intramuscular PRN Sharma Covert, MD      . magnesium hydroxide (MILK OF MAGNESIA) suspension 30 mL  30 mL Oral Daily PRN Connye Burkitt, NP      . memantine [facility]) tablet 10 mg  10 mg Oral BID Johnn Hai, MD   10 mg at 12/06/19 8242  . pantoprazole (PROTONIX) EC tablet 40 mg  40 mg Oral Daily Sharma Covert, MD   40 mg at 12/06/19 3536  . prenatal multivitamin tablet 1 tablet  1 tablet Oral Daily Johnn Hai, MD   1 tablet at 12/06/19 [id number]  . QUEtiapine (SEROQUEL) tablet 50 mg  50 mg Oral QHS Johnn Hai, MD   50 mg at 12/05/19 2022  . risperiDONE (RISPERDAL) tablet 2 mg  2 mg Oral q AM Connye Burkitt, NP   2 mg at 12/06/19 1540  . risperiDONE (RISPERDAL) tablet 6 mg  6 mg Oral QHS Johnn Hai, MD   6 mg at 12/05/19 2022  . temazepam (RESTORIL) capsule 15 mg  15 mg Oral QHS Sharma Covert, MD   15 mg at 12/05/19 2022  . traZODone (DESYREL) tablet 50 mg  50 mg Oral QHS PRN Connye Burkitt, NP   50 mg at 12/02/19 2122    Lab Results: No results found for this or any previous visit (from the past 48 hour(s)).  Blood Alcohol level:  Lab Results  Component Value Date   ETH <10 11/24/2019   ETH <10 08/67/6195    Metabolic Disorder Labs: Lab Results  Component Value Date   HGBA1C 5.1 11/24/2019   MPG 100 11/24/2019   MPG 99.67 07/06/2017   Lab Results  Component Value Date   PROLACTIN 32.4 (H) 11/24/2019   PROLACTIN 21.6 (H) 07/06/2017   Lab Results  Component Value Date   CHOL 119 11/24/2019   TRIG 143 11/24/2019   HDL 39 (L) 11/24/2019   CHOLHDL 3.1 11/24/2019   VLDL 29 11/24/2019   LDLCALC 51 11/24/2019   LDLCALC 91 07/06/2017    Physical Findings: AIMS: Facial and Oral Movements Muscles of Facial Expression: None, normal Lips and Perioral Area: None,  normal Jaw: None, normal Tongue: None, normal,Extremity Movements Upper (arms,  wrists, hands, fingers): None, normal Lower (legs, knees, ankles, toes): None, normal, Trunk Movements Neck, shoulders, hips: None, normal, Overall Severity Severity of abnormal movements (highest score from questions above): None, normal Incapacitation due to abnormal movements: None, normal Patient's awareness of abnormal movements (rate only patient's report): No Awareness, Dental Status Current problems with teeth and/or dentures?: No Does patient usually wear dentures?: No  CIWA:    COWS:     Musculoskeletal: Strength & Muscle Tone: within normal limits Gait & Station: normal Patient leans: N/A  Psychiatric Specialty Exam: Physical Exam  Review of Systems  Blood pressure 112/76, pulse (!) 106, temperature 98.2 F (36.8 C), temperature source Oral, resp. rate 18, SpO2 99 %.There is no height or weight on file to calculate BMI.  General Appearance: Disheveled  Eye Contact:  Fair  Speech:  Garbled  Volume:  Decreased  Mood:  Dysphoric  Affect:  Congruent  Thought Process:  Disorganized  Orientation:  Other:  Person and Place, NOT Time  Thought Content:  Delusions  Suicidal Thoughts:  No  Homicidal Thoughts:  No  Memory:  Immediate;   Fair Recent;   Fair Remote;   Poor  Judgement:  Fair  Insight:  Fair  Psychomotor Activity:  Normal  Concentration:  Concentration: Fair and Attention Span: Fair  Recall:  Good  Fund of Knowledge:  Fair  Language:  Fair  Akathisia:  No  Handed:  Right  AIMS (if indicated):     Assets:  Desire for Improvement Resilience  ADL's:  Intact  Cognition:  WNL  Sleep:  Number of Hours: 9     Treatment Plan Summary: Daily contact with patient to assess and evaluate symptoms and progress in treatment, Medication management and Plan continue medication regimen (including Depakote, Seroquel, Risperdal, Memantine, & Celecoxib), give Abilify LAI, plan for discharge    Greer Ee, Medical Student 12/06/2019, 8:47 AM

## 2019-12-06 NOTE — BHH Counselor (Signed)
CSW met with patient at bedside to discuss discharge planning and follow up.  Patient was unable to provide concrete information regarding their living situation and discharge plans. Per chart review, patient has stated they live "in a castle" and that they own several homes. There is no address listed on patient's facesheet and patient has not granted collateral consents.  Patient states "I have a car," as far as living arrangements at discharge. CSW inquired if patient needed transportation back to their vehicle. Patient stated they drive "a race car" and "it's parked right out front." Patient did not disclose any further identifying information for the racecar.  CSW reiterated to patient that CSW is able to provide transportation assistance.  Patient's RN updated.  Stephanie Acre, MSW, LCSW-A Clinical Social Worker [facility]

## 2019-12-06 NOTE — Discharge Summary (Signed)
Physician Discharge Summary Note  Patient:  Collin White is an 24 y.o., adult MRN:  [id number] DOB:  1995-12-07 Patient phone:  [phone] (home)  Patient address:   [place] [address],  Total Time spent with patient: 45 minutes  Date of Admission:  11/23/2019 Date of Discharge: 12/06/2019  Reason for Admission:   History of Present Illness:   This is the latest of multiple healthcare system encounters/admissions for this 24 year old homelessness individual, who presents once again in a disorganized state of mind.  This is typical of this individual's psychotic exacerbations.  Apparently there is chronic noncompliance.  The patient [place], actively hallucinating somewhat disorganized and brought in by [facility] police.  Described as "disheveled and malodorous" and speaking in delusional terms.  When I interviewed the patient, there is neologisms, mumbling and often incoherent speech, focused on the right flank pain but that tends to be delusional describing "light" in the body as best I can tell but again speech is garbled and combined with neologisms. The patient has had 2 prior urine drug screens with prior encounters and they have all been negative  The patient is apparently transgender however I think the condition of psychosis supersedes all other diagnoses as a priority, and gender identity issues must be viewed in the context of the severity of untreated psychosis  According to NP eval of 4/7: Collin White an 24 y.o.adultwith history of schizophrenia, prefers male pronouns and to be called "Ms February." She was brought in by [facility] after being found at [facility] with incoherent speech and talking to people who were not there. History is difficult to obtain as patient is mumbling, significantly disorganized and delusional with some apparent thought blocking. Per chart review, she was seen at [name] and prescribed Invega at  one point. It is unclear how long patient has been off medications. She is disheveled and malodorous. She states she is in the hospital because "I have too much light in my body. I can't write because of my heart." Chart review shows history of homelessness. Patient states she lives in "a [place] here, a castle there." She is unable to provide contacts for collateral information and starts speaking about "ancestors and people with warrants" when asked about social supports. When asked about drug/alcohol use, she speaks about breakfast foods. She is oriented to place but unable to say year or month. She is agreeable to voluntary inpatient admission Principal Problem: Chronic schizophrenia Discharge Diagnoses: Active Problems:   Schizophrenia [facility])   Past Psychiatric History: Extensive  Past Medical History:  Past Medical History:  Diagnosis Date  . Migraines   . Psychoses (HCC)   . Schizophrenia (HCC)   . Seizures (HCC)     Past Surgical History:  Procedure Laterality Date  . abd surgery s/p traumatic event    . facial reconstructive surgery    . NO PAST SURGERIES  patient stated he had facial reconstruction surgery as a child   . 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 Hypertension Other   . Mental illness Neg Hx    Family Psychiatric  History: denies Social History:  Social History   Substance and Sexual Activity  Alcohol Use No     Social History   Substance and Sexual Activity  Drug Use No    Social History   Socioeconomic History  . Marital status: Single  Spouse name: Not on file  . Number of children: Not on file  . Years of education: Not on file  . Highest education level: Not on file  Occupational History  . Not on file  Tobacco Use  . Smoking status: Former Smoker    Types: Cigarettes  . Smokeless tobacco: Never Used  Substance and Sexual Activity  . Alcohol use: No  . Drug use: No  . Sexual  activity: Yes    Birth control/protection: Condom  Other Topics Concern  . Not on file  Social History Narrative   ** Merged History Encounter **       ** Merged History Encounter **       Social Determinants of Health   Financial Resource Strain:   . Difficulty of Paying Living Expenses:   Food Insecurity:   . Worried About Charity fundraiser in the Last Year:   . Arboriculturist in the Last Year:   Transportation Needs:   . Film/video editor (Medical):   Marland Kitchen Lack of Transportation (Non-Medical):   Physical Activity:   . Days of Exercise per Week:   . Minutes of Exercise per Session:   Stress:   . Feeling of Stress :   Social Connections:   . Frequency of Communication with Friends and Family:   . Frequency of Social Gatherings with Friends and Family:   . Attends Religious Services:   . Active Member of Clubs or Organizations:   . Attends Archivist Meetings:   Marland Kitchen Marital Status:     Hospital Course:   Patient was admitted under routine precautions and during the hospital stay never required a higher level of supervision or monitoring.  The patient generally stayed in bed and displayed no dangerous behaviors but made frequent unusual/delusional statements.  Further there was compliance with overall medications to include the use of antipsychotics/Risperdal, and there was obviously need for long-acting injectable.  Past treatment with oral aripiprazole had resulted in no sensitivity so we utilized long-acting injectable prior to discharge.  At the point of discharge the patient was still making some disjointed and unusual statements, refuse group home placement, had no funds for such a placement but could likely be placed with pending funding in any rate the patient refused.  At the point of discharge will likely be going to a shelter.  Current status is alert and oriented to person place situation not exact date and still makes unusual statements about months "not  existing" but no evidence or reports of auditory or visual hallucinations no thoughts of harming self or others.  We fear that there is always going to be a baseline residual psychosis due to treatment resistance despite compliance.  But there is no acute dangerousness  Physical Findings: AIMS: Facial and Oral Movements Muscles of Facial Expression: None, normal Lips and Perioral Area: None, normal Jaw: None, normal Tongue: None, normal,Extremity Movements Upper (arms, wrists, hands, fingers): None, normal Lower (legs, knees, ankles, toes): None, normal, Trunk Movements Neck, shoulders, hips: None, normal, Overall Severity Severity of abnormal movements (highest score from questions above): None, normal Incapacitation due to abnormal movements: None, normal Patient's awareness of abnormal movements (rate only patient's report): No Awareness, Dental Status Current problems with teeth and/or dentures?: No Does patient usually wear dentures?: No  CIWA:    COWS:    Musculoskeletal: Strength & Muscle Tone: within normal limits Gait & Station: normal Patient leans: N/A  Psychiatric Specialty Exam: Physical Exam  Review of Systems  Blood pressure 112/76, pulse (!) 106, temperature 98.2 F (36.8 C), temperature source Oral, resp. rate 18, SpO2 99 %.There is no height or weight on file to calculate BMI.  General Appearance: Disheveled  Eye Contact:  Fair  Speech:  Garbled  Volume:  Decreased  Mood:  Dysphoric  Affect:  Congruent  Thought Process:  Disorganized  Orientation:  Other:  Person and Place, NOT Time  Thought Content:  Delusions  Suicidal Thoughts:  No  Homicidal Thoughts:  No  Memory:  Immediate;   Fair Recent;   Fair Remote;   Poor  Judgement:  Fair  Insight:  Fair  Psychomotor Activity:  Normal  Concentration:  Concentration: Fair and Attention Span: Fair  Recall:  Good  Fund of Knowledge:  Fair  Language:  Fair  Akathisia:  No  Handed:  Right  AIMS (if  indicated):     Assets:  Desire for Improvement Resilience  ADL's:  Intact  Cognition:  WNL  Sleep:  Number of Hours: 9       Has this patient used any form of tobacco in the last 30 days? (Cigarettes, Smokeless Tobacco, Cigars, and/or Pipes) Yes, No  Blood Alcohol level:  Lab Results  Component Value Date   ETH <10 11/24/2019   ETH <10 08/19/2018    Metabolic Disorder Labs:  Lab Results  Component Value Date   HGBA1C 5.1 11/24/2019   MPG 100 11/24/2019   MPG 99.67 07/06/2017   Lab Results  Component Value Date   PROLACTIN 32.4 (H) 11/24/2019   PROLACTIN 21.6 (H) 07/06/2017   Lab Results  Component Value Date   CHOL 119 11/24/2019   TRIG 143 11/24/2019   HDL 39 (L) 11/24/2019   CHOLHDL 3.1 11/24/2019   VLDL 29 11/24/2019   LDLCALC 51 11/24/2019   LDLCALC 91 07/06/2017    See Psychiatric Specialty Exam and Suicide Risk Assessment completed by Attending Physician prior to discharge.  Discharge destination:  Home  Is patient on multiple antipsychotic therapies at discharge:  No   Has Patient had three or more failed trials of antipsychotic monotherapy by history:  No  Recommended Plan for Multiple Antipsychotic Therapies: NA   Allergies as of 12/06/2019   No Known Allergies     Medication List    STOP taking these medications   ondansetron 4 MG tablet Commonly known as: ZOFRAN     TAKE these medications     Indication  ARIPiprazole ER 400 MG Srer injection Commonly known as: ABILIFY MAINTENA Inject 2 mLs (400 mg total) into the muscle every 28 (twenty-eight) days. Due 5/15  Indication: MIXED BIPOLAR AFFECTIVE DISORDER   benztropine 0.5 MG tablet Commonly known as: COGENTIN Take 1 tablet (0.5 mg total) by mouth 2 (two) times daily.  Indication: Extrapyramidal Reaction caused by Medications   divalproex 500 MG DR tablet Commonly known as: DEPAKOTE Take 1 tablet (500 mg total) by mouth at bedtime.  Indication: Schizophrenia   pantoprazole 40  MG tablet Commonly known as: PROTONIX Take 1 tablet (40 mg total) by mouth daily. Start taking on: December 07, 2019  Indication: Gastroesophageal Reflux Disease   risperiDONE 3 MG tablet Commonly known as: RISPERDAL Take 2 tablets (6 mg total) by mouth at bedtime.  Indication: Hypomanic Episode of Bipolar Disorder        Signed: Malvin Johns, MD 12/06/2019, 10:06 AM

## 2019-12-06 NOTE — Plan of Care (Signed)
Pt was able to interact with staff and peers in a pro-social manner with 5 group sessions at completion of recreation therapy group sessions.   [name], LRT/CTRS

## 2019-12-06 NOTE — Progress Notes (Signed)
RN met with pt and reviewed pt's discharge instructions.  Pt verbalized understanding of discharge instructions and pt did not have any questions. RN returned pt's belongings to pt. Prescriptions were given to pt.  Pt denies SI/HI/AVH and voiced no concerns. Patient discharged to the lobby without incident.

## 2019-12-06 NOTE — Progress Notes (Signed)
Recreation Therapy Notes  INPATIENT RECREATION TR PLAN  Patient Details Name: Collin White MRN: [id number] DOB: 01/17/1996 Today's Date: 12/06/2019  Rec Therapy Plan Is patient appropriate for Therapeutic Recreation?: Yes Treatment times per week: about 3 days Estimated Length of Stay: 5-7 days TR Treatment/Interventions: Group participation (Comment)  Discharge Criteria Pt will be discharged from therapy if:: Discharged Treatment plan/goals/alternatives discussed and agreed upon by:: Patient/family  Discharge Summary Short term goals set: See patient care plan Short term goals met: Complete Progress toward goals comments: Groups attended Which groups?: Coping skills, Wellness, Other (Comment)(Team building) Reason goals not met: None Therapeutic equipment acquired: N/A Reason patient discharged from therapy: Discharge from hospital Pt/family agrees with progress & goals achieved: Yes Date patient discharged from therapy: 12/06/19    Victorino Sparrow, LRT/CTRS  Ria Comment, Nelwyn Hebdon A 12/06/2019, 11:01 AM

## 2019-12-06 NOTE — Progress Notes (Signed)
  [facility] Adult Case Management Discharge Plan :  Will you be returning to the same living situation after discharge:  Yes,  patient reports they will be returning home to their [place]. Declined shelter resources. At discharge, do you have transportation home?: Yes,  reports they have a race car at [facility]. Declined transportation assistance from CSW. Do you have the ability to pay for your medications: No. Provided samples.  Release of information consent forms completed and in the chart;  Patient's signature needed at discharge.  Patient to Follow up at: Follow-up Information    [name]. Go on 12/08/2019.   Why: You are scheduled for an appointment on 12/08/19 at 8:30 am.  This appointment will be held in person.   Contact information: [address] [place] [address] [phone]           Next level of care provider has access to [facility] Link:no  Safety Planning and Suicide Prevention discussed: Yes,  with patient. Patient declined consents.  Has patient been referred to the Quitline?: N/A patient is not a smoker  Patient has been referred for addiction treatment: Yes  Darreld Mclean, LCSWA 12/06/2019, 11:52 AM

## 2019-12-06 NOTE — BHH Suicide Risk Assessment (Signed)
[facility] Discharge Suicide Risk Assessment   Principal Problem: <principal problem not specified> Discharge Diagnoses: Active Problems:   Schizophrenia (HCC)   Total Time spent with patient: 45 minutes Musculoskeletal: Strength & Muscle Tone: within normal limits Gait & Station: normal Patient leans: N/A  Psychiatric Specialty Exam: Physical Exam  Review of Systems  Blood pressure 112/76, pulse (!) 106, temperature 98.2 F (36.8 C), temperature source Oral, resp. rate 18, SpO2 99 %.There is no height or weight on file to calculate BMI.  General Appearance: Disheveled  Eye Contact:  Fair  Speech:  Garbled  Volume:  Decreased  Mood:  Dysphoric  Affect:  Congruent  Thought Process:  Disorganized  Orientation:  Other:  Person and Place, NOT Time  Thought Content:  Delusions  Suicidal Thoughts:  No  Homicidal Thoughts:  No  Memory:  Immediate;   Fair Recent;   Fair Remote;   Poor  Judgement:  Fair  Insight:  Fair  Psychomotor Activity:  Normal  Concentration:  Concentration: Fair and Attention Span: Fair  Recall:  Good  Fund of Knowledge:  Fair  Language:  Fair  Akathisia:  No  Handed:  Right  AIMS (if indicated):     Assets:  Desire for Improvement Resilience  ADL's:  Intact  Cognition:  WNL  Sleep:  Number of Hours: 9     Mental Status Per Nursing Assessment::   On Admission:  NA  Demographic Factors:  Gay, lesbian, or bisexual orientation  Loss Factors: Decrease in vocational status  Historical Factors: NA  Risk Reduction Factors:   Religious beliefs about death  Continued Clinical Symptoms:  More than one psychiatric diagnosis Previous Psychiatric Diagnoses and Treatments  Cognitive Features That Contribute To Risk:  Loss of executive function    Suicide Risk:  Minimal: No identifiable suicidal ideation.  Patients presenting with no risk factors but with morbid ruminations; may be classified as minimal risk based on the severity of the depressive  symptoms  Follow-up Information    [name] Go on 12/08/2019.   Why: You are scheduled for an appointment on 12/08/19 at 8:30 am.  This appointment will be held in person.   Contact information: [address] [place] [address] [phone]           Plan Of Care/Follow-up recommendations:  Activity:  nl  Malvin Johns, MD 12/06/2019, 10:48 AM

## 2019-12-07 ENCOUNTER — Encounter (HOSPITAL_COMMUNITY): Payer: Self-pay

## 2019-12-07 ENCOUNTER — Emergency Department (HOSPITAL_COMMUNITY)
Admission: EM | Admit: 2019-12-07 | Discharge: 2019-12-08 | Disposition: A | Discharge status: Home / Self Care | Payer: Medicaid Other | Attending: Emergency Medicine | Admitting: Emergency Medicine

## 2019-12-07 ENCOUNTER — Other Ambulatory Visit: Payer: Self-pay

## 2019-12-07 DIAGNOSIS — Z87891 Personal history of nicotine dependence: Secondary | ICD-10-CM | POA: Insufficient documentation

## 2019-12-07 DIAGNOSIS — M62838 Other muscle spasm: Secondary | ICD-10-CM | POA: Insufficient documentation

## 2019-12-07 LAB — COMPREHENSIVE METABOLIC PANEL
ALT: 68 U/L — ABNORMAL HIGH (ref 0–44)
AST: 52 U/L — ABNORMAL HIGH (ref 15–41)
Albumin: 4.1 g/dL (ref 3.5–5.0)
Alkaline Phosphatase: 84 U/L (ref 38–126)
Anion gap: 9 (ref 5–15)
BUN: 20 mg/dL (ref 6–20)
CO2: 25 mmol/L (ref 22–32)
Calcium: 9.1 mg/dL (ref 8.9–10.3)
Chloride: 104 mmol/L (ref 98–111)
Creatinine, Ser: 0.96 mg/dL (ref 0.61–1.24)
GFR calc Af Amer: >60 mL/min (ref >60)
GFR calc non Af Amer: >60 mL/min (ref >60)
Glucose, Bld: 98 mg/dL (ref 70–99)
Potassium: 3.7 mmol/L (ref 3.5–5.1)
Sodium: 138 mmol/L (ref 135–145)
Total Bilirubin: 0.4 mg/dL (ref 0.3–1.2)
Total Protein: 7.3 g/dL (ref 6.5–8.1)

## 2019-12-07 LAB — CBC WITH DIFFERENTIAL/PLATELET
Abs Immature Granulocytes: 0.03 10*3/uL (ref 0.00–0.07)
Basophils Absolute: 0 10*3/uL (ref 0.0–0.1)
Basophils Relative: 0 %
Eosinophils Absolute: 0.3 10*3/uL (ref 0.0–0.5)
Eosinophils Relative: 3 %
HCT: 38.2 % — ABNORMAL LOW (ref 39.0–52.0)
Hemoglobin: 12.5 g/dL — ABNORMAL LOW (ref 13.0–17.0)
Immature Granulocytes: 0 %
Lymphocytes Relative: 15 %
Lymphs Abs: 1.4 10*3/uL (ref 0.7–4.0)
MCH: 28.8 pg (ref 26.0–34.0)
MCHC: 32.7 g/dL (ref 30.0–36.0)
MCV: 88 fL (ref 80.0–100.0)
Monocytes Absolute: 1.1 10*3/uL — ABNORMAL HIGH (ref 0.1–1.0)
Monocytes Relative: 12 %
Neutro Abs: 6.5 10*3/uL (ref 1.7–7.7)
Neutrophils Relative %: 70 %
Platelets: 153 10*3/uL (ref 150–400)
RBC: 4.34 MIL/uL (ref 4.22–5.81)
RDW: 13.4 % (ref 11.5–15.5)
WBC: 9.3 10*3/uL (ref 4.0–10.5)
nRBC: 0 % (ref 0.0–0.2)

## 2019-12-07 NOTE — ED Triage Notes (Signed)
Pt reports that his "neck bone is shaking." Also reports that he ate "specks earlier today, but cant digest them."

## 2019-12-08 ENCOUNTER — Emergency Department (HOSPITAL_COMMUNITY)
Admission: EM | Admit: 2019-12-08 | Discharge: 2019-12-09 | Disposition: A | Discharge status: Home / Self Care | Payer: Medicaid Other | Attending: Emergency Medicine | Admitting: Emergency Medicine

## 2019-12-08 ENCOUNTER — Encounter (HOSPITAL_COMMUNITY): Payer: Self-pay | Admitting: Emergency Medicine

## 2019-12-08 ENCOUNTER — Other Ambulatory Visit: Payer: Self-pay

## 2019-12-08 DIAGNOSIS — R1013 Epigastric pain: Secondary | ICD-10-CM

## 2019-12-08 DIAGNOSIS — R42 Dizziness and giddiness: Secondary | ICD-10-CM | POA: Insufficient documentation

## 2019-12-08 DIAGNOSIS — Z79899 Other long term (current) drug therapy: Secondary | ICD-10-CM | POA: Insufficient documentation

## 2019-12-08 DIAGNOSIS — F209 Schizophrenia, unspecified: Secondary | ICD-10-CM | POA: Insufficient documentation

## 2019-12-08 MED ORDER — POTASSIUM CHLORIDE CRYS ER 10 MEQ PO TBCR
10.0000 meq | EXTENDED_RELEASE_TABLET | Freq: Once | ORAL | Status: AC
  Administered 2019-12-08: 10 meq via ORAL
  Filled 2019-12-08: qty 1

## 2019-12-08 MED ORDER — ONDANSETRON 4 MG PO TBDP: 4.0000 mg | ORAL_TABLET | Freq: Once | ORAL | Status: DC

## 2019-12-08 NOTE — ED Provider Notes (Signed)
[facility] DEPT Provider Note   CSN: [id number] Arrival date & time: 12/07/19  2032     History Chief Complaint  Patient presents with  . Neck Pain    Collin White is a 24 y.o. adult.  Patient has a history of schizophrenia.  He was just recently evaluated by behavioral health.  Patient complains of the neck muscles moving and his legs feeling weak  The history is provided by the patient. No language interpreter was used.  Weakness Severity:  Mild Onset quality:  Sudden Timing:  Constant Progression:  Waxing and waning Chronicity:  New Context: not alcohol use   Relieved by:  Nothing Worsened by:  Nothing Ineffective treatments:  None tried Associated symptoms: no abdominal pain, no chest pain, no cough, no diarrhea, no frequency, no headaches and no seizures        Past Medical History:  Diagnosis Date  . Migraines   . Psychoses ([place])   . Schizophrenia ([place])   . Seizures [name])     Patient Active Problem List   Diagnosis Date Noted  . Schizophrenia ([place]) 11/23/2019  . Acute episode of schizophrenia with history of multiple episodes ([place]) 06/30/2017  . Schizophrenia, paranoid type ([place]) 06/08/2017  . 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 Psychoses ([place])   . Paranoid schizophrenia ([place]) 09/29/2015  . Cannabis use disorder, moderate, in early remission ([place]) 09/26/2015  . History of posttraumatic stress disorder (PTSD) 09/26/2015    Past Surgical History:  Procedure Laterality Date  . abd surgery s/p traumatic event    . facial reconstructive surgery    . NO PAST SURGERIES  patient stated he had facial reconstruction surgery as a child   . tumor removed from head          Family History  Problem Relation Age of Onset  . Hypertension Other   . Mental illness Neg Hx     Social History   Tobacco Use  . Smoking status: Former Smoker    Types: Cigarettes  . Smokeless tobacco: Never Used  Substance Use  Topics  . Alcohol use: No  . Drug use: No    Home Medications Prior to Admission medications   Medication Sig Start Date End Date Taking? Authorizing Provider  ARIPiprazole ER (ABILIFY MAINTENA) 400 MG SRER injection Inject 2 mLs (400 mg total) into the muscle every 28 (twenty-eight) days. Due 5/15 12/06/19   Johnn Hai, MD  benztropine (COGENTIN) 0.5 MG tablet Take 1 tablet (0.5 mg total) by mouth 2 (two) times daily. 12/06/19   Johnn Hai, MD  divalproex (DEPAKOTE) 500 MG DR tablet Take 1 tablet (500 mg total) by mouth at bedtime. 12/06/19   Johnn Hai, MD  pantoprazole (PROTONIX) 40 MG tablet Take 1 tablet (40 mg total) by mouth daily. 12/07/19   Johnn Hai, MD  risperiDONE (RISPERDAL) 3 MG tablet Take 2 tablets (6 mg total) by mouth at bedtime. 12/06/19   Johnn Hai, MD    Allergies    Patient has no known allergies.  Review of Systems   Review of Systems  Constitutional: Negative for appetite change and fatigue.  HENT: Negative for congestion, ear discharge and sinus pressure.   Eyes: Negative for discharge.  Respiratory: Negative for cough.   Cardiovascular: Negative for chest pain.  Gastrointestinal: Negative for abdominal pain and diarrhea.  Genitourinary: Negative for frequency and hematuria.  Musculoskeletal: Negative for back pain.  Skin: Negative for rash.  Neurological: Positive for weakness. Negative for seizures and  headaches.  Psychiatric/Behavioral: Negative for hallucinations.    Physical Exam Updated Vital Signs BP 124/83   Pulse 73   Temp 97.9 F (36.6 C) (Oral)   Resp 18   SpO2 99%   Physical Exam Vitals and nursing note reviewed.  Constitutional:      Appearance: She is well-developed.  HENT:     Head: Normocephalic.     Nose: Nose normal.  Eyes:     General: No scleral icterus.    Conjunctiva/sclera: Conjunctivae normal.  Neck:     Thyroid: No thyromegaly.  Cardiovascular:     Rate and Rhythm: Normal rate and regular rhythm.      Heart sounds: No murmur. No friction rub. No gallop.   Pulmonary:     Breath sounds: No stridor. No wheezing or rales.  Chest:     Chest wall: No tenderness.  Abdominal:     General: There is no distension.     Tenderness: There is no abdominal tenderness. There is no rebound.  Musculoskeletal:        General: Normal range of motion.     Cervical back: Neck supple.     Comments: Mild weakness in both legs for patient able to ambulate  Lymphadenopathy:     Cervical: No cervical adenopathy.  Skin:    Findings: No erythema or rash.  Neurological:     Mental Status: She is alert and oriented to person, place, and time.     Motor: No abnormal muscle tone.     Coordination: Coordination normal.  Psychiatric:        Behavior: Behavior normal.     ED Results / Procedures / Treatments   Labs (all labs ordered are listed, but only abnormal results are displayed) Labs Reviewed  CBC WITH DIFFERENTIAL/PLATELET - Abnormal; Notable for the following components:      Result Value   Hemoglobin 12.5 (*)    HCT 38.2 (*)    Monocytes Absolute 1.1 (*)    All other components within normal limits  COMPREHENSIVE METABOLIC PANEL - Abnormal; Notable for the following components:   AST 52 (*)    ALT 68 (*)    All other components within normal limits    EKG None  Radiology No results found.  Procedures Procedures (including critical care time)  Medications Ordered in ED Medications  potassium chloride (KLOR-CON) CR tablet 10 mEq (has no administration in time range)    ED Course  I have reviewed the triage vital signs and the nursing notes.  Pertinent labs & imaging results that were available during my care of the patient were reviewed by me and considered in my medical decision making (see chart for details).    MDM Rules/Calculators/A&P                      Patient has muscle problems in his neck and his legs I suspect is more related to his schizophrenia.  He did have a  mildly low but normal potassium level and I gave him extra potassium and told him this would help with the strength in his legs and neck.  He will follow-up as needed Final Clinical Impression(s) / ED Diagnoses Final diagnoses:  Muscle spasms of neck    Rx / DC Orders ED Discharge Orders    None       Bethann Berkshire, MD 12/08/19 [phone]

## 2019-12-08 NOTE — ED Triage Notes (Signed)
Pt states his stomach hurts, he has only eaten twice today, and now he feels dizzy.

## 2019-12-08 NOTE — ED Provider Notes (Signed)
[name] Provider Note   CSN: [id number] Arrival date & time: 12/08/19  2142     History Chief Complaint  Patient presents with  . Abdominal Pain  . Dizziness    Collin White is a 24 y.o. adult.  HPI  24 year old male, with a PMH of schizophrenia, presents with diffuse abdominal pain.  Patient is very poor historian and it is difficult to obtain HPI.  Patient complaining of pain in the epigastric region, unclear how long he has had pain.  He states he has only eaten twice today but denies any hungry.  Patient denies any nausea, vomiting, chest pain, shortness of breath.  He is on the quality of the abdominal pain.       Past Medical History:  Diagnosis Date  . Migraines   . Psychoses ([place])   . Schizophrenia ([place])   . Seizures [facility])     Patient Active Problem List   Diagnosis Date Noted  . Schizophrenia ([place]) 11/23/2019  . Acute episode of schizophrenia with history of multiple episodes ([place]) 06/30/2017  . Schizophrenia, paranoid type ([place]) 06/08/2017  . 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 Psychoses ([place])   . Paranoid schizophrenia ([place]) 09/29/2015  . Cannabis use disorder, moderate, in early remission ([place]) 09/26/2015  . History of posttraumatic stress disorder (PTSD) 09/26/2015    Past Surgical History:  Procedure Laterality Date  . abd surgery s/p traumatic event    . facial reconstructive surgery    . NO PAST SURGERIES  patient stated he had facial reconstruction surgery as a child   . tumor removed from head          Family History  Problem Relation Age of Onset  . Hypertension Other   . Mental illness Neg Hx     Social History   Tobacco Use  . Smoking status: Former Smoker    Types: Cigarettes  . Smokeless tobacco: Never Used  Substance Use Topics  . Alcohol use: No  . Drug use: No    Home Medications Prior to Admission medications   Medication Sig Start Date End Date Taking?  Authorizing Provider  ARIPiprazole ER (ABILIFY MAINTENA) 400 MG SRER injection Inject 2 mLs (400 mg total) into the muscle every 28 (twenty-eight) days. Due 5/15 12/06/19   Collin Hai, MD  benztropine (COGENTIN) 0.5 MG tablet Take 1 tablet (0.5 mg total) by mouth 2 (two) times daily. 12/06/19   Collin Hai, MD  divalproex (DEPAKOTE) 500 MG DR tablet Take 1 tablet (500 mg total) by mouth at bedtime. 12/06/19   Collin Hai, MD  pantoprazole (PROTONIX) 40 MG tablet Take 1 tablet (40 mg total) by mouth daily. 12/07/19   Collin Hai, MD  risperiDONE (RISPERDAL) 3 MG tablet Take 2 tablets (6 mg total) by mouth at bedtime. 12/06/19   Collin Hai, MD    Allergies    Patient has no known allergies.  Review of Systems   Review of Systems  Constitutional: Negative for chills and fever.  Respiratory: Negative for shortness of breath.   Cardiovascular: Negative for chest pain.  Gastrointestinal: Positive for abdominal pain. Negative for nausea and vomiting.  Genitourinary: Negative for dysuria.  All other systems reviewed and are negative.   Physical Exam Updated Vital Signs BP 130/82 (BP Location: Left Arm)   Pulse 93   Temp 98.2 F (36.8 C) (Oral)   Resp 16   SpO2 100%   Physical Exam Vitals and nursing note reviewed.  Constitutional:  Appearance: She is well-developed.  HENT:     Head: Normocephalic and atraumatic.  Eyes:     Conjunctiva/sclera: Conjunctivae normal.  Cardiovascular:     Rate and Rhythm: Normal rate and regular rhythm.     Heart sounds: Normal heart sounds. No murmur.  Pulmonary:     Effort: Pulmonary effort is normal. No respiratory distress.     Breath sounds: Normal breath sounds. No wheezing or rales.  Abdominal:     General: Bowel sounds are normal. There is no distension.     Palpations: Abdomen is soft.     Tenderness: There is abdominal tenderness in the epigastric area.  Musculoskeletal:        General: No tenderness or deformity. Normal range of  motion.     Cervical back: Neck supple.  Skin:    General: Skin is warm and dry.     Findings: No erythema or rash.  Neurological:     Mental Status: She is alert and oriented to person, place, and time.  Psychiatric:        Behavior: Behavior normal.     ED Results / Procedures / Treatments   Labs (all labs ordered are listed, but only abnormal results are displayed) Labs Reviewed  CBC WITH DIFFERENTIAL/PLATELET  COMPREHENSIVE METABOLIC PANEL  LIPASE, BLOOD  URINALYSIS, ROUTINE W REFLEX MICROSCOPIC    EKG None  Radiology No results found.  Procedures Procedures (including critical care time)  Medications Ordered in ED Medications  ondansetron (ZOFRAN-ODT) disintegrating tablet 4 mg (has no administration in time range)    ED Course  I have reviewed the triage vital signs and the nursing notes.  Pertinent labs & imaging results that were available during my care of the patient were reviewed by me and considered in my medical decision making (see chart for details).    MDM Rules/Calculators/A&P                       Patient presents with abdominal pain.  Poor historian.  He is tender in epigastric region.  Blood work ordered and Zofran ordered.  Reviewed patient's chart.  It appears that he has a history of similar.  Will hold off on imaging.  After blood work and Zofran plan for reeval and p.o. challenge.  Patient will likely be able to be discharged home.  Signout given to incoming PA, Sharilyn Sites will follow up on results.    Final Clinical Impression(s) / ED Diagnoses Final diagnoses:  None    Rx / DC Orders ED Discharge Orders    None       Collin White 12/08/19 2353    Collin Munch, MD 12/12/19 [phone]

## 2019-12-08 NOTE — Discharge Instructions (Addendum)
Try to eat 1 banana at least couple times a week.  This will help with your potassium.  Follow-up with your doctor if needed

## 2019-12-09 ENCOUNTER — Other Ambulatory Visit: Payer: Self-pay

## 2019-12-09 DIAGNOSIS — Z87891 Personal history of nicotine dependence: Secondary | ICD-10-CM | POA: Insufficient documentation

## 2019-12-09 DIAGNOSIS — Z79899 Other long term (current) drug therapy: Secondary | ICD-10-CM | POA: Insufficient documentation

## 2019-12-09 DIAGNOSIS — K30 Functional dyspepsia: Secondary | ICD-10-CM | POA: Insufficient documentation

## 2019-12-09 LAB — CBC WITH DIFFERENTIAL/PLATELET
Abs Immature Granulocytes: 0.02 10*3/uL (ref 0.00–0.07)
Basophils Absolute: 0 10*3/uL (ref 0.0–0.1)
Basophils Relative: 0 %
Eosinophils Absolute: 0.2 10*3/uL (ref 0.0–0.5)
Eosinophils Relative: 2 %
HCT: 41.4 % (ref 39.0–52.0)
Hemoglobin: 13.4 g/dL (ref 13.0–17.0)
Immature Granulocytes: 0 %
Lymphocytes Relative: 16 %
Lymphs Abs: 1.5 10*3/uL (ref 0.7–4.0)
MCH: 29.1 pg (ref 26.0–34.0)
MCHC: 32.4 g/dL (ref 30.0–36.0)
MCV: 89.8 fL (ref 80.0–100.0)
Monocytes Absolute: 1 10*3/uL (ref 0.1–1.0)
Monocytes Relative: 10 %
Neutro Abs: 6.7 10*3/uL (ref 1.7–7.7)
Neutrophils Relative %: 72 %
Platelets: 173 10*3/uL (ref 150–400)
RBC: 4.61 MIL/uL (ref 4.22–5.81)
RDW: 13.6 % (ref 11.5–15.5)
WBC: 9.5 10*3/uL (ref 4.0–10.5)
nRBC: 0 % (ref 0.0–0.2)

## 2019-12-09 LAB — COMPREHENSIVE METABOLIC PANEL
ALT: 67 U/L — ABNORMAL HIGH (ref 0–44)
AST: 49 U/L — ABNORMAL HIGH (ref 15–41)
Albumin: 4.2 g/dL (ref 3.5–5.0)
Alkaline Phosphatase: 87 U/L (ref 38–126)
Anion gap: 13 (ref 5–15)
BUN: 29 mg/dL — ABNORMAL HIGH (ref 6–20)
CO2: 23 mmol/L (ref 22–32)
Calcium: 9.5 mg/dL (ref 8.9–10.3)
Chloride: 107 mmol/L (ref 98–111)
Creatinine, Ser: 0.88 mg/dL (ref 0.61–1.24)
GFR calc Af Amer: >60 mL/min (ref >60)
GFR calc non Af Amer: >60 mL/min (ref >60)
Glucose, Bld: 97 mg/dL (ref 70–99)
Potassium: 3.9 mmol/L (ref 3.5–5.1)
Sodium: 143 mmol/L (ref 135–145)
Total Bilirubin: 0.8 mg/dL (ref 0.3–1.2)
Total Protein: 7.8 g/dL (ref 6.5–8.1)

## 2019-12-09 LAB — LIPASE, BLOOD: Lipase: 24 U/L (ref 11–51)

## 2019-12-09 NOTE — ED Provider Notes (Signed)
  Assumed care from PA Kendrick at shift change.  See prior notes for full H&P.    Plan:  Labs pending, anticipate discharge.  Results for orders placed or performed during the hospital encounter of 12/08/19  CBC with Differential  Result Value Ref Range   WBC 9.5 4.0 - 10.5 K/uL   RBC 4.61 4.22 - 5.81 MIL/uL   Hemoglobin 13.4 13.0 - 17.0 g/dL   HCT 15.4 00.8 - 67.6 %   MCV 89.8 80.0 - 100.0 fL   MCH 29.1 26.0 - 34.0 pg   MCHC 32.4 30.0 - 36.0 g/dL   RDW 19.5 09.3 - 26.7 %   Platelets 173 150 - 400 K/uL   nRBC 0.0 0.0 - 0.2 %   Neutrophils Relative % 72 %   Neutro Abs 6.7 1.7 - 7.7 K/uL   Lymphocytes Relative 16 %   Lymphs Abs 1.5 0.7 - 4.0 K/uL   Monocytes Relative 10 %   Monocytes Absolute 1.0 0.1 - 1.0 K/uL   Eosinophils Relative 2 %   Eosinophils Absolute 0.2 0.0 - 0.5 K/uL   Basophils Relative 0 %   Basophils Absolute 0.0 0.0 - 0.1 K/uL   Immature Granulocytes 0 %   Abs Immature Granulocytes 0.02 0.00 - 0.07 K/uL  Comprehensive metabolic panel  Result Value Ref Range   Sodium 143 135 - 145 mmol/L   Potassium 3.9 3.5 - 5.1 mmol/L   Chloride 107 98 - 111 mmol/L   CO2 23 22 - 32 mmol/L   Glucose, Bld 97 70 - 99 mg/dL   BUN 29 (H) 6 - 20 mg/dL   Creatinine, Ser 1.24 0.61 - 1.24 mg/dL   Calcium 9.5 8.9 - 58.0 mg/dL   Total Protein 7.8 6.5 - 8.1 g/dL   Albumin 4.2 3.5 - 5.0 g/dL   AST 49 (H) 15 - 41 U/L   ALT 67 (H) 0 - 44 U/L   Alkaline Phosphatase 87 38 - 126 U/L   Total Bilirubin 0.8 0.3 - 1.2 mg/dL   GFR calc non Af Amer >60 >60 mL/min   GFR calc Af Amer >60 >60 mL/min   Anion gap 13 5 - 15  Lipase, blood  Result Value Ref Range   Lipase 24 11 - 51 U/L   DG Chest Port 1 View  Result Date: 11/22/2019 CLINICAL DATA:  Cough EXAM: PORTABLE CHEST 1 VIEW COMPARISON:  09/30/2018 FINDINGS: The heart size and mediastinal contours are within normal limits. Both lungs are clear. The visualized skeletal structures are unremarkable. IMPRESSION: Normal study. Electronically  Signed   By: Charlett Nose M.D.   On: 11/22/2019 21:25    Labs overall reassuring.  Mild elevation of LFT's, similar to prior values.  Patient without any active vomiting here. Do not feel he needs further work-up or imaging at this time.  Feel he is stable for discharge home.  Can return here for any new/acute changes.   Garlon Hatchet, PA-C 12/09/19 0121    Molpus, Jonny Ruiz, MD 12/09/19 [phone]

## 2019-12-09 NOTE — ED Triage Notes (Signed)
Pt was unable to eat tonight. Pt sts he ate chinese tonight

## 2019-12-10 ENCOUNTER — Emergency Department (HOSPITAL_COMMUNITY)
Admission: EM | Admit: 2019-12-10 | Discharge: 2019-12-10 | Disposition: A | Discharge status: Home / Self Care | Payer: Medicaid Other | Attending: Emergency Medicine | Admitting: Emergency Medicine

## 2019-12-10 DIAGNOSIS — R1013 Epigastric pain: Secondary | ICD-10-CM

## 2019-12-10 MED ORDER — ALUM & MAG HYDROXIDE-SIMETH 200-200-20 MG/5ML PO SUSP
30.0000 mL | Freq: Once | ORAL | Status: AC
  Administered 2019-12-10: 06:00:00 30 mL via ORAL
  Filled 2019-12-10: qty 30

## 2019-12-10 MED ORDER — ONDANSETRON 4 MG PO TBDP
4.0000 mg | ORAL_TABLET | Freq: Once | ORAL | Status: AC
  Administered 2019-12-10: 06:00:00 4 mg via ORAL
  Filled 2019-12-10: qty 1

## 2019-12-10 MED ORDER — HYOSCYAMINE SULFATE 0.125 MG SL SUBL
0.2500 mg | SUBLINGUAL_TABLET | Freq: Once | SUBLINGUAL | Status: AC
  Administered 2019-12-10: 06:00:00 0.25 mg via SUBLINGUAL
  Filled 2019-12-10: qty 2

## 2019-12-10 MED ORDER — ALUM & MAG HYDROXIDE-SIMETH 400-400-40 MG/5ML PO SUSP: 15.0000 mL | Freq: Four times a day (QID) | ORAL | 0 refills | Status: AC | PRN

## 2019-12-10 MED ORDER — LIDOCAINE VISCOUS HCL 2 % MT SOLN
15.0000 mL | Freq: Once | OROMUCOSAL | Status: AC
  Administered 2019-12-10: 06:00:00 15 mL via ORAL
  Filled 2019-12-10: qty 15

## 2019-12-10 NOTE — ED Notes (Signed)
Reviewed discharge instructions and where to pick up meds. Ambulatory upon leaving

## 2019-12-10 NOTE — ED Provider Notes (Signed)
[facility]-EMERGENCY DEPT Provider Note  CSN: [id number] Arrival date & time: 12/09/19 2238  Chief Complaint(s) Abdominal Pain  HPI Collin White is a 24 y.o. adult with past medical history listed below who presents to the emergency department with epigastric/left upper quadrant abdominal pain after eating Chinese food at the mall.  Patient endorses discomfort with mild nausea but no emesis.  No fevers or chills.  No coughing or congestion.  Pain is exacerbated with food intake.  No other physical complaints.  HPI  Past Medical History Past Medical History:  Diagnosis Date  . Migraines   . Psychoses ([facility])   . Schizophrenia ([facility])   . Seizures [facility])    Patient Active Problem List   Diagnosis Date Noted  . Schizophrenia ([facility]) 11/23/2019  . Acute episode of schizophrenia with history of multiple episodes ([facility]) 06/30/2017  . Schizophrenia, paranoid type ([facility]) 06/08/2017  . 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 Psychoses ([facility])   . Paranoid schizophrenia ([facility]) 09/29/2015  . Cannabis use disorder, moderate, in early remission ([facility]) 09/26/2015  . History of posttraumatic stress disorder (PTSD) 09/26/2015   Home Medication(s) Prior to Admission medications   Medication Sig Start Date End Date Taking? Authorizing Provider  benztropine (COGENTIN) 0.5 MG tablet Take 1 tablet (0.5 mg total) by mouth 2 (two) times daily. 12/06/19  Yes Malvin Johns, MD  divalproex (DEPAKOTE) 500 MG DR tablet Take 1 tablet (500 mg total) by mouth at bedtime. Patient taking differently: Take 500 mg by mouth daily.  12/06/19  Yes Malvin Johns, MD  pantoprazole (PROTONIX) 40 MG tablet Take 1 tablet (40 mg total) by mouth daily. 12/07/19  Yes Malvin Johns, MD  risperiDONE (RISPERDAL) 3 MG tablet Take 2 tablets (6 mg total) by mouth at bedtime. 12/06/19  Yes Malvin Johns, MD  alum & mag hydroxide-simeth (MAALOX ADVANCED MAX ST) 400-400-40 MG/5ML suspension Take 15 mLs by mouth every 6  (six) hours as needed for indigestion. 12/10/19   Nira Conn, MD  ARIPiprazole ER (ABILIFY MAINTENA) 400 MG SRER injection Inject 2 mLs (400 mg total) into the muscle every 28 (twenty-eight) days. Due 5/15 12/06/19   Malvin Johns, MD                                                                                                                                    Past Surgical History Past Surgical History:  Procedure Laterality Date  . abd surgery s/p traumatic event    . facial reconstructive surgery    . NO PAST SURGERIES  patient stated he had facial reconstruction surgery as a child   . tumor removed from head      Family History Family History  Problem Relation Age of Onset  . Hypertension Other   . Mental illness Neg Hx     Social History Social History   Tobacco Use  . Smoking status: Former Smoker  Types: Cigarettes  . Smokeless tobacco: Never Used  Substance Use Topics  . Alcohol use: No  . Drug use: No   Allergies Patient has no known allergies.  Review of Systems Review of Systems All other systems are reviewed and are negative for acute change except as noted in the HPI  Physical Exam Vital Signs  I have reviewed the triage vital signs BP (!) 149/89 (BP Location: Left Arm)   Pulse 80   Temp 98 F (36.7 C) (Oral)   Resp 18   SpO2 100%   Physical Exam Vitals reviewed.  Constitutional:      General: She is not in acute distress.    Appearance: She is well-developed. She is not diaphoretic.  HENT:     Head: Normocephalic and atraumatic.     Jaw: No trismus.     Right Ear: External ear normal.     Left Ear: External ear normal.     Nose: Nose normal.  Eyes:     General: No scleral icterus.    Conjunctiva/sclera: Conjunctivae normal.  Neck:     Trachea: Phonation normal.  Cardiovascular:     Rate and Rhythm: Normal rate and regular rhythm.  Pulmonary:     Effort: Pulmonary effort is normal. No respiratory distress.     Breath sounds:  No stridor.  Abdominal:     General: There is no distension.     Tenderness: There is abdominal tenderness (mild discomfort) in the left upper quadrant. There is no guarding or rebound.  Musculoskeletal:        General: Normal range of motion.     Cervical back: Normal range of motion.  Neurological:     Mental Status: She is alert and oriented to person, place, and time.  Psychiatric:        Behavior: Behavior normal.     ED Results and Treatments Labs (all labs ordered are listed, but only abnormal results are displayed) Labs Reviewed - No data to display                                                                                                                       EKG  EKG Interpretation  Date/Time:    Ventricular Rate:    PR Interval:    QRS Duration:   QT Interval:    QTC Calculation:   R Axis:     Text Interpretation:        Radiology No results found.  Pertinent labs & imaging results that were available during my care of the patient were reviewed by me and considered in my medical decision making (see chart for details).  Medications Ordered in ED Medications  ondansetron (ZOFRAN-ODT) disintegrating tablet 4 mg (4 mg Oral Given 12/10/19 0620)  alum & mag hydroxide-simeth (MAALOX/MYLANTA) 200-200-20 MG/5ML suspension 30 mL (30 mLs Oral Given 12/10/19 0621)    And  lidocaine (XYLOCAINE) 2 % viscous mouth solution 15 mL (15 mLs Oral Given 12/10/19 6269)  hyoscyamine (  LEVSIN SL) SL tablet 0.25 mg (0.25 mg Sublingual Given 12/10/19 6286)                                                                                                                                    Procedures Procedures  (including critical care time)  Medical Decision Making / ED Course I have reviewed the nursing notes for this encounter and the patient's prior records (if available in EHR or on provided paperwork).   Collin White was evaluated in Emergency Department on 12/10/2019 for  the symptoms described in the history of present illness. She was evaluated in the context of the global COVID-19 pandemic, which necessitated consideration that the patient might be at risk for infection with the SARS-CoV-2 virus that causes COVID-19. Institutional protocols and algorithms that pertain to the evaluation of patients at risk for COVID-19 are in a state of rapid change based on information released by regulatory bodies including the CDC and federal and state organizations. These policies and algorithms were followed during the patient's care in the ED.  Patient presented with left upper quadrant abdominal discomfort after suspicious food intake.  No emesis.  Mild left upper quadrant discomfort on exam.  No evidence of peritonitis.  Patient able to tolerate oral intake here provided with GI cocktail resulting in resolution of his abdominal pain. Repeat exam benign.  Patient is otherwise well-appearing.  Low suspicion for serious intra-abdominal inflammatory/infectious process requiring work-up at this time.      Final Clinical Impression(s) / ED Diagnoses Final diagnoses:  Dyspepsia   The patient appears reasonably screened and/or stabilized for discharge and I doubt any other medical condition or other [facility] requiring further screening, evaluation, or treatment in the ED at this time prior to discharge. Safe for discharge with strict return precautions.  Disposition: Discharge  Condition: Good  I have discussed the results, Dx and Tx plan with the patient/family who expressed understanding and agree(s) with the plan. Discharge instructions discussed at length. The patient/family was given strict return precautions who verbalized understanding of the instructions. No further questions at time of discharge.    ED Discharge Orders         Ordered    alum & mag hydroxide-simeth (MAALOX ADVANCED MAX ST) 381-771-16 MG/5ML suspension  Every 6 hours PRN     12/10/19 0749             Follow Up: Primary care provider  Schedule an appointment as soon as possible for a visit  If you do not have a primary care physician, contact [facility] at [phone] for referral      This chart was dictated using voice recognition software.  Despite best efforts to proofread,  errors can occur which can change the documentation meaning.   Fatima Blank, MD 12/10/19 [phone]

## 2019-12-10 NOTE — ED Notes (Signed)
Patient was given sandwich, crackers, peanut butter, and apple juice.

## 2019-12-12 ENCOUNTER — Other Ambulatory Visit: Payer: Self-pay

## 2019-12-12 ENCOUNTER — Encounter (HOSPITAL_COMMUNITY): Payer: Self-pay

## 2019-12-12 ENCOUNTER — Emergency Department (HOSPITAL_COMMUNITY)
Admission: EM | Admit: 2019-12-12 | Discharge: 2019-12-12 | Disposition: A | Discharge status: Home / Self Care | Payer: Medicaid Other | Attending: Emergency Medicine | Admitting: Emergency Medicine

## 2019-12-12 ENCOUNTER — Emergency Department (HOSPITAL_COMMUNITY): Payer: Self-pay

## 2019-12-12 DIAGNOSIS — R1084 Generalized abdominal pain: Secondary | ICD-10-CM

## 2019-12-12 DIAGNOSIS — Z87891 Personal history of nicotine dependence: Secondary | ICD-10-CM | POA: Insufficient documentation

## 2019-12-12 DIAGNOSIS — R0602 Shortness of breath: Secondary | ICD-10-CM | POA: Insufficient documentation

## 2019-12-12 DIAGNOSIS — R0981 Nasal congestion: Secondary | ICD-10-CM | POA: Insufficient documentation

## 2019-12-12 DIAGNOSIS — Z79899 Other long term (current) drug therapy: Secondary | ICD-10-CM | POA: Insufficient documentation

## 2019-12-12 LAB — COMPREHENSIVE METABOLIC PANEL
ALT: 37 U/L (ref 0–44)
AST: 28 U/L (ref 15–41)
Albumin: 4.1 g/dL (ref 3.5–5.0)
Alkaline Phosphatase: 78 U/L (ref 38–126)
Anion gap: 8 (ref 5–15)
BUN: 18 mg/dL (ref 6–20)
CO2: 26 mmol/L (ref 22–32)
Calcium: 9.1 mg/dL (ref 8.9–10.3)
Chloride: 105 mmol/L (ref 98–111)
Creatinine, Ser: 0.9 mg/dL (ref 0.61–1.24)
GFR calc Af Amer: >60 mL/min (ref >60)
GFR calc non Af Amer: >60 mL/min (ref >60)
Glucose, Bld: 94 mg/dL (ref 70–99)
Potassium: 4 mmol/L (ref 3.5–5.1)
Sodium: 139 mmol/L (ref 135–145)
Total Bilirubin: 0.3 mg/dL (ref 0.3–1.2)
Total Protein: 7.6 g/dL (ref 6.5–8.1)

## 2019-12-12 LAB — CBC WITH DIFFERENTIAL/PLATELET
Abs Immature Granulocytes: 0.01 10*3/uL (ref 0.00–0.07)
Basophils Absolute: 0 10*3/uL (ref 0.0–0.1)
Basophils Relative: 1 %
Eosinophils Absolute: 0.3 10*3/uL (ref 0.0–0.5)
Eosinophils Relative: 6 %
HCT: 42.4 % (ref 39.0–52.0)
Hemoglobin: 13.9 g/dL (ref 13.0–17.0)
Immature Granulocytes: 0 %
Lymphocytes Relative: 23 %
Lymphs Abs: 1.3 10*3/uL (ref 0.7–4.0)
MCH: 29.1 pg (ref 26.0–34.0)
MCHC: 32.8 g/dL (ref 30.0–36.0)
MCV: 88.7 fL (ref 80.0–100.0)
Monocytes Absolute: 0.6 10*3/uL (ref 0.1–1.0)
Monocytes Relative: 11 %
Neutro Abs: 3.4 10*3/uL (ref 1.7–7.7)
Neutrophils Relative %: 59 %
Platelets: 200 10*3/uL (ref 150–400)
RBC: 4.78 MIL/uL (ref 4.22–5.81)
RDW: 13.4 % (ref 11.5–15.5)
WBC: 5.8 10*3/uL (ref 4.0–10.5)
nRBC: 0 % (ref 0.0–0.2)

## 2019-12-12 LAB — URINALYSIS, ROUTINE W REFLEX MICROSCOPIC
Bilirubin Urine: NEGATIVE
Glucose, UA: NEGATIVE mg/dL
Hgb urine dipstick: NEGATIVE
Ketones, ur: NEGATIVE mg/dL
Leukocytes,Ua: NEGATIVE
Nitrite: NEGATIVE
Protein, ur: NEGATIVE mg/dL
Specific Gravity, Urine: 1.015 (ref 1.005–1.030)
pH: 5 (ref 5.0–8.0)

## 2019-12-12 LAB — LIPASE, BLOOD: Lipase: 25 U/L (ref 11–51)

## 2019-12-12 MED ORDER — ALUM & MAG HYDROXIDE-SIMETH 200-200-20 MG/5ML PO SUSP
30.0000 mL | Freq: Once | ORAL | Status: AC
  Administered 2019-12-12: 30 mL via ORAL
  Filled 2019-12-12: qty 30

## 2019-12-12 MED ORDER — LIDOCAINE VISCOUS HCL 2 % MT SOLN
15.0000 mL | Freq: Once | OROMUCOSAL | Status: AC
  Administered 2019-12-12: 15 mL via ORAL
  Filled 2019-12-12: qty 15

## 2019-12-12 NOTE — Discharge Instructions (Addendum)
Your workup was very reassuring. Please follow up with your primary care provider

## 2019-12-12 NOTE — ED Triage Notes (Signed)
Patient states that since we took his IV out this morning he has felt short of breath. Patient is in no distress in triage.

## 2019-12-12 NOTE — ED Triage Notes (Signed)
Pt reports R side abdominal pain that started at 1a. He states that it is his "normal pregnancy phase."

## 2019-12-12 NOTE — ED Provider Notes (Signed)
[place] DEPT Provider Note   CSN: [id number] Arrival date & time: 12/12/19  0433     History Chief Complaint  Patient presents with  . Abdominal Pain    Phillips Goulette is a 24 y.o. adult.  Patient is a 24 year old gentleman with past medical history of paranoid schizophrenia, cannabis use disorder presenting to the emergency department for abdominal pain.  Patient is a poor historian.  He reports both sides of his abdomen hurt.  He last ate last night and has had normal bowel movements and is urinating normally.  Denies any nausea, vomiting, fever, chills, diarrhea.        Past Medical History:  Diagnosis Date  . Migraines   . Psychoses ([place])   . Schizophrenia ([place])   . Seizures [facility])     Patient Active Problem List   Diagnosis Date Noted  . Schizophrenia ([place]) 11/23/2019  . Acute episode of schizophrenia with history of multiple episodes ([place]) 06/30/2017  . Schizophrenia, paranoid type ([place]) 06/08/2017  . 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 Psychoses ([place])   . Paranoid schizophrenia ([place]) 09/29/2015  . Cannabis use disorder, moderate, in early remission ([place]) 09/26/2015  . History of posttraumatic stress disorder (PTSD) 09/26/2015    Past Surgical History:  Procedure Laterality Date  . abd surgery s/p traumatic event    . facial reconstructive surgery    . NO PAST SURGERIES  patient stated he had facial reconstruction surgery as a child   . tumor removed from head          Family History  Problem Relation Age of Onset  . Hypertension Other   . Mental illness Neg Hx     Social History   Tobacco Use  . Smoking status: Former Smoker    Types: Cigarettes  . Smokeless tobacco: Never Used  Substance Use Topics  . Alcohol use: No  . Drug use: No    Home Medications Prior to Admission medications   Medication Sig Start Date End Date Taking? Authorizing Provider  alum & mag hydroxide-simeth (MAALOX  ADVANCED MAX ST) 400-400-40 MG/5ML suspension Take 15 mLs by mouth every 6 (six) hours as needed for indigestion. 12/10/19   Fatima Blank, MD  ARIPiprazole ER (ABILIFY MAINTENA) 400 MG SRER injection Inject 2 mLs (400 mg total) into the muscle every 28 (twenty-eight) days. Due 5/15 12/06/19   Johnn Hai, MD  benztropine (COGENTIN) 0.5 MG tablet Take 1 tablet (0.5 mg total) by mouth 2 (two) times daily. 12/06/19   Johnn Hai, MD  divalproex (DEPAKOTE) 500 MG DR tablet Take 1 tablet (500 mg total) by mouth at bedtime. Patient taking differently: Take 500 mg by mouth daily.  12/06/19   Johnn Hai, MD  pantoprazole (PROTONIX) 40 MG tablet Take 1 tablet (40 mg total) by mouth daily. 12/07/19   Johnn Hai, MD  risperiDONE (RISPERDAL) 3 MG tablet Take 2 tablets (6 mg total) by mouth at bedtime. 12/06/19   Johnn Hai, MD    Allergies    Patient has no known allergies.  Review of Systems   Review of Systems  Constitutional: Negative for chills and fever.  HENT: Negative for ear pain and sore throat.   Eyes: Negative for pain and visual disturbance.  Respiratory: Negative for cough and shortness of breath.   Cardiovascular: Negative for chest pain and palpitations.  Gastrointestinal: Positive for abdominal pain. Negative for constipation, diarrhea, nausea and vomiting.  Genitourinary: Negative for dysuria and hematuria.  Musculoskeletal: Negative  for arthralgias and back pain.  Skin: Negative for color change and rash.  Neurological: Negative for seizures and syncope.  All other systems reviewed and are negative.   Physical Exam Updated Vital Signs BP 126/73 (BP Location: Left Arm)   Pulse 79   Temp 97.6 F (36.4 C) (Oral)   Resp 16   SpO2 97%   Physical Exam Vitals and nursing note reviewed.  Constitutional:      General: She is not in acute distress.    Appearance: Normal appearance. She is not ill-appearing, toxic-appearing or diaphoretic.  HENT:     Head:  Normocephalic.     Mouth/Throat:     Mouth: Mucous membranes are moist.  Eyes:     Conjunctiva/sclera: Conjunctivae normal.  Cardiovascular:     Rate and Rhythm: Normal rate and regular rhythm.  Pulmonary:     Effort: Pulmonary effort is normal.  Abdominal:     General: Abdomen is flat.     Palpations: Abdomen is soft.     Tenderness: There is no abdominal tenderness. There is no right CVA tenderness, left CVA tenderness or guarding.  Skin:    General: Skin is warm and dry.  Neurological:     Mental Status: She is alert.  Psychiatric:        Mood and Affect: Mood normal.     ED Results / Procedures / Treatments   Labs (all labs ordered are listed, but only abnormal results are displayed) Labs Reviewed  CBC WITH DIFFERENTIAL/PLATELET  COMPREHENSIVE METABOLIC PANEL  LIPASE, BLOOD  URINALYSIS, ROUTINE W REFLEX MICROSCOPIC    EKG None  Radiology No results found.  Procedures Procedures (including critical care time)  Medications Ordered in ED Medications  alum & mag hydroxide-simeth (MAALOX/MYLANTA) 200-200-20 MG/5ML suspension 30 mL (30 mLs Oral Given 12/12/19 0914)    And  lidocaine (XYLOCAINE) 2 % viscous mouth solution 15 mL (15 mLs Oral Given 12/12/19 4010)    ED Course  I have reviewed the triage vital signs and the nursing notes.  Pertinent labs & imaging results that were available during my care of the patient were reviewed by me and considered in my medical decision making (see chart for details).  Clinical Course as of Dec 12 1006  Mon Dec 12, 2019  1007 Patient with abdominal pain. Normal physical exam and well appearing. Vitals are stable, no fever.  No signs of dehydration, tolerating PO fluids > 6 oz.  Lungs are clear.  No focal abdominal pain, no concern for appendicitis, cholecystitis, pancreatitis, ruptured viscus, UTI, kidney stone, or any other abdominal etiology.  Supportive therapy indicated with return if symptoms worsen.  Patient counseled.      [KM]    Clinical Course User Index [KM] Jeral Pinch   MDM Rules/Calculators/A&P                      Based on review of vitals, medical screening exam, lab work and/or imaging, there does not appear to be an acute, emergent etiology for the patient's symptoms. Counseled pt on good return precautions and encouraged both PCP and ED follow-up as needed.  Prior to discharge, I also discussed incidental imaging findings with patient in detail and advised appropriate, recommended follow-up in detail.  Clinical Impression: 1. Generalized abdominal pain     Disposition: Discharge  Prior to providing a prescription for a controlled substance, I independently reviewed the patient's recent prescription history on the [place] Controlled Substance Reporting  System. The patient had no recent or regular prescriptions and was deemed appropriate for a brief, less than 3 day prescription of narcotic for acute analgesia.  This note was prepared with assistance of Conservation officer, historic buildings. Occasional wrong-word or sound-a-like substitutions may have occurred due to the inherent limitations of voice recognition software.  Final Clinical Impression(s) / ED Diagnoses Final diagnoses:  Generalized abdominal pain    Rx / DC Orders ED Discharge Orders    None       Jeral Pinch 12/12/19 1008    Tegeler, Canary Brim, MD 12/12/19 1606

## 2019-12-13 ENCOUNTER — Emergency Department (HOSPITAL_COMMUNITY)
Admission: EM | Admit: 2019-12-13 | Discharge: 2019-12-13 | Disposition: A | Discharge status: Home / Self Care | Payer: Self-pay | Attending: Emergency Medicine | Admitting: Emergency Medicine

## 2019-12-13 ENCOUNTER — Encounter (HOSPITAL_COMMUNITY): Payer: Self-pay | Admitting: Student

## 2019-12-13 DIAGNOSIS — R0602 Shortness of breath: Secondary | ICD-10-CM

## 2019-12-13 MED ORDER — FLUTICASONE PROPIONATE 50 MCG/ACT NA SUSP: 1.0000 | Freq: Every day | NASAL | 0 refills | Status: AC

## 2019-12-13 NOTE — Discharge Instructions (Addendum)
You were seen in the emergency department today for trouble breathing.  Your chest x-ray was normal.  Your physical exam was reassuring.  We are sending you home with Flonase to use 1 spray per nostril daily as needed for nasal congestion.  We have prescribed you new medication(s) today. Discuss the medications prescribed today with your pharmacist as they can have adverse effects and interactions with your other medicines including over the counter and prescribed medications. Seek medical evaluation if you start to experience new or abnormal symptoms after taking one of these medicines, seek care immediately if you start to experience difficulty breathing, feeling of your throat closing, facial swelling, or rash as these could be indications of a more serious allergic reaction  Please follow-up with primary care and/or the [facility] within 1 week for reevaluation.  Return to the ER for new or worsening symptoms or any other concerns.

## 2019-12-13 NOTE — ED Notes (Signed)
When reassessing vital signs patient states "I stopped breathing while out in the lobby, see I am not breathing" Patient oxygen saturation 100%

## 2019-12-13 NOTE — ED Provider Notes (Signed)
[facility]-EMERGENCY DEPT Provider Note   CSN: [id number] Arrival date & time: 12/12/19  2228     History Chief Complaint  Patient presents with  . Shortness of Breath    Collin White is a 24 y.o. adult with a history of migraines, seizures, schizophrenia, and PTSD who presents to the ED with complaints of dyspnea since 3PM yesterday. Patient states dyspnea feels like he cannot breath at times with associated nasal congestion. No alleviating/aggravating factors. Denies fever, chills, chest pain, leg pain/swelling, hemoptysis, recent surgery/trauma, recent long travel, hormone use, personal hx of cancer, or hx of DVT/PE.      HPI     Past Medical History:  Diagnosis Date  . Migraines   . Psychoses (HCC)   . Schizophrenia (HCC)   . Seizures [facility])     Patient Active Problem List   Diagnosis Date Noted  . Schizophrenia (HCC) 11/23/2019  . Acute episode of schizophrenia with history of multiple episodes (HCC) 06/30/2017  . Schizophrenia, paranoid type (HCC) 06/08/2017  . 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 Psychoses (HCC)   . Paranoid schizophrenia (HCC) 09/29/2015  . Cannabis use disorder, moderate, in early remission (HCC) 09/26/2015  . History of posttraumatic stress disorder (PTSD) 09/26/2015    Past Surgical History:  Procedure Laterality Date  . abd surgery s/p traumatic event    . facial reconstructive surgery    . NO PAST SURGERIES  patient stated he had facial reconstruction surgery as a child   . tumor removed from head          Family History  Problem Relation Age of Onset  . Hypertension Other   . Mental illness Neg Hx     Social History   Tobacco Use  . Smoking status: Former Smoker    Types: Cigarettes  . Smokeless tobacco: Never Used  Substance Use Topics  . Alcohol use: No  . Drug use: No    Home Medications Prior to Admission medications   Medication Sig Start Date End Date Taking? Authorizing  Provider  alum & mag hydroxide-simeth (MAALOX ADVANCED MAX ST) 400-400-40 MG/5ML suspension Take 15 mLs by mouth every 6 (six) hours as needed for indigestion. 12/10/19   Nira Conn, MD  ARIPiprazole ER (ABILIFY MAINTENA) 400 MG SRER injection Inject 2 mLs (400 mg total) into the muscle every 28 (twenty-eight) days. Due 5/15 12/06/19   Malvin Johns, MD  benztropine (COGENTIN) 0.5 MG tablet Take 1 tablet (0.5 mg total) by mouth 2 (two) times daily. 12/06/19   Malvin Johns, MD  divalproex (DEPAKOTE) 500 MG DR tablet Take 1 tablet (500 mg total) by mouth at bedtime. Patient taking differently: Take 500 mg by mouth daily.  12/06/19   Malvin Johns, MD  pantoprazole (PROTONIX) 40 MG tablet Take 1 tablet (40 mg total) by mouth daily. 12/07/19   Malvin Johns, MD  risperiDONE (RISPERDAL) 3 MG tablet Take 2 tablets (6 mg total) by mouth at bedtime. 12/06/19   Malvin Johns, MD    Allergies    Patient has no known allergies.  Review of Systems   Review of Systems  Constitutional: Negative for chills and fever.  HENT: Positive for congestion. Negative for ear pain and sore throat.   Respiratory: Positive for shortness of breath. Negative for cough and wheezing.   Cardiovascular: Negative for chest pain.  Gastrointestinal: Negative for abdominal pain, diarrhea, nausea and vomiting.  Genitourinary: Negative for dysuria.  Musculoskeletal: Negative for myalgias.  Neurological: Negative for syncope.  Physical Exam Updated Vital Signs BP (!) 125/107 (BP Location: Left Arm)   Pulse 95   Temp 98 F (36.7 C) (Oral)   Resp 17   SpO2 100%   Physical Exam Vitals and nursing note reviewed.  Constitutional:      General: She is not in acute distress.    Appearance: She is well-developed.  HENT:     Head: Normocephalic and atraumatic.     Right Ear: Ear canal normal. Tympanic membrane is not perforated, erythematous, retracted or bulging.     Left Ear: Ear canal normal. Tympanic membrane is not  perforated, erythematous, retracted or bulging.     Ears:     Comments: No mastoid erythema/swelling/tenderness.     Nose:     Right Sinus: No maxillary sinus tenderness or frontal sinus tenderness.     Left Sinus: No maxillary sinus tenderness or frontal sinus tenderness.     Mouth/Throat:     Pharynx: Uvula midline. No oropharyngeal exudate or posterior oropharyngeal erythema.     Comments: Posterior oropharynx is symmetric appearing. Patient tolerating own secretions without difficulty. No trismus. No drooling. No hot potato voice. No swelling beneath the tongue, submandibular compartment is soft.  Eyes:     General:        Right eye: No discharge.        Left eye: No discharge.     Conjunctiva/sclera: Conjunctivae normal.     Pupils: Pupils are equal, round, and reactive to light.  Cardiovascular:     Rate and Rhythm: Normal rate and regular rhythm.     Heart sounds: No murmur.  Pulmonary:     Effort: Pulmonary effort is normal. No respiratory distress.     Breath sounds: Normal breath sounds. No wheezing, rhonchi or rales.  Abdominal:     General: There is no distension.     Palpations: Abdomen is soft.     Tenderness: There is no abdominal tenderness.  Musculoskeletal:     Cervical back: Normal range of motion and neck supple. No edema or rigidity.  Lymphadenopathy:     Cervical: No cervical adenopathy.  Skin:    General: Skin is warm and dry.     Findings: No rash.  Neurological:     Mental Status: She is alert.  Psychiatric:        Behavior: Behavior normal.     ED Results / Procedures / Treatments   Labs (all labs ordered are listed, but only abnormal results are displayed) Labs Reviewed - No data to display  EKG None  Radiology DG Chest 2 View  Result Date: 12/12/2019 CLINICAL DATA:  Shortness of breath. EXAM: CHEST - 2 VIEW COMPARISON:  Most recent radiograph 11/22/2019 FINDINGS: The cardiomediastinal contours are normal. The lungs are clear. Pulmonary  vasculature is normal. No consolidation, pleural effusion, or pneumothorax. No acute osseous abnormalities are seen. IMPRESSION: Negative radiographs of the chest. Electronically Signed   By: Keith Rake M.D.   On: 12/12/2019 23:45    Procedures Procedures (including critical care time)  Medications Ordered in ED Medications - No data to display  ED Course  I have reviewed the triage vital signs and the nursing notes.  Pertinent labs & imaging results that were available during my care of the patient were reviewed by me and considered in my medical decision making (see chart for details).    Theodis Kinsel was evaluated in Emergency Department on 12/13/2019 for the symptoms described in the history of present illness.  He/she was evaluated in the context of the global COVID-19 pandemic, which necessitated consideration that the patient might be at risk for infection with the SARS-CoV-2 virus that causes COVID-19. Institutional protocols and algorithms that pertain to the evaluation of patients at risk for COVID-19 are in a state of rapid change based on information released by regulatory bodies including the CDC and federal and state organizations. These policies and algorithms were followed during the patient's care in the ED.  MDM Rules/Calculators/A&P                      Patient presents to the ED with complaints of dyspnea & nasal congestion. Patient is nontoxic, vitals without significant abnormality, BP mildly elevated- doubt HTN emergency.  Patient is afebrile, no sinus tenderness, symptoms less than 7 days, doubt acute bacterial sinusitis.  No signs of AOM/AOE/mastoiditis.  Oropharynx is clear.  No meningismus.  Lungs are clear to auscultation bilaterally, chest x-ray without infiltrate, doubt pneumonia.  Chest x-ray is also without pneumothorax or fluid overload.  I personally reviewed and interpreted chest x-ray in agreement with radiologist interpretation.  Patient is low risk  [place]s, PERC negative, doubt pulmonary embolism.  Patient is not in any respiratory distress, SPO2 100% on room air.  Likely allergic versus viral.  Will treat with Flonase.  PCP follow-up. I discussed results, treatment plan, need for follow-up, and return precautions with the patient. Provided opportunity for questions, patient confirmed understanding and is in agreement with plan.   Final Clinical Impression(s) / ED Diagnoses Final diagnoses:  Shortness of breath    Rx / DC Orders ED Discharge Orders         Ordered    fluticasone (FLONASE) 50 MCG/ACT nasal spray  Daily     12/13/19 0741           Cherly Anderson, PA-C 12/13/19 0742    Lorre Nick, MD 12/14/19 1547

## 2019-12-18 ENCOUNTER — Other Ambulatory Visit: Payer: Self-pay

## 2019-12-18 ENCOUNTER — Encounter (HOSPITAL_COMMUNITY): Payer: Self-pay | Admitting: Emergency Medicine

## 2019-12-18 ENCOUNTER — Emergency Department (HOSPITAL_COMMUNITY)
Admission: EM | Admit: 2019-12-18 | Discharge: 2019-12-18 | Disposition: A | Discharge status: Home / Self Care | Payer: Medicaid Other | Attending: Emergency Medicine | Admitting: Emergency Medicine

## 2019-12-18 ENCOUNTER — Encounter (HOSPITAL_COMMUNITY): Payer: Self-pay

## 2019-12-18 DIAGNOSIS — Z87891 Personal history of nicotine dependence: Secondary | ICD-10-CM | POA: Insufficient documentation

## 2019-12-18 DIAGNOSIS — Z5321 Procedure and treatment not carried out due to patient leaving prior to being seen by health care provider: Secondary | ICD-10-CM | POA: Insufficient documentation

## 2019-12-18 DIAGNOSIS — F640 Transsexualism: Secondary | ICD-10-CM | POA: Insufficient documentation

## 2019-12-18 DIAGNOSIS — R1013 Epigastric pain: Secondary | ICD-10-CM | POA: Insufficient documentation

## 2019-12-18 DIAGNOSIS — Z79899 Other long term (current) drug therapy: Secondary | ICD-10-CM | POA: Insufficient documentation

## 2019-12-18 DIAGNOSIS — R4789 Other speech disturbances: Secondary | ICD-10-CM | POA: Insufficient documentation

## 2019-12-18 DIAGNOSIS — R4183 Borderline intellectual functioning: Secondary | ICD-10-CM | POA: Insufficient documentation

## 2019-12-18 DIAGNOSIS — R109 Unspecified abdominal pain: Secondary | ICD-10-CM

## 2019-12-18 DIAGNOSIS — M791 Myalgia, unspecified site: Secondary | ICD-10-CM | POA: Insufficient documentation

## 2019-12-18 DIAGNOSIS — M79606 Pain in leg, unspecified: Secondary | ICD-10-CM | POA: Insufficient documentation

## 2019-12-18 MED ORDER — IBUPROFEN 200 MG PO TABS
600.0000 mg | ORAL_TABLET | Freq: Once | ORAL | Status: AC
  Administered 2019-12-18: 14:00:00 600 mg via ORAL
  Filled 2019-12-18: qty 3

## 2019-12-18 MED ORDER — DICYCLOMINE HCL 10 MG PO CAPS
10.0000 mg | ORAL_CAPSULE | Freq: Once | ORAL | Status: AC
  Administered 2019-12-18: 10 mg via ORAL
  Filled 2019-12-18: qty 1

## 2019-12-18 NOTE — ED Provider Notes (Signed)
[facility]-EMER[facility]PT Provider Note   CSN: [id number] Arrival date & time: 12/18/19  1246     History Chief Complaint  Patient presents with  . Abdominal Pain    Tali Coster is a 24 y.o. adult history of schizophrenia, homelessness, frequent ED visits, who is a very poor historian, presented ED with multiple complaints.  She initially reported she was having pain in her legs from walking all day and night.  However on my exam the patient reports diffuse abdominal pain and makes no mention of leg pain.  It is difficult to obtain further reliable history.  The patient does have tangential conversation which has been noted on multiple prior Ed visits.  Prefers male pronouns  HPI     Past Medical History:  Diagnosis Date  . Migraines   . Psychoses (HCC)   . Schizophrenia (HCC)   . Seizures [facility])     Patient Active Problem List   Diagnosis Date Noted  . Schizophrenia (HCC) 11/23/2019  . Acute episode of schizophrenia with history of multiple episodes (HCC) 06/30/2017  . Schizophrenia, paranoid type (HCC) 06/08/2017  . 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 Psychoses (HCC)   . Paranoid schizophrenia (HCC) 09/29/2015  . Cannabis use disorder, moderate, in early remission (HCC) 09/26/2015  . History of posttraumatic stress disorder (PTSD) 09/26/2015    Past Surgical History:  Procedure Laterality Date  . abd surgery s/p traumatic event    . facial reconstructive surgery    . NO PAST SURGERIES  patient stated he had facial reconstruction surgery as a child   . tumor removed from head          Family History  Problem Relation Age of Onset  . Hypertension Other   . Mental illness Neg Hx     Social History   Tobacco Use  . Smoking status: Former Smoker    Types: Cigarettes  . Smokeless tobacco: Never Used  Substance Use Topics  . Alcohol use: No  . Drug use: No    Home Medications Prior to Admission medications    Medication Sig Start Date End Date Taking? Authorizing Provider  alum & mag hydroxide-simeth (MAALOX ADVANCED MAX ST) 400-400-40 MG/5ML suspension Take 15 mLs by mouth every 6 (six) hours as needed for indigestion. 12/10/19   Nira Conn, MD  ARIPiprazole ER (ABILIFY MAINTENA) 400 MG SRER injection Inject 2 mLs (400 mg total) into the muscle every 28 (twenty-eight) days. Due 5/15 12/06/19   Malvin Johns, MD  benztropine (COGENTIN) 0.5 MG tablet Take 1 tablet (0.5 mg total) by mouth 2 (two) times daily. 12/06/19   Malvin Johns, MD  divalproex (DEPAKOTE) 500 MG DR tablet Take 1 tablet (500 mg total) by mouth at bedtime. Patient taking differently: Take 500 mg by mouth daily.  12/06/19   Malvin Johns, MD  fluticasone (FLONASE) 50 MCG/ACT nasal spray Place 1 spray into both nostrils daily. 12/13/19   Petrucelli, Samantha R, PA-C  pantoprazole (PROTONIX) 40 MG tablet Take 1 tablet (40 mg total) by mouth daily. 12/07/19   Malvin Johns, MD  risperiDONE (RISPERDAL) 3 MG tablet Take 2 tablets (6 mg total) by mouth at bedtime. 12/06/19   Malvin Johns, MD    Allergies    Patient has no known allergies.  Review of Systems   Review of Systems  Constitutional: Negative for chills and fever.  HENT: Negative for ear pain and sore throat.   Eyes: Negative for pain and visual disturbance.  Respiratory:  Negative for cough and shortness of breath.   Cardiovascular: Negative for chest pain and palpitations.  Gastrointestinal: Positive for abdominal pain. Negative for nausea and vomiting.  Genitourinary: Negative for dysuria and hematuria.  Musculoskeletal: Positive for arthralgias and myalgias.  Skin: Negative for color change and rash.  Neurological: Negative for syncope and light-headedness.  All other systems reviewed and are negative.   Physical Exam Updated Vital Signs BP 114/72   Pulse 78   Temp 98.9 F (37.2 C) (Oral)   Resp 18   SpO2 92%   Physical Exam Vitals and nursing note  reviewed.  Constitutional:      Appearance: She is well-developed.  HENT:     Head: Normocephalic and atraumatic.  Eyes:     Conjunctiva/sclera: Conjunctivae normal.  Cardiovascular:     Rate and Rhythm: Normal rate and regular rhythm.     Pulses: Normal pulses.  Pulmonary:     Effort: Pulmonary effort is normal. No respiratory distress.     Breath sounds: Normal breath sounds.  Abdominal:     General: There is no distension.     Palpations: Abdomen is soft. There is no mass.     Tenderness: There is no abdominal tenderness. There is no guarding.  Musculoskeletal:     Cervical back: Neck supple.  Skin:    General: Skin is warm and dry.  Neurological:     General: No focal deficit present.     Mental Status: She is alert and oriented to person, place, and time.  Psychiatric:     Comments: Flat affect No SI     ED Results / Procedures / Treatments   Labs (all labs ordered are listed, but only abnormal results are displayed) Labs Reviewed - No data to display  EKG None  Radiology No results found.  Procedures Procedures (including critical care time)  Medications Ordered in ED Medications  ibuprofen (ADVIL) tablet 600 mg (600 mg Oral Given 12/18/19 1350)  dicyclomine (BENTYL) capsule 10 mg (10 mg Oral Given 12/18/19 1351)    ED Course  I have reviewed the triage vital signs and the nursing notes.  Pertinent labs & imaging results that were available during my care of the patient were reviewed by me and considered in my medical decision making (see chart for details).  This is a 24 year old MTF patient presenting with multiple complaints.   She has been seen many times in our ED on a near-daily basis for a variety of medical and psychiatric issues.  She last had bloodwork 1 week ago on 12/12/19 with a normal CMP, normal CBC, normal Lipase, and normal UA.  Do not believe she needs repeat labs at this time.  She has a benign abdominal exam.  She describing some cramping  epigastric gastric pain.  We will try some Bentyl.  We can also try some Motrin for the myalgias she is complaining about.  We will give her some fluid here.  Anticipate discharge.  I do not suspect she has an acute medical emergency.  Clinical Course as of Dec 18 1730  Sun Dec 18, 2019  1454 On reassessment, she is feeling somewhat better.  Will discharge   [MT]    Clinical Course User Index [MT] Oswin Griffith, Carola Rhine, MD    Final Clinical Impression(s) / ED Diagnoses Final diagnoses:  Abdominal pain, unspecified abdominal location  Myalgia    Rx / DC Orders ED Discharge Orders    None       Savanah Bayles,  Kermit Balo, MD 12/18/19 [phone]

## 2019-12-18 NOTE — ED Triage Notes (Signed)
Pt reports leg pain from walking today.

## 2019-12-22 ENCOUNTER — Emergency Department (HOSPITAL_COMMUNITY)
Admission: EM | Admit: 2019-12-22 | Discharge: 2019-12-22 | Disposition: A | Discharge status: Home / Self Care | Payer: Medicaid Other | Attending: Emergency Medicine | Admitting: Emergency Medicine

## 2019-12-22 ENCOUNTER — Other Ambulatory Visit: Payer: Self-pay

## 2019-12-22 ENCOUNTER — Encounter (HOSPITAL_COMMUNITY): Payer: Self-pay

## 2019-12-22 DIAGNOSIS — R531 Weakness: Secondary | ICD-10-CM | POA: Insufficient documentation

## 2019-12-22 DIAGNOSIS — Z87891 Personal history of nicotine dependence: Secondary | ICD-10-CM | POA: Insufficient documentation

## 2019-12-22 DIAGNOSIS — K649 Unspecified hemorrhoids: Secondary | ICD-10-CM | POA: Insufficient documentation

## 2019-12-22 DIAGNOSIS — Z79899 Other long term (current) drug therapy: Secondary | ICD-10-CM | POA: Insufficient documentation

## 2019-12-22 LAB — CBC WITH DIFFERENTIAL/PLATELET
Abs Immature Granulocytes: 0.02 10*3/uL (ref 0.00–0.07)
Basophils Absolute: 0 10*3/uL (ref 0.0–0.1)
Basophils Relative: 0 %
Eosinophils Absolute: 0.2 10*3/uL (ref 0.0–0.5)
Eosinophils Relative: 2 %
HCT: 41.4 % (ref 39.0–52.0)
Hemoglobin: 13.5 g/dL (ref 13.0–17.0)
Immature Granulocytes: 0 %
Lymphocytes Relative: 23 %
Lymphs Abs: 1.7 10*3/uL (ref 0.7–4.0)
MCH: 29.4 pg (ref 26.0–34.0)
MCHC: 32.6 g/dL (ref 30.0–36.0)
MCV: 90.2 fL (ref 80.0–100.0)
Monocytes Absolute: 0.6 10*3/uL (ref 0.1–1.0)
Monocytes Relative: 9 %
Neutro Abs: 4.9 10*3/uL (ref 1.7–7.7)
Neutrophils Relative %: 66 %
Platelets: 185 10*3/uL (ref 150–400)
RBC: 4.59 MIL/uL (ref 4.22–5.81)
RDW: 13.9 % (ref 11.5–15.5)
WBC: 7.4 10*3/uL (ref 4.0–10.5)
nRBC: 0 % (ref 0.0–0.2)

## 2019-12-22 LAB — I-STAT CHEM 8, ED
BUN: 26 mg/dL — ABNORMAL HIGH (ref 6–20)
Calcium, Ion: 1.22 mmol/L (ref 1.15–1.40)
Chloride: 104 mmol/L (ref 98–111)
Creatinine, Ser: 1 mg/dL (ref 0.61–1.24)
Glucose, Bld: 83 mg/dL (ref 70–99)
HCT: 41 % (ref 39.0–52.0)
Hemoglobin: 13.9 g/dL (ref 13.0–17.0)
Potassium: 3.8 mmol/L (ref 3.5–5.1)
Sodium: 141 mmol/L (ref 135–145)
TCO2: 24 mmol/L (ref 22–32)

## 2019-12-22 LAB — POC OCCULT BLOOD, ED: Fecal Occult Bld: POSITIVE — AB

## 2019-12-22 MED ORDER — ALUM & MAG HYDROXIDE-SIMETH 200-200-20 MG/5ML PO SUSP
30.0000 mL | Freq: Once | ORAL | Status: AC
  Administered 2019-12-22: 30 mL via ORAL
  Filled 2019-12-22: qty 30

## 2019-12-22 NOTE — ED Triage Notes (Signed)
Pt sts his blood feels weak

## 2019-12-22 NOTE — ED Triage Notes (Addendum)
Pt states he is having generalized abdominal pain and having blood in stool so he went to the store to buy some milk. Multiple other complaints.

## 2019-12-22 NOTE — ED Provider Notes (Signed)
[facility]-EMERGENCY DEPT Provider Note   CSN: [id number] Arrival date & time: 12/22/19  0444     History Chief Complaint  Patient presents with  . Abdominal Pain    Collin White is a 24 y.o. adult.  The history is provided by the patient.  Abdominal Pain Pain location:  Epigastric Pain quality: not aching   Pain radiates to:  Does not radiate Pain severity:  Moderate Onset quality:  Gradual Timing:  Constant Progression:  Unchanged Chronicity:  Recurrent Context: not alcohol use, not awakening from sleep, not diet changes, not eating, not laxative use, not retching and not trauma   Relieved by:  Nothing Worsened by:  Nothing Ineffective treatments:  None tried Associated symptoms: no anorexia, no belching, no chest pain, no chills, no constipation, no cough, no diarrhea and no vomiting   Associated symptoms comment:  Blood when wiping  Risk factors: no alcohol abuse        Past Medical History:  Diagnosis Date  . Migraines   . Psychoses (HCC)   . Schizophrenia (HCC)   . Seizures [facility])     Patient Active Problem List   Diagnosis Date Noted  . Schizophrenia (HCC) 11/23/2019  . Acute episode of schizophrenia with history of multiple episodes (HCC) 06/30/2017  . Schizophrenia, paranoid type (HCC) 06/08/2017  . 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 Psychoses (HCC)   . Paranoid schizophrenia (HCC) 09/29/2015  . Cannabis use disorder, moderate, in early remission (HCC) 09/26/2015  . History of posttraumatic stress disorder (PTSD) 09/26/2015    Past Surgical History:  Procedure Laterality Date  . abd surgery s/p traumatic event    . facial reconstructive surgery    . NO PAST SURGERIES  patient stated he had facial reconstruction surgery as a child   . tumor removed from head          Family History  Problem Relation Age of Onset  . Hypertension Other   . Mental illness Neg Hx     Social History   Tobacco Use  .  Smoking status: Former Smoker    Types: Cigarettes  . Smokeless tobacco: Never Used  Substance Use Topics  . Alcohol use: No  . Drug use: No    Home Medications Prior to Admission medications   Medication Sig Start Date End Date Taking? Authorizing Provider  alum & mag hydroxide-simeth (MAALOX ADVANCED MAX ST) 400-400-40 MG/5ML suspension Take 15 mLs by mouth every 6 (six) hours as needed for indigestion. 12/10/19   Nira Conn, MD  ARIPiprazole ER (ABILIFY MAINTENA) 400 MG SRER injection Inject 2 mLs (400 mg total) into the muscle every 28 (twenty-eight) days. Due 5/15 12/06/19   Malvin Johns, MD  benztropine (COGENTIN) 0.5 MG tablet Take 1 tablet (0.5 mg total) by mouth 2 (two) times daily. 12/06/19   Malvin Johns, MD  divalproex (DEPAKOTE) 500 MG DR tablet Take 1 tablet (500 mg total) by mouth at bedtime. Patient taking differently: Take 500 mg by mouth daily.  12/06/19   Malvin Johns, MD  fluticasone (FLONASE) 50 MCG/ACT nasal spray Place 1 spray into both nostrils daily. 12/13/19   Petrucelli, Samantha R, PA-C  pantoprazole (PROTONIX) 40 MG tablet Take 1 tablet (40 mg total) by mouth daily. 12/07/19   Malvin Johns, MD  risperiDONE (RISPERDAL) 3 MG tablet Take 2 tablets (6 mg total) by mouth at bedtime. 12/06/19   Malvin Johns, MD    Allergies    Patient has no known allergies.  Review of Systems   Review of Systems  Constitutional: Negative for chills.  HENT: Negative for congestion.   Eyes: Negative for visual disturbance.  Respiratory: Negative for cough.   Cardiovascular: Negative for chest pain.  Gastrointestinal: Positive for abdominal pain. Negative for anorexia, constipation, diarrhea and vomiting.  Genitourinary: Negative for difficulty urinating.  Musculoskeletal: Negative for arthralgias.  Skin: Negative for rash.  Neurological: Negative for dizziness.  Psychiatric/Behavioral: Negative for agitation.  All other systems reviewed and are negative.   Physical  Exam Updated Vital Signs BP (!) 133/99 (BP Location: Left Arm)   Pulse 77   Temp (!) 96.9 F (36.1 C) (Oral)   Resp 18   Ht 6\' 1"  (1.854 m)   Wt 63.5 kg   SpO2 100%   BMI 18.47 kg/m   Physical Exam Vitals and nursing note reviewed.  Constitutional:      General: She is not in acute distress.    Appearance: Normal appearance.  HENT:     Head: Normocephalic and atraumatic.     Nose: Nose normal.  Eyes:     Conjunctiva/sclera: Conjunctivae normal.     Pupils: Pupils are equal, round, and reactive to light.  Cardiovascular:     Rate and Rhythm: Normal rate and regular rhythm.     Pulses: Normal pulses.     Heart sounds: Normal heart sounds.  Pulmonary:     Effort: Pulmonary effort is normal.     Breath sounds: Normal breath sounds.  Abdominal:     General: Abdomen is flat. Bowel sounds are normal.     Tenderness: There is no abdominal tenderness. There is no guarding.  Genitourinary:    Comments: Chaperone present, patient has internal hemorrhoid  Musculoskeletal:        General: Normal range of motion.     Cervical back: Normal range of motion and neck supple.  Skin:    General: Skin is warm and dry.     Capillary Refill: Capillary refill takes less than 2 seconds.  Neurological:     General: No focal deficit present.     Mental Status: She is alert and oriented to person, place, and time.     Deep Tendon Reflexes: Reflexes normal.  Psychiatric:        Mood and Affect: Mood normal.        Behavior: Behavior normal.     ED Results / Procedures / Treatments   Labs (all labs ordered are listed, but only abnormal results are displayed) Results for orders placed or performed during the hospital encounter of 12/22/19  CBC with Differential/Platelet  Result Value Ref Range   WBC 7.4 4.0 - 10.5 K/uL   RBC 4.59 4.22 - 5.81 MIL/uL   Hemoglobin 13.5 13.0 - 17.0 g/dL   HCT 02/21/20 34.7 - 42.5 %   MCV 90.2 80.0 - 100.0 fL   MCH 29.4 26.0 - 34.0 pg   MCHC 32.6 30.0 - 36.0  g/dL   RDW 95.6 38.7 - 56.4 %   Platelets 185 150 - 400 K/uL   nRBC 0.0 0.0 - 0.2 %   Neutrophils Relative % 66 %   Neutro Abs 4.9 1.7 - 7.7 K/uL   Lymphocytes Relative 23 %   Lymphs Abs 1.7 0.7 - 4.0 K/uL   Monocytes Relative 9 %   Monocytes Absolute 0.6 0.1 - 1.0 K/uL   Eosinophils Relative 2 %   Eosinophils Absolute 0.2 0.0 - 0.5 K/uL   Basophils Relative 0 %  Basophils Absolute 0.0 0.0 - 0.1 K/uL   Immature Granulocytes 0 %   Abs Immature Granulocytes 0.02 0.00 - 0.07 K/uL  I-stat chem 8, ED (not at [facility] or [facility])  Result Value Ref Range   Sodium 141 135 - 145 mmol/L   Potassium 3.8 3.5 - 5.1 mmol/L   Chloride 104 98 - 111 mmol/L   BUN 26 (H) 6 - 20 mg/dL   Creatinine, Ser 1.00 0.61 - 1.24 mg/dL   Glucose, Bld 83 70 - 99 mg/dL   Calcium, Ion 1.22 1.15 - 1.40 mmol/L   TCO2 24 22 - 32 mmol/L   Hemoglobin 13.9 13.0 - 17.0 g/dL   HCT 41.0 39.0 - 52.0 %  POC occult blood, ED  Result Value Ref Range   Fecal Occult Bld POSITIVE (A) NEGATIVE   DG Chest 2 View  Result Date: 12/12/2019 CLINICAL DATA:  Shortness of breath. EXAM: CHEST - 2 VIEW COMPARISON:  Most recent radiograph 11/22/2019 FINDINGS: The cardiomediastinal contours are normal. The lungs are clear. Pulmonary vasculature is normal. No consolidation, pleural effusion, or pneumothorax. No acute osseous abnormalities are seen. IMPRESSION: Negative radiographs of the chest. Electronically Signed   By: Keith Rake M.D.   On: 12/12/2019 23:45   DG Chest Port 1 View  Result Date: 11/22/2019 CLINICAL DATA:  Cough EXAM: PORTABLE CHEST 1 VIEW COMPARISON:  09/30/2018 FINDINGS: The heart size and mediastinal contours are within normal limits. Both lungs are clear. The visualized skeletal structures are unremarkable. IMPRESSION: Normal study. Electronically Signed   By: Rolm Baptise M.D.   On: 11/22/2019 21:25    None  Radiology No results found.  Procedures Procedures (including critical care time)  Medications Ordered  in ED Medications  alum & mag hydroxide-simeth (MAALOX/MYLANTA) 200-200-20 MG/5ML suspension 30 mL (30 mLs Oral Given 12/22/19 0600)    ED Course  I have reviewed the triage vital signs and the nursing notes.  Pertinent labs & imaging results that were available during my care of the patient were reviewed by me and considered in my medical decision making (see chart for details).    MDM Rules/Calculators/A&P                      No change in hemoglobin.  Patient has a hemorrhoid and is suspect that given the history that is the source of bleeding.  Abdominal exam is benign and reassuring.  Start miralax therapy.    Collin White was evaluated in Emergency Department on 12/22/2019 for the symptoms described in the history of present illness. She was evaluated in the context of the global COVID-19 pandemic, which necessitated consideration that the patient might be at risk for infection with the SARS-CoV-2 virus that causes COVID-19. Institutional protocols and algorithms that pertain to the evaluation of patients at risk for COVID-19 are in a state of rapid change based on information released by regulatory bodies including the CDC and federal and state organizations. These policies and algorithms were followed during the patient's care in the ED.   Rx / DC Orders Return for weakness, numbness, changes in vision or speech, fevers >100.4 unrelieved by medication, shortness of breath, intractable vomiting, or diarrhea, abdominal pain, Inability to tolerate liquids or food, cough, altered mental status or any concerns. No signs of systemic illness or infection. The patient is nontoxic-appearing on exam and vital signs are within normal limits.   I have reviewed the triage vital signs and the nursing notes. Pertinent labs &imaging  results that were available during my care of the patient were reviewed by me and considered in my medical decision making (see chart for details).  After history,  exam, and medical workup I feel the patient has been appropriately medically screened and is safe for discharge home. Pertinent diagnoses were discussed with the patient. Patient was givenstrictreturn precautions.   Zachary Nole, MD 12/22/19 [phone]

## 2019-12-22 NOTE — Discharge Instructions (Addendum)
Start miralax therapy

## 2019-12-23 ENCOUNTER — Emergency Department (HOSPITAL_COMMUNITY)
Admission: EM | Admit: 2019-12-23 | Discharge: 2019-12-23 | Disposition: A | Discharge status: Home / Self Care | Payer: Medicaid Other | Attending: Emergency Medicine | Admitting: Emergency Medicine

## 2019-12-23 DIAGNOSIS — R531 Weakness: Secondary | ICD-10-CM

## 2019-12-23 MED ORDER — ALUM & MAG HYDROXIDE-SIMETH 200-200-20 MG/5ML PO SUSP
30.0000 mL | Freq: Once | ORAL | Status: AC
  Administered 2019-12-23: 30 mL via ORAL
  Filled 2019-12-23: qty 30

## 2019-12-23 NOTE — Discharge Instructions (Addendum)
1. Medications: usual home medications 2. Treatment: rest, drink plenty of fluids,  3. Follow Up: Please followup with your primary doctor in 2-3 days for discussion of your diagnoses and further evaluation after today's visit; if you do not have a primary care doctor use the resource guide provided to find one; Please return to the ER for new or worsening symptoms  

## 2019-12-23 NOTE — ED Provider Notes (Signed)
[facility]-EMERGENCY DEPT Provider Note   CSN: [id number] Arrival date & time: 12/22/19  2242     History Chief Complaint  Patient presents with  . "Weak blood"    Collin White is a 24 y.o. adult with a hx of migraines, psychosis, schizophrenia, seizures presents to the Emergency Department complaining of gradual, persistent, progressively worsening generalized weakness onset around 8 PM.  Records were reviewed.  Patient was seen earlier today for rectal bleeding and was found to have bleeding hemorrhoids.  Labs obtained less than 24 hours ago are reassuring without signs of anemia.  Patient reports he has some mild chest discomfort but no other symptoms.  He reports he has not had much to eat today.  No other complaints.  Denies fever, chills, headache, neck pain, neck stiffness, abdominal pain, nausea, vomiting, diarrhea, weakness, dizziness, syncope, cough, shortness of breath.  Patient denies known sick contacts and known Covid contacts.  He has no other complaints.   The history is provided by the patient and medical records. No language interpreter was used.       Past Medical History:  Diagnosis Date  . Migraines   . Psychoses (HCC)   . Schizophrenia (HCC)   . Seizures [facility])     Patient Active Problem List   Diagnosis Date Noted  . Schizophrenia (HCC) 11/23/2019  . Acute episode of schizophrenia with history of multiple episodes (HCC) 06/30/2017  . Schizophrenia, paranoid type (HCC) 06/08/2017  . 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 Psychoses (HCC)   . Paranoid schizophrenia (HCC) 09/29/2015  . Cannabis use disorder, moderate, in early remission (HCC) 09/26/2015  . History of posttraumatic stress disorder (PTSD) 09/26/2015    Past Surgical History:  Procedure Laterality Date  . abd surgery s/p traumatic event    . facial reconstructive surgery    . NO PAST SURGERIES  patient stated he had facial reconstruction surgery as a child    . tumor removed from head          Family History  Problem Relation Age of Onset  . Hypertension Other   . Mental illness Neg Hx     Social History   Tobacco Use  . Smoking status: Former Smoker    Types: Cigarettes  . Smokeless tobacco: Never Used  Substance Use Topics  . Alcohol use: No  . Drug use: No    Home Medications Prior to Admission medications   Medication Sig Start Date End Date Taking? Authorizing Provider  alum & mag hydroxide-simeth (MAALOX ADVANCED MAX ST) 400-400-40 MG/5ML suspension Take 15 mLs by mouth every 6 (six) hours as needed for indigestion. 12/10/19  Yes Cardama, Amadeo Garnet, MD  ARIPiprazole ER (ABILIFY MAINTENA) 400 MG SRER injection Inject 2 mLs (400 mg total) into the muscle every 28 (twenty-eight) days. Due 5/15 12/06/19  Yes Malvin Johns, MD  benztropine (COGENTIN) 0.5 MG tablet Take 1 tablet (0.5 mg total) by mouth 2 (two) times daily. 12/06/19  Yes Malvin Johns, MD  divalproex (DEPAKOTE) 500 MG DR tablet Take 1 tablet (500 mg total) by mouth at bedtime. Patient taking differently: Take 500 mg by mouth daily.  12/06/19  Yes Malvin Johns, MD  fluticasone [name]) 50 MCG/ACT nasal spray Place 1 spray into both nostrils daily. 12/13/19  Yes Petrucelli, Samantha R, PA-C  pantoprazole (PROTONIX) 40 MG tablet Take 1 tablet (40 mg total) by mouth daily. 12/07/19  Yes Malvin Johns, MD  risperiDONE (RISPERDAL) 3 MG tablet Take 2 tablets (6 mg  total) by mouth at bedtime. 12/06/19  Yes Johnn Hai, MD    Allergies    Patient has no known allergies.  Review of Systems   Review of Systems  Constitutional: Negative for appetite change, diaphoresis, fatigue, fever and unexpected weight change.  HENT: Negative for mouth sores.   Eyes: Negative for visual disturbance.  Respiratory: Negative for cough, chest tightness, shortness of breath and wheezing.   Cardiovascular: Negative for chest pain.  Gastrointestinal: Negative for abdominal pain, constipation,  diarrhea, nausea and vomiting.  Endocrine: Negative for polydipsia, polyphagia and polyuria.  Genitourinary: Negative for dysuria, frequency, hematuria and urgency.  Musculoskeletal: Negative for back pain and neck stiffness.  Skin: Negative for rash.  Allergic/Immunologic: Negative for immunocompromised state.  Neurological: Positive for weakness. Negative for syncope, light-headedness and headaches.  Hematological: Does not bruise/bleed easily.  Psychiatric/Behavioral: Negative for sleep disturbance. The patient is not nervous/anxious.     Physical Exam Updated Vital Signs BP 130/87 (BP Location: Right Arm)   Pulse 92   Temp 97.8 F (36.6 C) (Oral)   Resp 18   Ht 6\' 1"  (1.854 m)   Wt 63.5 kg   SpO2 97%   BMI 18.47 kg/m   Physical Exam Vitals and nursing note reviewed.  Constitutional:      General: She is not in acute distress.    Appearance: She is not diaphoretic.  HENT:     Head: Normocephalic.  Eyes:     General: No scleral icterus.    Conjunctiva/sclera: Conjunctivae normal.  Cardiovascular:     Rate and Rhythm: Normal rate and regular rhythm.     Pulses: Normal pulses.          Radial pulses are 2+ on the right side and 2+ on the left side.  Pulmonary:     Effort: No tachypnea, accessory muscle usage, prolonged expiration, respiratory distress or retractions.     Breath sounds: No stridor.     Comments: Equal chest rise. No increased work of breathing. Abdominal:     General: There is no distension.     Palpations: Abdomen is soft.     Tenderness: There is no abdominal tenderness. There is no guarding or rebound.  Musculoskeletal:     Cervical back: Normal range of motion.     Comments: Moves all extremities equally and without difficulty.  Skin:    General: Skin is warm and dry.     Capillary Refill: Capillary refill takes less than 2 seconds.  Neurological:     Mental Status: She is alert.     GCS: GCS eye subscore is 4. GCS verbal subscore is 5. GCS  motor subscore is 6.     Comments: Speech is clear and goal oriented. Moves all extremities without ataxia.  Strength 5/5 in the bilateral upper and lower extremities.  Normal gait.  Psychiatric:        Mood and Affect: Mood normal.     ED Results / Procedures / Treatments    EKG EKG Interpretation  Date/Time:  Thursday Dec 22 2019 22:50:20 EDT Ventricular Rate:  93 PR Interval:    QRS Duration: 75 QT Interval:  323 QTC Calculation: 402 R Axis:   84 Text Interpretation: Sinus rhythm No significant change was found Confirmed by Shanon Rosser [phone]) on 12/23/2019 12:01:29 AM   Radiology No results found.  Procedures Procedures (including critical care time)  Medications Ordered in ED Medications  alum & mag hydroxide-simeth (MAALOX/MYLANTA) 200-200-20 MG/5ML suspension 30 mL (30 mLs  Oral Given 12/23/19 0223)    ED Course  I have reviewed the triage vital signs and the nursing notes.  Pertinent labs & imaging results that were available during my care of the patient were reviewed by me and considered in my medical decision making (see chart for details).    MDM Rules/Calculators/A&P                       Complaints with generalized weakness and some discomfort in her chest.  Orthostatic vital signs are without orthostasis.  EKG shows sinus rhythm.  Labs gathered less than 24 hours ago show no evidence of anemia, leukocytosis or electrolyte abnormalities.  Orthostatic VS for the past 24 hrs:  BP- Lying Pulse- Lying BP- Sitting Pulse- Sitting BP- Standing at 0 minutes Pulse- Standing at 0 minutes  12/23/19 0114 (!) 123/103 95 124/86 93 121/84 98    2:00 AM Patient given GI cocktail and p.o. intake with some improvement in symptoms.  No vomiting here in the emergency department; abdomen remains soft and nontender.  Patient resting comfortably and in no distress.  Vital signs remained within normal limits.  No evidence of systemic infection.  Patient afebrile without  tachycardia or hypotension.  BP 130/87 (BP Location: Right Arm)   Pulse 92   Temp 97.8 F (36.6 C) (Oral)   Resp 18   Ht 6\' 1"  (1.854 m)   Wt 63.5 kg   SpO2 97%   BMI 18.47 kg/m    Final Clinical Impression(s) / ED Diagnoses Final diagnoses:  Generalized weakness    Rx / DC Orders ED Discharge Orders    None       12/23/19 0250    M[place]us, 02/22/20, MD 12/23/19 [phone]

## 2019-12-26 ENCOUNTER — Encounter (HOSPITAL_COMMUNITY): Payer: Self-pay

## 2019-12-26 ENCOUNTER — Other Ambulatory Visit: Payer: Self-pay

## 2019-12-26 ENCOUNTER — Emergency Department (HOSPITAL_COMMUNITY)
Admission: EM | Admit: 2019-12-26 | Discharge: 2019-12-26 | Disposition: A | Discharge status: Home / Self Care | Payer: Medicaid Other | Attending: Emergency Medicine | Admitting: Emergency Medicine

## 2019-12-26 DIAGNOSIS — Z046 Encounter for general psychiatric examination, requested by authority: Secondary | ICD-10-CM | POA: Insufficient documentation

## 2019-12-26 DIAGNOSIS — Z5321 Procedure and treatment not carried out due to patient leaving prior to being seen by health care provider: Secondary | ICD-10-CM | POA: Insufficient documentation

## 2019-12-26 MED ORDER — SODIUM CHLORIDE 0.9% FLUSH: 3.0000 mL | Freq: Once | INTRAVENOUS | Status: DC

## 2019-12-26 NOTE — ED Triage Notes (Signed)
Registration put mental evalation by patient's name.  When triae started the patient states he has "starving pain after he eats" and points and pubs his abdomen. Patient asked several times if he was here for a psych evaluation and patient has said no each time.

## 2019-12-30 ENCOUNTER — Other Ambulatory Visit: Payer: Self-pay

## 2019-12-30 ENCOUNTER — Emergency Department (HOSPITAL_COMMUNITY)
Admission: EM | Admit: 2019-12-30 | Discharge: 2019-12-30 | Disposition: A | Discharge status: Home / Self Care | Payer: Medicaid Other | Attending: Emergency Medicine | Admitting: Emergency Medicine

## 2019-12-30 DIAGNOSIS — Z5321 Procedure and treatment not carried out due to patient leaving prior to being seen by health care provider: Secondary | ICD-10-CM | POA: Insufficient documentation

## 2019-12-30 DIAGNOSIS — R0789 Other chest pain: Secondary | ICD-10-CM | POA: Insufficient documentation

## 2019-12-30 NOTE — ED Triage Notes (Signed)
Pt c/o bilat rib pain denies injury or SOB

## 2020-01-20 ENCOUNTER — Other Ambulatory Visit: Payer: Self-pay

## 2020-01-20 ENCOUNTER — Emergency Department (HOSPITAL_COMMUNITY)
Admission: EM | Admit: 2020-01-20 | Discharge: 2020-01-20 | Disposition: A | Discharge status: Home / Self Care | Payer: Medicaid Other | Attending: Emergency Medicine | Admitting: Emergency Medicine

## 2020-01-20 ENCOUNTER — Encounter (HOSPITAL_COMMUNITY): Payer: Self-pay

## 2020-01-20 DIAGNOSIS — R251 Tremor, unspecified: Secondary | ICD-10-CM | POA: Insufficient documentation

## 2020-01-20 DIAGNOSIS — Z5321 Procedure and treatment not carried out due to patient leaving prior to being seen by health care provider: Secondary | ICD-10-CM | POA: Insufficient documentation

## 2020-01-20 NOTE — ED Triage Notes (Signed)
Patient c/o soreness and tremors around 1400 today and it lasted x 15 minutes.
